# Patient Record
Sex: Male | Born: 1935
Health system: Southern US, Community
[De-identification: ages and names within clinical notes are randomized; demographics above are authoritative.]

## PROBLEM LIST (undated history)

## (undated) DIAGNOSIS — R7989 Other specified abnormal findings of blood chemistry: Secondary | ICD-10-CM

## (undated) DIAGNOSIS — K219 Gastro-esophageal reflux disease without esophagitis: Secondary | ICD-10-CM

## (undated) DIAGNOSIS — R42 Dizziness and giddiness: Secondary | ICD-10-CM

## (undated) DIAGNOSIS — T7840XA Allergy, unspecified, initial encounter: Secondary | ICD-10-CM

## (undated) DIAGNOSIS — D126 Benign neoplasm of colon, unspecified: Secondary | ICD-10-CM

## (undated) DIAGNOSIS — E785 Hyperlipidemia, unspecified: Secondary | ICD-10-CM

## (undated) DIAGNOSIS — H9190 Unspecified hearing loss, unspecified ear: Secondary | ICD-10-CM

## (undated) DIAGNOSIS — C801 Malignant (primary) neoplasm, unspecified: Secondary | ICD-10-CM

## (undated) HISTORY — DX: Dizziness and giddiness: R42

## (undated) HISTORY — DX: Unspecified hearing loss, unspecified ear: H91.90

## (undated) HISTORY — PX: EYE SURGERY: SHX253

## (undated) HISTORY — DX: Allergy, unspecified, initial encounter: T78.40XA

## (undated) HISTORY — DX: Malignant (primary) neoplasm, unspecified: C80.1

## (undated) HISTORY — PX: POLYPECTOMY: SHX149

## (undated) HISTORY — PX: APPENDECTOMY: SHX54

## (undated) HISTORY — DX: Other specified abnormal findings of blood chemistry: R79.89

## (undated) HISTORY — DX: Benign neoplasm of colon, unspecified: D12.6

## (undated) HISTORY — DX: Gastro-esophageal reflux disease without esophagitis: K21.9

## (undated) HISTORY — DX: Hyperlipidemia, unspecified: E78.5

## (undated) HISTORY — PX: PROSTATE SURGERY: SHX751

## (undated) HISTORY — PX: SPINE SURGERY: SHX786

## (undated) HISTORY — PX: COLONOSCOPY: SHX174

---

## 1998-04-06 ENCOUNTER — Ambulatory Visit (HOSPITAL_COMMUNITY): Admission: RE | Admit: 1998-04-06 | Discharge: 1998-04-06 | Payer: Self-pay | Admitting: *Deleted

## 1998-06-28 ENCOUNTER — Ambulatory Visit (HOSPITAL_COMMUNITY): Admission: RE | Admit: 1998-06-28 | Discharge: 1998-06-28 | Payer: Self-pay | Admitting: *Deleted

## 1998-07-12 ENCOUNTER — Ambulatory Visit (HOSPITAL_COMMUNITY): Admission: RE | Admit: 1998-07-12 | Discharge: 1998-07-12 | Payer: Self-pay | Admitting: *Deleted

## 1998-07-26 ENCOUNTER — Ambulatory Visit (HOSPITAL_COMMUNITY): Admission: RE | Admit: 1998-07-26 | Discharge: 1998-07-26 | Payer: Self-pay | Admitting: *Deleted

## 2001-03-31 DIAGNOSIS — D126 Benign neoplasm of colon, unspecified: Secondary | ICD-10-CM

## 2001-03-31 HISTORY — DX: Benign neoplasm of colon, unspecified: D12.6

## 2001-11-27 ENCOUNTER — Ambulatory Visit (HOSPITAL_COMMUNITY): Admission: RE | Admit: 2001-11-27 | Discharge: 2001-11-27 | Payer: Self-pay | Admitting: Internal Medicine

## 2001-11-28 ENCOUNTER — Inpatient Hospital Stay (HOSPITAL_COMMUNITY): Admission: RE | Admit: 2001-11-28 | Discharge: 2001-12-02 | Payer: Self-pay | Admitting: Internal Medicine

## 2001-11-28 ENCOUNTER — Encounter: Payer: Self-pay | Admitting: Internal Medicine

## 2001-11-28 ENCOUNTER — Encounter: Payer: Self-pay | Admitting: Orthopedic Surgery

## 2001-11-30 ENCOUNTER — Encounter (INDEPENDENT_AMBULATORY_CARE_PROVIDER_SITE_OTHER): Payer: Self-pay | Admitting: Cardiology

## 2003-09-15 ENCOUNTER — Encounter: Admission: RE | Admit: 2003-09-15 | Discharge: 2003-09-15 | Payer: Self-pay | Admitting: Family Medicine

## 2003-10-05 ENCOUNTER — Encounter: Admission: RE | Admit: 2003-10-05 | Discharge: 2003-10-05 | Payer: Self-pay | Admitting: Family Medicine

## 2004-04-16 ENCOUNTER — Ambulatory Visit: Admission: RE | Admit: 2004-04-16 | Discharge: 2004-07-11 | Payer: Self-pay | Admitting: Radiation Oncology

## 2004-04-22 ENCOUNTER — Ambulatory Visit: Payer: Self-pay | Admitting: Gastroenterology

## 2004-05-06 ENCOUNTER — Ambulatory Visit: Payer: Self-pay | Admitting: Gastroenterology

## 2004-05-13 ENCOUNTER — Encounter: Admission: RE | Admit: 2004-05-13 | Discharge: 2004-05-13 | Payer: Self-pay | Admitting: Urology

## 2004-06-05 ENCOUNTER — Ambulatory Visit (HOSPITAL_BASED_OUTPATIENT_CLINIC_OR_DEPARTMENT_OTHER): Admission: RE | Admit: 2004-06-05 | Discharge: 2004-06-05 | Payer: Self-pay | Admitting: Urology

## 2004-06-05 ENCOUNTER — Ambulatory Visit (HOSPITAL_COMMUNITY): Admission: RE | Admit: 2004-06-05 | Discharge: 2004-06-05 | Payer: Self-pay | Admitting: Urology

## 2004-08-08 ENCOUNTER — Ambulatory Visit: Admission: RE | Admit: 2004-08-08 | Discharge: 2004-08-08 | Payer: Self-pay | Admitting: Radiation Oncology

## 2005-08-26 ENCOUNTER — Encounter: Admission: RE | Admit: 2005-08-26 | Discharge: 2005-08-26 | Payer: Self-pay | Admitting: Family Medicine

## 2006-02-12 ENCOUNTER — Ambulatory Visit: Payer: Self-pay | Admitting: Family Medicine

## 2006-08-25 DIAGNOSIS — J310 Chronic rhinitis: Secondary | ICD-10-CM | POA: Insufficient documentation

## 2006-09-30 ENCOUNTER — Encounter (HOSPITAL_COMMUNITY): Admission: RE | Admit: 2006-09-30 | Discharge: 2006-10-05 | Payer: Self-pay | Admitting: Urology

## 2006-12-04 ENCOUNTER — Ambulatory Visit: Payer: Self-pay | Admitting: Cardiology

## 2006-12-08 ENCOUNTER — Inpatient Hospital Stay (HOSPITAL_BASED_OUTPATIENT_CLINIC_OR_DEPARTMENT_OTHER): Admission: RE | Admit: 2006-12-08 | Discharge: 2006-12-08 | Payer: Self-pay | Admitting: Cardiology

## 2006-12-08 ENCOUNTER — Ambulatory Visit: Payer: Self-pay | Admitting: Cardiology

## 2006-12-15 ENCOUNTER — Ambulatory Visit: Payer: Self-pay | Admitting: Cardiology

## 2007-01-07 ENCOUNTER — Telehealth (INDEPENDENT_AMBULATORY_CARE_PROVIDER_SITE_OTHER): Payer: Self-pay | Admitting: *Deleted

## 2007-01-07 ENCOUNTER — Ambulatory Visit: Payer: Self-pay | Admitting: Family Medicine

## 2007-01-07 DIAGNOSIS — J019 Acute sinusitis, unspecified: Secondary | ICD-10-CM | POA: Insufficient documentation

## 2007-04-26 ENCOUNTER — Ambulatory Visit: Payer: Self-pay | Admitting: Gastroenterology

## 2007-05-11 ENCOUNTER — Ambulatory Visit: Payer: Self-pay | Admitting: Gastroenterology

## 2007-05-11 ENCOUNTER — Encounter (INDEPENDENT_AMBULATORY_CARE_PROVIDER_SITE_OTHER): Payer: Self-pay | Admitting: Family Medicine

## 2007-05-11 ENCOUNTER — Encounter: Payer: Self-pay | Admitting: Gastroenterology

## 2010-08-13 NOTE — Assessment & Plan Note (Signed)
[facility]                            CARDIOLOGY OFFICE NOTE   OSEPH, IMBURGIA                       MRN:          [id number]  DATE:12/15/2006                            DOB:          [birth year]    Mr. Shouse returns today after having an outpatient cardiac  catheterization for exertional chest pain.   His cardiac cath only showed minor plaque.  He has normal left  ventricular function.   Dr. Perrin Maltese has checked his cholesterol and Mr. Hoffmeier says that he has  been told it is high.  I have suggested a treatment such a statin with  his known disease.  He should also continue on aspirin 81 mg a day.   He is still having intermittent, sharp pains.  I have reassured him this  is musculoskeletal or stress.   His blood pressure today is 118/61, his pulse 72 and regular, his weight  is 210.  HEENT:  Unchanged.  Carotid upstrokes were equal bilaterally without  bruits.  LUNGS:  Clear.  HEART:  Reveals a regular rate and rhythm without rub.  ABDOMINAL:  Soft.  GROIN:  Stable.  EXTREMITIES:  Reveal good pulses, there is no edema.  NEURO:  Exam is intact.   IMPRESSION:  Nonobstructive coronary artery disease.   RECOMMENDATIONS:  1. Statin per Dr. Perrin Maltese.  I have advised the patient to follow up with      Dr. Dyane Dustman concerning this.  This could make a huge difference in      future cardiovascular health.  2. Aspirin 81 mg a day.  3. See [place] back on a p.r.n. basis.     Thomas C. Daleen Squibb, MD, [facility]  Electronically Signed    TCW/MedQ  DD: 12/15/2006  DT: 12/15/2006  Job #: [id number]   cc:   Jonita Albee, M.D.

## 2010-08-13 NOTE — Consult Note (Signed)
NAME:  EARLY, STEEL NO.:  [id number]   MEDICAL RECORD NO.:  [id number]          PATIENT TYPE:  OIB   LOCATION:  1961                         FACILITY:  [facility]   PHYSICIAN:  Rollene Rotunda, MD, FACCDATE OF BIRTH:  [birth year]   DATE OF CONSULTATION:  12/08/2006  DATE OF DISCHARGE:                                 CONSULTATION   The primary is Dr. Robert Bellow, the cardiologist Dr. Daleen Squibb.   PROCEDURE:  Left heart catheterization/coronary arteriography.   INDICATIONS:  Evaluate patient with chest discomfort suggestive of  unstable angina.   PROCEDURE:  Left heart catheterization was performed via the right  femoral artery.  The artery was cannulated using anterior wall puncture.  A #4-French arterial sheath was inserted via the Seldinger technique.  Preformed Judkins and a pigtail catheter were utilized.  The patient  tolerated the procedure well and left the lab in stable condition.   RESULTS:  Hemodynamics:  LV 120/8, AO 126/91.   Coronaries:  Left main had luminal irregularities.  The LAD had diffuse  25% lesions.  This was a large vessel wrapping the apex.  The first and  second diagonal were small and normal.  The circumflex in the AV groove  had proximal 25% stenosis.  Ramus intermediate was large and normal.  There was long 25% stenosis.  Mid obtuse marginal was large with a long  25% stenosis.  The right coronary artery was a large dominant vessel.  It was normal throughout its course.  PDA was small to moderate size and  normal.  There were 2 small posterolaterals which were normal.  Left  ventriculogram was obtained in the RAO projection.  The EF was 65% with  normal wall motion.   CONCLUSION:  Mild coronary plaque.  Normal left ventricular function.   PLAN:  No further cardiac workup is suggested.  He will continue with  primary risk reduction.  He can continue with his primary care physician  to evaluate nonanginal causes of his chest  discomfort.      Rollene Rotunda, MD, [facility]  Electronically Signed     JH/MEDQ  D:  12/08/2006  T:  12/08/2006  Job:  [id number]   cc:   Jonita Albee, M.D.

## 2010-08-13 NOTE — Assessment & Plan Note (Signed)
[facility]                            CARDIOLOGY OFFICE NOTE   PHI, AVANS                       MRN:          [id number]  DATE:12/04/2006                            DOB:          1935-09-13    CHIEF COMPLAINT:  Tightness when I carry my trash up the hill.   HISTORY OF PRESENT ILLNESS:  Mr. Douglas [name] is a very pleasant 75-year-  old black male who comes in with the above complaint.  He saw Dr. Robert Bellow on December 02, 2006 and complained of exertional chest tightness.  This had been going on for about 7 months.  It is associated with  shortness of breath and getting sweaty.  It goes away with rest.  He has  not had any rest pain.   His cardiac risk factors include age, sex, unknown lipid status which  has just been drawn.  He does not smoke, never has.  Does not have  diabetes or hypertension.  There is no premature history of coronary  disease that he is aware of.   PAST MEDICAL HISTORY:  NO KNOWN ALLERGIES.  He is currently not allergic  to any medicines.   He is on:  1. Flomax 0.4 mg a day.  2. Multivitamin daily.  3. Aspirin 81 mg a day.   He does not smoke.  He does drink alcohol.   SURGICAL HISTORY:  1. Appendectomy.  2. Lower back surgery.  3. Prostate seed implants by Dr. Eudelia Bunch recently.   FAMILY HISTORY:  Unremarkable.   SOCIAL HISTORY:  He is retired from [employer].  He worked there  about 35 years.  He retired on May 30, 1998.   REVIEW OF SYSTEMS:  Other than the HPI is negative.  He does have some  occasional acid reflux and complains of some chronic arthritis.   EXAMINATION:  Blood pressure is 128/84, his pulse is 71 and regular.  EKG shows sinus rhythm with PACs, otherwise benign.  He is 5 foot 11,  weighs 204.  HEENT:  Normocephalic/atraumatic, PERRLA, extraocular movements intact,  sclerae are slightly muddy, facial symmetry is normal.  NECK:  Supple, carotid upstrokes were equal  bilaterally without bruits,  there is no JVD, thyroid is not enlarged, trachea is midline.  LUNGS:  Clear.  HEART:  Reveals a nondisplaced PMI, has normal S1 and S2 without gallop.  ABDOMINAL:  Soft, good bowel sounds, no midline bruit, no hepatomegaly.  EXTREMITIES:  Reveal no cyanosis, clubbing or edema.  Pulses are brisk.  NEURO:  Intact.  SKIN:  Unremarkable.   ASSESSMENT:  1. Exertional angina.  2. Minimal cardiac risk factors except for age and sex.  3. Unknown lipid status, pending.  4. History of prostate carcinoma.   PLAN:  Outpatient cardiac catheterization.  Indications, risks and  potential benefits were discussed with the patient, he agrees to  proceed.  We will schedule this for next Tuesday December 08, 2006.     Thomas C. Daleen Squibb, MD, [facility]  Electronically Signed    TCW/MedQ  DD: 12/04/2006  DT: 12/04/2006  Job #:  [id number]   cc:   Jonita Albee, M.D.

## 2010-08-16 NOTE — Op Note (Signed)
   NAME:  Douglas [name], Douglas [name]                          ACCOUNT NO.:  [id number]   MEDICAL RECORD NO.:  [id number]                   PATIENT TYPE:  INP   LOCATION:  0467                                 FACILITY:  [facility]   PHYSICIAN:  John L. Rendall III, M.D.           DATE OF BIRTH:  1936-01-19   DATE OF PROCEDURE:  11/28/2001  DATE OF DISCHARGE:                                 OPERATIVE REPORT   PREOPERATIVE DIAGNOSIS:  Abscess, right distal thigh laterally.   SURGICAL PROCEDURE:  I&D deep abscess, right leg above the knee -- 8 x 2 x 2  cm beneath the biceps femoris on top of the femur.   POSTOPERATIVE DIAGNOSIS:  Abscess, right distal thigh laterally.   SURGEON:  John L. Rendall, M.D.   ANESTHESIA:  General.   FINDINGS:  Thick, creamy milky white pus in a deep abscess, very deep in the  fat -- greater than one ounce was present.   PROCEDURE:  Under general anesthesia, the right leg is prepared with  Duraprep and draped as a sterile field, with a high thigh tourniquet.  The  leg was elevated and a tourniquet is used at 300 mmHg.  A 5 inch incision is  made, just on the medial side of the biceps femoris.  Dissection is carried  through skin and subcutaneous tissue.  The fascia of the biceps is entered.  Much edema is noted.  Blunt dissection around the biceps femoris is done,  and the muscle was retracted laterally.  Finger dissection is then done, and  edema fluid is encountered at first; as the blunt dissection is carried in  the intramuscular plane in the midline an abscess cavity is drained right on  top of the bone in the midline.  This is dissected upwards several inches,  and over one ounce of creamy white pus is drained.  It is sent for Gram-  stain CNS;  over 500 cc of warm saline irrigation used to wash this area  out.  A 0.5 inch Penrose drain is inserted.  The tourniquet is then let  down.  The Penrose drain is sutured to the skin.  The skin is left wide  open.  A Betadine  gauze packing is in place.  Skin stitches are placed, but  no pulled together.  I left to possibly be pulled together on the Ward, once  swelling goes down.  Sterile compression bandages are applied.  The patient  returns to recovery in good condition.                                               John L. Dorothyann Gibbs, M.D.    Renato Gails  D:  11/28/2001  T:  11/28/2001  Job:  [id number]

## 2010-08-16 NOTE — Op Note (Signed)
NAME:  Douglas [name], Douglas [name] NO.:  [id number]   MEDICAL RECORD NO.:  [id number]          PATIENT TYPE:  AMB   LOCATION:  [facility]                         FACILITY:  [facility]   PHYSICIAN:  Jamison Neighbor, M.D.  DATE OF BIRTH:  1936/03/29   DATE OF PROCEDURE:  06/05/2004  DATE OF DISCHARGE:                                 OPERATIVE REPORT   PREOPERATIVE DIAGNOSIS:  Carcinoma of the prostate.   POSTOPERATIVE DIAGNOSIS:  Carcinoma of the prostate.   PROCEDURE:  Radiation seed implantation plus cystoscopy.   SURGEON:  Jamison Neighbor, M.D.   ANESTHESIA:  General.   COMPLICATIONS:  None.   DRAINS:  Foley catheter.   BRIEF HISTORY:  A 75 year old male was found to have an elevated PSA and  underwent biopsy.  The patient was found to have carcinoma of the prostate.  He had Gleason score 6 involving 10% of the tissue on the left and high  grade PIN on the right.  The patient was given options for therapy including  watchful waiting, surgery, radiation therapy, radiation seed implantation,  cryosurgery, hormonal manipulation or chemotherapy.  Clearly it was felt  that given his young age and excellent performance status, that watchful  waiting is probably not the best approach but we did discuss the fact that  he had a low volume disease and there was a chance that he would be okay  with no therapy.  Because the patient does not have health problems, he did  want to do something definitive.  We spent most of our time discussing  radiation therapy and surgery.  The patient was seen and evaluated by Dr.  Chipper Herb and after careful discussion, the decision was made to proceed  with a seed implantation.  There was some thought that the patient might  require TURP or Alpha blockers prior to the procedure but the patient felt  like he was urinating reasonably well and decided to go ahead and  discontinue any medical therapy.  The patient is now to undergo seed  implantation.   He understands the risks and benefits of the procedure and  gave full informed consent.   DESCRIPTION OF PROCEDURE:  The patient was placed in the dorsal lithotomy  position in a somewhat exaggerated position.  The scrotum was pulled out of  the way.  A Foley catheter was inserted and that was all secured outside the  field.  He was then prepped and draped in the usual fashion.  The ultrasound  was used to define the prostate and make sure that the images obtained on  the ultrasound were identical from those to the preplanning session.  All  seeds appeared to be appropriately positioned on the template.  The  stabilizing needles were then inserted.  The patient underwent insertion of  a total of 16 needles with the total of 48 seeds.  These were utilizing  RAPID strand I-125 seeds.  The fluoroscopic image show that the seeds appear  to be very well positioned.  Cystoscopy was then performed.  The urethra was  visualized in its  entirety and found to be normal beyond the verumontanum.  There was prostatic enlargement.  The prostatic urethra was free of any  seeds and the bladder appeared to be normal as well.  There was minimal  trabeculation.  There was moderate prostatic enlargement with  some bladder outlet obstruction but no evidence of any seeds within the  bladder.  The patient had a Foley catheter reinserted. This will be placed  to straight drainage.  He was given a prescription for Lorcet Plus to take  as needed for pain, Septra DS to be taken twice daily and return to see me  in the office in follow-up.      RJE/MEDQ  D:  06/05/2004  T:  06/05/2004  Job:  [id number]   cc:   Maryln Gottron, M.D.  [address] [facility]  [place]  [place] [address]  Fax: [phone]   Mosetta Putt, M.D.  [address] [place]  [place] [address]  Fax: [phone]

## 2010-08-16 NOTE — H&P (Signed)
   NAME:  Douglas [name], Douglas [name]                          ACCOUNT NO.:  [id number]   MEDICAL RECORD NO.:  [id number]                   PATIENT TYPE:  INP   LOCATION:  0467                                 FACILITY:  [facility]   PHYSICIAN:  John L. Rendall III, M.D.           DATE OF BIRTH:  01/21/1936   DATE OF ADMISSION:  11/28/2001  DATE OF DISCHARGE:                                HISTORY & PHYSICAL   CHIEF COMPLAINT:  Abscess right leg.   HISTORY OF PRESENT ILLNESS:  The patient is a 75 year old black male who is  referred from [facility] with fever and right leg pain, much worse over  the last two days. He has been unable to bear weight on it for the last 24  hours and his white count yesterday was 16,000. Today, his leg is hurting  and he cannot extend the knee. MRI done as an outpatient shows an abscess  8x2x2 cm deep in the distal thigh, right on the femur, beneath the biceps  femoris.   MEDICATIONS:  Cardura, Tequin, and Rocephin.   ALLERGIES:  No known drug allergies.   PAST MEDICAL HISTORY:  Negative except for benign prostatic hypertrophy.   PAST SURGICAL HISTORY:  Negative except for back surgery times two.   REVIEW OF SYSTEMS:  Excellent health. No recent cold or fever but he has a  slight cough at this time.   PHYSICAL EXAMINATION:  GENERAL: Healthy muscular appearing 75 year old.  VITAL SIGNS: Pulse 84 and regular. Blood pressure 146/72.  HEENT: Negative.  CHEST: Clear.  HEART: No murmur, rub, or gallop.  ABDOMEN: Without mass or organomegaly.  GENITAL: Examination not done.  EXTREMITIES: Orthopedic examination reveals the patient lacks extension of  the right knee by 20 degrees. He has firmness and fullness in the  posterolateral distal right thigh. He has good pulses in the foot and good  sensation.   DIAGNOSTIC STUDIES:  MRI is reviewed with the radiologist. There is a 8x2x2  cm abscess running just deep to the biceps femoris on top of the femur. It  does not  enter the bone.   IMPRESSION:  Abscess right distal thigh.   DISPOSITION:  Proceed to surgical drainage tonight.                                               John L. Dorothyann Gibbs, M.D.    Renato Gails  D:  11/28/2001  T:  11/28/2001  Job:  [id number]

## 2010-08-16 NOTE — Discharge Summary (Signed)
NAME:  Douglas [name], Douglas [name]                          ACCOUNT NO.:  [id number]   MEDICAL RECORD NO.:  [id number]                   PATIENT TYPE:  INP   LOCATION:  0467                                 [facility]ACILITY[facility]  [facility]   PHYSICIAN:  John L. Rendall, M.D.               DATE OF BIRTH:  [birth year]   DATE OF ADMISSION:  11/28/2001  DATE OF DISCHARGE:  12/02/2001                                 DISCHARGE SUMMARY   ADMISSION DIAGNOSES:  1. An 8 x 2 x 2 cm abscess in the deep biceps femoris on the top of the     right femur.  2. Benign prostatic hypertrophy.   DISCHARGE DIAGNOSES:  1. Methicillin sensitive Staph aureus abscess right distal thigh.  2. Benign prostatic hypertrophy.   HISTORY OF PRESENT ILLNESS:  The patient is a 75 year old black male who was  referred from [facility] with a fever and right leg pain.  It has  significantly worsened over the last two days.  He was unable to bear weight  on the right leg over the last 24 hours.  His white count jumped to 16,000.  MRI of the right leg shows an 8 x 2 x 2 cm deep distal thigh abscess of the  biceps femoris.  The patient was admitted for surgical drainage.   ALLERGIES:  No known drug allergies.   CURRENT MEDICATIONS:  1. Cardura.  2. Tequin.  3. Rocephin.   SURGICAL PROCEDURE:  On November 28, 2001 patient was taken to the OR by Dr.  Jonny Ruiz L. Rendall.  Under general anesthesia the patient underwent an I&D of  the deep abscess of the right leg above the knee beneath the biceps femoris,  but on top of the femur.  Copious amounts of thick creamy milky white pus  was removed from the deep abscess.  The wound was left open.  Penrose drains  were left in place.  Betadine packing was also placed.  Stitches were  placed, but not pulled together.  The patient tolerated the procedure well.  Was transferred to recovery room, then to the orthopedic floor in good  condition.   HOSPITAL COURSE:  The patient was admitted to [facility] on November 28, 2001 under the care of Dr. Jonny Ruiz L. Rendall.  The patient was taken to  the OR for an I&D of his right thigh abscess.  The patient tolerated this  procedure well.  Was transferred to recovery room and then to the orthopedic  floor for routine postoperative care.  An infectious disease consult was  requested for evaluation of the patient's infection and antibiotic options.   The patient's wound improved in appearance on a daily basis.  He had no  worsening of infection such as purulence, erythema, or increased white  count.  His vital signs remained stable.  His white count did drop to 11.1  on his  date of discharge.  The patient remained very comfortable with  initial PCA, but then changing over to p.o. pain medications.  The patient's  culture showed the Staph aureus was sensitive to Ancef so he was  transitioned over to Ancef IV which he tolerated well and then over to  Keflex p.o. which he also tolerated well.  The patient also had an  echocardiogram performed for evaluation of the source of infection and the  impression was overall left ventricular systolic function was normal.  Ejection fraction was estimated 55-60%.  No diagnostic left ventricular wall  abnormality was identified and left ventricular wall thickness was mildly  increased.  Right ventricular size was at its upper limits.  No likely  source for audible heart murmur was apparent.  No vegetation was identified  and the sensitivity of the study for the latter would be fair at best per  Dr. Aggie Cosier.   On postoperative day number four the patient was very comfortable.  His  wound remained to be significantly improved and it was felt at that point  the patient was ready to be discharged to home with daily wet to dry  dressing changes and to follow-up on the previous week with Dr. Priscille Kluver.   LABORATORIES:  CBC on September 4:  WBC 11.1 down from a high of 14.6,  hemoglobin 11.8, hematocrit 34.9,  platelets 438,000.  Routine urinalysis on  admission found 15 ketones, 30 protein, 2.0 urobilinogen, few bacteria.  Blood cultures:  No growth after five days.  Wound culture of the right  thigh was Staph aureus sensitive to all antibiotics.   DISCHARGE MEDICATIONS:  1. Percocet one or two tablets q.4-6h. p.r.n.  2. Restoril 30 mg p.o. q.h.s. p.r.n.  3. Robaxin 500 mg p.o. q.6h. p.r.n.  4. Keflex 500 mg p.o. b.i.d.   DISCHARGE INSTRUCTIONS:  Medications:  Keflex 500 mg b.i.d. for 10 days.  Percocet 5 mg one or two tablets q.4-6h. for pain as needed.  Activity as  tolerated.  Wound care:  Keep dressing clean.  Home health R.N. for wet to  dry dressing changes on the following Saturday and Monday.   FOLLOW UP:  The patient should call [phone] for a follow-up appointment the  following Tuesday with Dr. Priscille Kluver.   CONDITION ON DISCHARGE:  Improved and good.     Jamelle Rushing, P.A.                      John L. Priscille Kluver, M.D.    RWK/MEDQ  D:  12/14/2001  T:  12/14/2001  Job:  [id number]

## 2011-03-30 ENCOUNTER — Ambulatory Visit (INDEPENDENT_AMBULATORY_CARE_PROVIDER_SITE_OTHER): Payer: Medicare Other

## 2011-03-30 DIAGNOSIS — R079 Chest pain, unspecified: Secondary | ICD-10-CM

## 2011-03-30 DIAGNOSIS — H6980 Other specified disorders of Eustachian tube, unspecified ear: Secondary | ICD-10-CM

## 2011-04-24 DIAGNOSIS — R079 Chest pain, unspecified: Secondary | ICD-10-CM | POA: Diagnosis not present

## 2011-04-24 DIAGNOSIS — N400 Enlarged prostate without lower urinary tract symptoms: Secondary | ICD-10-CM | POA: Diagnosis not present

## 2011-04-24 DIAGNOSIS — E782 Mixed hyperlipidemia: Secondary | ICD-10-CM | POA: Diagnosis not present

## 2011-05-16 DIAGNOSIS — I4949 Other premature depolarization: Secondary | ICD-10-CM | POA: Diagnosis not present

## 2011-05-16 DIAGNOSIS — I491 Atrial premature depolarization: Secondary | ICD-10-CM | POA: Diagnosis not present

## 2011-05-16 DIAGNOSIS — R079 Chest pain, unspecified: Secondary | ICD-10-CM | POA: Diagnosis not present

## 2011-05-30 ENCOUNTER — Ambulatory Visit (INDEPENDENT_AMBULATORY_CARE_PROVIDER_SITE_OTHER): Payer: Medicare Other | Admitting: Family Medicine

## 2011-05-30 DIAGNOSIS — E785 Hyperlipidemia, unspecified: Secondary | ICD-10-CM | POA: Diagnosis not present

## 2011-05-30 DIAGNOSIS — C61 Malignant neoplasm of prostate: Secondary | ICD-10-CM | POA: Insufficient documentation

## 2011-05-30 DIAGNOSIS — J309 Allergic rhinitis, unspecified: Secondary | ICD-10-CM | POA: Diagnosis not present

## 2011-05-30 DIAGNOSIS — E291 Testicular hypofunction: Secondary | ICD-10-CM

## 2011-05-30 DIAGNOSIS — J329 Chronic sinusitis, unspecified: Secondary | ICD-10-CM | POA: Diagnosis not present

## 2011-05-30 MED ORDER — FLUTICASONE PROPIONATE 50 MCG/ACT NA SUSP: 2.0000 | Freq: Every day | NASAL | Status: DC

## 2011-05-30 MED ORDER — AMOXICILLIN 875 MG PO TABS: 875.0000 mg | ORAL_TABLET | Freq: Two times a day (BID) | ORAL | Status: AC

## 2011-05-30 NOTE — Progress Notes (Signed)
  Subjective:    Patient ID: Douglas [name], male    DOB: 1936/03/29, 77 y.o.   MRN: [id number]  Sinusitis This is a new (symptoms started 10 days ago; has worsened over the last 3 days) problem. The current episode started 1 to 4 weeks ago. The problem has been gradually worsening since onset. There has been no fever. His pain is at a severity of 4/10. The pain is mild. Associated symptoms include chills, congestion, coughing (worse as night) and sinus pressure. Pertinent negatives include no sore throat. Past treatments include oral decongestants. The treatment provided no relief.      Review of Systems  Constitutional: Positive for chills.  [name]: Positive for congestion and sinus pressure. Negative for sore throat.   Respiratory: Positive for cough (worse as night).    PMH/ HTN    Objective:   Physical Exam  Constitutional: He appears well-developed and well-nourished.  [name]:  Right Ear: Tympanic membrane is retracted.  Left Ear: Tympanic membrane is retracted.  Nose: Mucosal edema and rhinorrhea present.  Mouth/Throat: Posterior oropharyngeal erythema: purulent pnd.  Neck: Neck supple.  Cardiovascular: Normal rate, regular rhythm and normal heart sounds.   Pulmonary/Chest: Effort normal and breath sounds normal.  Lymphadenopathy:    He has cervical adenopathy.  Neurological: He is alert.  Skin: Skin is warm.          Assessment & Plan:   1. Sinusitis  amoxicillin (AMOXIL) 875 MG tablet  2. Allergic rhinitis  fluticasone (FLONASE) 50 MCG/ACT nasal spray  3. Prostate cancer    4. Dyslipidemia    5. Hypogonadism male     Anticipatory guidance. Follow up if symptoms persist or worsen

## 2011-07-16 ENCOUNTER — Other Ambulatory Visit: Payer: Self-pay | Admitting: Physician Assistant

## 2011-08-05 ENCOUNTER — Ambulatory Visit: Payer: Medicare Other

## 2011-08-05 ENCOUNTER — Ambulatory Visit (INDEPENDENT_AMBULATORY_CARE_PROVIDER_SITE_OTHER): Payer: Medicare Other | Admitting: Internal Medicine

## 2011-08-05 VITALS — BP 133/67 | HR 76 | Temp 97.9°F | Resp 16 | Ht 71.0 in | Wt 195.0 lb

## 2011-08-05 DIAGNOSIS — M545 Low back pain: Secondary | ICD-10-CM

## 2011-08-05 LAB — POCT URINALYSIS DIPSTICK
Bilirubin, UA: NEGATIVE
Glucose, UA: NEGATIVE
Ketones, UA: NEGATIVE
Leukocytes, UA: NEGATIVE
Nitrite, UA: NEGATIVE

## 2011-08-05 LAB — POCT CBC
Granulocyte percent: 48.1 %G (ref 37–80)
Hemoglobin: 15 g/dL (ref 14.1–18.1)
Lymph, poc: 3.3 (ref 0.6–3.4)
MID (cbc): 0.6 (ref 0–0.9)
MPV: 10 fL (ref 0–99.8)
POC Granulocyte: 3.6 (ref 2–6.9)
POC MID %: 8 %M (ref 0–12)
RBC: 4.78 M/uL (ref 4.69–6.13)
WBC: 7.5 10*3/uL (ref 4.6–10.2)

## 2011-08-05 LAB — POCT UA - MICROSCOPIC ONLY
Casts, Ur, LPF, POC: NEGATIVE
Crystals, Ur, HPF, POC: NEGATIVE
Epithelial cells, urine per micros: NEGATIVE
WBC, Ur, HPF, POC: NEGATIVE

## 2011-08-05 MED ORDER — HYDROCODONE-ACETAMINOPHEN 5-500 MG PO TABS: 1.0000 | ORAL_TABLET | Freq: Three times a day (TID) | ORAL | Status: AC | PRN

## 2011-08-05 NOTE — Patient Instructions (Signed)

## 2011-08-05 NOTE — Progress Notes (Signed)
Subjective:    Patient ID: Douglas [name], male    DOB: June 13, 1935, 76 y.o.   MRN: [id number]  HPI Has 1 week of lumbar mechanical pain started after exercising and came on gradually. No radiation, incontinance, weakness or numbness. Has hx of prostate cancer txed with radiation, states all f/ups PSA is good Also has hx of 2 lumbar surgeries in past.   Review of Systems     Objective:   Physical Exam  Constitutional: He is oriented to person, place, and time. He appears well-developed and well-nourished. He appears distressed.  Eyes: EOM are normal.  Neck: Normal range of motion.  Pulmonary/Chest: Effort normal.  Musculoskeletal:       Lumbar back: He exhibits decreased range of motion, tenderness, bony tenderness, pain and spasm. He exhibits no swelling and no deformity.       Able to walk upright Painful to flex , extend, rotate Occ goes into spasm  Neurological: He is alert and oriented to person, place, and time. He displays abnormal reflex. He exhibits normal muscle tone. Coordination abnormal.  Skin: Skin is dry and intact. No rash noted.  Psychiatric: He has a normal mood and affect. His behavior is normal. Judgment normal.   [facility] reading (PRIMARY) by  Dr.Dewey Viens. No mets seen, DDD at L5-S1, mild DJD  Results for orders placed in visit on 08/05/11  POCT CBC      Component Value Range   WBC 7.5  4.6 - 10.2 (K/uL)   Lymph, poc 3.3  0.6 - 3.4    POC LYMPH PERCENT 43.9  10 - 50 (%L)   MID (cbc) 0.6  0 - 0.9    POC MID % 8.0  0 - 12 (%M)   POC Granulocyte 3.6  2 - 6.9    Granulocyte percent 48.1  37 - 80 (%G)   RBC 4.78  4.69 - 6.13 (M/uL)   Hemoglobin 15.0  14.1 - 18.1 (g/dL)   HCT, POC 82.9  56.2 - 53.7 (%)   MCV 95.9  80 - 97 (fL)   MCH, POC 31.4 (*) 27 - 31.2 (pg)   MCHC 32.8  31.8 - 35.4 (g/dL)   RDW, POC 13.0     Platelet Count, POC 178  142 - 424 (K/uL)   MPV 10.0  0 - 99.8 (fL)  POCT UA - MICROSCOPIC ONLY      Component Value Range   WBC, Ur, HPF, POC neg      RBC, urine, microscopic neg     Bacteria, U Microscopic neg     Mucus, UA neg     Epithelial cells, urine per micros neg     Crystals, Ur, HPF, POC neg     Casts, Ur, LPF, POC neg     Yeast, UA neg    POCT URINALYSIS DIPSTICK      Component Value Range   Color, UA yellow     Clarity, UA clear     Glucose, UA neg     Bilirubin, UA neg     Ketones, UA neg     Spec Grav, UA 1.020     Blood, UA neg     pH, UA 5.5     Protein, UA neg     Urobilinogen, UA 0.2     Nitrite, UA neg     Leukocytes, UA Negative            Assessment & Plan:  LBP, ? Strain Rest, back care Vicodin 5/500

## 2011-09-01 ENCOUNTER — Ambulatory Visit (INDEPENDENT_AMBULATORY_CARE_PROVIDER_SITE_OTHER): Payer: Medicare Other | Admitting: Internal Medicine

## 2011-09-01 ENCOUNTER — Encounter: Payer: Self-pay | Admitting: Internal Medicine

## 2011-09-01 VITALS — BP 118/59 | HR 64 | Temp 97.5°F | Resp 16 | Ht 71.0 in | Wt 199.2 lb

## 2011-09-01 DIAGNOSIS — Z0000 Encounter for general adult medical examination without abnormal findings: Secondary | ICD-10-CM

## 2011-09-01 DIAGNOSIS — M545 Low back pain: Secondary | ICD-10-CM

## 2011-09-01 DIAGNOSIS — E789 Disorder of lipoprotein metabolism, unspecified: Secondary | ICD-10-CM | POA: Diagnosis not present

## 2011-09-01 DIAGNOSIS — C61 Malignant neoplasm of prostate: Secondary | ICD-10-CM

## 2011-09-01 DIAGNOSIS — Z125 Encounter for screening for malignant neoplasm of prostate: Secondary | ICD-10-CM | POA: Diagnosis not present

## 2011-09-01 DIAGNOSIS — E785 Hyperlipidemia, unspecified: Secondary | ICD-10-CM | POA: Diagnosis not present

## 2011-09-01 DIAGNOSIS — Z79899 Other long term (current) drug therapy: Secondary | ICD-10-CM | POA: Diagnosis not present

## 2011-09-01 LAB — CBC WITH DIFFERENTIAL/PLATELET
Basophils Relative: 0 % (ref 0–1)
Eosinophils Absolute: 0.2 10*3/uL (ref 0.0–0.7)
HCT: 39.5 % (ref 39.0–52.0)
MCH: 30.7 pg (ref 26.0–34.0)
MCHC: 33.9 g/dL (ref 30.0–36.0)
MCV: 90.4 fL (ref 78.0–100.0)
Monocytes Relative: 6 % (ref 3–12)
Neutro Abs: 2.4 10*3/uL (ref 1.7–7.7)
Neutrophils Relative %: 42 % — ABNORMAL LOW (ref 43–77)
Platelets: 177 10*3/uL (ref 150–400)

## 2011-09-01 LAB — COMPREHENSIVE METABOLIC PANEL
AST: 16 U/L (ref 0–37)
Alkaline Phosphatase: 52 U/L (ref 39–117)
BUN: 13 mg/dL (ref 6–23)
CO2: 27 mEq/L (ref 19–32)
Calcium: 9.5 mg/dL (ref 8.4–10.5)
Chloride: 104 mEq/L (ref 96–112)
Creat: 1.13 mg/dL (ref 0.50–1.35)
Glucose, Bld: 112 mg/dL — ABNORMAL HIGH (ref 70–99)
Total Bilirubin: 0.5 mg/dL (ref 0.3–1.2)

## 2011-09-01 LAB — POCT GLYCOSYLATED HEMOGLOBIN (HGB A1C): Hemoglobin A1C: 6

## 2011-09-01 LAB — POCT URINALYSIS DIPSTICK
Glucose, UA: NEGATIVE
Leukocytes, UA: NEGATIVE
Spec Grav, UA: 1.02
pH, UA: 5.5

## 2011-09-01 LAB — GLUCOSE, POCT (MANUAL RESULT ENTRY): POC Glucose: 135 mg/dl — AB (ref 70–99)

## 2011-09-01 LAB — POCT UA - MICROSCOPIC ONLY: RBC, urine, microscopic: NEGATIVE

## 2011-09-01 LAB — LIPID PANEL
Cholesterol: 113 mg/dL (ref 0–200)
LDL Cholesterol: 59 mg/dL (ref 0–99)
Total CHOL/HDL Ratio: 2.8 Ratio
Triglycerides: 69 mg/dL (ref <150)
VLDL: 14 mg/dL (ref 0–40)

## 2011-09-01 LAB — TSH: TSH: 1.439 u[IU]/mL (ref 0.350–4.500)

## 2011-09-01 NOTE — Progress Notes (Signed)
  Subjective:    Patient ID: Douglas [name], male    DOB: Jul 16, 1935, 76 y.o.   MRN: [id number]  HPI LBP much improved, 80% well Lipids, low testosterone, and prostate cancer followed closely by urology. Not detectable. See scanned hx   Review of Systems See scanned ros    Objective:   Physical Exam  Constitutional: He is oriented to person, place, and time. He appears well-developed and well-nourished.  [name]:  Right Ear: External ear normal.  Left Ear: External ear normal.  Nose: Nose normal.  Mouth/Throat: Oropharynx is clear and moist.  Eyes: EOM are normal. Pupils are equal, round, and reactive to light.  Neck: Normal range of motion. Neck supple. No thyromegaly present.  Cardiovascular: Normal rate, regular rhythm and normal heart sounds.   Pulmonary/Chest: Effort normal and breath sounds normal.  Abdominal: Soft. Bowel sounds are normal.  Genitourinary: Penis normal.  Musculoskeletal: Normal range of motion.  Lymphadenopathy:    He has no cervical adenopathy.  Neurological: He is alert and oriented to person, place, and time. He has normal reflexes. He exhibits normal muscle tone. Coordination normal.  Skin: Skin is warm and dry.  Psychiatric: He has a normal mood and affect. His behavior is normal. Judgment and thought content normal.   Results for orders placed in visit on 09/01/11  GLUCOSE, POCT (MANUAL RESULT ENTRY)      Component Value Range   POC Glucose 135 (*) 70 - 99 (mg/dl)  POCT UA - MICROSCOPIC ONLY      Component Value Range   WBC, Ur, HPF, POC neg     RBC, urine, microscopic neg     Bacteria, U Microscopic neg     Mucus, UA trace     Epithelial cells, urine per micros 0-2     Crystals, Ur, HPF, POC neg     Casts, Ur, LPF, POC neg     Yeast, UA neg    POCT URINALYSIS DIPSTICK      Component Value Range   Color, UA yellow     Clarity, UA clear     Glucose, UA neg     Bilirubin, UA neg     Ketones, UA neg     Spec Grav, UA 1.020     Blood, UA neg      pH, UA 5.5     Protein, UA neg     Urobilinogen, UA 0.2     Nitrite, UA neg     Leukocytes, UA Negative    POCT GLYCOSYLATED HEMOGLOBIN (HGB A1C)      Component Value Range   Hemoglobin A1C 6.0            Assessment & Plan:  All issues stable and controlled Refill meds one year

## 2011-09-03 LAB — PSA, MEDICARE: PSA: 0.13 ng/mL (ref <=4.00)

## 2011-09-04 ENCOUNTER — Encounter: Payer: Self-pay | Admitting: Family Medicine

## 2011-09-11 ENCOUNTER — Ambulatory Visit (INDEPENDENT_AMBULATORY_CARE_PROVIDER_SITE_OTHER): Payer: Medicare Other | Admitting: Family Medicine

## 2011-09-11 VITALS — BP 123/62 | HR 87 | Temp 98.1°F | Resp 16 | Ht 71.0 in | Wt 199.0 lb

## 2011-09-11 DIAGNOSIS — J40 Bronchitis, not specified as acute or chronic: Secondary | ICD-10-CM | POA: Diagnosis not present

## 2011-09-11 DIAGNOSIS — J329 Chronic sinusitis, unspecified: Secondary | ICD-10-CM | POA: Diagnosis not present

## 2011-09-11 MED ORDER — BENZONATATE 100 MG PO CAPS: ORAL_CAPSULE | ORAL | Status: AC

## 2011-09-11 MED ORDER — HYDROCODONE-HOMATROPINE 5-1.5 MG/5ML PO SYRP: 5.0000 mL | ORAL_SOLUTION | Freq: Three times a day (TID) | ORAL | Status: DC | PRN

## 2011-09-11 MED ORDER — AMOXICILLIN 875 MG PO TABS: 875.0000 mg | ORAL_TABLET | Freq: Two times a day (BID) | ORAL | Status: AC

## 2011-09-11 NOTE — Patient Instructions (Addendum)
Drink plenty of fluids  Take the antibiotic  Continue Flonase  Try Claritin D one daily

## 2011-09-11 NOTE — Progress Notes (Signed)
76 year old gentleman who has a history of recurrent episodes of sinusitis about every 3 months he says. For the past week or 10 days he has had a lot of drainage and congestion and coughing. When he lays down he gets congested. He's not been running a fever. He does not smoke. He is retired. He denies wheezing.  Objective: TMs are normal. Throat clear. Nose mildly congested. Neck supple without significant nodes no he feels like a tender node under his right side of his neck. Chest is clear to auscultation. Heart regular without murmurs. Mildly tender over his sinuses.  Assessment: Sinusitis/bronchitis  Plan: Amoxicillin Continue the Flonase Take Claritin-D one daily Return if worse.

## 2011-09-25 ENCOUNTER — Ambulatory Visit (INDEPENDENT_AMBULATORY_CARE_PROVIDER_SITE_OTHER): Payer: Medicare Other | Admitting: Family Medicine

## 2011-09-25 ENCOUNTER — Ambulatory Visit: Payer: Medicare Other

## 2011-09-25 VITALS — BP 117/67 | HR 81 | Temp 98.1°F | Resp 16 | Ht 71.0 in | Wt 196.0 lb

## 2011-09-25 DIAGNOSIS — J3489 Other specified disorders of nose and nasal sinuses: Secondary | ICD-10-CM

## 2011-09-25 DIAGNOSIS — J40 Bronchitis, not specified as acute or chronic: Secondary | ICD-10-CM

## 2011-09-25 DIAGNOSIS — R05 Cough: Secondary | ICD-10-CM

## 2011-09-25 DIAGNOSIS — R21 Rash and other nonspecific skin eruption: Secondary | ICD-10-CM

## 2011-09-25 DIAGNOSIS — R059 Cough, unspecified: Secondary | ICD-10-CM

## 2011-09-25 LAB — POCT CBC
Hemoglobin: 14 g/dL — AB (ref 14.1–18.1)
MCV: 99.7 fL — AB (ref 80–97)
MID (cbc): 0.6 (ref 0–0.9)
MPV: 9.3 fL (ref 0–99.8)
POC Granulocyte: 2 (ref 2–6.9)
POC LYMPH PERCENT: 50 %L (ref 10–50)
POC MID %: 11.9 %M (ref 0–12)
RBC: 4.59 M/uL — AB (ref 4.69–6.13)
RDW, POC: 14.1 %
WBC: 5.2 10*3/uL (ref 4.6–10.2)

## 2011-09-25 MED ORDER — HYDROCODONE-HOMATROPINE 5-1.5 MG/5ML PO SYRP: 5.0000 mL | ORAL_SOLUTION | Freq: Three times a day (TID) | ORAL | Status: AC | PRN

## 2011-09-25 MED ORDER — LEVOFLOXACIN 500 MG PO TABS: 500.0000 mg | ORAL_TABLET | Freq: Every day | ORAL | Status: AC

## 2011-09-25 MED ORDER — BENZONATATE 100 MG PO CAPS: ORAL_CAPSULE | ORAL | Status: AC

## 2011-09-25 MED ORDER — PREDNISONE 20 MG PO TABS: ORAL_TABLET | ORAL | Status: AC

## 2011-09-25 NOTE — Progress Notes (Signed)
Subjective: Patient is here with cough continue on. He was here couple of weeks ago and I treated him with antibiotics and cough medicine. The cough is continue to persist unabated. He is having to sleep on the couch because he disturbs his wife. He is not coughing up anything. Says he lays down he feels like the post nasal drainage structure bad cough. However he coughs a lot when he is upright also. He's not been fevers. He is not wheezing.  Objective: 76 year old Afro-American male in no major distress. He was coughing a lot. Her room. His TMs are normal. Throat clear. Neck supple with possible left lateral supraclavicular nodes, may just be the cervical rib arm feeling. Chest is clear to auscultation. Heart regular without murmurs.  Assessment: Postnasal sinus drainage Persistent cough  Plan: CBC, sinus x-ray, chest x-ray  [facility] reading (PRIMARY) by  Dr. Alwyn Ren Chest x-ray normal Left maxillary sinuses a little bit hazy  Results for orders placed in visit on 09/25/11  POCT CBC      Component Value Range   WBC 5.2  4.6 - 10.2 K/uL   Lymph, poc 2.6  0.6 - 3.4   POC LYMPH PERCENT 50.0  10 - 50 %L   MID (cbc) 0.6  0 - 0.9   POC MID % 11.9  0 - 12 %M   POC Granulocyte 2.0  2 - 6.9   Granulocyte percent 38.1  37 - 80 %G   RBC 4.59 (*) 4.69 - 6.13 M/uL   Hemoglobin 14.0 (*) 14.1 - 18.1 g/dL   HCT, POC 16.1  09.6 - 53.7 %   MCV 99.7 (*) 80 - 97 fL   MCH, POC 30.5  27 - 31.2 pg   MCHC 30.6 (*) 31.8 - 35.4 g/dL   RDW, POC 04.5     Platelet Count, POC 215  142 - 424 K/uL   MPV 9.3  0 - 99.8 fL      Impression": Sinusitis Bronchitis    levaquin Tessalon Hycodan prednisone

## 2011-09-25 NOTE — Patient Instructions (Signed)
Take meds as ordered  Return if not much better in 1-2 weeks

## 2011-10-30 DIAGNOSIS — H43399 Other vitreous opacities, unspecified eye: Secondary | ICD-10-CM | POA: Diagnosis not present

## 2011-10-30 DIAGNOSIS — H04129 Dry eye syndrome of unspecified lacrimal gland: Secondary | ICD-10-CM | POA: Diagnosis not present

## 2011-10-30 DIAGNOSIS — H40019 Open angle with borderline findings, low risk, unspecified eye: Secondary | ICD-10-CM | POA: Diagnosis not present

## 2011-10-30 DIAGNOSIS — Z961 Presence of intraocular lens: Secondary | ICD-10-CM | POA: Diagnosis not present

## 2011-10-30 DIAGNOSIS — H1045 Other chronic allergic conjunctivitis: Secondary | ICD-10-CM | POA: Diagnosis not present

## 2011-11-24 ENCOUNTER — Telehealth: Payer: Self-pay

## 2011-11-24 NOTE — Telephone Encounter (Signed)
PT STATES WE NEED TO CALL [facility] FOR A PRIOR AUTHORIZATION FOR HIS SIMVASTATIN. PLEASE CALL [phone]    [facility]

## 2011-11-25 MED ORDER — SIMVASTATIN 40 MG PO TABS: 40.0000 mg | ORAL_TABLET | Freq: Every day | ORAL | Status: DC

## 2011-11-25 NOTE — Telephone Encounter (Signed)
Called pt and he is not sure whether he needs a PA on his simvastatin or just a RF. Called [facility] to see what is needed and was told that only a RF is needed. Sending in RFs per protocol.

## 2011-12-25 ENCOUNTER — Ambulatory Visit (INDEPENDENT_AMBULATORY_CARE_PROVIDER_SITE_OTHER): Payer: Medicare Other

## 2011-12-25 DIAGNOSIS — Z23 Encounter for immunization: Secondary | ICD-10-CM | POA: Diagnosis not present

## 2012-02-17 DIAGNOSIS — C61 Malignant neoplasm of prostate: Secondary | ICD-10-CM | POA: Diagnosis not present

## 2012-03-08 ENCOUNTER — Encounter: Payer: Self-pay | Admitting: Internal Medicine

## 2012-03-08 ENCOUNTER — Ambulatory Visit (INDEPENDENT_AMBULATORY_CARE_PROVIDER_SITE_OTHER): Payer: Medicare Other | Admitting: Internal Medicine

## 2012-03-08 VITALS — BP 130/64 | HR 71 | Temp 98.2°F | Resp 16 | Ht 71.0 in | Wt 200.2 lb

## 2012-03-08 DIAGNOSIS — R7989 Other specified abnormal findings of blood chemistry: Secondary | ICD-10-CM

## 2012-03-08 DIAGNOSIS — C61 Malignant neoplasm of prostate: Secondary | ICD-10-CM | POA: Diagnosis not present

## 2012-03-08 DIAGNOSIS — E291 Testicular hypofunction: Secondary | ICD-10-CM

## 2012-03-08 DIAGNOSIS — E789 Disorder of lipoprotein metabolism, unspecified: Secondary | ICD-10-CM | POA: Diagnosis not present

## 2012-03-08 DIAGNOSIS — Z79899 Other long term (current) drug therapy: Secondary | ICD-10-CM | POA: Diagnosis not present

## 2012-03-08 LAB — COMPREHENSIVE METABOLIC PANEL
ALT: 26 U/L (ref 0–53)
AST: 18 U/L (ref 0–37)
Alkaline Phosphatase: 50 U/L (ref 39–117)
Potassium: 4.3 mEq/L (ref 3.5–5.3)
Sodium: 142 mEq/L (ref 135–145)
Total Bilirubin: 0.6 mg/dL (ref 0.3–1.2)
Total Protein: 7.1 g/dL (ref 6.0–8.3)

## 2012-03-08 NOTE — Progress Notes (Signed)
  Subjective:    Patient ID: Douglas [name], male    DOB: [birth year], 76 y.o.   MRN: [id number]  HPI Feels better on the testosterone gel. No problems with any meds. Lipids are controlled Has chronic low grade left shoulder pain, no function loss.    Review of Systems exercising    Objective:   Physical Exam  Constitutional: He is oriented to person, place, and time. He appears well-nourished.  Cardiovascular: Normal rate, regular rhythm, normal heart sounds and intact distal pulses.   Pulmonary/Chest: Effort normal and breath sounds normal.  Neurological: He is alert and oriented to person, place, and time. He exhibits normal muscle tone. Coordination normal.  Psychiatric: He has a normal mood and affect.   Left shoulder foll rom and strength, nms intact  Labs done Results for orders placed in visit on 09/25/11  POCT CBC      Component Value Range   WBC 5.2  4.6 - 10.2 K/uL   Lymph, poc 2.6  0.6 - 3.4   POC LYMPH PERCENT 50.0  10 - 50 %L   MID (cbc) 0.6  0 - 0.9   POC MID % 11.9  0 - 12 %M   POC Granulocyte 2.0  2 - 6.9   Granulocyte percent 38.1  37 - 80 %G   RBC 4.59 (*) 4.69 - 6.13 M/uL   Hemoglobin 14.0 (*) 14.1 - 18.1 g/dL   HCT, POC 82.9  56.2 - 53.7 %   MCV 99.7 (*) 80 - 97 fL   MCH, POC 30.5  27 - 31.2 pg   MCHC 30.6 (*) 31.8 - 35.4 g/dL   RDW, POC 13.0     Platelet Count, POC 215  142 - 424 K/uL   MPV 9.3  0 - 99.8 fL       Assessment & Plan:  RF meds 1 yr Inject shoulder if no improvement Shoulder rehab taught

## 2012-03-08 NOTE — Patient Instructions (Signed)
testoster Testosterone skin gel What is this medicine? TESTOSTERONE (tes TOS ter one) is the main male hormone. It supports normal male traits such as muscle growth, facial hair, and deep voice. This gel is used in males to treat low testosterone levels. This medicine may be used for other purposes; ask your health care provider or pharmacist if you have questions. What should I tell my health care provider before I take this medicine? They need to know if you have any of these conditions: -breast cancer -diabetes -heart disease -if a male partner is pregnant or trying to get pregnant -kidney disease -liver disease -lung disease -prostate cancer, enlargement -an unusual or allergic reaction to testosterone, soy proteins, other medicines, foods, dyes, or preservatives -pregnant or trying to get pregnant -breast-feeding How should I use this medicine? This medicine is for external use only. This medicine is applied at the same time every day (preferably in the morning) to clean, dry, intact skin. If you take a bath or shower in the morning, apply the gel after the bath or shower. Follow the directions on the prescription label. Make sure that you are using your testosterone gel product correctly and applying it only to the appropriate skin area (see below). Allow the skin to dry a few minutes then cover with clothing to prevent others from coming in contact with the medicine on your skin. The gel is flammable. Avoid fire, flame, or smoking until the gel has dried. Wash your hands with soap and water after use. For AndroGel Packets: Open the packet(s) needed for your dose. You can put the entire dose into your palm all at once or just a little at a time to apply. If you prefer, you can instead squeeze the gel directly onto the area you are applying it to. Apply on the shoulders, upper arm, or abdomen as directed. Do not apply to the scrotum or genitals. Be sure you use the correct total dose. It  is best to wait 5 to 6 hours after application of the gel before showering or swimming. For AndroGel 1%: Pump the dose into the palm of your hand. You can put the entire dose into your palm all at once or just a little at a time to apply. If you prefer, you can instead pump the gel directly onto the area you are applying it to. Apply on the shoulders, upper arm, or abdomen as directed. Do not apply to the scrotum or genitals. Be sure you use the correct total dose. It is best to wait for 5 to 6 hours after application of the gel before showering or swimming. For AndroGel 1.62%: Pump the dose into the palm of your hand. Dispense one pump of gel at a time into the palm of your hand before applying it. If you prefer, you can instead pump the gel directly onto the area you are applying it to. Apply on the shoulders and upper arms as directed. Do not apply to other parts of the body including the abdomen or genitals. Be sure you use the correct total dose. It is best to wait 2 hours after application of the gel before washing, showering, or swimming. For Testim: Open the tube(s) needed for your dose. Squeeze the gel from the tube into the palm of your hand. Apply on the shoulders or upper arms as directed. Do not apply to the scrotum, genitals, or abdomen. Be sure you use the correct total dose. Do not shower or swim for at least  2 hours after application of the gel. For Fortesta: Use the multi-dose pump to pump the gel directly onto the area you are applying it to. Apply on the thighs as directed. Do not apply to the abdomen, penis, scrotum, shoulders or upper arms. Gently rub the gel onto the skin using your finger. Be sure you use the correct total dose. Do not shower or swim for at least 2 hours after application of the gel. A special MedGuide will be given to you by the pharmacist with each prescription and refill. Be sure to read this information carefully each time. Talk to your pediatrician regarding the  use of this medicine in children. Special care may be needed. Overdosage: If you think you have taken too much of this medicine contact a poison control center or emergency room at once. NOTE: This medicine is only for you. Do not share this medicine with others. What if I miss a dose? If you miss a dose, use it as soon as you can. If it is almost time for your next dose, use only that dose. Do not use double or extra doses. What may interact with this medicine? -medicines for diabetes -medicines that treat or prevent blood clots like warfarin -oxyphenbutazone -propranolol -steroid medicines like prednisone or cortisone This list may not describe all possible interactions. Give your health care provider a list of all the medicines, herbs, non-prescription drugs, or dietary supplements you use. Also tell them if you smoke, drink alcohol, or use illegal drugs. Some items may interact with your medicine. What should I watch for while using this medicine? Visit your doctor or health care professional for regular checks on your progress. They will need to check the level of testosterone in your blood. This medicine can transfer from your body to others. If a person or pet comes in contact with the area where this medicine was applied to your skin, they may have a serious risk of side effects. If you cannot avoid skin-to-skin contact with another person, make sure the site where this medicine was applied is covered with clothing. If accidental contact happens, the skin of the person or pet should be washed right away with soap and water. Also, a male partner who is pregnant or trying to get pregnant should avoid contact with the gel or treated skin. This medicine may affect blood sugar levels. If you have diabetes, check with your doctor or health care professional before you change your diet or the dose of your diabetic medicine. This drug is banned from use in athletes by most athletic  organizations. What side effects may I notice from receiving this medicine? Side effects that you should report to your doctor or health care professional as soon as possible: -allergic reactions like skin rash, itching or hives, swelling of the face, lips, or tongue -breast enlargement -breathing problems -changes in mood, especially anger, depression, or rage -dark urine -general ill feeling or flu-like symptoms -light-colored stools -loss of appetite, nausea -nausea, vomiting -right upper belly pain -stomach pain -swelling of ankles -too frequent or persistent erections -trouble passing urine or change in the amount of urine -unusually weak or tired -yellowing of the eyes or skin Side effects that usually do not require medical attention (report to your doctor or health care professional if they continue or are bothersome): -acne -change in sex drive or performance -hair loss -headache This list may not describe all possible side effects. Call your doctor for medical advice about side effects. You  may report side effects to FDA at 1-800-FDA-1088. Where should I keep my medicine? Keep out of the reach of children. This medicine can be abused. Keep your medicine in a safe place to protect it from theft. Do not share this medicine with anyone. Selling or giving away this medicine is dangerous and against the law. Store at room temperature between 15 to 30 degrees C (59 to 86 degrees F). Keep closed until use. Protect from heat and light. This medicine is flammable. Avoid exposure to heat, fire, flame, and smoking. Throw away any unused medicine after the expiration date. NOTE: This sheet is a summary. It may not cover all possible information. If you have questions about this medicine, talk to your doctor, pharmacist, or health care provider.  2012, Elsevier/Gold Standard. (07/30/2009 4:45:50 PM)

## 2012-03-09 ENCOUNTER — Encounter: Payer: Self-pay | Admitting: *Deleted

## 2012-04-16 ENCOUNTER — Encounter: Payer: Self-pay | Admitting: Gastroenterology

## 2012-04-23 ENCOUNTER — Encounter: Payer: Self-pay | Admitting: Gastroenterology

## 2012-05-03 ENCOUNTER — Ambulatory Visit (INDEPENDENT_AMBULATORY_CARE_PROVIDER_SITE_OTHER): Payer: Medicare Other | Admitting: Family Medicine

## 2012-05-03 VITALS — BP 142/71 | HR 90 | Temp 98.9°F | Resp 20 | Ht 72.0 in | Wt 206.0 lb

## 2012-05-03 DIAGNOSIS — R05 Cough: Secondary | ICD-10-CM | POA: Diagnosis not present

## 2012-05-03 DIAGNOSIS — J329 Chronic sinusitis, unspecified: Secondary | ICD-10-CM | POA: Diagnosis not present

## 2012-05-03 DIAGNOSIS — R059 Cough, unspecified: Secondary | ICD-10-CM

## 2012-05-03 MED ORDER — HYDROCODONE-HOMATROPINE 5-1.5 MG/5ML PO SYRP: 5.0000 mL | ORAL_SOLUTION | Freq: Three times a day (TID) | ORAL | Status: DC | PRN

## 2012-05-03 MED ORDER — BENZONATATE 100 MG PO CAPS: 100.0000 mg | ORAL_CAPSULE | Freq: Three times a day (TID) | ORAL | Status: DC | PRN

## 2012-05-03 MED ORDER — CEFDINIR 300 MG PO CAPS: 300.0000 mg | ORAL_CAPSULE | Freq: Two times a day (BID) | ORAL | Status: DC

## 2012-05-03 NOTE — Patient Instructions (Addendum)
Use the omnicef antibiotic for your sinus infection. The tessalon perles are for cough- they will not make you drowsy.  The cough syrup will make you drowsy, so use this only at night or when you do not need to drive.  If you are not better in the next 2 or 3 days please let me know- Sooner if worse.

## 2012-05-03 NOTE — Progress Notes (Signed)
[facility] [address] [place] [address] [phone]  Date:  05/03/2012   Name:  Douglas [name]   DOB:  Apr 16, 1935   MRN:  [id number]  PCP:  Tally Due, MD    Chief Complaint: Sinus Problem and Headache   History of Present Illness:  Douglas [name] is a 77 y.o. very pleasant male patient who presents with the following:  He is here today with illness.  He has noted sinus symptoms for about 1.5 weeks.  He has noted nasal congestion, discolored nasal discharge, sinus pressure and pain.  He is sneezing and coughing.  He did have a ST, but this has resolved.   He has used claritin and gargled with hot salt water The cough is non- productive.   He has not noted a fever, but he is using tylenol for his symptoms No GI symptoms.   The worst symptoms is the sinus congestion and pain  NKDA  Patient Active Problem List  Diagnosis  . SINUSITIS- ACUTE-NOS  . RHINITIS  . Prostate cancer  . Dyslipidemia  . Hypogonadism male    Past Medical History  Diagnosis Date  . Cancer   . Hyperlipidemia   . Cataract   . Low testosterone     Past Surgical History  Procedure Date  . Spine surgery   . Appendectomy   . Eye surgery   . Prostate surgery     History  Substance Use Topics  . Smoking status: Never Smoker   . Smokeless tobacco: Not on file  . Alcohol Use: No    Family History  Problem Relation Age of Onset  . Cancer Father 23    pancreatic    No Known Allergies  Medication list has been reviewed and updated.  Current Outpatient Prescriptions on File Prior to Visit  Medication Sig Dispense Refill  . aspirin 81 MG tablet Take 81 mg by mouth daily.      . Cholecalciferol (VITAMIN D) 2000 UNITS tablet Take 2,000 Units by mouth daily.      . simvastatin (ZOCOR) 40 MG tablet Take 1 tablet (40 mg total) by mouth at bedtime.  90 tablet  1  . Tamsulosin HCl (FLOMAX) 0.4 MG CAPS Take 0.4 mg by mouth daily after supper.      . fluticasone  (FLONASE) 50 MCG/ACT nasal spray Place 2 sprays into the nose daily.  1 g  6  . testosterone (ANDRODERM) 2.5 MG/24HR Place 1 patch onto the skin daily.      Marland Kitchen testosterone (ANDROGEL) 50 MG/5GM GEL Place 5 g onto the skin daily.        Review of Systems:  As per HPI- otherwise negative. No sick contacts that he is aware of   Physical Examination: Filed Vitals:   05/03/12 0808  BP: 142/71  Pulse: 116  Temp: 98.9 F (37.2 C)  Resp: 20   Filed Vitals:   05/03/12 0808  Height: 6' (1.829 m)  Weight: 206 lb (93.441 kg)   Body mass index is 27.94 kg/(m^2). Ideal Body Weight: Weight in (lb) to have BMI = 25: 183.9   GEN: WDWN, NAD, Non-toxic, A & O x 3, mild overweight, well appearing  HEENT: Atraumatic, Normocephalic. Neck supple. No masses, No LAD. Bilateral TM wnl, oropharynx normal.  PEERL,EOMI.  Nasal cavity congested, sinuses tender to percussion Ears and Nose: No external deformity. CV: RRR, No M/G/R. No JVD. No thrill. No extra heart sounds. PULM: CTA B,  no wheezes, crackles, rhonchi. No retractions. No resp. distress. No accessory muscle use. ABD: S, NT, ND EXTR: No c/c/e NEURO Normal gait.  PSYCH: Normally interactive. Conversant. Not depressed or anxious appearing.  Calm demeanor.    Assessment and Plan: 1. Sinusitis  cefdinir (OMNICEF) 300 MG capsule  2. Cough  benzonatate (TESSALON) 100 MG capsule, HYDROcodone-homatropine (HYCODAN) 5-1.5 MG/5ML syrup   Sinus infection- treat with omnicef and for cough with tessalon and hycodan.  Let me know if not better in the next few days, Sooner if worse.     Abbe Amsterdam, MD

## 2012-05-18 ENCOUNTER — Ambulatory Visit (AMBULATORY_SURGERY_CENTER): Payer: Self-pay

## 2012-05-18 VITALS — Ht 71.0 in | Wt 205.0 lb

## 2012-05-18 DIAGNOSIS — Z860100 Personal history of colon polyps, unspecified: Secondary | ICD-10-CM

## 2012-05-18 MED ORDER — MOVIPREP 100 G PO SOLR: 1.0000 | Freq: Once | ORAL | Status: DC

## 2012-05-25 ENCOUNTER — Other Ambulatory Visit: Payer: Self-pay | Admitting: Radiology

## 2012-05-25 DIAGNOSIS — J309 Allergic rhinitis, unspecified: Secondary | ICD-10-CM

## 2012-05-25 MED ORDER — FLUTICASONE PROPIONATE 50 MCG/ACT NA SUSP: 2.0000 | Freq: Every day | NASAL | Status: DC

## 2012-05-25 NOTE — Telephone Encounter (Signed)
Pharmacy called to ask about patients flonase, ? Okay to refill

## 2012-06-01 ENCOUNTER — Encounter: Payer: Self-pay | Admitting: Gastroenterology

## 2012-06-03 ENCOUNTER — Other Ambulatory Visit: Payer: Self-pay | Admitting: Gastroenterology

## 2012-06-03 ENCOUNTER — Ambulatory Visit (AMBULATORY_SURGERY_CENTER): Payer: Medicare Other | Admitting: Gastroenterology

## 2012-06-03 ENCOUNTER — Encounter: Payer: Self-pay | Admitting: Gastroenterology

## 2012-06-03 VITALS — BP 125/82 | HR 59 | Temp 97.0°F | Resp 13 | Ht 71.0 in | Wt 205.0 lb

## 2012-06-03 DIAGNOSIS — Z1211 Encounter for screening for malignant neoplasm of colon: Secondary | ICD-10-CM

## 2012-06-03 DIAGNOSIS — D126 Benign neoplasm of colon, unspecified: Secondary | ICD-10-CM

## 2012-06-03 DIAGNOSIS — Z860100 Personal history of colon polyps, unspecified: Secondary | ICD-10-CM

## 2012-06-03 DIAGNOSIS — Z8546 Personal history of malignant neoplasm of prostate: Secondary | ICD-10-CM | POA: Diagnosis not present

## 2012-06-03 MED ORDER — SODIUM CHLORIDE 0.9 % IV SOLN: 500.0000 mL | INTRAVENOUS | Status: DC

## 2012-06-03 NOTE — Op Note (Signed)
[facility] [address] [place], [address]   COLONOSCOPY PROCEDURE REPORT  PATIENT: Douglas [name], Douglas [name]  MR#: [id number] BIRTHDATE: 10/08/1935 , 77  yrs. old GENDER: Male ENDOSCOPIST: Meryl Dare, MD, [facility] PROCEDURE DATE:  06/03/2012 PROCEDURE:   Colonoscopy with biopsy and snare polypectomy ASA CLASS:   Class II INDICATIONS:Patient's personal history of adenomatous colon polyps.  MEDICATIONS: MAC sedation, administered by CRNA and propofol (Diprivan) 270mg  IV DESCRIPTION OF PROCEDURE:   After the risks benefits and alternatives of the procedure were thoroughly explained, informed consent was obtained.  A digital rectal exam revealed no abnormalities of the rectum.   The LB [id number]  endoscope was introduced through the anus and advanced to the cecum, which was identified by both the appendix and ileocecal valve. No adverse events experienced.   The quality of the prep was good, using MoviPrep  The instrument was then slowly withdrawn as the colon was fully examined.  COLON FINDINGS: A sessile polyp measuring 4 mm in size was found at the hepatic flexure.  A polypectomy was performed with cold forceps.  The resection was complete and the polyp tissue was completely retrieved.   Two sessile polyps measuring 5-7 mm in size were found in the transverse colon.  A polypectomy was performed with a cold snare.  The resection was complete and the polyp tissue was completely retrieved.   A pedunculated polyp measuring 1 cm in size was found in the transverse colon.  A polypectomy was performed using snare cautery.  The resection was complete and the polyp tissue was completely retrieved.   The colon was otherwise normal.  There was no diverticulosis, inflammation, polyps or cancers unless previously stated.  Retroflexed views revealed internal hemorrhoids. The time to cecum=1 minutes 55 seconds. Withdrawal time=13 minutes 40 seconds.  The scope was  withdrawn and the procedure completed. COMPLICATIONS: There were no complications.  ENDOSCOPIC IMPRESSION: 1.   Sessile polyp measuring 4 mm at the hepatic flexure; polypectomy performed with cold forceps 2.   Two sessile polyps measuring 5-7 mm in the transverse colon; polypectomy performed with a cold snare 3.   Pedunculated polyp measuring 1 cm in the transverse colon; polypectomy performed using snare cautery 4.   Small internal hemorrhoids  RECOMMENDATIONS: 1.  Await pathology results 2.  Repeat colonoscopy in 3 years  eSigned:  Meryl Dare, MD, [facility] 06/03/2012 9:04 AM

## 2012-06-03 NOTE — Patient Instructions (Addendum)
Impressions/recommendations:  Polyps (handout given) Hemorrhoids (handout given)  Await pathology results. Repeat colonoscopy in 3 years.  YOU HAD AN ENDOSCOPIC PROCEDURE TODAY AT THE [facility]: Refer to the procedure report that was given to you for any specific questions about what was found during the examination.  If the procedure report does not answer your questions, please call your gastroenterologist to clarify.  If you requested that your care partner not be given the details of your procedure findings, then the procedure report has been included in a sealed envelope for you to review at your convenience later.  YOU SHOULD EXPECT: Some feelings of bloating in the abdomen. Passage of more gas than usual.  Walking can help get rid of the air that was put into your GI tract during the procedure and reduce the bloating. If you had a lower endoscopy (such as a colonoscopy or flexible sigmoidoscopy) you may notice spotting of blood in your stool or on the toilet paper. If you underwent a bowel prep for your procedure, then you may not have a normal bowel movement for a few days.  DIET: Your first meal following the procedure should be a light meal and then it is ok to progress to your normal diet.  A half-sandwich or bowl of soup is an example of a good first meal.  Heavy or fried foods are harder to digest and may make you feel nauseous or bloated.  Likewise meals heavy in dairy and vegetables can cause extra gas to form and this can also increase the bloating.  Drink plenty of fluids but you should avoid alcoholic beverages for 24 hours.  ACTIVITY: Your care partner should take you home directly after the procedure.  You should plan to take it easy, moving slowly for the rest of the day.  You can resume normal activity the day after the procedure however you should NOT DRIVE or use heavy machinery for 24 hours (because of the sedation medicines used during the test).    SYMPTOMS TO  REPORT IMMEDIATELY: A gastroenterologist can be reached at any hour.  During normal business hours, 8:30 AM to 5:00 PM Monday through Friday, call [phone].  After hours and on weekends, please call the GI answering service at [phone] who will take a message and have the physician on call contact you.   Following lower endoscopy (colonoscopy or flexible sigmoidoscopy):  Excessive amounts of blood in the stool  Significant tenderness or worsening of abdominal pains  Swelling of the abdomen that is new, acute  Fever of 100F or higher   FOLLOW UP: If any biopsies were taken you will be contacted by phone or by letter within the next 1-3 weeks.  Call your gastroenterologist if you have not heard about the biopsies in 3 weeks.  Our staff will call the home number listed on your records the next business day following your procedure to check on you and address any questions or concerns that you may have at that time regarding the information given to you following your procedure. This is a courtesy call and so if there is no answer at the home number and we have not heard from you through the emergency physician on call, we will assume that you have returned to your regular daily activities without incident.  SIGNATURES/CONFIDENTIALITY: You and/or your care partner have signed paperwork which will be entered into your electronic medical record.  These signatures attest to the fact that that the information above  on your After Visit Summary has been reviewed and is understood.  Full responsibility of the confidentiality of this discharge information lies with you and/or your care-partner.

## 2012-06-03 NOTE — Progress Notes (Signed)
Called to room to assist during endoscopic procedure.  Patient ID and intended procedure confirmed with present staff. Received instructions for my participation in the procedure from the performing physician.  

## 2012-06-03 NOTE — Progress Notes (Signed)
Patient did not experience any of the following events: a burn prior to discharge; a fall within the facility; wrong site/side/patient/procedure/implant event; or a hospital transfer or hospital admission upon discharge from the facility. (G8907) Patient did not have preoperative order for IV antibiotic SSI prophylaxis. (G8918)  

## 2012-06-03 NOTE — Progress Notes (Signed)
Stable to RR 

## 2012-06-04 ENCOUNTER — Telehealth: Payer: Self-pay

## 2012-06-04 NOTE — Telephone Encounter (Signed)
No answer

## 2012-06-08 ENCOUNTER — Encounter: Payer: Self-pay | Admitting: Gastroenterology

## 2012-06-08 DIAGNOSIS — H11159 Pinguecula, unspecified eye: Secondary | ICD-10-CM | POA: Diagnosis not present

## 2012-06-08 DIAGNOSIS — H04129 Dry eye syndrome of unspecified lacrimal gland: Secondary | ICD-10-CM | POA: Diagnosis not present

## 2012-06-08 DIAGNOSIS — H40029 Open angle with borderline findings, high risk, unspecified eye: Secondary | ICD-10-CM | POA: Diagnosis not present

## 2012-06-27 ENCOUNTER — Other Ambulatory Visit: Payer: Self-pay | Admitting: Family Medicine

## 2012-07-06 DIAGNOSIS — H833X9 Noise effects on inner ear, unspecified ear: Secondary | ICD-10-CM | POA: Diagnosis not present

## 2012-07-06 DIAGNOSIS — H9319 Tinnitus, unspecified ear: Secondary | ICD-10-CM | POA: Diagnosis not present

## 2012-07-06 DIAGNOSIS — H9209 Otalgia, unspecified ear: Secondary | ICD-10-CM | POA: Diagnosis not present

## 2012-07-26 ENCOUNTER — Other Ambulatory Visit: Payer: Self-pay | Admitting: Physician Assistant

## 2012-09-06 ENCOUNTER — Encounter: Payer: Medicare Other | Admitting: Internal Medicine

## 2012-09-21 ENCOUNTER — Telehealth: Payer: Self-pay

## 2012-09-21 MED ORDER — SIMVASTATIN 40 MG PO TABS: ORAL_TABLET | ORAL | Status: DC

## 2012-09-21 NOTE — Telephone Encounter (Signed)
PT STATES HE IS IN NEED OF HIS SIMVASTAIN, WAS TOLD BY THE DR WHEN HE NEEDED A REFILL TO GIVE [place] A CALL PLEASE CALL [phone]   [facility]

## 2012-11-02 DIAGNOSIS — H01009 Unspecified blepharitis unspecified eye, unspecified eyelid: Secondary | ICD-10-CM | POA: Diagnosis not present

## 2012-11-02 DIAGNOSIS — H04129 Dry eye syndrome of unspecified lacrimal gland: Secondary | ICD-10-CM | POA: Diagnosis not present

## 2012-11-02 DIAGNOSIS — H43399 Other vitreous opacities, unspecified eye: Secondary | ICD-10-CM | POA: Diagnosis not present

## 2012-11-02 DIAGNOSIS — H40159 Residual stage of open-angle glaucoma, unspecified eye: Secondary | ICD-10-CM | POA: Diagnosis not present

## 2012-11-02 DIAGNOSIS — H35039 Hypertensive retinopathy, unspecified eye: Secondary | ICD-10-CM | POA: Diagnosis not present

## 2012-11-02 DIAGNOSIS — Z961 Presence of intraocular lens: Secondary | ICD-10-CM | POA: Diagnosis not present

## 2012-12-07 ENCOUNTER — Ambulatory Visit (INDEPENDENT_AMBULATORY_CARE_PROVIDER_SITE_OTHER): Payer: Medicare Other | Admitting: Family Medicine

## 2012-12-07 DIAGNOSIS — Z23 Encounter for immunization: Secondary | ICD-10-CM | POA: Diagnosis not present

## 2012-12-21 ENCOUNTER — Other Ambulatory Visit: Payer: Self-pay | Admitting: Physician Assistant

## 2013-01-03 ENCOUNTER — Ambulatory Visit (INDEPENDENT_AMBULATORY_CARE_PROVIDER_SITE_OTHER): Payer: Medicare Other | Admitting: Internal Medicine

## 2013-01-03 ENCOUNTER — Encounter: Payer: Self-pay | Admitting: Internal Medicine

## 2013-01-03 VITALS — BP 138/78 | HR 68 | Temp 98.2°F | Resp 16 | Ht 71.0 in | Wt 200.0 lb

## 2013-01-03 DIAGNOSIS — E785 Hyperlipidemia, unspecified: Secondary | ICD-10-CM

## 2013-01-03 DIAGNOSIS — Z125 Encounter for screening for malignant neoplasm of prostate: Secondary | ICD-10-CM | POA: Diagnosis not present

## 2013-01-03 DIAGNOSIS — C61 Malignant neoplasm of prostate: Secondary | ICD-10-CM | POA: Diagnosis not present

## 2013-01-03 DIAGNOSIS — Z79899 Other long term (current) drug therapy: Secondary | ICD-10-CM | POA: Diagnosis not present

## 2013-01-03 DIAGNOSIS — Z0000 Encounter for general adult medical examination without abnormal findings: Secondary | ICD-10-CM

## 2013-01-03 LAB — CBC
HCT: 40.5 % (ref 39.0–52.0)
MCH: 32 pg (ref 26.0–34.0)
MCHC: 34.3 g/dL (ref 30.0–36.0)
MCV: 93.3 fL (ref 78.0–100.0)
Platelets: 181 10*3/uL (ref 150–400)
RBC: 4.34 MIL/uL (ref 4.22–5.81)
RDW: 13.3 % (ref 11.5–15.5)
WBC: 5.8 10*3/uL (ref 4.0–10.5)

## 2013-01-03 LAB — POCT URINALYSIS DIPSTICK
Bilirubin, UA: NEGATIVE
Glucose, UA: NEGATIVE
Ketones, UA: NEGATIVE
Nitrite, UA: NEGATIVE
Protein, UA: NEGATIVE
Urobilinogen, UA: 0.2

## 2013-01-03 LAB — TSH: TSH: 1.07 u[IU]/mL (ref 0.350–4.500)

## 2013-01-03 LAB — POCT UA - MICROSCOPIC ONLY
Casts, Ur, LPF, POC: NEGATIVE
Epithelial cells, urine per micros: NEGATIVE
Mucus, UA: POSITIVE
Yeast, UA: NEGATIVE

## 2013-01-03 LAB — COMPREHENSIVE METABOLIC PANEL
AST: 16 U/L (ref 0–37)
Alkaline Phosphatase: 56 U/L (ref 39–117)
BUN: 12 mg/dL (ref 6–23)
Calcium: 9.9 mg/dL (ref 8.4–10.5)
Chloride: 104 mEq/L (ref 96–112)
Creat: 1.08 mg/dL (ref 0.50–1.35)

## 2013-01-03 LAB — LIPID PANEL
HDL: 41 mg/dL (ref >39)
LDL Cholesterol: 64 mg/dL (ref 0–99)
Triglycerides: 91 mg/dL (ref <150)
VLDL: 18 mg/dL (ref 0–40)

## 2013-01-03 NOTE — Patient Instructions (Addendum)
Immunization Schedule, Adult  Influenza vaccine.  Adults should be given 1 dose every year.  Tetanus, diphtheria, and pertussis (Td, Tdap) vaccine.  Adults who have not previously been given Tdap or who do not know their vaccine status should be given 1 dose of Tdap.  Adults should have a Td booster every 10 years.  Doses should be given if needed to catch up on missed doses in the past.  Pregnant women should be given 1 dose of Tdap vaccine during each pregnancy.  Varicella vaccine.  All adults without evidence of immunity to varicella should receive 2 doses or a second dose if they have received only 1 dose.  Pregnant women who do not have evidence of immunity should be given the first dose after their pregnancy.  Human papillomavirus (HPV) vaccine.  Women aged 13 through 26 years who have not been given the vaccine previously should be given the 3 dose series. The second dose should be given 1 to 2 months after the first dose. The third dose should be given at least 24 weeks after the first dose.  The vaccine is not recommended for use in pregnant women. However, pregnancy testing is not needed before being given a dose. If a woman is found to be pregnant after being given a dose, no treatment is needed. In that case, the remaining doses should be delayed until after the pregnancy.  Men aged 13 through 21 years who have not been given the vaccine previously should be given the 3 dose series. Men aged 22 through 26 years may be given the 3 dose series. The second dose should be given 1 to 2 months after the first dose. The third dose should be given at least 24 weeks after the first dose.  Zoster vaccine.  One dose is recommended for adults aged 60 years and older unless certain conditions are present.  Measles, mumps, and rubella (MMR) vaccine.  Adults born before 1957 generally are considered immune to measles and mumps. Healthcare workers born before 1957 who do not have  evidence of immunity should consider vaccination.  Adults born in 1957 or later should have 1 or more doses of MMR vaccine unless there is a contraindication for the vaccine or they have evidence of immunity to the diseases. A second dose should be given at least 28 days after the first dose. Adults receiving certain types of previous vaccines should consider or be given vaccine doses.  For women of childbearing age, rubella immunity should be determined. If there is no evidence of immunity, women who are not pregnant should be vaccinated. If there is no evidence of immunity, women who are pregnant should delay vaccination until after their pregnancy.  Pneumococcal polysaccharide (PPSV23) vaccine.  All adults aged 65 years and older should be given 1 dose.  Adults younger than age 65 years who have certain medical conditions, who smoke cigarettes, who reside in nursing homes or long-term care facilities, or who have an unknown vaccination history should usually be given 1 or 2 doses of the vaccine.  Pneumococcal 13-valent conjugate (PVC13) vaccine.  Adults aged 19 years or older with certain medical conditions and an unknown or incomplete pneumococcal vaccination history should usually be given 1 dose of the vaccine. This dose may be in addition to a PPSV23 vaccine dose.  Meningococcal vaccine.  First-year college students up to age 21 years who are living in residence halls should be given a dose if they did not receive a dose on   or after their 16th birthday.  A dose should be given to microbiologists working with certain meningitis bacteria, military recruits, and people who travel to or live in countries with a high rate of meningitis.  One or 2 doses should be given to adults who have certain high-risk conditions.  Hepatitis A vaccine.  Adults who wish to be protected from this disease, who have certain high-risk conditions, who work with hepatitis A-infected animals, who work in  hepatitis A research labs, or who travel to or work in countries with a high rate of hepatitis A should be given the 2 dose series of the vaccine.  Adults who were previously unvaccinated and who anticipate close contact with an international adoptee during the first 60 days after arrival in the [place] from a country with a high rate of hepatitis A should be given the vaccine. The first dose of the 2 dose series should be given 2 or more weeks before the arrival of the adoptee.  Hepatitis B vaccine.  Adults who wish to be protected from this disease, who have certain high-risk conditions, who may be exposed to blood or other infectious body fluids, who are household contacts or sex partners of hepatitis B positive people, who are clients or workers in certain care facilities, or who travel to or work in countries with a high rate of hepatitis B should be given the 3 dose series of the vaccine. If you travel outside the [place], additional vaccines may be needed. The Centers for Disease Control and Prevention (CDC) provides information about the vaccines, medicines, and other measures necessary to prevent illness and injury during international travel. Visit the CDC website at www.cdc.gov/travel or call (800) CDC-INFO [800-232-4636]. You may also consult a travel clinic or your caregiver. Document Released: 06/07/2003 Document Revised: 06/09/2011 Document Reviewed: 05/02/2011 [employer] Patient Information 2014 [employer].  

## 2013-01-03 NOTE — Progress Notes (Signed)
  Subjective:    Patient ID: Douglas [name], male    DOB: 22-Jul-1935, 77 y.o.   MRN: [id number]  HPI Doing very well. Prostate cancer controlled. UTD all immunizations, cholesterol controlled. Hypogonadism txed and feels good. No problems with any meds.   Review of Systems  Constitutional: Negative.   [name]: Positive for postnasal drip.   Eyes: Negative.   Respiratory: Negative.   Cardiovascular: Negative.   Gastrointestinal: Negative.   Endocrine: Negative.   Genitourinary: Negative.   Musculoskeletal: Positive for back pain and arthralgias.  Skin: Negative.   Allergic/Immunologic: Negative.   Neurological: Negative.   Hematological: Negative.   Psychiatric/Behavioral: Negative.        Objective:   Physical Exam  Vitals reviewed. Constitutional: He is oriented to person, place, and time. He appears well-developed and well-nourished. No distress.  [name]:  Right Ear: External ear normal.  Left Ear: External ear normal.  Nose: Nose normal.  Mouth/Throat: Oropharynx is clear and moist.  Eyes: Conjunctivae and EOM are normal. Pupils are equal, round, and reactive to light.  Neck: Normal range of motion. Neck supple. No tracheal deviation present. No thyromegaly present.  Cardiovascular: Normal rate, regular rhythm, normal heart sounds and intact distal pulses.   No murmur heard. Pulmonary/Chest: Effort normal.  Abdominal: Soft. Bowel sounds are normal. He exhibits no mass. There is no tenderness.  Genitourinary: Rectum normal, prostate normal and penis normal.  Musculoskeletal: Normal range of motion. He exhibits tenderness.  Lymphadenopathy:    He has no cervical adenopathy.  Neurological: He is alert and oriented to person, place, and time. He has normal reflexes. No cranial nerve deficit. He exhibits normal muscle tone. Coordination normal.  Skin: No rash noted.  Psychiatric: He has a normal mood and affect. His behavior is normal. Judgment and thought content normal.    Results for orders placed in visit on 01/03/13  POCT UA - MICROSCOPIC ONLY      Result Value Range   WBC, Ur, HPF, POC 1-2     RBC, urine, microscopic 0-1     Bacteria, U Microscopic trace     Mucus, UA pos     Epithelial cells, urine per micros neg     Crystals, Ur, HPF, POC neg     Casts, Ur, LPF, POC neg     Yeast, UA neg    POCT URINALYSIS DIPSTICK      Result Value Range   Color, UA yellow     Clarity, UA clear     Glucose, UA neg     Bilirubin, UA neg     Ketones, UA neg     Spec Grav, UA 1.015     Blood, UA neg     pH, UA 6.5     Protein, UA neg     Urobilinogen, UA 0.2     Nitrite, UA neg     Leukocytes, UA Negative            Assessment & Plan:  Normal CPE RF meds 1 yr

## 2013-01-04 ENCOUNTER — Encounter: Payer: Self-pay | Admitting: Internal Medicine

## 2013-01-07 ENCOUNTER — Encounter: Payer: Self-pay | Admitting: Family Medicine

## 2013-02-10 DIAGNOSIS — H43399 Other vitreous opacities, unspecified eye: Secondary | ICD-10-CM | POA: Diagnosis not present

## 2013-02-10 DIAGNOSIS — H35039 Hypertensive retinopathy, unspecified eye: Secondary | ICD-10-CM | POA: Diagnosis not present

## 2013-02-10 DIAGNOSIS — H26499 Other secondary cataract, unspecified eye: Secondary | ICD-10-CM | POA: Diagnosis not present

## 2013-02-10 DIAGNOSIS — H40029 Open angle with borderline findings, high risk, unspecified eye: Secondary | ICD-10-CM | POA: Diagnosis not present

## 2013-02-15 DIAGNOSIS — E559 Vitamin D deficiency, unspecified: Secondary | ICD-10-CM | POA: Diagnosis not present

## 2013-02-15 DIAGNOSIS — N320 Bladder-neck obstruction: Secondary | ICD-10-CM | POA: Diagnosis not present

## 2013-02-15 DIAGNOSIS — N529 Male erectile dysfunction, unspecified: Secondary | ICD-10-CM | POA: Diagnosis not present

## 2013-02-15 DIAGNOSIS — M810 Age-related osteoporosis without current pathological fracture: Secondary | ICD-10-CM | POA: Diagnosis not present

## 2013-02-15 DIAGNOSIS — E291 Testicular hypofunction: Secondary | ICD-10-CM | POA: Diagnosis not present

## 2013-02-15 DIAGNOSIS — C61 Malignant neoplasm of prostate: Secondary | ICD-10-CM | POA: Diagnosis not present

## 2013-02-23 DIAGNOSIS — H26499 Other secondary cataract, unspecified eye: Secondary | ICD-10-CM | POA: Diagnosis not present

## 2013-03-28 ENCOUNTER — Other Ambulatory Visit: Payer: Self-pay | Admitting: Physician Assistant

## 2013-03-29 ENCOUNTER — Telehealth: Payer: Self-pay

## 2013-04-11 DIAGNOSIS — E559 Vitamin D deficiency, unspecified: Secondary | ICD-10-CM | POA: Diagnosis not present

## 2013-04-11 DIAGNOSIS — M810 Age-related osteoporosis without current pathological fracture: Secondary | ICD-10-CM | POA: Diagnosis not present

## 2013-05-25 NOTE — Telephone Encounter (Signed)
error 

## 2013-06-03 ENCOUNTER — Ambulatory Visit (INDEPENDENT_AMBULATORY_CARE_PROVIDER_SITE_OTHER): Payer: Medicare Other | Admitting: Family Medicine

## 2013-06-03 ENCOUNTER — Encounter: Payer: Self-pay | Admitting: Family Medicine

## 2013-06-03 VITALS — BP 124/76 | HR 98 | Temp 98.8°F | Resp 17 | Ht 71.0 in | Wt 204.0 lb

## 2013-06-03 DIAGNOSIS — R7309 Other abnormal glucose: Secondary | ICD-10-CM

## 2013-06-03 DIAGNOSIS — R197 Diarrhea, unspecified: Secondary | ICD-10-CM

## 2013-06-03 DIAGNOSIS — R739 Hyperglycemia, unspecified: Secondary | ICD-10-CM

## 2013-06-03 LAB — POCT CBC
Granulocyte percent: 49.7 %G (ref 37–80)
HEMATOCRIT: 43.6 % (ref 43.5–53.7)
HEMOGLOBIN: 13.7 g/dL — AB (ref 14.1–18.1)
Lymph, poc: 2 (ref 0.6–3.4)
MCH: 31.1 pg (ref 27–31.2)
MCHC: 31.4 g/dL — AB (ref 31.8–35.4)
MCV: 99 fL — AB (ref 80–97)
MID (cbc): 0.5 (ref 0–0.9)
MPV: 9.7 fL (ref 0–99.8)
POC Granulocyte: 2.4 (ref 2–6.9)
POC LYMPH PERCENT: 40.1 %L (ref 10–50)
POC MID %: 10.2 % (ref 0–12)
Platelet Count, POC: 168 10*3/uL (ref 142–424)
RBC: 4.4 M/uL — AB (ref 4.69–6.13)
RDW, POC: 13.5 %
WBC: 4.9 10*3/uL (ref 4.6–10.2)

## 2013-06-03 LAB — COMPREHENSIVE METABOLIC PANEL
ALT: 48 U/L (ref 0–53)
AST: 31 U/L (ref 0–37)
Albumin: 4.3 g/dL (ref 3.5–5.2)
Alkaline Phosphatase: 61 U/L (ref 39–117)
BUN: 11 mg/dL (ref 6–23)
CALCIUM: 9.5 mg/dL (ref 8.4–10.5)
CHLORIDE: 103 meq/L (ref 96–112)
CO2: 27 meq/L (ref 19–32)
CREATININE: 1.09 mg/dL (ref 0.50–1.35)
Glucose, Bld: 126 mg/dL — ABNORMAL HIGH (ref 70–99)
POTASSIUM: 4.6 meq/L (ref 3.5–5.3)
Sodium: 138 mEq/L (ref 135–145)
Total Bilirubin: 0.3 mg/dL (ref 0.2–1.2)
Total Protein: 7.8 g/dL (ref 6.0–8.3)

## 2013-06-03 LAB — POCT GLYCOSYLATED HEMOGLOBIN (HGB A1C): HEMOGLOBIN A1C: 6.7

## 2013-06-03 LAB — GLUCOSE, POCT (MANUAL RESULT ENTRY): POC GLUCOSE: 143 mg/dL — AB (ref 70–99)

## 2013-06-03 MED ORDER — ONDANSETRON 4 MG PO TBDP: ORAL_TABLET | ORAL | Status: DC

## 2013-06-03 MED ORDER — ONDANSETRON 4 MG PO TBDP
4.0000 mg | ORAL_TABLET | Freq: Once | ORAL | Status: AC
  Administered 2013-06-03: 4 mg via ORAL

## 2013-06-03 NOTE — Addendum Note (Signed)
Addended by: Ivor Reining on: 06/03/2013 12:02 PM   Modules accepted: Orders

## 2013-06-03 NOTE — Progress Notes (Signed)
Subjective: 78 year old gentleman who is here with LC a lot and taste the food up into his mouth is not vomiting. He feels bloated. He has had about 3 bowel movements today already, twice since he was here. Yesterday he said he had about 9. He is a little tender in his belly but has not been having severe abdominal pain. He continues to urinate okay. He didn't need anything yesterday. This is unusual for him. He does not have often bouts of diarrhea.  Objective: Healthy-appearing man. His throat clear. Neck supple. Chest clear to auscultation. Heart regular without murmurs. Abdomen has very active bowel sounds, soft without masses but has some generalized and mostly epigastric tenderness. One scar in the right lower quadrant from an old appendectomy.  Assessment: Gastroenteritis with diarrhea and belching  Plan: Glucose CBC and comprehensive metabolic panel Zofran 4 mg orally   Results for orders placed in visit on 06/03/13  POCT CBC      Result Value Ref Range   WBC 4.9  4.6 - 10.2 K/uL   Lymph, poc 2.0  0.6 - 3.4   POC LYMPH PERCENT 40.1  10 - 50 %L   MID (cbc) 0.5  0 - 0.9   POC MID % 10.2  0 - 12 %M   POC Granulocyte 2.4  2 - 6.9   Granulocyte percent 49.7  37 - 80 %G   RBC 4.40 (*) 4.69 - 6.13 M/uL   Hemoglobin 13.7 (*) 14.1 - 18.1 g/dL   HCT, POC 43.6  43.5 - 53.7 %   MCV 99.0 (*) 80 - 97 fL   MCH, POC 31.1  27 - 31.2 pg   MCHC 31.4 (*) 31.8 - 35.4 g/dL   RDW, POC 13.5     Platelet Count, POC 168  142 - 424 K/uL   MPV 9.7  0 - 99.8 fL  GLUCOSE, POCT (MANUAL RESULT ENTRY)      Result Value Ref Range   POC Glucose 143 (*) 70 - 99 mg/dl

## 2013-06-03 NOTE — Addendum Note (Signed)
Addended by: Ivor Reining on: 06/03/2013 12:23 PM   Modules accepted: Level of Service

## 2013-06-03 NOTE — Patient Instructions (Addendum)
Take over-the-counter Imodium one every 6 hours if needed for loose stools  Take the prescription for Zofran one every 6 hours if needed for nausea or vomiting  Drink plenty of fluids.  Eat only very bland food, such as crackers or toast or rice or bread, and then gradually advance up but avoid fried and fatty foods for a few days.

## 2013-06-04 ENCOUNTER — Encounter: Payer: Self-pay | Admitting: *Deleted

## 2013-06-30 DIAGNOSIS — H01009 Unspecified blepharitis unspecified eye, unspecified eyelid: Secondary | ICD-10-CM | POA: Diagnosis not present

## 2013-06-30 DIAGNOSIS — H40029 Open angle with borderline findings, high risk, unspecified eye: Secondary | ICD-10-CM | POA: Diagnosis not present

## 2013-06-30 DIAGNOSIS — H04129 Dry eye syndrome of unspecified lacrimal gland: Secondary | ICD-10-CM | POA: Diagnosis not present

## 2013-08-03 ENCOUNTER — Other Ambulatory Visit: Payer: Self-pay | Admitting: Family Medicine

## 2013-08-04 DIAGNOSIS — M25569 Pain in unspecified knee: Secondary | ICD-10-CM | POA: Diagnosis not present

## 2013-08-29 ENCOUNTER — Ambulatory Visit: Payer: Medicare Other

## 2013-08-29 ENCOUNTER — Ambulatory Visit (INDEPENDENT_AMBULATORY_CARE_PROVIDER_SITE_OTHER): Payer: Medicare Other | Admitting: Family Medicine

## 2013-08-29 VITALS — BP 154/80 | HR 79 | Temp 98.0°F | Resp 16 | Ht 72.0 in | Wt 200.0 lb

## 2013-08-29 DIAGNOSIS — R0781 Pleurodynia: Secondary | ICD-10-CM

## 2013-08-29 DIAGNOSIS — M25519 Pain in unspecified shoulder: Secondary | ICD-10-CM

## 2013-08-29 DIAGNOSIS — M25512 Pain in left shoulder: Secondary | ICD-10-CM

## 2013-08-29 DIAGNOSIS — R079 Chest pain, unspecified: Secondary | ICD-10-CM

## 2013-08-29 MED ORDER — METHOCARBAMOL 500 MG PO TABS: ORAL_TABLET | ORAL | Status: DC

## 2013-08-29 MED ORDER — NAPROXEN 500 MG PO TABS: 500.0000 mg | ORAL_TABLET | Freq: Two times a day (BID) | ORAL | Status: DC

## 2013-08-29 NOTE — Patient Instructions (Signed)
Take Robaxin in the morning, one in the afternoon, and one or 2 at bedtime as needed for muscle relaxants  Take naproxen 500 mg one twice daily with food for pain and inflammation  Return if worse at anytime or if not improving over the next week  Apply ice to shoulder and chest wall
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
## 2013-09-05 DIAGNOSIS — H35039 Hypertensive retinopathy, unspecified eye: Secondary | ICD-10-CM | POA: Diagnosis not present

## 2013-09-05 DIAGNOSIS — H43819 Vitreous degeneration, unspecified eye: Secondary | ICD-10-CM | POA: Diagnosis not present

## 2013-09-05 DIAGNOSIS — H40029 Open angle with borderline findings, high risk, unspecified eye: Secondary | ICD-10-CM | POA: Diagnosis not present

## 2013-09-05 DIAGNOSIS — Z961 Presence of intraocular lens: Secondary | ICD-10-CM | POA: Diagnosis not present

## 2013-09-16 ENCOUNTER — Ambulatory Visit (INDEPENDENT_AMBULATORY_CARE_PROVIDER_SITE_OTHER): Payer: Medicare Other | Admitting: Family Medicine

## 2013-09-16 ENCOUNTER — Ambulatory Visit (INDEPENDENT_AMBULATORY_CARE_PROVIDER_SITE_OTHER): Payer: Medicare Other

## 2013-09-16 DIAGNOSIS — R7309 Other abnormal glucose: Secondary | ICD-10-CM | POA: Diagnosis not present

## 2013-09-16 DIAGNOSIS — M5090 Cervical disc disorder, unspecified, unspecified cervical region: Secondary | ICD-10-CM

## 2013-09-16 DIAGNOSIS — R739 Hyperglycemia, unspecified: Secondary | ICD-10-CM

## 2013-09-16 DIAGNOSIS — R202 Paresthesia of skin: Secondary | ICD-10-CM

## 2013-09-16 DIAGNOSIS — M25511 Pain in right shoulder: Secondary | ICD-10-CM

## 2013-09-16 DIAGNOSIS — M25519 Pain in unspecified shoulder: Secondary | ICD-10-CM

## 2013-09-16 DIAGNOSIS — R209 Unspecified disturbances of skin sensation: Secondary | ICD-10-CM

## 2013-09-16 LAB — GLUCOSE, POCT (MANUAL RESULT ENTRY): POC GLUCOSE: 102 mg/dL — AB (ref 70–99)

## 2013-09-16 LAB — POCT GLYCOSYLATED HEMOGLOBIN (HGB A1C): Hemoglobin A1C: 6.5

## 2013-09-16 MED ORDER — PREDNISONE 20 MG PO TABS: ORAL_TABLET | ORAL | Status: DC
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
## 2013-09-16 NOTE — Patient Instructions (Signed)
Take the prednisone 3 pills daily for 2 days, then 2 daily for 2 days, then one daily for 2 days. These are best taken in the morning because in the evening will sometimes last up your sleep.  Followup if not doing better after this  Return in for 4 months to recheck on your sugars if your other issues are doing well. Come in anytime sooner if further problems

## 2013-10-12 ENCOUNTER — Ambulatory Visit (INDEPENDENT_AMBULATORY_CARE_PROVIDER_SITE_OTHER): Payer: Medicare Other | Admitting: Family Medicine

## 2013-10-12 VITALS — BP 132/70 | HR 67 | Temp 97.6°F | Resp 16 | Ht 71.0 in | Wt 201.0 lb

## 2013-10-12 DIAGNOSIS — R209 Unspecified disturbances of skin sensation: Secondary | ICD-10-CM | POA: Diagnosis not present

## 2013-10-12 DIAGNOSIS — R202 Paresthesia of skin: Secondary | ICD-10-CM

## 2013-10-12 DIAGNOSIS — M5412 Radiculopathy, cervical region: Secondary | ICD-10-CM | POA: Diagnosis not present

## 2013-10-12 NOTE — Patient Instructions (Signed)
MRI of the neck will be scheduled for you. If you do not hear regarding an appointment for this by early next week please call back.  Continue taking Tylenol and giving this more time.  Depending on the results of the MRI I may need to refer you to a neurologist.
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
## 2013-10-21 ENCOUNTER — Ambulatory Visit
Admission: RE | Admit: 2013-10-21 | Discharge: 2013-10-21 | Disposition: A | Discharge status: Home / Self Care | Payer: Medicare Other | Source: Ambulatory Visit | Attending: Family Medicine | Admitting: Family Medicine

## 2013-10-21 DIAGNOSIS — M4802 Spinal stenosis, cervical region: Secondary | ICD-10-CM | POA: Diagnosis not present

## 2013-10-21 DIAGNOSIS — M5030 Other cervical disc degeneration, unspecified cervical region: Secondary | ICD-10-CM | POA: Diagnosis not present

## 2013-10-25 DIAGNOSIS — M25569 Pain in unspecified knee: Secondary | ICD-10-CM | POA: Diagnosis not present

## 2013-10-26 ENCOUNTER — Other Ambulatory Visit: Payer: Self-pay | Admitting: Family Medicine

## 2013-11-01 ENCOUNTER — Telehealth: Payer: Self-pay

## 2013-11-01 DIAGNOSIS — M5412 Radiculopathy, cervical region: Secondary | ICD-10-CM

## 2013-11-01 NOTE — Telephone Encounter (Signed)
Referral has been entered.

## 2013-11-01 NOTE — Telephone Encounter (Signed)
Patient called to inquire on neurosurgery referral that it has been two weeks since he has not heard from anyone. He does not have a referral order placed yet. In imaging results it appears that after mri was reviewed by Dr. Linna Darner he wanted a neurosurgery referral  for patient. Please let me know if he still needs one or if he needs to return to clinic thank you!  Best: [phone]

## 2013-11-09 DIAGNOSIS — M5412 Radiculopathy, cervical region: Secondary | ICD-10-CM | POA: Diagnosis not present

## 2013-11-09 DIAGNOSIS — R030 Elevated blood-pressure reading, without diagnosis of hypertension: Secondary | ICD-10-CM | POA: Diagnosis not present

## 2013-11-09 DIAGNOSIS — Z6828 Body mass index (BMI) 28.0-28.9, adult: Secondary | ICD-10-CM | POA: Diagnosis not present

## 2013-11-30 DIAGNOSIS — M5412 Radiculopathy, cervical region: Secondary | ICD-10-CM | POA: Diagnosis not present

## 2013-11-30 DIAGNOSIS — Z6828 Body mass index (BMI) 28.0-28.9, adult: Secondary | ICD-10-CM | POA: Diagnosis not present

## 2013-11-30 DIAGNOSIS — R030 Elevated blood-pressure reading, without diagnosis of hypertension: Secondary | ICD-10-CM | POA: Diagnosis not present

## 2013-12-13 ENCOUNTER — Ambulatory Visit (INDEPENDENT_AMBULATORY_CARE_PROVIDER_SITE_OTHER): Payer: Medicare Other | Admitting: *Deleted

## 2013-12-13 DIAGNOSIS — Z23 Encounter for immunization: Secondary | ICD-10-CM | POA: Diagnosis not present

## 2014-01-02 ENCOUNTER — Other Ambulatory Visit: Payer: Self-pay | Admitting: Internal Medicine

## 2014-01-11 DIAGNOSIS — M5412 Radiculopathy, cervical region: Secondary | ICD-10-CM | POA: Diagnosis not present

## 2014-01-11 DIAGNOSIS — R030 Elevated blood-pressure reading, without diagnosis of hypertension: Secondary | ICD-10-CM | POA: Diagnosis not present

## 2014-01-11 DIAGNOSIS — Z6828 Body mass index (BMI) 28.0-28.9, adult: Secondary | ICD-10-CM | POA: Diagnosis not present

## 2014-01-16 ENCOUNTER — Ambulatory Visit: Payer: Medicare Other | Admitting: Internal Medicine

## 2014-01-27 ENCOUNTER — Ambulatory Visit (INDEPENDENT_AMBULATORY_CARE_PROVIDER_SITE_OTHER): Payer: Medicare Other | Admitting: Family Medicine

## 2014-01-27 VITALS — BP 136/68 | HR 73 | Temp 98.4°F | Resp 16 | Ht 71.0 in | Wt 204.0 lb

## 2014-01-27 DIAGNOSIS — M25511 Pain in right shoulder: Secondary | ICD-10-CM | POA: Diagnosis not present

## 2014-01-27 MED ORDER — DICLOFENAC SODIUM 75 MG PO TBEC: 75.0000 mg | DELAYED_RELEASE_TABLET | Freq: Two times a day (BID) | ORAL | Status: DC

## 2014-01-27 NOTE — Progress Notes (Signed)
Subjective: Patient is to have a lot of pain in his right shoulder. He saw the specialist regarding his neck, who did not feel like the pain was primarily coming from his neck. He continues to hurt in his shoulder, especially if he lays on it. No injuries.  Objective: Satisfactory range of motion of the right shoulder. He does have a little crepitance in it. The x-ray was done as ago and was normal.  Assessment: Chronic pain right shoulder  Plan: Patient requested a steroid injection, and I believe that is appropriate.  Plan:  Injected with 1 cc depomedrol 80 and 1 mL of 2% lidocaine. Patient tolerated procedure well. Done with sterile technique and a posterior approach.  Instructed patient to use some ice on it tonight. If it is flaring up into a hot joint. He is to return ASAP.

## 2014-01-27 NOTE — Patient Instructions (Addendum)
Apply a baggy of ice to the shoulder a couple of times tonight and again tomorrow to calm down any inflammation  Continue to work the shoulder around some tonight and tomorrow  Take diclofenac one twice daily for pain and inflammation and shoulder. Take with food.  Return if not improving

## 2014-01-30 ENCOUNTER — Other Ambulatory Visit: Payer: Self-pay | Admitting: Neurosurgery

## 2014-01-30 DIAGNOSIS — M5412 Radiculopathy, cervical region: Secondary | ICD-10-CM

## 2014-01-31 ENCOUNTER — Telehealth: Payer: Self-pay | Admitting: Family Medicine

## 2014-01-31 NOTE — Telephone Encounter (Signed)
Billing statements needed for date of service 08/29/2013. Authorization on file.  Thank you!

## 2014-02-11 ENCOUNTER — Other Ambulatory Visit: Payer: Self-pay | Admitting: Internal Medicine

## 2014-02-11 NOTE — Telephone Encounter (Signed)
NEEDS OFFICE VISIT.

## 2014-02-15 DIAGNOSIS — E559 Vitamin D deficiency, unspecified: Secondary | ICD-10-CM | POA: Diagnosis not present

## 2014-02-15 DIAGNOSIS — C61 Malignant neoplasm of prostate: Secondary | ICD-10-CM | POA: Diagnosis not present

## 2014-02-15 DIAGNOSIS — E291 Testicular hypofunction: Secondary | ICD-10-CM | POA: Diagnosis not present

## 2014-02-22 DIAGNOSIS — N5201 Erectile dysfunction due to arterial insufficiency: Secondary | ICD-10-CM | POA: Diagnosis not present

## 2014-02-22 DIAGNOSIS — C61 Malignant neoplasm of prostate: Secondary | ICD-10-CM | POA: Diagnosis not present

## 2014-02-22 DIAGNOSIS — E291 Testicular hypofunction: Secondary | ICD-10-CM | POA: Diagnosis not present

## 2014-02-27 ENCOUNTER — Telehealth: Payer: Self-pay

## 2014-02-27 MED ORDER — SIMVASTATIN 40 MG PO TABS: ORAL_TABLET | ORAL | Status: DC

## 2014-02-27 NOTE — Telephone Encounter (Signed)
Pt states his [facility] have been trying to get in touch with the DR regarding his ZOCOR. Please call [phone]       [facility]

## 2014-02-27 NOTE — Telephone Encounter (Signed)
Sent in #30 with note for pt. Unable to lm on cell phone.

## 2014-03-08 ENCOUNTER — Ambulatory Visit (INDEPENDENT_AMBULATORY_CARE_PROVIDER_SITE_OTHER): Payer: Medicare Other | Admitting: Family Medicine

## 2014-03-08 VITALS — BP 126/80 | HR 99 | Temp 98.0°F | Resp 17 | Ht 71.0 in | Wt 198.0 lb

## 2014-03-08 DIAGNOSIS — R1084 Generalized abdominal pain: Secondary | ICD-10-CM

## 2014-03-08 DIAGNOSIS — E291 Testicular hypofunction: Secondary | ICD-10-CM | POA: Diagnosis not present

## 2014-03-08 DIAGNOSIS — E785 Hyperlipidemia, unspecified: Secondary | ICD-10-CM | POA: Diagnosis not present

## 2014-03-08 DIAGNOSIS — R42 Dizziness and giddiness: Secondary | ICD-10-CM | POA: Diagnosis not present

## 2014-03-08 DIAGNOSIS — Z1329 Encounter for screening for other suspected endocrine disorder: Secondary | ICD-10-CM

## 2014-03-08 DIAGNOSIS — R1013 Epigastric pain: Secondary | ICD-10-CM | POA: Diagnosis not present

## 2014-03-08 LAB — COMPLETE METABOLIC PANEL WITH GFR
ALBUMIN: 4.2 g/dL (ref 3.5–5.2)
ALK PHOS: 55 U/L (ref 39–117)
ALT: 35 U/L (ref 0–53)
AST: 23 U/L (ref 0–37)
BILIRUBIN TOTAL: 0.4 mg/dL (ref 0.2–1.2)
BUN: 11 mg/dL (ref 6–23)
CO2: 31 mEq/L (ref 19–32)
Calcium: 9.9 mg/dL (ref 8.4–10.5)
Chloride: 102 mEq/L (ref 96–112)
Creat: 1.08 mg/dL (ref 0.50–1.35)
GFR, Est African American: 77 mL/min
GFR, Est Non African American: 66 mL/min
GLUCOSE: 134 mg/dL — AB (ref 70–99)
POTASSIUM: 4.8 meq/L (ref 3.5–5.3)
SODIUM: 142 meq/L (ref 135–145)
Total Protein: 7.5 g/dL (ref 6.0–8.3)

## 2014-03-08 LAB — POCT CBC
Granulocyte percent: 62.5 %G (ref 37–80)
HEMATOCRIT: 42.8 % — AB (ref 43.5–53.7)
HEMOGLOBIN: 13.7 g/dL — AB (ref 14.1–18.1)
LYMPH, POC: 1.9 (ref 0.6–3.4)
MCH, POC: 31.5 pg — AB (ref 27–31.2)
MCHC: 32 g/dL (ref 31.8–35.4)
MCV: 98.5 fL — AB (ref 80–97)
MID (cbc): 0.2 (ref 0–0.9)
MPV: 8.1 fL (ref 0–99.8)
POC GRANULOCYTE: 3.4 (ref 2–6.9)
POC LYMPH PERCENT: 34.1 %L (ref 10–50)
POC MID %: 3.4 %M (ref 0–12)
Platelet Count, POC: 143 10*3/uL (ref 142–424)
RBC: 4.35 M/uL — AB (ref 4.69–6.13)
RDW, POC: 14.1 %
WBC: 5.5 10*3/uL (ref 4.6–10.2)

## 2014-03-08 LAB — LIPASE: Lipase: 55 U/L (ref 0–75)

## 2014-03-08 LAB — LIPID PANEL
CHOL/HDL RATIO: 2.6 ratio
Cholesterol: 138 mg/dL (ref 0–200)
HDL: 53 mg/dL (ref >39)
LDL Cholesterol: 65 mg/dL (ref 0–99)
Triglycerides: 99 mg/dL (ref <150)
VLDL: 20 mg/dL (ref 0–40)

## 2014-03-08 LAB — TSH: TSH: 1.359 u[IU]/mL (ref 0.350–4.500)

## 2014-03-08 LAB — GLUCOSE, POCT (MANUAL RESULT ENTRY): POC GLUCOSE: 152 mg/dL — AB (ref 70–99)

## 2014-03-08 MED ORDER — OMEPRAZOLE 20 MG PO CPDR: 20.0000 mg | DELAYED_RELEASE_CAPSULE | Freq: Every day | ORAL | Status: DC

## 2014-03-08 MED ORDER — SIMVASTATIN 40 MG PO TABS: 40.0000 mg | ORAL_TABLET | Freq: Every day | ORAL | Status: DC

## 2014-03-08 NOTE — Progress Notes (Addendum)
Subjective:    Patient ID: Douglas [name], male    DOB: 06-05-1935, 78 y.o.   MRN: [id number]  HPI Douglas [name] is a 78 y.o. male   Presents with c/o abdominal pain and dizziness.  Burning feeling in stomach, worsens with eating, more pain and burning, and seems to swell more. 1st noted few weeks ago. Worse yesterday. Pain all day.  Has not eaten today - still burning. No known hx of PUD. NO dark/tarry stools. Last BM last night - normal. No regular use of NSAIDS.   Also c/o dizziness since 4 days ago - woke up dizzy - trouble with balance with walking. No falls. No focal arm/leg weakness, no slurred speech.  Some sinus pressure, but no headache. No vomiting, has nausea with belching only.  No fever, no dyspnea, no chest pain, no recent air or mtn travel. Less dizzy today - fading, but still somewhat there.   Tx: tums   Patient Active Problem List   Diagnosis Date Noted  . Prostate cancer 05/30/2011  . Dyslipidemia 05/30/2011  . Hypogonadism male 05/30/2011  . SINUSITIS- ACUTE-NOS 01/07/2007  . RHINITIS 08/25/2006   Past Medical History  Diagnosis Date  . Cancer   . Hyperlipidemia   . Cataract   . Low testosterone    Past Surgical History  Procedure Laterality Date  . Spine surgery    . Appendectomy    . Eye surgery    . Prostate surgery     No Known Allergies Prior to Admission medications   Medication Sig Start Date End Date Taking? Authorizing Provider  aspirin 81 MG tablet Take 81 mg by mouth daily.   Yes Historical Provider, MD  Cholecalciferol (VITAMIN D) 2000 UNITS tablet Take 2,000 Units by mouth daily.   Yes Historical Provider, MD  fluticasone (FLONASE) 50 MCG/ACT nasal spray PLACE 2 SPRAYS INTO THE NOSE DAILY.   Yes Theda Sers, PA-C  ondansetron (ZOFRAN ODT) 4 MG disintegrating tablet Take one every 6 hours as needed for nausea or vomiting or belching 06/03/13  Yes Posey Boyer, MD  simvastatin (ZOCOR) 40 MG tablet TAKE 1 TABLET AT BEDTIME NEED OFFICE  VISIT FOR FURTHER REFILLS 02/27/14  Yes Chelle S Jeffery, PA-C  Tamsulosin HCl (FLOMAX) 0.4 MG CAPS Take 0.4 mg by mouth daily after supper.   Yes Historical Provider, MD  Testosterone 30 MG/ACT SOLN Place 30 g onto the skin.   Yes Historical Provider, MD  vitamin C (ASCORBIC ACID) 500 MG tablet Take 500 mg by mouth daily.   Yes Historical Provider, MD   History   Social History  . Marital Status: Married    Spouse Name: N/A    Number of Children: 3  . Years of Education: 2 years [employer]   Occupational History  . RETIRED    Social History Main Topics  . Smoking status: Never Smoker   . Smokeless tobacco: Not on file  . Alcohol Use: No     Comment: once a year  . Drug Use: No  . Sexual Activity: Yes   Other Topics Concern  . Not on file   Social History Narrative     Review of Systems  Constitutional: Negative for fever, chills and diaphoresis.  Respiratory: Negative for cough, chest tightness and shortness of breath.   Cardiovascular: Negative for chest pain and palpitations.  Gastrointestinal: Positive for nausea and abdominal pain. Negative for vomiting, diarrhea, constipation, blood in stool and anal bleeding.  Musculoskeletal: Negative for  joint swelling.  Skin: Negative for color change.  Neurological: Positive for dizziness. Negative for seizures, syncope, facial asymmetry, speech difficulty, numbness and headaches.       Objective:   Physical Exam  Constitutional: He is oriented to person, place, and time. He appears well-developed and well-nourished.  [name]:  Head: Normocephalic and atraumatic.  Right Ear: Tympanic membrane, external ear and ear canal normal.  Left Ear: Tympanic membrane, external ear and ear canal normal.  Nose: No rhinorrhea.  Mouth/Throat: Oropharynx is clear and moist and mucous membranes are normal. No oropharyngeal exudate or posterior oropharyngeal erythema.  Eyes: Conjunctivae and EOM are normal. Pupils are equal, round, and reactive to  light.  Neck: Neck supple. No JVD present. Carotid bruit is not present.  Cardiovascular: Normal rate, regular rhythm, normal heart sounds and intact distal pulses.   No murmur heard. Pulmonary/Chest: Effort normal and breath sounds normal. He has no wheezes. He has no rhonchi. He has no rales.  Abdominal: Soft. Bowel sounds are normal. He exhibits no distension. There is tenderness (diffuse, but most epigastric and periumbilical. ). There is no rebound and no guarding.  Musculoskeletal: He exhibits no edema.  Lymphadenopathy:    He has no cervical adenopathy.  Neurological: He is alert and oriented to person, place, and time. He has normal strength. No cranial nerve deficit or sensory deficit. He displays a negative Romberg sign. Gait normal. GCS eye subscore is 4. GCS verbal subscore is 5. GCS motor subscore is 6.  No pronator drift, some difficulty with heel to toe testing, but otherwise nonfocal.  Normal finger to nose.   Skin: Skin is warm and dry. No rash noted.  Psychiatric: He has a normal mood and affect. His behavior is normal.  Vitals reviewed.  Filed Vitals:   03/08/14 0808  BP: 126/80  Pulse: 99  Temp: 98 F (36.7 C)  TempSrc: Oral  Resp: 17  Height: 5\' 11"  (1.803 m)  Weight: 198 lb (89.812 kg)  SpO2: 98%   Results for orders placed or performed in visit on 03/08/14  POCT CBC  Result Value Ref Range   WBC 5.5 4.6 - 10.2 K/uL   Lymph, poc 1.9 0.6 - 3.4   POC LYMPH PERCENT 34.1 10 - 50 %L   MID (cbc) 0.2 0 - 0.9   POC MID % 3.4 0 - 12 %M   POC Granulocyte 3.4 2 - 6.9   Granulocyte percent 62.5 37 - 80 %G   RBC 4.35 (A) 4.69 - 6.13 M/uL   Hemoglobin 13.7 (A) 14.1 - 18.1 g/dL   HCT, POC 42.8 (A) 43.5 - 53.7 %   MCV 98.5 (A) 80 - 97 fL   MCH, POC 31.5 (A) 27 - 31.2 pg   MCHC 32.0 31.8 - 35.4 g/dL   RDW, POC 14.1 %   Platelet Count, POC 143 142 - 424 K/uL   MPV 8.1 0 - 99.8 fL  POCT glucose (manual entry)  Result Value Ref Range   POC Glucose 152 (A) 70 - 99  mg/dl   EKG: sinus bradycardia: rate 59, PR 192, no acute findings.      Assessment & Plan:   XXAVIER NOON is a 78 y.o. male Dizziness - Plan: POCT glucose (manual entry), TSH, EKG 12-Lead, IFOBT POC (occult bld, rslt in office), CANCELED: Basic metabolic panel  Abdominal pain, epigastric - Plan: POCT CBC, POCT glucose (manual entry), omeprazole (PRILOSEC) 20 MG capsule, Ambulatory referral to Gastroenterology, IFOBT POC (occult bld,  rslt in office)  Abdominal pain, generalized - Plan: POCT CBC, COMPLETE METABOLIC PANEL WITH GFR, Lipase, IFOBT POC (occult bld, rslt in office), CANCELED: Basic metabolic panel  Thyroid disorder screen - Plan: TSH  Possible gastritis vs PUD cause of abd pain. Less likely liver/GB source or pancreas, but will check CMP, lipase. Secondary dizziness past few days, but only borderline low hemoglobin.  Dizziness is also improving today and nonfocal neuro exam. Deferred neuroimaging at this time, but if persists, may need further workup. Has follow up scheduled for tomorrow.   -start omeprazole QD, avoid NSAIDS, spicy foods or alcohol.   -hemosure sent home to check for GI bleeding.   -check CMP, TSH, Lipase.   -follow up with Dr. Everlene Farrier tomorrow.  Can determine if abdominal imaging needed at that time.   -overnight ER/RTC precautions discussed.   Hyperglycemia - fasting today.  CAn be followed up with PCP at next routine visit.   Hyperlipidemia - at end of visit - requested refill of Zocor. Will add lipid panel to labs and refill zocor for now, but next time needed - follow up with PCP.   Lab Results  Component Value Date   CHOL 123 01/03/2013   HDL 41 01/03/2013   LDLCALC 64 01/03/2013   TRIG 91 01/03/2013   CHOLHDL 3.0 01/03/2013      Meds ordered this encounter  Medications  . omeprazole (PRILOSEC) 20 MG capsule    Sig: Take 1 capsule (20 mg total) by mouth daily.    Dispense:  30 capsule    Refill:  1   Patient Instructions  Start omeprazole  for possible gastritis or ulcer.  Avoid caffeine, alcohol, spicy food and NSAIDs like ibuprofen or naproxen. Bring stool cards back today or tomorrow to check for blood in stool. Follow up with Dr. Everlene Farrier any time after 8am tomorrow to [facility] your dizziness and abdominal pain. Will likely have you seen by your stomach specialist, but this can be discussed with Dr. Everlene Farrier tomorrow. Make sure you're drinking sufficient fluids. You should receive a call or letter about your lab results within the next week to 10 days - some may be ready tomorrow.   Return to the clinic or go to the nearest emergency room if any of your symptoms worsen or new symptoms occur.  Abdominal Pain Many things can cause abdominal pain. Usually, abdominal pain is not caused by a disease and will improve without treatment. It can often be observed and treated at home. Your health care provider will do a physical exam and possibly order blood tests and X-rays to help determine the seriousness of your pain. However, in many cases, more time must pass before a clear cause of the pain can be found. Before that point, your health care provider may not know if you need more testing or further treatment. HOME CARE INSTRUCTIONS  Monitor your abdominal pain for any changes. The following actions may help to alleviate any discomfort you are experiencing:  Only take over-the-counter or prescription medicines as directed by your health care provider.  Do not take laxatives unless directed to do so by your health care provider.  Try a clear liquid diet (broth, tea, or water) as directed by your health care provider. Slowly move to a bland diet as tolerated. SEEK MEDICAL CARE IF:  You have unexplained abdominal pain.  You have abdominal pain associated with nausea or diarrhea.  You have pain when you urinate or have a bowel movement.  You  experience abdominal pain that wakes you in the night.  You have abdominal pain that is worsened or  improved by eating food.  You have abdominal pain that is worsened with eating fatty foods.  You have a fever. SEEK IMMEDIATE MEDICAL CARE IF:   Your pain does not go away within 2 hours.  You keep throwing up (vomiting).  Your pain is felt only in portions of the abdomen, such as the right side or the left lower portion of the abdomen.  You pass bloody or black tarry stools. MAKE SURE YOU:  Understand these instructions.   Will watch your condition.   Will get help right away if you are not doing well or get worse.  Document Released: 12/25/2004 Document Revised: 03/22/2013 Document Reviewed: 11/24/2012 [facility] Patient Information 2015 [place], [place]. This information is not intended to replace advice given to you by your health care provider. Make sure you discuss any questions you have with your health care provider.   Dizziness Dizziness is a common problem. It is a feeling of unsteadiness or light-headedness. You may feel like you are about to faint. Dizziness can lead to injury if you stumble or fall. A person of any age group can suffer from dizziness, but dizziness is more common in older adults. CAUSES  Dizziness can be caused by many different things, including:  Middle ear problems.  Standing for too long.  Infections.  An allergic reaction.  Aging.  An emotional response to something, such as the sight of blood.  Side effects of medicines.  Tiredness.  Problems with circulation or blood pressure.  Excessive use of alcohol or medicines, or illegal drug use.  Breathing too fast (hyperventilation).  An irregular heart rhythm (arrhythmia).  A low red blood cell count (anemia).  Pregnancy.  Vomiting, diarrhea, fever, or other illnesses that cause body fluid loss (dehydration).  Diseases or conditions such as Parkinson's disease, high blood pressure (hypertension), diabetes, and thyroid problems.  Exposure to extreme heat. DIAGNOSIS  Your  health care provider will ask about your symptoms, perform a physical exam, and perform an electrocardiogram (ECG) to record the electrical activity of your heart. Your health care provider may also perform other heart or blood tests to determine the cause of your dizziness. These may include:  Transthoracic echocardiogram (TTE). During echocardiography, sound waves are used to evaluate how blood flows through your heart.  Transesophageal echocardiogram (TEE).  Cardiac monitoring. This allows your health care provider to monitor your heart rate and rhythm in real time.  Holter monitor. This is a portable device that records your heartbeat and can help diagnose heart arrhythmias. It allows your health care provider to track your heart activity for several days if needed.  Stress tests by exercise or by giving medicine that makes the heart beat faster. TREATMENT  Treatment of dizziness depends on the cause of your symptoms and can vary greatly. HOME CARE INSTRUCTIONS   Drink enough fluids to keep your urine clear or pale yellow. This is especially important in very hot weather. In older adults, it is also important in cold weather.  Take your medicine exactly as directed if your dizziness is caused by medicines. When taking blood pressure medicines, it is especially important to get up slowly.  Rise slowly from chairs and steady yourself until you feel okay.  In the morning, first sit up on the side of the bed. When you feel okay, stand slowly while holding onto something until you know your balance is  fine.  Move your legs often if you need to stand in one place for a long time. Tighten and relax your muscles in your legs while standing.  Have someone stay with you for 1-2 days if dizziness continues to be a problem. Do this until you feel you are well enough to stay alone. Have the person call your health care provider if he or she notices changes in you that are concerning.  Do not drive  or use heavy machinery if you feel dizzy.  Do not drink alcohol. SEEK IMMEDIATE MEDICAL CARE IF:   Your dizziness or light-headedness gets worse.  You feel nauseous or vomit.  You have problems talking, walking, or using your arms, hands, or legs.  You feel weak.  You are not thinking clearly or you have trouble forming sentences. It may take a friend or family member to notice this.  You have chest pain, abdominal pain, shortness of breath, or sweating.  Your vision changes.  You notice any bleeding.  You have side effects from medicine that seems to be getting worse rather than better. MAKE SURE YOU:   Understand these instructions.  Will watch your condition.  Will get help right away if you are not doing well or get worse. Document Released: 09/10/2000 Document Revised: 03/22/2013 Document Reviewed: 10/04/2010 [name] Patient Information 2015 [place], [place]. This information is not intended to replace advice given to you by your health care provider. Make sure you discuss any questions you have with your health care provider.

## 2014-03-08 NOTE — Patient Instructions (Addendum)
Start omeprazole for possible gastritis or ulcer.  Avoid caffeine, alcohol, spicy food and NSAIDs like ibuprofen or naproxen. Bring stool cards back today or tomorrow to check for blood in stool. Follow up with Dr. Everlene Farrier any time after 8am tomorrow to [facility] your dizziness and abdominal pain. Will likely have you seen by your stomach specialist, but this can be discussed with Dr. Everlene Farrier tomorrow. Make sure you're drinking sufficient fluids. You should receive a call or letter about your lab results within the next week to 10 days - some may be ready tomorrow.   Return to the clinic or go to the nearest emergency room if any of your symptoms worsen or new symptoms occur.  Abdominal Pain Many things can cause abdominal pain. Usually, abdominal pain is not caused by a disease and will improve without treatment. It can often be observed and treated at home. Your health care provider will do a physical exam and possibly order blood tests and X-rays to help determine the seriousness of your pain. However, in many cases, more time must pass before a clear cause of the pain can be found. Before that point, your health care provider may not know if you need more testing or further treatment. HOME CARE INSTRUCTIONS  Monitor your abdominal pain for any changes. The following actions may help to alleviate any discomfort you are experiencing:  Only take over-the-counter or prescription medicines as directed by your health care provider.  Do not take laxatives unless directed to do so by your health care provider.  Try a clear liquid diet (broth, tea, or water) as directed by your health care provider. Slowly move to a bland diet as tolerated. SEEK MEDICAL CARE IF:  You have unexplained abdominal pain.  You have abdominal pain associated with nausea or diarrhea.  You have pain when you urinate or have a bowel movement.  You experience abdominal pain that wakes you in the night.  You have abdominal pain that  is worsened or improved by eating food.  You have abdominal pain that is worsened with eating fatty foods.  You have a fever. SEEK IMMEDIATE MEDICAL CARE IF:   Your pain does not go away within 2 hours.  You keep throwing up (vomiting).  Your pain is felt only in portions of the abdomen, such as the right side or the left lower portion of the abdomen.  You pass bloody or black tarry stools. MAKE SURE YOU:  Understand these instructions.   Will watch your condition.   Will get help right away if you are not doing well or get worse.  Document Released: 12/25/2004 Document Revised: 03/22/2013 Document Reviewed: 11/24/2012 [facility] Patient Information 2015 [place], [place]. This information is not intended to replace advice given to you by your health care provider. Make sure you discuss any questions you have with your health care provider.   Dizziness Dizziness is a common problem. It is a feeling of unsteadiness or light-headedness. You may feel like you are about to faint. Dizziness can lead to injury if you stumble or fall. A person of any age group can suffer from dizziness, but dizziness is more common in older adults. CAUSES  Dizziness can be caused by many different things, including:  Middle ear problems.  Standing for too long.  Infections.  An allergic reaction.  Aging.  An emotional response to something, such as the sight of blood.  Side effects of medicines.  Tiredness.  Problems with circulation or blood pressure.  Excessive  use of alcohol or medicines, or illegal drug use.  Breathing too fast (hyperventilation).  An irregular heart rhythm (arrhythmia).  A low red blood cell count (anemia).  Pregnancy.  Vomiting, diarrhea, fever, or other illnesses that cause body fluid loss (dehydration).  Diseases or conditions such as Parkinson's disease, high blood pressure (hypertension), diabetes, and thyroid problems.  Exposure to extreme  heat. DIAGNOSIS  Your health care provider will ask about your symptoms, perform a physical exam, and perform an electrocardiogram (ECG) to record the electrical activity of your heart. Your health care provider may also perform other heart or blood tests to determine the cause of your dizziness. These may include:  Transthoracic echocardiogram (TTE). During echocardiography, sound waves are used to evaluate how blood flows through your heart.  Transesophageal echocardiogram (TEE).  Cardiac monitoring. This allows your health care provider to monitor your heart rate and rhythm in real time.  Holter monitor. This is a portable device that records your heartbeat and can help diagnose heart arrhythmias. It allows your health care provider to track your heart activity for several days if needed.  Stress tests by exercise or by giving medicine that makes the heart beat faster. TREATMENT  Treatment of dizziness depends on the cause of your symptoms and can vary greatly. HOME CARE INSTRUCTIONS   Drink enough fluids to keep your urine clear or pale yellow. This is especially important in very hot weather. In older adults, it is also important in cold weather.  Take your medicine exactly as directed if your dizziness is caused by medicines. When taking blood pressure medicines, it is especially important to get up slowly.  Rise slowly from chairs and steady yourself until you feel okay.  In the morning, first sit up on the side of the bed. When you feel okay, stand slowly while holding onto something until you know your balance is fine.  Move your legs often if you need to stand in one place for a long time. Tighten and relax your muscles in your legs while standing.  Have someone stay with you for 1-2 days if dizziness continues to be a problem. Do this until you feel you are well enough to stay alone. Have the person call your health care provider if he or she notices changes in you that are  concerning.  Do not drive or use heavy machinery if you feel dizzy.  Do not drink alcohol. SEEK IMMEDIATE MEDICAL CARE IF:   Your dizziness or light-headedness gets worse.  You feel nauseous or vomit.  You have problems talking, walking, or using your arms, hands, or legs.  You feel weak.  You are not thinking clearly or you have trouble forming sentences. It may take a friend or family member to notice this.  You have chest pain, abdominal pain, shortness of breath, or sweating.  Your vision changes.  You notice any bleeding.  You have side effects from medicine that seems to be getting worse rather than better. MAKE SURE YOU:   Understand these instructions.  Will watch your condition.  Will get help right away if you are not doing well or get worse. Document Released: 09/10/2000 Document Revised: 03/22/2013 Document Reviewed: 10/04/2010 [facility] Patient Information 2015 [place], [place]. This information is not intended to replace advice given to you by your health care provider. Make sure you discuss any questions you have with your health care provider.

## 2014-03-09 ENCOUNTER — Ambulatory Visit
Admission: RE | Admit: 2014-03-09 | Discharge: 2014-03-09 | Disposition: A | Discharge status: Home / Self Care | Payer: Medicare Other | Source: Ambulatory Visit | Attending: Emergency Medicine | Admitting: Emergency Medicine

## 2014-03-09 ENCOUNTER — Ambulatory Visit (INDEPENDENT_AMBULATORY_CARE_PROVIDER_SITE_OTHER): Payer: Medicare Other | Admitting: Emergency Medicine

## 2014-03-09 VITALS — BP 120/72 | HR 60 | Temp 98.2°F | Resp 17 | Ht 72.0 in | Wt 201.0 lb

## 2014-03-09 DIAGNOSIS — R42 Dizziness and giddiness: Secondary | ICD-10-CM

## 2014-03-09 DIAGNOSIS — R7309 Other abnormal glucose: Secondary | ICD-10-CM

## 2014-03-09 DIAGNOSIS — D6489 Other specified anemias: Secondary | ICD-10-CM

## 2014-03-09 DIAGNOSIS — Z8546 Personal history of malignant neoplasm of prostate: Secondary | ICD-10-CM | POA: Diagnosis not present

## 2014-03-09 LAB — POCT CBC
GRANULOCYTE PERCENT: 55.1 % (ref 37–80)
HCT, POC: 40.2 % — AB (ref 43.5–53.7)
Hemoglobin: 13.5 g/dL — AB (ref 14.1–18.1)
Lymph, poc: 2.3 (ref 0.6–3.4)
MCH, POC: 32.4 pg — AB (ref 27–31.2)
MCHC: 33.6 g/dL (ref 31.8–35.4)
MCV: 96.6 fL (ref 80–97)
MID (cbc): 0.2 (ref 0–0.9)
MPV: 7.6 fL (ref 0–99.8)
PLATELET COUNT, POC: 149 10*3/uL (ref 142–424)
POC Granulocyte: 3.1 (ref 2–6.9)
POC LYMPH PERCENT: 40.8 %L (ref 10–50)
POC MID %: 4.1 % (ref 0–12)
RBC: 4.17 M/uL — AB (ref 4.69–6.13)
RDW, POC: 12.9 %
WBC: 5.7 10*3/uL (ref 4.6–10.2)

## 2014-03-09 LAB — POCT GLYCOSYLATED HEMOGLOBIN (HGB A1C): HEMOGLOBIN A1C: 6.6

## 2014-03-09 LAB — IFOBT (OCCULT BLOOD): IFOBT: POSITIVE

## 2014-03-09 NOTE — Progress Notes (Signed)
Subjective:    Patient ID: Douglas [name], male    DOB: Oct 29, 1935, 78 y.o.   MRN: [id number] This chart was scribed by Zola Button, Medical Scribe, for Arlyss Queen, MD. Patient was seen in room 4 and patient care was started at 9:42 AM.  HPI Douglas [name] is a 78 y.o. male who presents to [facility] for a follow-up. Patient reports that he still has lightheadedness, a room spinning sensation. He states his lightheadedness has gradually been getting better. He states his lightheadedness is noticed mostly when looking up after looking down, and he feels woozy when looking around. Patient states he had some difficulty walking in a straight line yesterday, but he states it is a bit better today. He does use eyedrops. Patient has never had a scan of his head. He denies having ear problems and double vision.    Review of Systems  Eyes: Negative for discharge and visual disturbance.  Neurological: Positive for dizziness and light-headedness.       Objective:   Physical Exam  Constitutional: He is oriented to person, place, and time. He appears well-developed and well-nourished.  [name]:  Head: Normocephalic.  Eyes: Pupils are equal, round, and reactive to light.  Neck: Normal range of motion.  Cardiovascular: Normal rate and regular rhythm.   Pulmonary/Chest: Breath sounds normal.  Abdominal: Soft.  Neurological: He is alert and oriented to person, place, and time. He has normal reflexes. No cranial nerve deficit. Coordination normal.  Psychiatric: He has a normal mood and affect. His behavior is normal. Judgment and thought content normal.   Results for orders placed or performed in visit on 03/08/14  TSH  Result Value Ref Range   TSH 1.359 0.350 - 4.500 uIU/mL  COMPLETE METABOLIC PANEL WITH GFR  Result Value Ref Range   Sodium 142 135 - 145 mEq/L   Potassium 4.8 3.5 - 5.3 mEq/L   Chloride 102 96 - 112 mEq/L   CO2 31 19 - 32 mEq/L   Glucose, Bld 134 (H) 70 - 99  mg/dL   BUN 11 6 - 23 mg/dL   Creat 1.08 0.50 - 1.35 mg/dL   Total Bilirubin 0.4 0.2 - 1.2 mg/dL   Alkaline Phosphatase 55 39 - 117 U/L   AST 23 0 - 37 U/L   ALT 35 0 - 53 U/L   Total Protein 7.5 6.0 - 8.3 g/dL   Albumin 4.2 3.5 - 5.2 g/dL   Calcium 9.9 8.4 - 10.5 mg/dL   GFR, Est African American 77 mL/min   GFR, Est Non African American 66 mL/min  Lipase  Result Value Ref Range   Lipase 55 0 - 75 U/L  Lipid panel  Result Value Ref Range   Cholesterol 138 0 - 200 mg/dL   Triglycerides 99 <150 mg/dL   HDL 53 >39 mg/dL   Total CHOL/HDL Ratio 2.6 Ratio   VLDL 20 0 - 40 mg/dL   LDL Cholesterol 65 0 - 99 mg/dL  POCT CBC  Result Value Ref Range   WBC 5.5 4.6 - 10.2 K/uL   Lymph, poc 1.9 0.6 - 3.4   POC LYMPH PERCENT 34.1 10 - 50 %L   MID (cbc) 0.2 0 - 0.9   POC MID % 3.4 0 - 12 %M   POC Granulocyte 3.4 2 - 6.9   Granulocyte percent 62.5 37 - 80 %G   RBC 4.35 (A) 4.69 - 6.13 M/uL   Hemoglobin 13.7 (A) 14.1 -  18.1 g/dL   HCT, POC 42.8 (A) 43.5 - 53.7 %   MCV 98.5 (A) 80 - 97 fL   MCH, POC 31.5 (A) 27 - 31.2 pg   MCHC 32.0 31.8 - 35.4 g/dL   RDW, POC 14.1 %   Platelet Count, POC 143 142 - 424 K/uL   MPV 8.1 0 - 99.8 fL  POCT glucose (manual entry)  Result Value Ref Range   POC Glucose 152 (A) 70 - 99 mg/dl   Results for orders placed or performed in visit on 03/09/14  IFOBT POC (occult bld, rslt in office)  Result Value Ref Range   IFOBT Positive   POCT CBC  Result Value Ref Range   WBC 5.7 4.6 - 10.2 K/uL   Lymph, poc 2.3 0.6 - 3.4   POC LYMPH PERCENT 40.8 10 - 50 %L   MID (cbc) 0.2 0 - 0.9   POC MID % 4.1 0 - 12 %M   POC Granulocyte 3.1 2 - 6.9   Granulocyte percent 55.1 37 - 80 %G   RBC 4.17 (A) 4.69 - 6.13 M/uL   Hemoglobin 13.5 (A) 14.1 - 18.1 g/dL   HCT, POC 40.2 (A) 43.5 - 53.7 %   MCV 96.6 80 - 97 fL   MCH, POC 32.4 (A) 27 - 31.2 pg   MCHC 33.6 31.8 - 35.4 g/dL   RDW, POC 12.9 %   Platelet Count, POC 149 142 - 424 K/uL   MPV 7.6 0 - 99.8 fL  POCT  glycosylated hemoglobin (Hb A1C)  Result Value Ref Range   Hemoglobin A1C 6.6          Assessment & Plan:  Patient has a hemoglobin A1c of 6.6. It does appear that he is now diabetic. I reviewed this with him. He will be on diet and exercise at the present time with repeat blood work in 3 months. We'll go ahead and schedule a CT of the head because of his dizziness and lightheadedness..I personally performed the services described in this documentation, which was scribed in my presence. The recorded information has been reviewed and is accurate he is heme positive and referral has artery been placed with GI.

## 2014-03-09 NOTE — Patient Instructions (Signed)
Type 2 Diabetes Mellitus Type 2 diabetes mellitus, often simply referred to as type 2 diabetes, is a long-lasting (chronic) disease. In type 2 diabetes, the pancreas does not make enough insulin (a hormone), the cells are less responsive to the insulin that is made (insulin resistance), or both. Normally, insulin moves sugars from food into the tissue cells. The tissue cells use the sugars for energy. The lack of insulin or the lack of normal response to insulin causes excess sugars to build up in the blood instead of going into the tissue cells. As a result, high blood sugar (hyperglycemia) develops. The effect of high sugar (glucose) levels can cause many complications. Type 2 diabetes was also previously called adult-onset diabetes, but it can occur at any age.  RISK FACTORS  A person is predisposed to developing type 2 diabetes if someone in the family has the disease and also has one or more of the following primary risk factors:  Overweight.  An inactive lifestyle.  A history of consistently eating high-calorie foods. Maintaining a normal weight and regular physical activity can reduce the chance of developing type 2 diabetes. SYMPTOMS  A person with type 2 diabetes may not show symptoms initially. The symptoms of type 2 diabetes appear slowly. The symptoms include:  Increased thirst (polydipsia).  Increased urination (polyuria).  Increased urination during the night (nocturia).  Weight loss. This weight loss may be rapid.  Frequent, recurring infections.  Tiredness (fatigue).  Weakness.  Vision changes, such as blurred vision.  Fruity smell to your breath.  Abdominal pain.  Nausea or vomiting.  Cuts or bruises which are slow to heal.  Tingling or numbness in the hands or feet. DIAGNOSIS Type 2 diabetes is frequently not diagnosed until complications of diabetes are present. Type 2 diabetes is diagnosed when symptoms or complications are present and when blood  glucose levels are increased. Your blood glucose level may be checked by one or more of the following blood tests:  A fasting blood glucose test. You will not be allowed to eat for at least 8 hours before a blood sample is taken.  A random blood glucose test. Your blood glucose is checked at any time of the day regardless of when you ate.  A hemoglobin A1c blood glucose test. A hemoglobin A1c test provides information about blood glucose control over the previous 3 months.  An oral glucose tolerance test (OGTT). Your blood glucose is measured after you have not eaten (fasted) for 2 hours and then after you drink a glucose-containing beverage. TREATMENT   You may need to take insulin or diabetes medicine daily to keep blood glucose levels in the desired range.  If you use insulin, you may need to adjust the dosage depending on the carbohydrates that you eat with each meal or snack. The treatment goal is to maintain the before meal blood sugar (preprandial glucose) level at 70-130 mg/dL. HOME CARE INSTRUCTIONS   Have your hemoglobin A1c level checked twice a year.  Perform daily blood glucose monitoring as directed by your health care provider.  Monitor urine ketones when you are ill and as directed by your health care provider.  Take your diabetes medicine or insulin as directed by your health care provider to maintain your blood glucose levels in the desired range.  Never run out of diabetes medicine or insulin. It is needed every day.  If you are using insulin, you may need to adjust the amount of insulin given based on your intake of   carbohydrates. Carbohydrates can raise blood glucose levels but need to be included in your diet. Carbohydrates provide vitamins, minerals, and fiber which are an essential part of a healthy diet. Carbohydrates are found in fruits, vegetables, whole grains, dairy products, legumes, and foods containing added sugars.  Eat healthy foods. You should make an  appointment to see a registered dietitian to help you create an eating plan that is right for you.  Lose weight if you are overweight.  Carry a medical alert card or wear your medical alert jewelry.  Carry a 15-gram carbohydrate snack with you at all times to treat low blood glucose (hypoglycemia). Some examples of 15-gram carbohydrate snacks include:  Glucose tablets, 3 or 4.  Glucose gel, 15-gram tube.  Raisins, 2 tablespoons (24 grams).  Jelly beans, 6.  Animal crackers, 8.  Regular pop, 4 ounces (120 mL).  Gummy treats, 9.  Recognize hypoglycemia. Hypoglycemia occurs with blood glucose levels of 70 mg/dL and below. The risk for hypoglycemia increases when fasting or skipping meals, during or after intense exercise, and during sleep. Hypoglycemia symptoms can include:  Tremors or shakes.  Decreased ability to concentrate.  Sweating.  Increased heart rate.  Headache.  Dry mouth.  Hunger.  Irritability.  Anxiety.  Restless sleep.  Altered speech or coordination.  Confusion.  Treat hypoglycemia promptly. If you are alert and able to safely swallow, follow the 15:15 rule:  Take 15-20 grams of rapid-acting glucose or carbohydrate. Rapid-acting options include glucose gel, glucose tablets, or 4 ounces (120 mL) of fruit juice, regular soda, or low-fat milk.  Check your blood glucose level 15 minutes after taking the glucose.  Take 15-20 grams more of glucose if the repeat blood glucose level is still 70 mg/dL or below.  Eat a meal or snack within 1 hour once blood glucose levels return to normal.  Be alert to feeling very thirsty and urinating more frequently than usual, which are early signs of hyperglycemia. An early awareness of hyperglycemia allows for prompt treatment. Treat hyperglycemia as directed by your health care provider.  Engage in at least 150 minutes of moderate-intensity physical activity a week, spread over at least 3 days of the week or as  directed by your health care provider. In addition, you should engage in resistance exercise at least 2 times a week or as directed by your health care provider. Try to spend no more than 90 minutes at one time inactive.  Adjust your medicine and food intake as needed if you start a new exercise or sport.  Follow your sick-day plan anytime you are unable to eat or drink as usual.  Do not use any tobacco products including cigarettes, chewing tobacco, or electronic cigarettes. If you need help quitting, ask your health care provider.  Limit alcohol intake to no more than 1 drink per day for nonpregnant women and 2 drinks per day for men. You should drink alcohol only when you are also eating food. Talk with your health care provider whether alcohol is safe for you. Tell your health care provider if you drink alcohol several times a week.  Keep all follow-up visits as directed by your health care provider. This is important.  Schedule an eye exam soon after the diagnosis of type 2 diabetes and then annually.  Perform daily skin and foot care. Examine your skin and feet daily for cuts, bruises, redness, nail problems, bleeding, blisters, or sores. A foot exam by a health care provider should be done annually.    Brush your teeth and gums at least twice a day and floss at least once a day. Follow up with your dentist regularly.  Share your diabetes management plan with your workplace or school.  Stay up-to-date with immunizations. It is recommended that people with diabetes who are over 77 years old get the pneumonia vaccine. In some cases, two separate shots may be given. Ask your health care provider if your pneumonia vaccination is up-to-date.  Learn to manage stress.  Obtain ongoing diabetes education and support as needed.  Participate in or seek rehabilitation as needed to maintain or improve independence and quality of life. Request a physical or occupational therapy referral if you are  having foot or hand numbness, or difficulties with grooming, dressing, eating, or physical activity. SEEK MEDICAL CARE IF:   You are unable to eat food or drink fluids for more than 6 hours.  You have nausea and vomiting for more than 6 hours.  Your blood glucose level is over 240 mg/dL.  There is a change in mental status.  You develop an additional serious illness.  You have diarrhea for more than 6 hours.  You have been sick or have had a fever for a couple of days and are not getting better.  You have pain during any physical activity.  SEEK IMMEDIATE MEDICAL CARE IF:  You have difficulty breathing.  You have moderate to large ketone levels. MAKE SURE YOU:  Understand these instructions.  Will watch your condition.  Will get help right away if you are not doing well or get worse. Document Released: 03/17/2005 Document Revised: 08/01/2013 Document Reviewed: 10/14/2011 [facility] Patient Information 2015 [place], [place]. This information is not intended to replace advice given to you by your health care provider. Make sure you discuss any questions you have with your health care provider. Vertigo Vertigo means you feel like you or your surroundings are moving when they are not. Vertigo can be dangerous if it occurs when you are at work, driving, or performing difficult activities.  CAUSES  Vertigo occurs when there is a conflict of signals sent to your brain from the visual and sensory systems in your body. There are many different causes of vertigo, including:  Infections, especially in the inner ear.  A bad reaction to a drug or misuse of alcohol and medicines.  Withdrawal from drugs or alcohol.  Rapidly changing positions, such as lying down or rolling over in bed.  A migraine headache.  Decreased blood flow to the brain.  Increased pressure in the brain from a head injury, infection, tumor, or bleeding. SYMPTOMS  You may feel as though the world is spinning  around or you are falling to the ground. Because your balance is upset, vertigo can cause nausea and vomiting. You may have involuntary eye movements (nystagmus). DIAGNOSIS  Vertigo is usually diagnosed by physical exam. If the cause of your vertigo is unknown, your caregiver may perform imaging tests, such as an MRI scan (magnetic resonance imaging). TREATMENT  Most cases of vertigo resolve on their own, without treatment. Depending on the cause, your caregiver may prescribe certain medicines. If your vertigo is related to body position issues, your caregiver may recommend movements or procedures to correct the problem. In rare cases, if your vertigo is caused by certain inner ear problems, you may need surgery. HOME CARE INSTRUCTIONS   Follow your caregiver's instructions.  Avoid driving.  Avoid operating heavy machinery.  Avoid performing any tasks that would be dangerous to you  or others during a vertigo episode.  Tell your caregiver if you notice that certain medicines seem to be causing your vertigo. Some of the medicines used to treat vertigo episodes can actually make them worse in some people. SEEK IMMEDIATE MEDICAL CARE IF:   Your medicines do not relieve your vertigo or are making it worse.  You develop problems with talking, walking, weakness, or using your arms, hands, or legs.  You develop severe headaches.  Your nausea or vomiting continues or gets worse.  You develop visual changes.  A family member notices behavioral changes.  Your condition gets worse. MAKE SURE YOU:  Understand these instructions.  Will watch your condition.  Will get help right away if you are not doing well or get worse. Document Released: 12/25/2004 Document Revised: 06/09/2011 Document Reviewed: 10/03/2010 [facility] Patient Information 2015 [place], [place]. This information is not intended to replace advice given to you by your health care provider. Make sure you discuss any questions you  have with your health care provider.

## 2014-03-13 ENCOUNTER — Ambulatory Visit: Payer: Medicare Other | Admitting: Gastroenterology

## 2014-03-16 ENCOUNTER — Encounter: Payer: Self-pay | Admitting: Gastroenterology

## 2014-03-16 ENCOUNTER — Ambulatory Visit (INDEPENDENT_AMBULATORY_CARE_PROVIDER_SITE_OTHER): Payer: Medicare Other | Admitting: Gastroenterology

## 2014-03-16 VITALS — BP 132/68 | HR 70 | Ht 71.0 in | Wt 202.0 lb

## 2014-03-16 DIAGNOSIS — R1013 Epigastric pain: Secondary | ICD-10-CM | POA: Diagnosis not present

## 2014-03-16 DIAGNOSIS — R195 Other fecal abnormalities: Secondary | ICD-10-CM | POA: Insufficient documentation

## 2014-03-16 NOTE — Progress Notes (Signed)
Reviewed and agree with management plan.  Vermelle Cammarata T. Aydon Swamy, MD FACG 

## 2014-03-16 NOTE — Patient Instructions (Signed)
Please continue your Omeprazole 20 mg.  Please follow up as needed or sooner if you are not feeling better.

## 2014-03-16 NOTE — Progress Notes (Signed)
03/16/2014 Douglas [name] [id number] 03/08/1936   HISTORY OF PRESENT ILLNESS:  This is a 78 year old male who is known to Dr. Fuller Plan for colonoscopy in 05/2012.  At that time he was found to have some polyps that were removed and were tubular adenomas.  It was recommended that he have another procedure in 3 years from that time.  He presents to our office today for a couple of issues.  First, he was recently found to have a heme positive stool by his PCP.  He denies seeing blood in his stools or black stools.  He denies NSAID use (says that he did take some for about a month but has been off of them for at least the past month or so).  Hgb is 13.5 grams, which has been stable over the past 2 years.    He does complain of some epigastric abdominal pain and belching that has been present for the past 2 weeks or so.  Says that he's never really had issue with reflux in the past.  His PCP placed him on omeprazole 20 mg daily and the symptoms have greatly improved.  Still having some belching, but no pain.   Past Medical History  Diagnosis Date  . Cancer   . Hyperlipidemia   . Cataract   . Low testosterone    Past Surgical History  Procedure Laterality Date  . Spine surgery    . Appendectomy    . Eye surgery    . Prostate surgery      reports that he has never smoked. He does not have any smokeless tobacco history on file. He reports that he does not drink alcohol or use illicit drugs. family history includes Cancer (age of onset: 22) in his father. There is no history of Colon cancer. No Known Allergies    Outpatient Encounter Prescriptions as of 03/16/2014  Medication Sig  . aspirin 81 MG tablet Take 81 mg by mouth daily.  . Cholecalciferol (VITAMIN D) 2000 UNITS tablet Take 2,000 Units by mouth daily.  . fluticasone (FLONASE) 50 MCG/ACT nasal spray PLACE 2 SPRAYS INTO THE NOSE DAILY.  Marland Kitchen omeprazole (PRILOSEC) 20 MG capsule Take 1 capsule (20 mg total) by mouth daily.  .  simvastatin (ZOCOR) 40 MG tablet Take 1 tablet (40 mg total) by mouth daily at 6 PM.  . Tamsulosin HCl (FLOMAX) 0.4 MG CAPS Take 0.4 mg by mouth daily after supper.  . Testosterone 30 MG/ACT SOLN Place 30 g onto the skin.  Marland Kitchen vitamin C (ASCORBIC ACID) 500 MG tablet Take 500 mg by mouth daily.  . [DISCONTINUED] ondansetron (ZOFRAN ODT) 4 MG disintegrating tablet Take one every 6 hours as needed for nausea or vomiting or belching (Patient not taking: Reported on 03/09/2014)     REVIEW OF SYSTEMS  : All other systems reviewed and negative except where noted in the History of Present Illness.   PHYSICAL EXAM: BP 132/68 mmHg  Pulse 70  Ht 5\' 11"  (1.803 m)  Wt 202 lb (91.627 kg)  BMI 28.19 kg/m2 General: Well developed black male in no acute distress Head: Normocephalic and atraumatic Eyes:  Sclerae anicteric, conjunctiva pink. Ears: Normal auditory acuity Lungs: Clear throughout to auscultation Heart: Regular rate and rhythm Abdomen: Soft, non-distended.  Normal bowel sounds.  Non-tender. Musculoskeletal: Symmetrical with no gross deformities  Skin: No lesions on visible extremities Extremities: No edema  Neurological: Alert oriented x 4, grossly non-focal Psychological:  Alert and cooperative. Normal  mood and affect  ASSESSMENT AND PLAN: -Epigastric abdominal and belching:  Improved on omeprazole 20 mg daily.  ? Ulcer vs gastritis vs GERD, etc. -Heme positive stool:  One occurrence.  Patient denies seeing blood or black stools.  Hgb is 13.5 grams, stable from one year ago.  Colonoscopy 05/2012.  No need for repeat colonoscopy at this time.  We discussed possible EGD for evaluation especially in light of the other recent symptoms discussed above.  Patient declined EGD for now.  He will continue omeprazole 20 mg daily for 4-6 weeks and then can try to discontinue it to see how he does without it.  Will call back if symptoms return and he decides to proceed with EGD.

## 2014-04-24 ENCOUNTER — Ambulatory Visit (INDEPENDENT_AMBULATORY_CARE_PROVIDER_SITE_OTHER): Payer: Medicare Other | Admitting: Family Medicine

## 2014-04-24 ENCOUNTER — Encounter: Payer: Self-pay | Admitting: Family Medicine

## 2014-04-24 VITALS — BP 140/72 | HR 63 | Temp 97.9°F | Resp 16 | Ht 71.25 in | Wt 205.6 lb

## 2014-04-24 DIAGNOSIS — E785 Hyperlipidemia, unspecified: Secondary | ICD-10-CM | POA: Diagnosis not present

## 2014-04-24 DIAGNOSIS — Z0000 Encounter for general adult medical examination without abnormal findings: Secondary | ICD-10-CM | POA: Diagnosis not present

## 2014-04-24 DIAGNOSIS — M7542 Impingement syndrome of left shoulder: Secondary | ICD-10-CM

## 2014-04-24 DIAGNOSIS — Z23 Encounter for immunization: Secondary | ICD-10-CM

## 2014-04-24 DIAGNOSIS — E119 Type 2 diabetes mellitus without complications: Secondary | ICD-10-CM

## 2014-04-24 LAB — GLUCOSE, POCT (MANUAL RESULT ENTRY): POC GLUCOSE: 95 mg/dL (ref 70–99)

## 2014-04-24 NOTE — Progress Notes (Addendum)
Subjective:  This chart was scribed for Douglas Ray, MD by Dellis Filbert, ED Scribe at [facility].The patient was seen in exam room 22 and the patient's care was started at 2:45 PM.   Patient ID: Douglas [name], male    DOB: Apr 14, 1935, 79 y.o.   MRN: [id number] Chief Complaint  Patient presents with  . Annual Exam  . Medication Refill    Simvastatin 40 mg   HPI HPI Comments: Douglas [name] is a 79 y.o. male with a history of HLD, prostate cancer, low testosterone and allerigic rhinitis who presents to [facility] for an medicare wellness annual exam, last seen by me Dec 9th last year. Previous primary pt with Dr. Elder Cyphers and was transferred to me.   Cancer screening: colon cancer screening march 2015, tubular adenoma planned for repeat in 3 years.  Prostate screening: Diagnosed with prostate cancer 6-7 years ago, had seed implants. PSA 0.14 in Oct. 2014, followed by urologist Dr. [name] for prostate cancer and low testosterone, Last seen in Nov 2015 and PSA was stable. He sees his urologist every year, next scheduled appointment in Nov 2016.   Immunizations: Has not had Prevnar can give today, UTD on influenza vaccine Immunization History  Administered Date(s) Administered  . Influenza Split 12/30/2010, 12/25/2011  . Influenza,inj,Quad PF,36+ Mos 12/07/2012, 12/13/2013  . Pneumococcal Polysaccharide-23 05/31/2001, 06/08/2009  . Td 06/12/2008  . Zoster 03/31/2008   Fall and Depression Screening: Both negative per screening and [facility] tool.  Functional status screening: Normal per screening tool in [facility]L  Advanced Directives: Does have a living well in place. He is full code.  Hearing: He denies hearing difficulties.   Vision/Optho: Appointment in March  Visual Acuity Screening   Right eye Left eye Both eyes  Without correction:     With correction: 20/20 20/30 20/25    Dentist: Last appointment was 2 months ago  Exercise: See below in diabetes  section  Abdominal pain: suspected gastritis vs. PUD when seen at Dec 9th visit. Did have a positive hemoccult in December, was referred to gastroenterology. Seen by [facility] on Dec. 17th. Previous colonoscopy by Dr. Fuller Plan, positive for tubular adenoma. He was continued on omeprazole 20 mg qd for 4-6 week, then was told to discontinue. He declined EEG at that time. Planned for repeat colonoscopy in 2017. He is still taking omeprazole, but plans on discontinuing in a week or two. His reflux has improved  HLD: Stable with LDL of 65 in Dec. Will continue on his simvastatin medication. No new myalgias.   Diabetes: HbA1C ws 6.6 on Dec. 10th, plan on diet and exercise with repeat blood work in 3 months. Weight was 201 with a BMI of 28 in Dec. He is 205 today. Pt does exercise on the treadmill for an hour everyday and stretches for 15 min. Pt does not regulate his diet, he does want to meet with a nutritionist. No of FHx of diabetes.  Left Shoulder Pain: It is chronic ongoing for more than 5-6 years. He will get a steroid injection which improves his pain, last one was 2-3 years ago. The current pain has been ongoing for 3 weeks now. He has taken Advil or tylenol for no relief. . Last x-[name] was in June 2015 with no acute abnormalities an no evidence of arthropathy. He wants the steroid injection today. He denies trauma to his left shoulder and only activity is weight lifting. He is right hand dominant.  Patient Active  Problem List   Diagnosis Date Noted  . Heme positive stool 03/16/2014  . Abdominal pain, epigastric 03/16/2014  . Prostate cancer 05/30/2011  . Dyslipidemia 05/30/2011  . Hypogonadism male 05/30/2011  . SINUSITIS- ACUTE-NOS 01/07/2007  . RHINITIS 08/25/2006   Past Medical History  Diagnosis Date  . Cancer   . Hyperlipidemia   . Cataract   . Low testosterone    Past Surgical History  Procedure Laterality Date  . Spine surgery    . Appendectomy    . Eye surgery    . Prostate  surgery     No Known Allergies Prior to Admission medications   Medication Sig Start Date End Date Taking? Authorizing Provider  aspirin 81 MG tablet Take 81 mg by mouth daily.   Yes Historical Provider, MD  Cholecalciferol (VITAMIN D) 2000 UNITS tablet Take 2,000 Units by mouth daily.   Yes Historical Provider, MD  fluticasone (FLONASE) 50 MCG/ACT nasal spray PLACE 2 SPRAYS INTO THE NOSE DAILY.   Yes Theda Sers, PA-C  omeprazole (PRILOSEC) 20 MG capsule Take 1 capsule (20 mg total) by mouth daily. 03/08/14  Yes [name], MD  simvastatin (ZOCOR) 40 MG tablet Take 1 tablet (40 mg total) by mouth daily at 6 PM. 03/08/14  Yes [name], MD  Tamsulosin HCl (FLOMAX) 0.4 MG CAPS Take 0.4 mg by mouth daily after supper.   Yes Historical Provider, MD  Testosterone 30 MG/ACT SOLN Place 30 g onto the skin.   Yes Historical Provider, MD  vitamin C (ASCORBIC ACID) 500 MG tablet Take 500 mg by mouth daily.   Yes Historical Provider, MD   History   Social History  . Marital Status: Married    Spouse Name: N/A    Number of Children: 3  . Years of Education: 2 years [employer]   Occupational History  . RETIRED    Social History Main Topics  . Smoking status: Never Smoker   . Smokeless tobacco: Not on file  . Alcohol Use: No     Comment: once a year  . Drug Use: No  . Sexual Activity: Yes   Other Topics Concern  . Not on file   Social History Narrative    Review of Systems  Eyes: Positive for visual disturbance.  Musculoskeletal: Positive for arthralgias.   13 point ROS reviewed notable for visual problems, increased thirst and joint pain. Nursing note reviewed.    Objective:  BP 140/72 mmHg  Pulse 63  Temp(Src) 97.9 F (36.6 C) (Oral)  Resp 16  Ht 5' 11.25" (1.81 m)  Wt 205 lb 9.6 oz (93.26 kg)  BMI 28.47 kg/m2  SpO2 96%  Physical Exam  Constitutional: He is oriented to person, place, and time. He appears well-developed and well-nourished. No distress.  [name]:   Head: Normocephalic and atraumatic.  Right Ear: External ear normal.  Left Ear: External ear normal.  Mouth/Throat: Oropharynx is clear and moist.  Eyes: Conjunctivae and EOM are normal. Pupils are equal, round, and reactive to light.  Neck: Normal range of motion. Neck supple. No thyromegaly present.  Cardiovascular: Normal rate, regular rhythm, normal heart sounds and intact distal pulses.   Pulmonary/Chest: Effort normal and breath sounds normal. No respiratory distress. He has no wheezes.  Lungs are clear.  Abdominal: Soft. He exhibits no distension. There is no tenderness.  Musculoskeletal: Normal range of motion. He exhibits no edema or tenderness.  Left Shoulder: Full ROM, full rotator cuff strength Positive Neer's and positive  Luan Pulling'. Negative cross over.  Lymphadenopathy:    He has no cervical adenopathy.  Neurological: He is alert and oriented to person, place, and time. He has normal reflexes.  Skin: Skin is warm and dry.  Psychiatric: He has a normal mood and affect. His behavior is normal.  Nursing note and vitals reviewed.  Results for orders placed or performed in visit on 04/24/14  POCT glucose (manual entry)  Result Value Ref Range   POC Glucose 95 70 - 99 mg/dl   Risks (including but not limited to bleeding and infection), benefits, and alternatives discussed for L subacromial bursa injection, posterior approach .  Verbal consent obtained after any questions were answered. Landmarks noted, and marked as needed. Area cleansed with Betadine x3, ethyl chloride spray for topical anesthesia, followed by alcohol swab.  Injected with 1cc kenalog 40mg /ml and 3cc lidaocaine 1%plain.  No complications. Bandage applied.  RTC precautions discussed in regards to injection.      Assessment & Plan:  Douglas [name] is a 79 y.o. male Encounter for Medicare annual wellness exam  -anticipatory guidance as below in AVS, screening labs above. Health maintenance items as above in HPI  discussed/recommended as applicable. No concerning responses on fall, depression,  Or functional status screening. Up to date on colon cancer screening and followed by urology with hx of prostate cancer. Due for prevnar today.   Type 2 diabetes mellitus without complication - Plan: POCT glucose (manual entry), Amb ref to Medical Nutrition Therapy-MNT, Microalbumin, urine  - diet controlled, but will refer to nutritionist for further education. Check A1c at next ov - 2-3 months.   Need for prophylactic vaccination against Streptococcus pneumoniae (pneumococcus) - Plan: Pneumococcal conjugate vaccine 13-valent IM - Prevnar.   Impingement syndrome, shoulder, left. Recurrent by hx with underlying RTC tendinosis likely.  NKI recently, equal RTC strength, and XR last year appeared ok.  -Deferred repeat XR in office today.   -with abdominal pain, possible PUD/gastritis - decided against NSAID trial.  Injected as above after VCO. HEP by handout and RTC precautions.   Hyperlipidemia  - stable on recent labs. Refill zocor.   Abd pain, gastritis vs GERD. See GI notes. Plan on trial off PPI in next week or two and if sx's recur, may need EGD - to call GI for eval if this occurs.   No orders of the defined types were placed in this encounter.   Patient Instructions  I will refer you to nutritionist to discuss diet for diabetes. continue exercise.  Can stop omeprazole in next 2 weeks - if heartburn or abdominal symptoms return - you need to meet with gastroenterologist again.  You should receive a call or letter about your lab results within the next week to 10 days.   Decrease weightlifting for next 2-3 weeks as shoulder improves, then exercises below. Return to the clinic or go to the nearest emergency room if any of your symptoms worsen or new symptoms occur.  Keeping you healthy  Get these tests  Blood pressure- Have your blood pressure checked once a year by your healthcare provider.  Normal  blood pressure is 120/80  Weight- Have your body mass index (BMI) calculated to screen for obesity.  BMI is a measure of body fat based on height and weight. You can also calculate your own BMI at [employer].[email or web address].  Cholesterol- Have your cholesterol checked every year.  Diabetes- Have your blood sugar checked regularly if you have high blood pressure, high cholesterol, have  a family history of diabetes or if you are overweight.  Screening for Colon Cancer- Colonoscopy starting at age 81.  Screening may begin sooner depending on your family history and other health conditions. Follow up colonoscopy as directed by your Gastroenterologist.  Screening for Prostate Cancer- Both blood work (PSA) and a rectal exam help screen for Prostate Cancer.  Screening begins at age 53 with African-American men and at age 3 with Caucasian men.  Screening may begin sooner depending on your family history.  Take these medicines  Aspirin- One aspirin daily can help prevent Heart disease and Stroke.  Flu shot- Every fall.  Tetanus- Every 10 years.  Zostavax- Once after the age of 54 to prevent Shingles.  Pneumonia shot- Once after the age of 73; if you are younger than 9, ask your healthcare provider if you need a Pneumonia shot.  Take these steps  Don't smoke- If you do smoke, talk to your doctor about quitting.  For tips on how to quit, go to www.smokefree.gov or call 1-800-QUIT-NOW.  Be physically active- Exercise 5 days a week for at least 30 minutes.  If you are not already physically active start slow and gradually work up to 30 minutes of moderate physical activity.  Examples of moderate activity include walking briskly, mowing the yard, dancing, swimming, bicycling, etc.  Eat a healthy diet- Eat a variety of healthy food such as fruits, vegetables, low fat milk, low fat cheese, yogurt, lean meant, poultry, fish, beans, tofu, etc. For more information go to  www.thenutritionsource.org  Drink alcohol in moderation- Limit alcohol intake to less than two drinks a day. Never drink and drive.  Dentist- Brush and floss twice daily; visit your dentist twice a year.  Depression- Your emotional health is as important as your physical health. If you're feeling down, or losing interest in things you would normally enjoy please talk to your healthcare provider.  Eye exam- Visit your eye doctor every year.  Safe sex- If you may be exposed to a sexually transmitted infection, use a condom.  Seat belts- Seat belts can save your life; always wear one.  Smoke/Carbon Monoxide detectors- These detectors need to be installed on the appropriate level of your home.  Replace batteries at least once a year.  Skin cancer- When out in the sun, cover up and use sunscreen 15 SPF or higher.  Violence- If anyone is threatening you, please tell your healthcare provider.  Living Will/ Health care power of attorney- Speak with your healthcare provider and family.  Impingement Syndrome, Rotator Cuff, Bursitis with Rehab Impingement syndrome is a condition that involves inflammation of the tendons of the rotator cuff and the subacromial bursa, that causes pain in the shoulder. The rotator cuff consists of four tendons and muscles that control much of the shoulder and upper arm function. The subacromial bursa is a fluid filled sac that helps reduce friction between the rotator cuff and one of the bones of the shoulder (acromion). Impingement syndrome is usually an overuse injury that causes swelling of the bursa (bursitis), swelling of the tendon (tendonitis), and/or a tear of the tendon (strain). Strains are classified into three categories. Grade 1 strains cause pain, but the tendon is not lengthened. Grade 2 strains include a lengthened ligament, due to the ligament being stretched or partially ruptured. With grade 2 strains there is still function, although the function may be  decreased. Grade 3 strains include a complete tear of the tendon or muscle, and function is usually  impaired. SYMPTOMS  7. Pain around the shoulder, often at the outer portion of the upper arm. 8. Pain that gets worse with shoulder function, especially when reaching overhead or lifting. 9. Sometimes, aching when not using the arm. 10. Pain that wakes you up at night. 11. Sometimes, tenderness, swelling, warmth, or redness over the affected area. 12. Loss of strength. 13. Limited motion of the shoulder, especially reaching behind the back (to the back pocket or to unhook bra) or across your body. 14. Crackling sound (crepitation) when moving the arm. 15. Biceps tendon pain and inflammation (in the front of the shoulder). Worse when bending the elbow or lifting. CAUSES  Impingement syndrome is often an overuse injury, in which chronic (repetitive) motions cause the tendons or bursa to become inflamed. A strain occurs when a force is paced on the tendon or muscle that is greater than it can withstand. Common mechanisms of injury include: Stress from sudden increase in duration, frequency, or intensity of training. 6. Direct hit (trauma) to the shoulder. 7. Aging, erosion of the tendon with normal use. 8. Bony bump on shoulder (acromial spur). RISK INCREASES WITH: 14. Contact sports (football, wrestling, boxing). 15. Throwing sports (baseball, tennis, volleyball). 40. Weightlifting and bodybuilding. 17. Heavy labor. 18. Previous injury to the rotator cuff, including impingement. 19. Poor shoulder strength and flexibility. 20. Failure to warm up properly before activity. 21. Inadequate protective equipment. 47. Old age. 23. Bony bump on shoulder (acromial spur). PREVENTION   Warm up and stretch properly before activity.  Allow for adequate recovery between workouts.  Maintain physical fitness:  Strength, flexibility, and endurance.  Cardiovascular fitness.  Learn and use proper  exercise technique. PROGNOSIS  If treated properly, impingement syndrome usually goes away within 6 weeks. Sometimes surgery is required.  RELATED COMPLICATIONS   Longer healing time if not properly treated, or if not given enough time to heal.  Recurring symptoms, that result in a chronic condition.  Shoulder stiffness, frozen shoulder, or loss of motion.  Rotator cuff tendon tear.  Recurring symptoms, especially if activity is resumed too soon, with overuse, with a direct blow, or when using poor technique. TREATMENT  Treatment first involves the use of ice and medicine, to reduce pain and inflammation. The use of strengthening and stretching exercises may help reduce pain with activity. These exercises may be performed at home or with a therapist. If non-surgical treatment is unsuccessful after more than 6 months, surgery may be advised. After surgery and rehabilitation, activity is usually possible in 3 months.  MEDICATION  If pain medicine is needed, nonsteroidal anti-inflammatory medicines (aspirin and ibuprofen), or other minor pain relievers (acetaminophen), are often advised.  Do not take pain medicine for 7 days before surgery.  Prescription pain relievers may be given, if your caregiver thinks they are needed. Use only as directed and only as much as you need.  Corticosteroid injections may be given by your caregiver. These injections should be reserved for the most serious cases, because they may only be given a certain number of times. HEAT AND COLD  Cold treatment (icing) should be applied for 10 to 15 minutes every 2 to 3 hours for inflammation and pain, and immediately after activity that aggravates your symptoms. Use ice packs or an ice massage.  Heat treatment may be used before performing stretching and strengthening activities prescribed by your caregiver, physical therapist, or athletic trainer. Use a heat pack or a warm water soak. SEEK MEDICAL CARE IF:  Symptoms get worse or do not improve in 4 to 6 weeks, despite treatment.  New, unexplained symptoms develop. (Drugs used in treatment may produce side effects.) EXERCISES  RANGE OF MOTION (ROM) AND STRETCHING EXERCISES - Impingement Syndrome (Rotator Cuff  Tendinitis, Bursitis) These exercises may help you when beginning to rehabilitate your injury. Your symptoms may go away with or without further involvement from your physician, physical therapist or athletic trainer. While completing these exercises, remember:   Restoring tissue flexibility helps normal motion to return to the joints. This allows healthier, less painful movement and activity.  An effective stretch should be held for at least 30 seconds.  A stretch should never be painful. You should only feel a gentle lengthening or release in the stretched tissue. STRETCH - Flexion, Standing  Stand with good posture. With an underhand grip on your right / left hand, and an overhand grip on the opposite hand, grasp a broomstick or cane so that your hands are a little more than shoulder width apart.  Keeping your right / left elbow straight and shoulder muscles relaxed, push the stick with your opposite hand, to raise your right / left arm in front of your body and then overhead. Raise your arm until you feel a stretch in your right / left shoulder, but before you have increased shoulder pain.  Try to avoid shrugging your right / left shoulder as your arm rises, by keeping your shoulder blade tucked down and toward your mid-back spine. Hold for __________ seconds.  Slowly return to the starting position. Repeat __________ times. Complete this exercise __________ times per day. STRETCH - Abduction, Supine  Lie on your back. With an underhand grip on your right / left hand and an overhand grip on the opposite hand, grasp a broomstick or cane so that your hands are a little more than shoulder width apart.  Keeping your right / left  elbow straight and your shoulder muscles relaxed, push the stick with your opposite hand, to raise your right / left arm out to the side of your body and then overhead. Raise your arm until you feel a stretch in your right / left shoulder, but before you have increased shoulder pain.  Try to avoid shrugging your right / left shoulder as your arm rises, by keeping your shoulder blade tucked down and toward your mid-back spine. Hold for __________ seconds.  Slowly return to the starting position. Repeat __________ times. Complete this exercise __________ times per day. ROM - Flexion, Active-Assisted  Lie on your back. You may bend your knees for comfort.  Grasp a broomstick or cane so your hands are about shoulder width apart. Your right / left hand should grip the end of the stick, so that your hand is positioned "thumbs-up," as if you were about to shake hands.  Using your healthy arm to lead, raise your right / left arm overhead, until you feel a gentle stretch in your shoulder. Hold for __________ seconds.  Use the stick to assist in returning your right / left arm to its starting position. Repeat __________ times. Complete this exercise __________ times per day.  ROM - Internal Rotation, Supine   Lie on your back on a firm surface. Place your right / left elbow about 60 degrees away from your side. Elevate your elbow with a folded towel, so that the elbow and shoulder are the same height.  Using a broomstick or cane and your strong arm, pull your right / left hand  toward your body until you feel a gentle stretch, but no increase in your shoulder pain. Keep your shoulder and elbow in place throughout the exercise.  Hold for __________ seconds. Slowly return to the starting position. Repeat __________ times. Complete this exercise __________ times per day. STRETCH - Internal Rotation  Place your right / left hand behind your back, palm up.  Throw a towel or belt over your opposite  shoulder. Grasp the towel with your right / left hand.  While keeping an upright posture, gently pull up on the towel, until you feel a stretch in the front of your right / left shoulder.  Avoid shrugging your right / left shoulder as your arm rises, by keeping your shoulder blade tucked down and toward your mid-back spine.  Hold for __________ seconds. Release the stretch, by lowering your healthy hand. Repeat __________ times. Complete this exercise __________ times per day. ROM - Internal Rotation   Using an underhand grip, grasp a stick behind your back with both hands.  While standing upright with good posture, slide the stick up your back until you feel a mild stretch in the front of your shoulder.  Hold for __________ seconds. Slowly return to your starting position. Repeat __________ times. Complete this exercise __________ times per day.  STRETCH - Posterior Shoulder Capsule   Stand or sit with good posture. Grasp your right / left elbow and draw it across your chest, keeping it at the same height as your shoulder.  Pull your elbow, so your upper arm comes in closer to your chest. Pull until you feel a gentle stretch in the back of your shoulder.  Hold for __________ seconds. Repeat __________ times. Complete this exercise __________ times per day. STRENGTHENING EXERCISES - Impingement Syndrome (Rotator Cuff Tendinitis, Bursitis) These exercises may help you when beginning to rehabilitate your injury. They may resolve your symptoms with or without further involvement from your physician, physical therapist or athletic trainer. While completing these exercises, remember:  Muscles can gain both the endurance and the strength needed for everyday activities through controlled exercises.  Complete these exercises as instructed by your physician, physical therapist or athletic trainer. Increase the resistance and repetitions only as guided.  You may experience muscle soreness or  fatigue, but the pain or discomfort you are trying to eliminate should never worsen during these exercises. If this pain does get worse, stop and make sure you are following the directions exactly. If the pain is still present after adjustments, discontinue the exercise until you can discuss the trouble with your clinician.  During your recovery, avoid activity or exercises which involve actions that place your injured hand or elbow above your head or behind your back or head. These positions stress the tissues which you are trying to heal. STRENGTH - Scapular Depression and Adduction   With good posture, sit on a firm chair. Support your arms in front of you, with pillows, arm rests, or on a table top. Have your elbows in line with the sides of your body.  Gently draw your shoulder blades down and toward your mid-back spine. Gradually increase the tension, without tensing the muscles along the top of your shoulders and the back of your neck.  Hold for __________ seconds. Slowly release the tension and relax your muscles completely before starting the next repetition.  After you have practiced this exercise, remove the arm support and complete the exercise in standing as well as sitting position. Repeat __________ times. Complete this  exercise __________ times per day.  STRENGTH - Shoulder Abductors, Isometric  With good posture, stand or sit about 4-6 inches from a wall, with your right / left side facing the wall.  Bend your right / left elbow. Gently press your right / left elbow into the wall. Increase the pressure gradually, until you are pressing as hard as you can, without shrugging your shoulder or increasing any shoulder discomfort.  Hold for __________ seconds.  Release the tension slowly. Relax your shoulder muscles completely before you begin the next repetition. Repeat __________ times. Complete this exercise __________ times per day.  STRENGTH - External Rotators,  Isometric  Keep your right / left elbow at your side and bend it 90 degrees.  Step into a door frame so that the outside of your right / left wrist can press against the door frame without your upper arm leaving your side.  Gently press your right / left wrist into the door frame, as if you were trying to swing the back of your hand away from your stomach. Gradually increase the tension, until you are pressing as hard as you can, without shrugging your shoulder or increasing any shoulder discomfort.  Hold for __________ seconds.  Release the tension slowly. Relax your shoulder muscles completely before you begin the next repetition. Repeat __________ times. Complete this exercise __________ times per day.  STRENGTH - Supraspinatus   Stand or sit with good posture. Grasp a __________ weight, or an exercise band or tubing, so that your hand is "thumbs-up," like you are shaking hands.  Slowly lift your right / left arm in a "V" away from your thigh, diagonally into the space between your side and straight ahead. Lift your hand to shoulder height or as far as you can, without increasing any shoulder pain. At first, many people do not lift their hands above shoulder height.  Avoid shrugging your right / left shoulder as your arm rises, by keeping your shoulder blade tucked down and toward your mid-back spine.  Hold for __________ seconds. Control the descent of your hand, as you slowly return to your starting position. Repeat __________ times. Complete this exercise __________ times per day.  STRENGTH - External Rotators  Secure a rubber exercise band or tubing to a fixed object (table, pole) so that it is at the same height as your right / left elbow when you are standing or sitting on a firm surface.  Stand or sit so that the secured exercise band is at your uninjured side.  Bend your right / left elbow 90 degrees. Place a folded towel or small pillow under your right / left arm, so that  your elbow is a few inches away from your side.  Keeping the tension on the exercise band, pull it away from your body, as if pivoting on your elbow. Be sure to keep your body steady, so that the movement is coming only from your rotating shoulder.  Hold for __________ seconds. Release the tension in a controlled manner, as you return to the starting position. Repeat __________ times. Complete this exercise __________ times per day.  STRENGTH - Internal Rotators   Secure a rubber exercise band or tubing to a fixed object (table, pole) so that it is at the same height as your right / left elbow when you are standing or sitting on a firm surface.  Stand or sit so that the secured exercise band is at your right / left side.  Bend your elbow  90 degrees. Place a folded towel or small pillow under your right / left arm so that your elbow is a few inches away from your side.  Keeping the tension on the exercise band, pull it across your body, toward your stomach. Be sure to keep your body steady, so that the movement is coming only from your rotating shoulder.  Hold for __________ seconds. Release the tension in a controlled manner, as you return to the starting position. Repeat __________ times. Complete this exercise __________ times per day.  STRENGTH - Scapular Protractors, Standing   Stand arms length away from a wall. Place your hands on the wall, keeping your elbows straight.  Begin by dropping your shoulder blades down and toward your mid-back spine.  To strengthen your protractors, keep your shoulder blades down, but slide them forward on your rib cage. It will feel as if you are lifting the back of your rib cage away from the wall. This is a subtle motion and can be challenging to complete. Ask your caregiver for further instruction, if you are not sure you are doing the exercise correctly.  Hold for __________ seconds. Slowly return to the starting position, resting the muscles  completely before starting the next repetition. Repeat __________ times. Complete this exercise __________ times per day. STRENGTH - Scapular Protractors, Supine  Lie on your back on a firm surface. Extend your right / left arm straight into the air while holding a __________ weight in your hand.  Keeping your head and back in place, lift your shoulder off the floor.  Hold for __________ seconds. Slowly return to the starting position, and allow your muscles to relax completely before starting the next repetition. Repeat __________ times. Complete this exercise __________ times per day. STRENGTH - Scapular Protractors, Quadruped  Get onto your hands and knees, with your shoulders directly over your hands (or as close as you can be, comfortably).  Keeping your elbows locked, lift the back of your rib cage up into your shoulder blades, so your mid-back rounds out. Keep your neck muscles relaxed.  Hold this position for __________ seconds. Slowly return to the starting position and allow your muscles to relax completely before starting the next repetition. Repeat __________ times. Complete this exercise __________ times per day.  STRENGTH - Scapular Retractors  Secure a rubber exercise band or tubing to a fixed object (table, pole), so that it is at the height of your shoulders when you are either standing, or sitting on a firm armless chair.  With a palm down grip, grasp an end of the band in each hand. Straighten your elbows and lift your hands straight in front of you, at shoulder height. Step back, away from the secured end of the band, until it becomes tense.  Squeezing your shoulder blades together, draw your elbows back toward your sides, as you bend them. Keep your upper arms lifted away from your body throughout the exercise.  Hold for __________ seconds. Slowly ease the tension on the band, as you reverse the directions and return to the starting position. Repeat __________ times.  Complete this exercise __________ times per day. STRENGTH - Shoulder Extensors   Secure a rubber exercise band or tubing to a fixed object (table, pole) so that it is at the height of your shoulders when you are either standing, or sitting on a firm armless chair.  With a thumbs-up grip, grasp an end of the band in each hand. Straighten your elbows and lift your  hands straight in front of you, at shoulder height. Step back, away from the secured end of the band, until it becomes tense.  Squeezing your shoulder blades together, pull your hands down to the sides of your thighs. Do not allow your hands to go behind you.  Hold for __________ seconds. Slowly ease the tension on the band, as you reverse the directions and return to the starting position. Repeat __________ times. Complete this exercise __________ times per day.  STRENGTH - Scapular Retractors and External Rotators   Secure a rubber exercise band or tubing to a fixed object (table, pole) so that it is at the height as your shoulders, when you are either standing, or sitting on a firm armless chair.  With a palm down grip, grasp an end of the band in each hand. Bend your elbows 90 degrees and lift your elbows to shoulder height, at your sides. Step back, away from the secured end of the band, until it becomes tense.  Squeezing your shoulder blades together, rotate your shoulders so that your upper arms and elbows remain stationary, but your fists travel upward to head height.  Hold for __________ seconds. Slowly ease the tension on the band, as you reverse the directions and return to the starting position. Repeat __________ times. Complete this exercise __________ times per day.  STRENGTH - Scapular Retractors and External Rotators, Rowing   Secure a rubber exercise band or tubing to a fixed object (table, pole) so that it is at the height of your shoulders, when you are either standing, or sitting on a firm armless chair.  With a  palm down grip, grasp an end of the band in each hand. Straighten your elbows and lift your hands straight in front of you, at shoulder height. Step back, away from the secured end of the band, until it becomes tense.  Step 1: Squeeze your shoulder blades together. Bending your elbows, draw your hands to your chest, as if you are rowing a boat. At the end of this motion, your hands and elbow should be at shoulder height and your elbows should be out to your sides.  Step 2: Rotate your shoulders, to raise your hands above your head. Your forearms should be vertical and your upper arms should be horizontal.  Hold for __________ seconds. Slowly ease the tension on the band, as you reverse the directions and return to the starting position. Repeat __________ times. Complete this exercise __________ times per day.  STRENGTH - Scapular Depressors  Find a sturdy chair without wheels, such as a dining room chair.  Keeping your feet on the floor, and your hands on the chair arms, lift your bottom up from the seat, and lock your elbows.  Keeping your elbows straight, allow gravity to pull your body weight down. Your shoulders will rise toward your ears.  Raise your body against gravity by drawing your shoulder blades down your back, shortening the distance between your shoulders and ears. Although your feet should always maintain contact with the floor, your feet should progressively support less body weight, as you get stronger.  Hold for __________ seconds. In a controlled and slow manner, lower your body weight to begin the next repetition. Repeat __________ times. Complete this exercise __________ times per day.  Document Released: 03/17/2005 Document Revised: 06/09/2011 Document Reviewed: 06/29/2008 [facility] Patient Information 2015 [place], [place]. This information is not intended to replace advice given to you by your health care provider. Make sure you discuss any  questions you have with your  health care provider.          I personally performed the services described in this documentation, which was scribed in my presence. The recorded information has been reviewed and considered, and addended by me as needed.

## 2014-04-24 NOTE — Patient Instructions (Addendum)
I will refer you to nutritionist to discuss diet for diabetes. continue exercise.  Can stop omeprazole in next 2 weeks - if heartburn or abdominal symptoms return - you need to meet with gastroenterologist again.  You should receive a call or letter about your lab results within the next week to 10 days.   Decrease weightlifting for next 2-3 weeks as shoulder improves, then exercises below. Return to the clinic or go to the nearest emergency room if any of your symptoms worsen or new symptoms occur.  Keeping you healthy  Get these tests  Blood pressure- Have your blood pressure checked once a year by your healthcare provider.  Normal blood pressure is 120/80  Weight- Have your body mass index (BMI) calculated to screen for obesity.  BMI is a measure of body fat based on height and weight. You can also calculate your own BMI at [email or web address].  Cholesterol- Have your cholesterol checked every year.  Diabetes- Have your blood sugar checked regularly if you have high blood pressure, high cholesterol, have a family history of diabetes or if you are overweight.  Screening for Colon Cancer- Colonoscopy starting at age 42.  Screening may begin sooner depending on your family history and other health conditions. Follow up colonoscopy as directed by your Gastroenterologist.  Screening for Prostate Cancer- Both blood work (PSA) and a rectal exam help screen for Prostate Cancer.  Screening begins at age 33 with African-American men and at age 70 with Caucasian men.  Screening may begin sooner depending on your family history.  Take these medicines  Aspirin- One aspirin daily can help prevent Heart disease and Stroke.  Flu shot- Every fall.  Tetanus- Every 10 years.  Zostavax- Once after the age of 63 to prevent Shingles.  Pneumonia shot- Once after the age of 19; if you are younger than 42, ask your healthcare provider if you need a Pneumonia shot.  Take these steps  Don't smoke-  If you do smoke, talk to your doctor about quitting.  For tips on how to quit, go to www.smokefree.gov or call 1-800-QUIT-NOW.  Be physically active- Exercise 5 days a week for at least 30 minutes.  If you are not already physically active start slow and gradually work up to 30 minutes of moderate physical activity.  Examples of moderate activity include walking briskly, mowing the yard, dancing, swimming, bicycling, etc.  Eat a healthy diet- Eat a variety of healthy food such as fruits, vegetables, low fat milk, low fat cheese, yogurt, lean meant, poultry, fish, beans, tofu, etc. For more information go to www.thenutritionsource.org  Drink alcohol in moderation- Limit alcohol intake to less than two drinks a day. Never drink and drive.  Dentist- Brush and floss twice daily; visit your dentist twice a year.  Depression- Your emotional health is as important as your physical health. If you're feeling down, or losing interest in things you would normally enjoy please talk to your healthcare provider.  Eye exam- Visit your eye doctor every year.  Safe sex- If you may be exposed to a sexually transmitted infection, use a condom.  Seat belts- Seat belts can save your life; always wear one.  Smoke/Carbon Monoxide detectors- These detectors need to be installed on the appropriate level of your home.  Replace batteries at least once a year.  Skin cancer- When out in the sun, cover up and use sunscreen 15 SPF or higher.  Violence- If anyone is threatening you, please tell your healthcare provider.  Living Will/  Health care power of attorney- Speak with your healthcare provider and family.  Impingement Syndrome, Rotator Cuff, Bursitis with Rehab Impingement syndrome is a condition that involves inflammation of the tendons of the rotator cuff and the subacromial bursa, that causes pain in the shoulder. The rotator cuff consists of four tendons and muscles that control much of the shoulder and upper  arm function. The subacromial bursa is a fluid filled sac that helps reduce friction between the rotator cuff and one of the bones of the shoulder (acromion). Impingement syndrome is usually an overuse injury that causes swelling of the bursa (bursitis), swelling of the tendon (tendonitis), and/or a tear of the tendon (strain). Strains are classified into three categories. Grade 1 strains cause pain, but the tendon is not lengthened. Grade 2 strains include a lengthened ligament, due to the ligament being stretched or partially ruptured. With grade 2 strains there is still function, although the function may be decreased. Grade 3 strains include a complete tear of the tendon or muscle, and function is usually impaired. SYMPTOMS  7. Pain around the shoulder, often at the outer portion of the upper arm. 8. Pain that gets worse with shoulder function, especially when reaching overhead or lifting. 9. Sometimes, aching when not using the arm. 10. Pain that wakes you up at night. 11. Sometimes, tenderness, swelling, warmth, or redness over the affected area. 12. Loss of strength. 13. Limited motion of the shoulder, especially reaching behind the back (to the back pocket or to unhook bra) or across your body. 14. Crackling sound (crepitation) when moving the arm. 15. Biceps tendon pain and inflammation (in the front of the shoulder). Worse when bending the elbow or lifting. CAUSES  Impingement syndrome is often an overuse injury, in which chronic (repetitive) motions cause the tendons or bursa to become inflamed. A strain occurs when a force is paced on the tendon or muscle that is greater than it can withstand. Common mechanisms of injury include: Stress from sudden increase in duration, frequency, or intensity of training. 6. Direct hit (trauma) to the shoulder. 7. Aging, erosion of the tendon with normal use. 8. Bony bump on shoulder (acromial spur). RISK INCREASES WITH: 14. Contact sports (football,  wrestling, boxing). 15. Throwing sports (baseball, tennis, volleyball). 67. Weightlifting and bodybuilding. 17. Heavy labor. 18. Previous injury to the rotator cuff, including impingement. 19. Poor shoulder strength and flexibility. 20. Failure to warm up properly before activity. 21. Inadequate protective equipment. 90. Old age. 23. Bony bump on shoulder (acromial spur). PREVENTION   Warm up and stretch properly before activity.  Allow for adequate recovery between workouts.  Maintain physical fitness:  Strength, flexibility, and endurance.  Cardiovascular fitness.  Learn and use proper exercise technique. PROGNOSIS  If treated properly, impingement syndrome usually goes away within 6 weeks. Sometimes surgery is required.  RELATED COMPLICATIONS   Longer healing time if not properly treated, or if not given enough time to heal.  Recurring symptoms, that result in a chronic condition.  Shoulder stiffness, frozen shoulder, or loss of motion.  Rotator cuff tendon tear.  Recurring symptoms, especially if activity is resumed too soon, with overuse, with a direct blow, or when using poor technique. TREATMENT  Treatment first involves the use of ice and medicine, to reduce pain and inflammation. The use of strengthening and stretching exercises may help reduce pain with activity. These exercises may be performed at home or with a therapist. If non-surgical treatment is unsuccessful after more than 6 months, surgery  may be advised. After surgery and rehabilitation, activity is usually possible in 3 months.  MEDICATION  If pain medicine is needed, nonsteroidal anti-inflammatory medicines (aspirin and ibuprofen), or other minor pain relievers (acetaminophen), are often advised.  Do not take pain medicine for 7 days before surgery.  Prescription pain relievers may be given, if your caregiver thinks they are needed. Use only as directed and only as much as you need.  Corticosteroid  injections may be given by your caregiver. These injections should be reserved for the most serious cases, because they may only be given a certain number of times. HEAT AND COLD  Cold treatment (icing) should be applied for 10 to 15 minutes every 2 to 3 hours for inflammation and pain, and immediately after activity that aggravates your symptoms. Use ice packs or an ice massage.  Heat treatment may be used before performing stretching and strengthening activities prescribed by your caregiver, physical therapist, or athletic trainer. Use a heat pack or a warm water soak. SEEK MEDICAL CARE IF:   Symptoms get worse or do not improve in 4 to 6 weeks, despite treatment.  New, unexplained symptoms develop. (Drugs used in treatment may produce side effects.) EXERCISES  RANGE OF MOTION (ROM) AND STRETCHING EXERCISES - Impingement Syndrome (Rotator Cuff  Tendinitis, Bursitis) These exercises may help you when beginning to rehabilitate your injury. Your symptoms may go away with or without further involvement from your physician, physical therapist or athletic trainer. While completing these exercises, remember:   Restoring tissue flexibility helps normal motion to return to the joints. This allows healthier, less painful movement and activity.  An effective stretch should be held for at least 30 seconds.  A stretch should never be painful. You should only feel a gentle lengthening or release in the stretched tissue. STRETCH - Flexion, Standing  Stand with good posture. With an underhand grip on your right / left hand, and an overhand grip on the opposite hand, grasp a broomstick or cane so that your hands are a little more than shoulder width apart.  Keeping your right / left elbow straight and shoulder muscles relaxed, push the stick with your opposite hand, to raise your right / left arm in front of your body and then overhead. Raise your arm until you feel a stretch in your right / left shoulder,  but before you have increased shoulder pain.  Try to avoid shrugging your right / left shoulder as your arm rises, by keeping your shoulder blade tucked down and toward your mid-back spine. Hold for __________ seconds.  Slowly return to the starting position. Repeat __________ times. Complete this exercise __________ times per day. STRETCH - Abduction, Supine  Lie on your back. With an underhand grip on your right / left hand and an overhand grip on the opposite hand, grasp a broomstick or cane so that your hands are a little more than shoulder width apart.  Keeping your right / left elbow straight and your shoulder muscles relaxed, push the stick with your opposite hand, to raise your right / left arm out to the side of your body and then overhead. Raise your arm until you feel a stretch in your right / left shoulder, but before you have increased shoulder pain.  Try to avoid shrugging your right / left shoulder as your arm rises, by keeping your shoulder blade tucked down and toward your mid-back spine. Hold for __________ seconds.  Slowly return to the starting position. Repeat __________ times. Complete  this exercise __________ times per day. ROM - Flexion, Active-Assisted  Lie on your back. You may bend your knees for comfort.  Grasp a broomstick or cane so your hands are about shoulder width apart. Your right / left hand should grip the end of the stick, so that your hand is positioned "thumbs-up," as if you were about to shake hands.  Using your healthy arm to lead, raise your right / left arm overhead, until you feel a gentle stretch in your shoulder. Hold for __________ seconds.  Use the stick to assist in returning your right / left arm to its starting position. Repeat __________ times. Complete this exercise __________ times per day.  ROM - Internal Rotation, Supine   Lie on your back on a firm surface. Place your right / left elbow about 60 degrees away from your side. Elevate  your elbow with a folded towel, so that the elbow and shoulder are the same height.  Using a broomstick or cane and your strong arm, pull your right / left hand toward your body until you feel a gentle stretch, but no increase in your shoulder pain. Keep your shoulder and elbow in place throughout the exercise.  Hold for __________ seconds. Slowly return to the starting position. Repeat __________ times. Complete this exercise __________ times per day. STRETCH - Internal Rotation  Place your right / left hand behind your back, palm up.  Throw a towel or belt over your opposite shoulder. Grasp the towel with your right / left hand.  While keeping an upright posture, gently pull up on the towel, until you feel a stretch in the front of your right / left shoulder.  Avoid shrugging your right / left shoulder as your arm rises, by keeping your shoulder blade tucked down and toward your mid-back spine.  Hold for __________ seconds. Release the stretch, by lowering your healthy hand. Repeat __________ times. Complete this exercise __________ times per day. ROM - Internal Rotation   Using an underhand grip, grasp a stick behind your back with both hands.  While standing upright with good posture, slide the stick up your back until you feel a mild stretch in the front of your shoulder.  Hold for __________ seconds. Slowly return to your starting position. Repeat __________ times. Complete this exercise __________ times per day.  STRETCH - Posterior Shoulder Capsule   Stand or sit with good posture. Grasp your right / left elbow and draw it across your chest, keeping it at the same height as your shoulder.  Pull your elbow, so your upper arm comes in closer to your chest. Pull until you feel a gentle stretch in the back of your shoulder.  Hold for __________ seconds. Repeat __________ times. Complete this exercise __________ times per day. STRENGTHENING EXERCISES - Impingement Syndrome  (Rotator Cuff Tendinitis, Bursitis) These exercises may help you when beginning to rehabilitate your injury. They may resolve your symptoms with or without further involvement from your physician, physical therapist or athletic trainer. While completing these exercises, remember:  Muscles can gain both the endurance and the strength needed for everyday activities through controlled exercises.  Complete these exercises as instructed by your physician, physical therapist or athletic trainer. Increase the resistance and repetitions only as guided.  You may experience muscle soreness or fatigue, but the pain or discomfort you are trying to eliminate should never worsen during these exercises. If this pain does get worse, stop and make sure you are following the directions exactly.  If the pain is still present after adjustments, discontinue the exercise until you can discuss the trouble with your clinician.  During your recovery, avoid activity or exercises which involve actions that place your injured hand or elbow above your head or behind your back or head. These positions stress the tissues which you are trying to heal. STRENGTH - Scapular Depression and Adduction   With good posture, sit on a firm chair. Support your arms in front of you, with pillows, arm rests, or on a table top. Have your elbows in line with the sides of your body.  Gently draw your shoulder blades down and toward your mid-back spine. Gradually increase the tension, without tensing the muscles along the top of your shoulders and the back of your neck.  Hold for __________ seconds. Slowly release the tension and relax your muscles completely before starting the next repetition.  After you have practiced this exercise, remove the arm support and complete the exercise in standing as well as sitting position. Repeat __________ times. Complete this exercise __________ times per day.  STRENGTH - Shoulder Abductors, Isometric  With  good posture, stand or sit about 4-6 inches from a wall, with your right / left side facing the wall.  Bend your right / left elbow. Gently press your right / left elbow into the wall. Increase the pressure gradually, until you are pressing as hard as you can, without shrugging your shoulder or increasing any shoulder discomfort.  Hold for __________ seconds.  Release the tension slowly. Relax your shoulder muscles completely before you begin the next repetition. Repeat __________ times. Complete this exercise __________ times per day.  STRENGTH - External Rotators, Isometric  Keep your right / left elbow at your side and bend it 90 degrees.  Step into a door frame so that the outside of your right / left wrist can press against the door frame without your upper arm leaving your side.  Gently press your right / left wrist into the door frame, as if you were trying to swing the back of your hand away from your stomach. Gradually increase the tension, until you are pressing as hard as you can, without shrugging your shoulder or increasing any shoulder discomfort.  Hold for __________ seconds.  Release the tension slowly. Relax your shoulder muscles completely before you begin the next repetition. Repeat __________ times. Complete this exercise __________ times per day.  STRENGTH - Supraspinatus   Stand or sit with good posture. Grasp a __________ weight, or an exercise band or tubing, so that your hand is "thumbs-up," like you are shaking hands.  Slowly lift your right / left arm in a "V" away from your thigh, diagonally into the space between your side and straight ahead. Lift your hand to shoulder height or as far as you can, without increasing any shoulder pain. At first, many people do not lift their hands above shoulder height.  Avoid shrugging your right / left shoulder as your arm rises, by keeping your shoulder blade tucked down and toward your mid-back spine.  Hold for __________  seconds. Control the descent of your hand, as you slowly return to your starting position. Repeat __________ times. Complete this exercise __________ times per day.  STRENGTH - External Rotators  Secure a rubber exercise band or tubing to a fixed object (table, pole) so that it is at the same height as your right / left elbow when you are standing or sitting on a firm surface.  Stand  or sit so that the secured exercise band is at your uninjured side.  Bend your right / left elbow 90 degrees. Place a folded towel or small pillow under your right / left arm, so that your elbow is a few inches away from your side.  Keeping the tension on the exercise band, pull it away from your body, as if pivoting on your elbow. Be sure to keep your body steady, so that the movement is coming only from your rotating shoulder.  Hold for __________ seconds. Release the tension in a controlled manner, as you return to the starting position. Repeat __________ times. Complete this exercise __________ times per day.  STRENGTH - Internal Rotators   Secure a rubber exercise band or tubing to a fixed object (table, pole) so that it is at the same height as your right / left elbow when you are standing or sitting on a firm surface.  Stand or sit so that the secured exercise band is at your right / left side.  Bend your elbow 90 degrees. Place a folded towel or small pillow under your right / left arm so that your elbow is a few inches away from your side.  Keeping the tension on the exercise band, pull it across your body, toward your stomach. Be sure to keep your body steady, so that the movement is coming only from your rotating shoulder.  Hold for __________ seconds. Release the tension in a controlled manner, as you return to the starting position. Repeat __________ times. Complete this exercise __________ times per day.  STRENGTH - Scapular Protractors, Standing   Stand arms length away from a wall. Place your  hands on the wall, keeping your elbows straight.  Begin by dropping your shoulder blades down and toward your mid-back spine.  To strengthen your protractors, keep your shoulder blades down, but slide them forward on your rib cage. It will feel as if you are lifting the back of your rib cage away from the wall. This is a subtle motion and can be challenging to complete. Ask your caregiver for further instruction, if you are not sure you are doing the exercise correctly.  Hold for __________ seconds. Slowly return to the starting position, resting the muscles completely before starting the next repetition. Repeat __________ times. Complete this exercise __________ times per day. STRENGTH - Scapular Protractors, Supine  Lie on your back on a firm surface. Extend your right / left arm straight into the air while holding a __________ weight in your hand.  Keeping your head and back in place, lift your shoulder off the floor.  Hold for __________ seconds. Slowly return to the starting position, and allow your muscles to relax completely before starting the next repetition. Repeat __________ times. Complete this exercise __________ times per day. STRENGTH - Scapular Protractors, Quadruped  Get onto your hands and knees, with your shoulders directly over your hands (or as close as you can be, comfortably).  Keeping your elbows locked, lift the back of your rib cage up into your shoulder blades, so your mid-back rounds out. Keep your neck muscles relaxed.  Hold this position for __________ seconds. Slowly return to the starting position and allow your muscles to relax completely before starting the next repetition. Repeat __________ times. Complete this exercise __________ times per day.  STRENGTH - Scapular Retractors  Secure a rubber exercise band or tubing to a fixed object (table, pole), so that it is at the height of your shoulders when  you are either standing, or sitting on a firm armless  chair.  With a palm down grip, grasp an end of the band in each hand. Straighten your elbows and lift your hands straight in front of you, at shoulder height. Step back, away from the secured end of the band, until it becomes tense.  Squeezing your shoulder blades together, draw your elbows back toward your sides, as you bend them. Keep your upper arms lifted away from your body throughout the exercise.  Hold for __________ seconds. Slowly ease the tension on the band, as you reverse the directions and return to the starting position. Repeat __________ times. Complete this exercise __________ times per day. STRENGTH - Shoulder Extensors   Secure a rubber exercise band or tubing to a fixed object (table, pole) so that it is at the height of your shoulders when you are either standing, or sitting on a firm armless chair.  With a thumbs-up grip, grasp an end of the band in each hand. Straighten your elbows and lift your hands straight in front of you, at shoulder height. Step back, away from the secured end of the band, until it becomes tense.  Squeezing your shoulder blades together, pull your hands down to the sides of your thighs. Do not allow your hands to go behind you.  Hold for __________ seconds. Slowly ease the tension on the band, as you reverse the directions and return to the starting position. Repeat __________ times. Complete this exercise __________ times per day.  STRENGTH - Scapular Retractors and External Rotators   Secure a rubber exercise band or tubing to a fixed object (table, pole) so that it is at the height as your shoulders, when you are either standing, or sitting on a firm armless chair.  With a palm down grip, grasp an end of the band in each hand. Bend your elbows 90 degrees and lift your elbows to shoulder height, at your sides. Step back, away from the secured end of the band, until it becomes tense.  Squeezing your shoulder blades together, rotate your shoulders  so that your upper arms and elbows remain stationary, but your fists travel upward to head height.  Hold for __________ seconds. Slowly ease the tension on the band, as you reverse the directions and return to the starting position. Repeat __________ times. Complete this exercise __________ times per day.  STRENGTH - Scapular Retractors and External Rotators, Rowing   Secure a rubber exercise band or tubing to a fixed object (table, pole) so that it is at the height of your shoulders, when you are either standing, or sitting on a firm armless chair.  With a palm down grip, grasp an end of the band in each hand. Straighten your elbows and lift your hands straight in front of you, at shoulder height. Step back, away from the secured end of the band, until it becomes tense.  Step 1: Squeeze your shoulder blades together. Bending your elbows, draw your hands to your chest, as if you are rowing a boat. At the end of this motion, your hands and elbow should be at shoulder height and your elbows should be out to your sides.  Step 2: Rotate your shoulders, to raise your hands above your head. Your forearms should be vertical and your upper arms should be horizontal.  Hold for __________ seconds. Slowly ease the tension on the band, as you reverse the directions and return to the starting position. Repeat __________ times. Complete this  exercise __________ times per day.  STRENGTH - Scapular Depressors  Find a sturdy chair without wheels, such as a dining room chair.  Keeping your feet on the floor, and your hands on the chair arms, lift your bottom up from the seat, and lock your elbows.  Keeping your elbows straight, allow gravity to pull your body weight down. Your shoulders will rise toward your ears.  Raise your body against gravity by drawing your shoulder blades down your back, shortening the distance between your shoulders and ears. Although your feet should always maintain contact with the  floor, your feet should progressively support less body weight, as you get stronger.  Hold for __________ seconds. In a controlled and slow manner, lower your body weight to begin the next repetition. Repeat __________ times. Complete this exercise __________ times per day.  Document Released: 03/17/2005 Document Revised: 06/09/2011 Document Reviewed: 06/29/2008 [facility] Patient Information 2015 [place], [place]. This information is not intended to replace advice given to you by your health care provider. Make sure you discuss any questions you have with your health care provider.

## 2014-04-24 NOTE — Progress Notes (Signed)
   Subjective:    Patient ID: Douglas [name], male    DOB: 1936/01/23, 79 y.o.   MRN: [id number]  HPI    Review of Systems  Constitutional: Negative.   [name]: Negative.   Eyes: Positive for visual disturbance.  Respiratory: Negative.   Cardiovascular: Negative.   Gastrointestinal: Negative.   Endocrine: Positive for polydipsia.  Genitourinary: Negative.   Musculoskeletal: Positive for arthralgias.  Skin: Negative.   Allergic/Immunologic: Negative.   Neurological: Negative.   Hematological: Negative.   Psychiatric/Behavioral: Negative.        Objective:   Physical Exam        Assessment & Plan:

## 2014-04-25 LAB — MICROALBUMIN, URINE: Microalb, Ur: 0.4 mg/dL (ref <2.0)

## 2014-05-10 ENCOUNTER — Telehealth: Payer: Self-pay

## 2014-05-10 NOTE — Telephone Encounter (Signed)
Pt called to request that Dr. Carlota Raspberry send in the somastatin. Please advise. CB  # [phone]

## 2014-05-11 NOTE — Telephone Encounter (Signed)
Pharm should have Rx on file from 03/08/14 #90 w/1RF. Called pt and gave him this info and he agreed to contact pharmacy.

## 2014-05-13 ENCOUNTER — Encounter: Payer: Self-pay | Admitting: *Deleted

## 2014-05-16 ENCOUNTER — Other Ambulatory Visit: Payer: Self-pay | Admitting: Family Medicine

## 2014-05-17 ENCOUNTER — Telehealth: Payer: Self-pay | Admitting: *Deleted

## 2014-05-17 NOTE — Telephone Encounter (Signed)
Spoke with patient he is still taking prilosec called in one more month. Pt advised

## 2014-07-08 ENCOUNTER — Other Ambulatory Visit: Payer: Self-pay | Admitting: Physician Assistant

## 2014-07-26 DIAGNOSIS — H1032 Unspecified acute conjunctivitis, left eye: Secondary | ICD-10-CM | POA: Diagnosis not present

## 2014-07-26 DIAGNOSIS — H40021 Open angle with borderline findings, high risk, right eye: Secondary | ICD-10-CM | POA: Diagnosis not present

## 2014-08-07 ENCOUNTER — Other Ambulatory Visit: Payer: Self-pay | Admitting: Family Medicine

## 2014-08-16 ENCOUNTER — Ambulatory Visit (INDEPENDENT_AMBULATORY_CARE_PROVIDER_SITE_OTHER): Payer: Medicare Other | Admitting: Family Medicine

## 2014-08-16 VITALS — BP 128/70 | HR 91 | Temp 97.8°F | Resp 16 | Ht 72.0 in | Wt 205.6 lb

## 2014-08-16 DIAGNOSIS — K219 Gastro-esophageal reflux disease without esophagitis: Secondary | ICD-10-CM

## 2014-08-16 DIAGNOSIS — J329 Chronic sinusitis, unspecified: Secondary | ICD-10-CM | POA: Diagnosis not present

## 2014-08-16 MED ORDER — AMOXICILLIN 875 MG PO TABS: 875.0000 mg | ORAL_TABLET | Freq: Two times a day (BID) | ORAL | Status: DC

## 2014-08-16 MED ORDER — PREDNISONE 20 MG PO TABS: ORAL_TABLET | ORAL | Status: DC

## 2014-08-16 NOTE — Progress Notes (Signed)
Subjective: 79 year old man who is here for couple of things. When he had his physical last winter they discussed his reflux issues. He was continued on omeprazole. He says when he takes it regularly he gets a little bit of loosening of the stools. He wondered whether he would need to be on it for life. He has never had an H. pylori test done or had an upper endoscopy.  The main reason he came in acutely today however was because he has had a cough and sinus congestion for the last month. He does not smoke. He is generally healthy.  Objective: His TMs are normal. Throat clear. Neck supple without significant nodes. Chest is clear to auscultation. Heart regular without murmurs. He feels some pressure in his sinuses though is not frankly painful on percussion.  Assessment: Chronic sinusitis, probably maxillary and frontal GERD  Plan: Check H. pylori breath test If H. pylori is negative we'll need to make a referral to gastroenterologist If it is positive we will treat him Treat sinuses with a course of prednisone as well as antibiotics. He is to continue on antihistamines and fluticasone nose spray.  If he is not doing better may need to do sinus x-rays and labs

## 2014-08-16 NOTE — Patient Instructions (Signed)
I will let you know the results of your test for the stomach. If it is positive we will treat you with some medications. If it is negative we will need to refer you to a gastroenterologist to get further evaluated, probably with an upper endoscopy scoping procedure.  Take the antibiotic, amoxicillin, one twice daily  Take the prednisone 3 pills daily for 2 days, then 2 daily for 2 days, then 1 daily for 2 days to try and open up the sinuses.  Continue using your fluticasone nose spray  Take over-the-counter Zyrtec (cetirizine) one pill each night  If you are not improving with these treatments, then we would probably need to do additional studies regarding his sinuses

## 2014-08-17 LAB — H. PYLORI BREATH TEST: H. pylori Breath Test: NOT DETECTED

## 2014-08-23 DIAGNOSIS — M25512 Pain in left shoulder: Secondary | ICD-10-CM | POA: Diagnosis not present

## 2014-09-25 DIAGNOSIS — H1032 Unspecified acute conjunctivitis, left eye: Secondary | ICD-10-CM | POA: Diagnosis not present

## 2014-09-27 DIAGNOSIS — Z6828 Body mass index (BMI) 28.0-28.9, adult: Secondary | ICD-10-CM | POA: Diagnosis not present

## 2014-09-27 DIAGNOSIS — M5412 Radiculopathy, cervical region: Secondary | ICD-10-CM | POA: Diagnosis not present

## 2014-10-03 ENCOUNTER — Other Ambulatory Visit: Payer: Self-pay | Admitting: Neurosurgery

## 2014-10-03 DIAGNOSIS — M5412 Radiculopathy, cervical region: Secondary | ICD-10-CM

## 2014-10-09 DIAGNOSIS — Z961 Presence of intraocular lens: Secondary | ICD-10-CM | POA: Diagnosis not present

## 2014-10-09 DIAGNOSIS — H43813 Vitreous degeneration, bilateral: Secondary | ICD-10-CM | POA: Diagnosis not present

## 2014-10-09 DIAGNOSIS — H35033 Hypertensive retinopathy, bilateral: Secondary | ICD-10-CM | POA: Diagnosis not present

## 2014-10-09 DIAGNOSIS — H40021 Open angle with borderline findings, high risk, right eye: Secondary | ICD-10-CM | POA: Diagnosis not present

## 2014-10-11 ENCOUNTER — Ambulatory Visit
Admission: RE | Admit: 2014-10-11 | Discharge: 2014-10-11 | Disposition: A | Discharge status: Home / Self Care | Payer: Medicare Other | Source: Ambulatory Visit | Attending: Neurosurgery | Admitting: Neurosurgery

## 2014-10-11 DIAGNOSIS — M5412 Radiculopathy, cervical region: Secondary | ICD-10-CM

## 2014-10-11 DIAGNOSIS — M47813 Spondylosis without myelopathy or radiculopathy, cervicothoracic region: Secondary | ICD-10-CM | POA: Diagnosis not present

## 2014-10-11 DIAGNOSIS — M9971 Connective tissue and disc stenosis of intervertebral foramina of cervical region: Secondary | ICD-10-CM | POA: Diagnosis not present

## 2014-10-11 MED ORDER — IOHEXOL 300 MG/ML  SOLN
10.0000 mL | Freq: Once | INTRAMUSCULAR | Status: AC | PRN
  Administered 2014-10-11: 10 mL via INTRATHECAL

## 2014-10-11 MED ORDER — DIAZEPAM 5 MG PO TABS
5.0000 mg | ORAL_TABLET | Freq: Once | ORAL | Status: AC
  Administered 2014-10-11: 5 mg via ORAL

## 2014-10-11 NOTE — Discharge Instructions (Signed)

## 2014-10-27 DIAGNOSIS — M5412 Radiculopathy, cervical region: Secondary | ICD-10-CM | POA: Diagnosis not present

## 2014-11-07 ENCOUNTER — Telehealth: Payer: Self-pay

## 2014-11-07 MED ORDER — SIMVASTATIN 40 MG PO TABS: 40.0000 mg | ORAL_TABLET | Freq: Every day | ORAL | Status: DC

## 2014-11-07 NOTE — Telephone Encounter (Signed)
Pt states he would like to come by and pick up a prescription for his ZOCOR 40 MG. Please call [phone] when ready

## 2014-11-07 NOTE — Telephone Encounter (Signed)
Refill done. Recheck of his cholesterol in 03/2015.

## 2014-11-07 NOTE — Telephone Encounter (Signed)
Can someone print out and sign and leave in nurse's box.

## 2014-11-07 NOTE — Telephone Encounter (Signed)
Pt.notified

## 2014-11-09 ENCOUNTER — Ambulatory Visit (INDEPENDENT_AMBULATORY_CARE_PROVIDER_SITE_OTHER): Payer: Medicare Other | Admitting: Family Medicine

## 2014-11-09 VITALS — BP 130/60 | HR 68 | Temp 97.9°F | Resp 16 | Ht 72.0 in | Wt 203.0 lb

## 2014-11-09 DIAGNOSIS — E785 Hyperlipidemia, unspecified: Secondary | ICD-10-CM

## 2014-11-09 DIAGNOSIS — D6489 Other specified anemias: Secondary | ICD-10-CM | POA: Diagnosis not present

## 2014-11-09 DIAGNOSIS — Z86010 Personal history of colonic polyps: Secondary | ICD-10-CM

## 2014-11-09 DIAGNOSIS — E119 Type 2 diabetes mellitus without complications: Secondary | ICD-10-CM | POA: Diagnosis not present

## 2014-11-09 DIAGNOSIS — J0101 Acute recurrent maxillary sinusitis: Secondary | ICD-10-CM | POA: Diagnosis not present

## 2014-11-09 DIAGNOSIS — J329 Chronic sinusitis, unspecified: Secondary | ICD-10-CM

## 2014-11-09 LAB — LIPID PANEL
Cholesterol: 107 mg/dL — ABNORMAL LOW (ref 125–200)
HDL: 43 mg/dL (ref >=40)
LDL CALC: 45 mg/dL (ref <130)
TRIGLYCERIDES: 94 mg/dL (ref <150)
Total CHOL/HDL Ratio: 2.5 Ratio (ref <=5.0)
VLDL: 19 mg/dL (ref <30)

## 2014-11-09 LAB — BASIC METABOLIC PANEL
BUN: 9 mg/dL (ref 7–25)
CHLORIDE: 106 mmol/L (ref 98–110)
CO2: 29 mmol/L (ref 20–31)
Calcium: 9.4 mg/dL (ref 8.6–10.3)
Creat: 1.03 mg/dL (ref 0.70–1.18)
Glucose, Bld: 130 mg/dL — ABNORMAL HIGH (ref 65–99)
POTASSIUM: 4.7 mmol/L (ref 3.5–5.3)
Sodium: 143 mmol/L (ref 135–146)

## 2014-11-09 LAB — CBC
HCT: 38.8 % — ABNORMAL LOW (ref 39.0–52.0)
HEMOGLOBIN: 13 g/dL (ref 13.0–17.0)
MCH: 31.6 pg (ref 26.0–34.0)
MCHC: 33.5 g/dL (ref 30.0–36.0)
MCV: 94.2 fL (ref 78.0–100.0)
MPV: 11.2 fL (ref 8.6–12.4)
Platelets: 186 10*3/uL (ref 150–400)
RBC: 4.12 MIL/uL — AB (ref 4.22–5.81)
RDW: 13.2 % (ref 11.5–15.5)
WBC: 4.9 10*3/uL (ref 4.0–10.5)

## 2014-11-09 MED ORDER — AMOXICILLIN 875 MG PO TABS: 875.0000 mg | ORAL_TABLET | Freq: Two times a day (BID) | ORAL | Status: DC

## 2014-11-09 NOTE — Patient Instructions (Addendum)
Use afrin nasal spray twice a in each nostril for 3 days Continue flonase nasal spray, two sprays in each nostril daily Use saline nasal spray several times throughout the day Antibiotic has been sent to your pharmacy   Sinusitis Sinusitis is redness, soreness, and inflammation of the paranasal sinuses. Paranasal sinuses are air pockets within the bones of your face (beneath the eyes, the middle of the forehead, or above the eyes). In healthy paranasal sinuses, mucus is able to drain out, and air is able to circulate through them by way of your nose. However, when your paranasal sinuses are inflamed, mucus and air can become trapped. This can allow bacteria and other germs to grow and cause infection. Sinusitis can develop quickly and last only a short time (acute) or continue over a long period (chronic). Sinusitis that lasts for more than 12 weeks is considered chronic.  CAUSES  Causes of sinusitis include:  Allergies.  Structural abnormalities, such as displacement of the cartilage that separates your nostrils (deviated septum), which can decrease the air flow through your nose and sinuses and affect sinus drainage.  Functional abnormalities, such as when the small hairs (cilia) that line your sinuses and help remove mucus do not work properly or are not present. SIGNS AND SYMPTOMS  Symptoms of acute and chronic sinusitis are the same. The primary symptoms are pain and pressure around the affected sinuses. Other symptoms include:  Upper toothache.  Earache.  Headache.  Bad breath.  Decreased sense of smell and taste.  A cough, which worsens when you are lying flat.  Fatigue.  Fever.  Thick drainage from your nose, which often is green and may contain pus (purulent).  Swelling and warmth over the affected sinuses. DIAGNOSIS  Your health care provider will perform a physical exam. During the exam, your health care provider may:  Look in your nose for signs of abnormal  growths in your nostrils (nasal polyps).  Tap over the affected sinus to check for signs of infection.  View the inside of your sinuses (endoscopy) using an imaging device that has a light attached (endoscope). If your health care provider suspects that you have chronic sinusitis, one or more of the following tests may be recommended:  Allergy tests.  Nasal culture. A sample of mucus is taken from your nose, sent to a lab, and screened for bacteria.  Nasal cytology. A sample of mucus is taken from your nose and examined by your health care provider to determine if your sinusitis is related to an allergy. TREATMENT  Most cases of acute sinusitis are related to a viral infection and will resolve on their own within 10 days. Sometimes medicines are prescribed to help relieve symptoms (pain medicine, decongestants, nasal steroid sprays, or saline sprays).  However, for sinusitis related to a bacterial infection, your health care provider will prescribe antibiotic medicines. These are medicines that will help kill the bacteria causing the infection.  Rarely, sinusitis is caused by a fungal infection. In theses cases, your health care provider will prescribe antifungal medicine. For some cases of chronic sinusitis, surgery is needed. Generally, these are cases in which sinusitis recurs more than 3 times per year, despite other treatments. HOME CARE INSTRUCTIONS   Drink plenty of water. Water helps thin the mucus so your sinuses can drain more easily.  Use a humidifier.  Inhale steam 3 to 4 times a day (for example, sit in the bathroom with the shower running).  Apply a warm, moist washcloth to  your face 3 to 4 times a day, or as directed by your health care provider.  Use saline nasal sprays to help moisten and clean your sinuses.  Take medicines only as directed by your health care provider.  If you were prescribed either an antibiotic or antifungal medicine, finish it all even if you start  to feel better. SEEK IMMEDIATE MEDICAL CARE IF:  You have increasing pain or severe headaches.  You have nausea, vomiting, or drowsiness.  You have swelling around your face.  You have vision problems.  You have a stiff neck.  You have difficulty breathing. MAKE SURE YOU:   Understand these instructions.  Will watch your condition.  Will get help right away if you are not doing well or get worse. Document Released: 03/17/2005 Document Revised: 08/01/2013 Document Reviewed: 04/01/2011 [facility] Patient Information 2015 [place], [place]. This information is not intended to replace advice given to you by your health care provider. Make sure you discuss any questions you have with your health care provider.

## 2014-11-09 NOTE — Progress Notes (Signed)
Subjective:    Patient ID: Douglas [name], male    DOB: 1935-08-05, 79 y.o.   MRN: [id number]  HPI The patient presents today with 7 day history of nasal congestion, frontal headache, ear pressure L>R, no post nasal drainage, no sore throat, occasional cough (non productive). He is a nonsmoker. This is a recurrent problem for him and he insists that antibiotics are the only treatment that works. He is on flonase and states that he uses it daily. Uses saline occasionally as well.   Continues to have GERD symptoms and has noticed some occasional blood in stool and some dark stools. He has seen Dr. Fuller Plan in the past and would like a referral to go back. Last CBC 12/15 showed mild anemia. He had colonoscopy 2013 with several polyps removed.   Past Medical History  Diagnosis Date  . Cancer   . Hyperlipidemia   . Cataract   . Low testosterone    Past Surgical History  Procedure Laterality Date  . Spine surgery    . Appendectomy    . Eye surgery    . Prostate surgery     Family History  Problem Relation Age of Onset  . Cancer Father 52    pancreatic  . Colon cancer Neg Hx    Social History  Substance Use Topics  . Smoking status: Never Smoker   . Smokeless tobacco: None  . Alcohol Use: No     Comment: once a year   Review of Systems No fever or chills, no SOB. No edema. No chest pain.      Objective:   Physical Exam  Constitutional: He is oriented to person, place, and time. He appears well-developed and well-nourished.  Appears younger than stated age.  [name]:  Head: Normocephalic and atraumatic.  Right Ear: Tympanic membrane, external ear and ear canal normal.  Left Ear: Tympanic membrane, external ear and ear canal normal.  Nose: Mucosal edema present. No rhinorrhea. Right sinus exhibits maxillary sinus tenderness and frontal sinus tenderness. Left sinus exhibits maxillary sinus tenderness and frontal sinus tenderness.  Mouth/Throat: Oropharynx is clear and moist and  mucous membranes are normal.  Bifurcated uvula.  Cardiovascular: Normal rate, regular rhythm and normal heart sounds.   Pulmonary/Chest: Effort normal and breath sounds normal.  Musculoskeletal: Normal range of motion.  Neurological: He is alert and oriented to person, place, and time.  Skin: Skin is warm and dry.  Psychiatric: He has a normal mood and affect. His behavior is normal. Judgment and thought content normal.  Vitals reviewed.  BP 130/60 mmHg  Pulse 68  Temp(Src) 97.9 F (36.6 C) (Oral)  Resp 16  Ht 6' (1.829 m)  Wt 203 lb (92.08 kg)  BMI 27.53 kg/m2  SpO2 96%     Assessment & Plan:  1. Acute recurrent maxillary sinusitis - amoxicillin (AMOXIL) 875 MG tablet; Take 1 tablet (875 mg total) by mouth 2 (two) times daily.  Dispense: 14 tablet; Refill: 0 - Provided written and verbal information regarding diagnosis and treatment.- suggested continued flonase, saline nasal spray, afrin BID x 3 days - Ambulatory referral to ENT - RTC if no improvement in 5-7 days  2. Hyperlipidemia - Basic metabolic panel - Lipid panel - Hemoglobin A1c  3. Anemia due to other cause - CBC - Ambulatory referral to Gastroenterology  4. Type 2 diabetes mellitus without complication - Basic metabolic panel - Lipid panel - Hemoglobin A1c  5. Chronic sinusitis, unspecified location - amoxicillin (AMOXIL) 875 MG  tablet; Take 1 tablet (875 mg total) by mouth 2 (two) times daily.  Dispense: 14 tablet; Refill: 0 - Ambulatory referral to ENT  6. History of colonic polyps - Ambulatory referral to Gastroenterology  - follow up as scheduled for CPE with Dr. Carlota Raspberry 1/17  Clarene Reamer, FNP-BC  [facility]  11/09/2014 9:41 AM

## 2014-11-10 LAB — HEMOGLOBIN A1C
Hgb A1c MFr Bld: 7.1 % — ABNORMAL HIGH (ref <5.7)
Mean Plasma Glucose: 157 mg/dL — ABNORMAL HIGH (ref <117)

## 2014-11-14 DIAGNOSIS — M5020 Other cervical disc displacement, unspecified cervical region: Secondary | ICD-10-CM | POA: Diagnosis not present

## 2014-11-14 DIAGNOSIS — G5602 Carpal tunnel syndrome, left upper limb: Secondary | ICD-10-CM | POA: Diagnosis not present

## 2014-11-14 DIAGNOSIS — G5601 Carpal tunnel syndrome, right upper limb: Secondary | ICD-10-CM | POA: Diagnosis not present

## 2014-11-15 ENCOUNTER — Other Ambulatory Visit: Payer: Self-pay | Admitting: Physician Assistant

## 2014-11-15 DIAGNOSIS — M25512 Pain in left shoulder: Secondary | ICD-10-CM | POA: Diagnosis not present

## 2014-11-15 MED ORDER — FLUTICASONE PROPIONATE 50 MCG/ACT NA SUSP: NASAL | Status: DC

## 2014-11-19 ENCOUNTER — Encounter: Payer: Self-pay | Admitting: Family Medicine

## 2014-11-22 DIAGNOSIS — M25512 Pain in left shoulder: Secondary | ICD-10-CM | POA: Diagnosis not present

## 2014-11-30 DIAGNOSIS — M25512 Pain in left shoulder: Secondary | ICD-10-CM | POA: Diagnosis not present

## 2014-12-06 ENCOUNTER — Ambulatory Visit (INDEPENDENT_AMBULATORY_CARE_PROVIDER_SITE_OTHER): Payer: Medicare Other

## 2014-12-06 DIAGNOSIS — Z23 Encounter for immunization: Secondary | ICD-10-CM

## 2014-12-18 DIAGNOSIS — J324 Chronic pansinusitis: Secondary | ICD-10-CM | POA: Diagnosis not present

## 2014-12-19 ENCOUNTER — Encounter: Payer: Self-pay | Admitting: Gastroenterology

## 2014-12-19 ENCOUNTER — Ambulatory Visit (INDEPENDENT_AMBULATORY_CARE_PROVIDER_SITE_OTHER): Payer: Medicare Other | Admitting: Gastroenterology

## 2014-12-19 VITALS — BP 130/60 | HR 80 | Ht 71.0 in | Wt 198.0 lb

## 2014-12-19 DIAGNOSIS — D649 Anemia, unspecified: Secondary | ICD-10-CM

## 2014-12-19 DIAGNOSIS — R195 Other fecal abnormalities: Secondary | ICD-10-CM | POA: Diagnosis not present

## 2014-12-19 DIAGNOSIS — K219 Gastro-esophageal reflux disease without esophagitis: Secondary | ICD-10-CM | POA: Diagnosis not present

## 2014-12-19 DIAGNOSIS — Z86010 Personal history of colonic polyps: Secondary | ICD-10-CM | POA: Diagnosis not present

## 2014-12-19 MED ORDER — NA SULFATE-K SULFATE-MG SULF 17.5-3.13-1.6 GM/177ML PO SOLN: ORAL | Status: DC

## 2014-12-19 NOTE — Progress Notes (Signed)
    History of Present Illness: This is a 79 year old male with a mild anemia noted over the past year. Most recent hemoglobin was at low end of normal at 13 and hematocrit slightly below the normal range at 38.8. Stool testing for occult blood was positive several months ago. He has intermittent reflux symptoms about once every 3-4 weeks and he wakes up at night with burning and regurgitation. He takes Prilosec as needed. Prior history of adenomatous colon polyps on colonoscopy in March 2014. Denies weight loss, abdominal pain, constipation, diarrhea, change in stool caliber, melena, hematochezia, nausea, vomiting, dysphagia, chest pain.  Current Medications, Allergies, Past Medical History, Past Surgical History, Family History and Social History were reviewed in [employer] record.  Physical Exam: General: Well developed , well nourished, no acute distress Head: Normocephalic and atraumatic Eyes:  sclerae anicteric, EOMI Ears: Normal auditory acuity Mouth: No deformity or lesions Lungs: Clear throughout to auscultation Heart: Regular rate and rhythm; no murmurs, rubs or bruits Abdomen: Soft, non tender and non distended. No masses, hepatosplenomegaly or hernias noted. Normal Bowel sounds Musculoskeletal: Symmetrical with no gross deformities  Pulses:  Normal pulses noted Extremities: No clubbing, cyanosis, edema or deformities noted Neurological: Alert oriented x 4, grossly nonfocal Psychological:  Alert and cooperative. Normal mood and affect  Assessment and Recommendations:  1. GERD, mild normocytic anemia, heme + stool, personal history of adenomatous polyps. Follow all standard antireflux measures and take omeprazole daily instead of as needed. Schedule colonoscopy and EGD for further evaluation. The risks (including bleeding, perforation, infection, missed lesions, medication reactions and possible hospitalization or surgery if complications occur), benefits, and  alternatives to colonoscopy with possible biopsy and possible polypectomy were discussed with the patient and they consent to proceed. The risks (including bleeding, perforation, infection, missed lesions, medication reactions and possible hospitalization or surgery if complications occur), benefits, and alternatives to endoscopy with possible biopsy and possible dilation were discussed with the patient and they consent to proceed.

## 2014-12-19 NOTE — Patient Instructions (Signed)
You have been scheduled for an endoscopy and colonoscopy. Please follow the written instructions given to you at your visit today. Please pick up your prep supplies at the pharmacy within the next 1-3 days. If you use inhalers (even only as needed), please bring them with you on the day of your procedure.   Patient advised to avoid spicy, acidic, citrus, chocolate, mints, fruit and fruit juices.  Limit the intake of caffeine, alcohol and Soda.  Don't exercise too soon after eating.  Don't lie down within 3-4 hours of eating.  Elevate the head of your bed.  Thank you for choosing me and [facility].  Malcolm T. Stark, Jr., MD., FACG  

## 2014-12-22 DIAGNOSIS — G5601 Carpal tunnel syndrome, right upper limb: Secondary | ICD-10-CM | POA: Diagnosis not present

## 2014-12-22 DIAGNOSIS — Z6827 Body mass index (BMI) 27.0-27.9, adult: Secondary | ICD-10-CM | POA: Diagnosis not present

## 2014-12-22 DIAGNOSIS — M5412 Radiculopathy, cervical region: Secondary | ICD-10-CM | POA: Diagnosis not present

## 2015-01-12 ENCOUNTER — Ambulatory Visit (INDEPENDENT_AMBULATORY_CARE_PROVIDER_SITE_OTHER): Payer: Medicare Other

## 2015-01-12 ENCOUNTER — Ambulatory Visit (INDEPENDENT_AMBULATORY_CARE_PROVIDER_SITE_OTHER): Payer: Medicare Other | Admitting: Emergency Medicine

## 2015-01-12 DIAGNOSIS — J189 Pneumonia, unspecified organism: Secondary | ICD-10-CM | POA: Diagnosis not present

## 2015-01-12 DIAGNOSIS — R059 Cough, unspecified: Secondary | ICD-10-CM

## 2015-01-12 DIAGNOSIS — J029 Acute pharyngitis, unspecified: Secondary | ICD-10-CM | POA: Diagnosis not present

## 2015-01-12 DIAGNOSIS — R05 Cough: Secondary | ICD-10-CM | POA: Diagnosis not present

## 2015-01-12 LAB — POCT CBC
Granulocyte percent: 84.9 %G — AB (ref 37–80)
HCT, POC: 39.4 % — AB (ref 43.5–53.7)
HEMOGLOBIN: 13.6 g/dL — AB (ref 14.1–18.1)
Lymph, poc: 1.7 (ref 0.6–3.4)
MCH: 32.5 pg — AB (ref 27–31.2)
MCHC: 34.6 g/dL (ref 31.8–35.4)
MCV: 93.8 fL (ref 80–97)
MID (cbc): 0.7 (ref 0–0.9)
MPV: 7.7 fL (ref 0–99.8)
PLATELET COUNT, POC: 130 10*3/uL — AB (ref 142–424)
POC Granulocyte: 13.8 — AB (ref 2–6.9)
POC LYMPH PERCENT: 10.5 %L (ref 10–50)
POC MID %: 4.6 % (ref 0–12)
RBC: 4.2 M/uL — AB (ref 4.69–6.13)
RDW, POC: 12.9 %
WBC: 16.2 10*3/uL — AB (ref 4.6–10.2)

## 2015-01-12 MED ORDER — BENZONATATE 100 MG PO CAPS: 100.0000 mg | ORAL_CAPSULE | Freq: Three times a day (TID) | ORAL | Status: DC | PRN

## 2015-01-12 MED ORDER — CEFTRIAXONE SODIUM 1 G IJ SOLR
1.0000 g | Freq: Once | INTRAMUSCULAR | Status: AC
  Administered 2015-01-12: 1 g via INTRAMUSCULAR

## 2015-01-12 MED ORDER — AZITHROMYCIN 250 MG PO TABS: ORAL_TABLET | ORAL | Status: DC

## 2015-01-12 NOTE — Progress Notes (Addendum)
This chart was scribed for Douglas Queen, MD by Thea Alken, ED Scribe. This patient was seen in room 11 and the patient's care was started at 8:28 AM.  Chief Complaint:  Chief Complaint  Patient presents with  . Cough    x 5 days   . Sore Throat  . Nausea    HPI: Douglas [name] is a 79 y.o. male who reports to [facility] today complaining of a cough for 5 days. Pt reports he woke up 5 days ago with a scratchy throat that worsened with sore throat, coughing, chest congestion and chest tightness. States cough was initially productive but has become dry. He has been taking Claritin D with minimal relief. He has hx of allergies that worsen with seasonal changes. He denies fever, chills, wheezing and SOB.  Past Medical History  Diagnosis Date  . Cancer ([place])   . Hyperlipidemia   . Cataract   . Low testosterone   . Adenomatous colon polyp 03/2001   Past Surgical History  Procedure Laterality Date  . Spine surgery    . Appendectomy    . Eye surgery    . Prostate surgery     Social History   Social History  . Marital Status: Married    Spouse Name: N/A  . Number of Children: 3  . Years of Education: 2 years [employer]   Occupational History  . RETIRED    Social History Main Topics  . Smoking status: Never Smoker   . Smokeless tobacco: Never Used  . Alcohol Use: No     Comment: once a year  . Drug Use: No  . Sexual Activity: Yes   Other Topics Concern  . None   Social History Narrative   Married. Education: [employer]. Exercise: Yes.   Family History  Problem Relation Age of Onset  . Cancer Father 75    pancreatic  . Colon cancer Neg Hx    No Known Allergies Prior to Admission medications   Medication Sig Start Date End Date Taking? Authorizing Provider  alfuzosin (UROXATRAL) 10 MG 24 hr tablet  11/09/14  Yes Historical Provider, MD  aspirin 81 MG tablet Take 81 mg by mouth daily.   Yes Historical Provider, MD  Cholecalciferol (VITAMIN D) 2000 UNITS tablet Take 2,000 Units  by mouth daily.   Yes Historical Provider, MD  fluticasone (FLONASE) 50 MCG/ACT nasal spray PLACE 2 SPRAYS INTO THE NOSE DAILY. 11/15/14  Yes Elby Beck, FNP  omeprazole (PRILOSEC) 20 MG capsule TAKE ONE CAPSULE BY MOUTH EVERY DAY 07/10/14  Yes Chelle Jeffery, PA-C  simvastatin (ZOCOR) 40 MG tablet TAKE 1 TABLET BY MOUTH EVERY DAY AT 6 PM 11/15/14  Yes Elby Beck, FNP  Testosterone 30 MG/ACT SOLN Place 30 g onto the skin.   Yes Historical Provider, MD  vitamin C (ASCORBIC ACID) 500 MG tablet Take 500 mg by mouth daily.   Yes Historical Provider, MD  Na Sulfate-K Sulfate-Mg Sulf SOLN SUPREP use as directed Patient not taking: Reported on 01/12/2015 12/19/14   [name], MD  simvastatin (ZOCOR) 40 MG tablet Take 1 tablet (40 mg total) by mouth daily at 6 PM. Patient not taking: Reported on 01/12/2015 11/07/14   Jaynee Eagles, PA-C     ROS: The patient denies fevers, chills, night sweats, unintentional weight loss, chest pain, palpitations, wheezing, dyspnea on exertion, nausea, vomiting, abdominal pain, dysuria, hematuria, melena, numbness, weakness, or tingling.   All other systems have been reviewed and were  otherwise negative with the exception of those mentioned in the HPI and as above.    PHYSICAL EXAM: Filed Vitals:   01/12/15 0816  BP: 126/64  Pulse: 98  Temp: 98.5 F (36.9 C)  Resp: 20   Body mass index is 27.39 kg/(m^2).   General: Alert, no acute distress HEENT:  Normocephalic, atraumatic, oropharynx patent. Eye: [facility] Cardiovascular:  Regular rate and rhythm, no rubs murmurs or gallops.  No Carotid bruits, radial pulse intact. No pedal edema.  Respiratory: Clear to auscultation bilaterally. With deep inspiration episode of coughing No wheezes, rales, or rhonchi.  No cyanosis, no use of accessory musculature.  Abdominal: No organomegaly, abdomen is soft and non-tender, positive bowel sounds.  No masses. Musculoskeletal: Gait intact. No edema,  tenderness Skin: No rashes. Neurologic: Facial musculature symmetric. Psychiatric: Patient acts appropriately throughout our interaction. Lymphatic: No cervical or submandibular lymphadenopathy    LABS: Results for orders placed or performed in visit on 01/12/15  POCT CBC  Result Value Ref Range   WBC 16.2 (A) 4.6 - 10.2 K/uL   Lymph, poc 1.7 0.6 - 3.4   POC LYMPH PERCENT 10.5 10 - 50 %L   MID (cbc) 0.7 0 - 0.9   POC MID % 4.6 0 - 12 %M   POC Granulocyte 13.8 (A) 2 - 6.9   Granulocyte percent 84.9 (A) 37 - 80 %G   RBC 4.20 (A) 4.69 - 6.13 M/uL   Hemoglobin 13.6 (A) 14.1 - 18.1 g/dL   HCT, POC 39.4 (A) 43.5 - 53.7 %   MCV 93.8 80 - 97 fL   MCH, POC 32.5 (A) 27 - 31.2 pg   MCHC 34.6 31.8 - 35.4 g/dL   RDW, POC 12.9 %   Platelet Count, POC 130 (A) 142 - 424 K/uL   MPV 7.7 0 - 99.8 fL   Meds ordered this encounter  Medications  . benzonatate (TESSALON) 100 MG capsule    Sig: Take 1-2 capsules (100-200 mg total) by mouth 3 (three) times daily as needed for cough.    Dispense:  40 capsule    Refill:  0  . azithromycin (ZITHROMAX) 250 MG tablet    Sig: Take 2 tabs PO x 1 dose, then 1 tab PO QD x 4 days    Dispense:  6 tablet    Refill:  0  . cefTRIAXone (ROCEPHIN) injection 1 g    Sig:    EKG/XRAY:   Primary read interpreted by Dr. Everlene Farrier at [facility]. CXR-NAD   ASSESSMENT/PLAN: White count 16,000. Very suspicious for a pneumonia. I do not see an pneumonia on his chest x-ray. Will treat with Levaquin and Tessalon Perles recheck on Sunday unless he is feeling significantly better.I personally performed the services described in this documentation, which was scribed in my presence. The recorded information has been reviewed and is accurate   Gross sideeffects, risk and benefits, and alternatives of medications d/w patient. Patient is aware that all medications have potential sideeffects and we are unable to predict every sideeffect or drug-drug interaction that may occur.  By  signing my name below, I, Raven Small, attest that this documentation has been prepared under the direction and in the presence of Douglas Queen, MD.  Electronically Signed: Thea Alken, ED Scribe. 01/12/2015. 8:35 AM.  Douglas Queen MD 01/12/2015 8:23 AM   '

## 2015-01-12 NOTE — Patient Instructions (Addendum)
Please return to clinic Sunday for recheck unless you are feeling significantly better.       Community-Acquired Pneumonia, Adult Pneumonia is an infection of the lungs. There are different types of pneumonia. One type can develop while a person is in a hospital. A different type, called community-acquired pneumonia, develops in people who are not, or have not recently been, in the hospital or other health care facility.  CAUSES Pneumonia may be caused by bacteria, viruses, or funguses. Community-acquired pneumonia is often caused by Streptococcus pneumonia bacteria. These bacteria are often passed from one person to another by breathing in droplets from the cough or sneeze of an infected person. RISK FACTORS The condition is more likely to develop in:  People who havechronic diseases, such as chronic obstructive pulmonary disease (COPD), asthma, congestive heart failure, cystic fibrosis, diabetes, or kidney disease.  People who haveearly-stage or late-stage HIV.  People who havesickle cell disease.  People who havehad their spleen removed (splenectomy).  People who havepoor Human resources officer.  People who havemedical conditions that increase the risk of breathing in (aspirating) secretions their own mouth and nose.   People who havea weakened immune system (immunocompromised).  People who smoke.  People whotravel to areas where pneumonia-causing germs commonly exist.  People whoare around animal habitats or animals that have pneumonia-causing germs, including birds, bats, rabbits, cats, and farm animals. SYMPTOMS Symptoms of this condition include:  Adry cough.  A wet (productive) cough.  Fever.  Sweating.  Chest pain, especially when breathing deeply or coughing.  Rapid breathing or difficulty breathing.  Shortness of breath.  Shaking chills.  Fatigue.  Muscle aches. DIAGNOSIS Your health care provider will take a medical history and perform a physical  exam. You may also have other tests, including:  Imaging studies of your chest, including X-rays.  Tests to check your blood oxygen level and other blood gases.  Other tests on blood, mucus (sputum), fluid around your lungs (pleural fluid), and urine. If your pneumonia is severe, other tests may be done to identify the specific cause of your illness. TREATMENT The type of treatment that you receive depends on many factors, such as the cause of your pneumonia, the medicines you take, and other medical conditions that you have. For most adults, treatment and recovery from pneumonia may occur at home. In some cases, treatment must happen in a hospital. Treatment may include:  Antibiotic medicines, if the pneumonia was caused by bacteria.  Antiviral medicines, if the pneumonia was caused by a virus.  Medicines that are given by mouth or through an IV tube.  Oxygen.  Respiratory therapy. Although rare, treating severe pneumonia may include:  Mechanical ventilation. This is done if you are not breathing well on your own and you cannot maintain a safe blood oxygen level.  Thoracentesis. This procedureremoves fluid around one lung or both lungs to help you breathe better. HOME CARE INSTRUCTIONS  Take over-the-counter and prescription medicines only as told by your health care provider.  Only takecough medicine if you are losing sleep. Understand that cough medicine can prevent your body's natural ability to remove mucus from your lungs.  If you were prescribed an antibiotic medicine, take it as told by your health care provider. Do not stop taking the antibiotic even if you start to feel better.  Sleep in a semi-upright position at night. Try sleeping in a reclining chair, or place a few pillows under your head.  Do not use tobacco products, including cigarettes, chewing tobacco, and  e-cigarettes. If you need help quitting, ask your health care provider.  Drink enough water to keep  your urine clear or pale yellow. This will help to thin out mucus secretions in your lungs. PREVENTION There are ways that you can decrease your risk of developing community-acquired pneumonia. Consider getting a pneumococcal vaccine if:  You are older than 77 years of age.  You are older than 77 years of age and are undergoing cancer treatment, have chronic lung disease, or have other medical conditions that affect your immune system. Ask your health care provider if this applies to you. There are different types and schedules of pneumococcal vaccines. Ask your health care provider which vaccination option is best for you. You may also prevent community-acquired pneumonia if you take these actions:  Get an influenza vaccine every year. Ask your health care provider which type of influenza vaccine is best for you.  Go to the dentist on a regular basis.  Wash your hands often. Use hand sanitizer if soap and water are not available. SEEK MEDICAL CARE IF:  You have a fever.  You are losing sleep because you cannot control your cough with cough medicine. SEEK IMMEDIATE MEDICAL CARE IF:  You have worsening shortness of breath.  You have increased chest pain.  Your sickness becomes worse, especially if you are an older adult or have a weakened immune system.  You cough up blood.   This information is not intended to replace advice given to you by your health care provider. Make sure you discuss any questions you have with your health care provider.   Document Released: 03/17/2005 Document Revised: 12/06/2014 Document Reviewed: 07/12/2014 Elsevier Interactive Patient Education [employer].

## 2015-01-17 ENCOUNTER — Encounter: Payer: Self-pay | Admitting: Emergency Medicine

## 2015-01-17 ENCOUNTER — Ambulatory Visit (INDEPENDENT_AMBULATORY_CARE_PROVIDER_SITE_OTHER): Payer: Medicare Other

## 2015-01-17 ENCOUNTER — Ambulatory Visit (INDEPENDENT_AMBULATORY_CARE_PROVIDER_SITE_OTHER): Payer: Medicare Other | Admitting: Emergency Medicine

## 2015-01-17 VITALS — BP 124/80 | HR 71 | Temp 97.8°F | Resp 18 | Ht 72.0 in | Wt 201.0 lb

## 2015-01-17 DIAGNOSIS — D72829 Elevated white blood cell count, unspecified: Secondary | ICD-10-CM | POA: Diagnosis not present

## 2015-01-17 DIAGNOSIS — R059 Cough, unspecified: Secondary | ICD-10-CM

## 2015-01-17 DIAGNOSIS — R062 Wheezing: Secondary | ICD-10-CM

## 2015-01-17 DIAGNOSIS — R05 Cough: Secondary | ICD-10-CM

## 2015-01-17 DIAGNOSIS — J189 Pneumonia, unspecified organism: Secondary | ICD-10-CM | POA: Diagnosis not present

## 2015-01-17 LAB — POCT CBC
Granulocyte percent: 46 %G (ref 37–80)
HCT, POC: 41.5 % — AB (ref 43.5–53.7)
HEMOGLOBIN: 14 g/dL — AB (ref 14.1–18.1)
LYMPH, POC: 3.9 — AB (ref 0.6–3.4)
MCH: 31.7 pg — AB (ref 27–31.2)
MCHC: 33.8 g/dL (ref 31.8–35.4)
MCV: 93.7 fL (ref 80–97)
MID (cbc): 0.4 (ref 0–0.9)
MPV: 7.6 fL (ref 0–99.8)
PLATELET COUNT, POC: 143 10*3/uL (ref 142–424)
POC Granulocyte: 3.7 (ref 2–6.9)
POC LYMPH PERCENT: 48.9 %L (ref 10–50)
POC MID %: 5.1 % (ref 0–12)
RBC: 4.43 M/uL — AB (ref 4.69–6.13)
RDW, POC: 13.2 %
WBC: 8 10*3/uL (ref 4.6–10.2)

## 2015-01-17 MED ORDER — IPRATROPIUM BROMIDE 0.02 % IN SOLN
0.5000 mg | Freq: Once | RESPIRATORY_TRACT | Status: AC
  Administered 2015-01-17: 0.5 mg via RESPIRATORY_TRACT

## 2015-01-17 MED ORDER — ALBUTEROL SULFATE (2.5 MG/3ML) 0.083% IN NEBU
2.5000 mg | INHALATION_SOLUTION | Freq: Once | RESPIRATORY_TRACT | Status: AC
  Administered 2015-01-17: 2.5 mg via RESPIRATORY_TRACT

## 2015-01-17 MED ORDER — ALBUTEROL SULFATE HFA 108 (90 BASE) MCG/ACT IN AERS: 2.0000 | INHALATION_SPRAY | RESPIRATORY_TRACT | Status: DC | PRN

## 2015-01-17 MED ORDER — PREDNISONE 10 MG PO TABS: ORAL_TABLET | ORAL | Status: DC

## 2015-01-17 NOTE — Progress Notes (Signed)
This chart was scribed for Arlyss Queen, MD by [name], Medical Scribe. This patient was seen in Room 4 and the patient's care was started 10:28 AM.  Chief Complaint:  Chief Complaint  Patient presents with  . Follow-up    still has a deep cough, nasal congestion from Friday.     HPI: Douglas [name] is a 79 y.o. male who reports to [facility] today for follow up.  He still has a deep productive cough (yellow mucus) and nasal congestion from 5 days ago.  He has taken all of his antibiotics. He denies sick contact at home. He denies h/o asthma. He denies glaucoma.   On Oct 14, chest xray was nl  CBC, elevated white count   Past Medical History  Diagnosis Date  . Cancer ([place])   . Hyperlipidemia   . Cataract   . Low testosterone   . Adenomatous colon polyp 03/2001   Past Surgical History  Procedure Laterality Date  . Spine surgery    . Appendectomy    . Eye surgery    . Prostate surgery     Social History   Social History  . Marital Status: Married    Spouse Name: N/A  . Number of Children: 3  . Years of Education: 2 years [employer]   Occupational History  . RETIRED    Social History Main Topics  . Smoking status: Never Smoker   . Smokeless tobacco: Never Used  . Alcohol Use: No     Comment: once a year  . Drug Use: No  . Sexual Activity: Yes   Other Topics Concern  . None   Social History Narrative   Married. Education: [employer]. Exercise: Yes.   Family History  Problem Relation Age of Onset  . Cancer Father 63    pancreatic  . Colon cancer Neg Hx    No Known Allergies Prior to Admission medications   Medication Sig Start Date End Date Taking? Authorizing Provider  alfuzosin (UROXATRAL) 10 MG 24 hr tablet  11/09/14   Historical Provider, MD  aspirin 81 MG tablet Take 81 mg by mouth daily.    Historical Provider, MD  azithromycin (ZITHROMAX) 250 MG tablet Take 2 tabs PO x 1 dose, then 1 tab PO QD x 4 days 01/12/15   Darlyne Russian, MD  benzonatate  (TESSALON) 100 MG capsule Take 1-2 capsules (100-200 mg total) by mouth 3 (three) times daily as needed for cough. 01/12/15   Darlyne Russian, MD  Cholecalciferol (VITAMIN D) 2000 UNITS tablet Take 2,000 Units by mouth daily.    Historical Provider, MD  fluticasone (FLONASE) 50 MCG/ACT nasal spray PLACE 2 SPRAYS INTO THE NOSE DAILY. 11/15/14   Elby Beck, FNP  Na Sulfate-K Sulfate-Mg Sulf SOLN SUPREP use as directed Patient not taking: Reported on 01/12/2015 12/19/14   [name], MD  omeprazole (PRILOSEC) 20 MG capsule TAKE ONE CAPSULE BY MOUTH EVERY DAY 07/10/14   Chelle Jeffery, PA-C  simvastatin (ZOCOR) 40 MG tablet Take 1 tablet (40 mg total) by mouth daily at 6 PM. Patient not taking: Reported on 01/12/2015 11/07/14   Jaynee Eagles, PA-C  simvastatin (ZOCOR) 40 MG tablet TAKE 1 TABLET BY MOUTH EVERY DAY AT 6 PM 11/15/14   Elby Beck, FNP  Testosterone 30 MG/ACT SOLN Place 30 g onto the skin.    Historical Provider, MD  vitamin C (ASCORBIC ACID) 500 MG tablet Take 500 mg by mouth daily.  Historical Provider, MD     ROS:  Constitutional: negative for chills, fever, night sweats, weight changes, or fatigue  HEENT: negative for vision changes, hearing loss, rhinorrhea, ST, epistaxis, or sinus pressure; positive for congestion Cardiovascular: negative for chest pain or palpitations Respiratory: negative for hemoptysis, shortness of breath; positive for cough, wheezing Abdominal: negative for abdominal pain, nausea, vomiting, diarrhea, or constipation Dermatological: negative for rash Neurologic: negative for headache, dizziness, or syncope All other systems reviewed and are otherwise negative with the exception to those above and in the HPI.  PHYSICAL EXAM: Filed Vitals:   01/17/15 1007  BP: 124/80  Pulse: 71  Temp: 97.8 F (36.6 C)  Resp: 18   Body mass index is 27.25 kg/(m^2).   General: Alert, no acute distress HEENT:  Normocephalic, atraumatic, oropharynx  patent. Eye: [facility] M[facility] Cardiovascular:  Regular rate and rhythm, no rubs murmurs or gallops.  No Carotid bruits, radial pulse intact. No pedal edema.  Respiratory: No rales, or rhonchi.  No cyanosis, no use of accessory musculature; bilateral wheezes Abdominal: No organomegaly, abdomen is soft and non-tender, positive bowel sounds. No masses. Musculoskeletal: Gait intact. No edema, tenderness Skin: No rashes. Neurologic: Facial musculature symmetric. Psychiatric: Patient acts appropriately throughout our interaction.  Lymphatic: No cervical or submandibular lymphadenopathy Genitourinary/Anorectal: No acute findings   LABS: Results for orders placed or performed in visit on 01/17/15  POCT CBC  Result Value Ref Range   WBC 8.0 4.6 - 10.2 K/uL   Lymph, poc 3.9 (A) 0.6 - 3.4   POC LYMPH PERCENT 48.9 10 - 50 %L   MID (cbc) 0.4 0 - 0.9   POC MID % 5.1 0 - 12 %M   POC Granulocyte 3.7 2 - 6.9   Granulocyte percent 46.0 37 - 80 %G   RBC 4.43 (A) 4.69 - 6.13 M/uL   Hemoglobin 14.0 (A) 14.1 - 18.1 g/dL   HCT, POC 41.5 (A) 43.5 - 53.7 %   MCV 93.7 80 - 97 fL   MCH, POC 31.7 (A) 27 - 31.2 pg   MCHC 33.8 31.8 - 35.4 g/dL   RDW, POC 13.2 %   Platelet Count, POC 143 142 - 424 K/uL   MPV 7.6 0 - 99.8 fL     EKG/XRAY:   Primary read interpreted by Dr. Everlene Farrier at [facility]: chest xray: no definite, consolidated infiltrates, no change from last xray  ASSESSMENT/PLAN: We'll treat with a short taper dose of prednisone along with an albuterol inhaler. White count has returned him normal. No pneumonia seen on x-ray.I personally performed the services described in this documentation, which was scribed in my presence. The recorded information has been reviewed and is accurate.  By signing my name below, I, [name], attest that this documentation has been prepared under the direction and in the presence of Arlyss Queen, MD. Electronically Signed: [name], [place]. 01/17/2015 , 10:28 AM  .    Johney Maine sideeffects, risk and benefits, and alternatives of medications d/w patient. Patient is aware that all medications have potential sideeffects and we are unable to predict every sideeffect or drug-drug interaction that may occur.  Arlyss Queen MD 01/17/2015 10:28 AM

## 2015-01-17 NOTE — Patient Instructions (Signed)
Asthma, Adult Asthma is a recurring condition in which the airways tighten and narrow. Asthma can make it difficult to breathe. It can cause coughing, wheezing, and shortness of breath. Asthma episodes, also called asthma attacks, range from minor to life-threatening. Asthma cannot be cured, but medicines and lifestyle changes can help control it. CAUSES Asthma is believed to be caused by inherited (genetic) and environmental factors, but its exact cause is unknown. Asthma may be triggered by allergens, lung infections, or irritants in the air. Asthma triggers are different for each person. Common triggers include:   Animal dander.  Dust mites.  Cockroaches.  Pollen from trees or grass.  Mold.  Smoke.  Air pollutants such as dust, household cleaners, hair sprays, aerosol sprays, paint fumes, strong chemicals, or strong odors.  Cold air, weather changes, and winds (which increase molds and pollens in the air).  Strong emotional expressions such as crying or laughing hard.  Stress.  Certain medicines (such as aspirin) or types of drugs (such as beta-blockers).  Sulfites in foods and drinks. Foods and drinks that may contain sulfites include dried fruit, potato chips, and sparkling grape juice.  Infections or inflammatory conditions such as the flu, a cold, or an inflammation of the nasal membranes (rhinitis).  Gastroesophageal reflux disease (GERD).  Exercise or strenuous activity. SYMPTOMS Symptoms may occur immediately after asthma is triggered or many hours later. Symptoms include:  Wheezing.  Excessive nighttime or early morning coughing.  Frequent or severe coughing with a common cold.  Chest tightness.  Shortness of breath. DIAGNOSIS  The diagnosis of asthma is made by a review of your medical history and a physical exam. Tests may also be performed. These may include:  Lung function studies. These tests show how much air you breathe in and out.  Allergy  tests.  Imaging tests such as X-rays. TREATMENT  Asthma cannot be cured, but it can usually be controlled. Treatment involves identifying and avoiding your asthma triggers. It also involves medicines. There are 2 classes of medicine used for asthma treatment:   Controller medicines. These prevent asthma symptoms from occurring. They are usually taken every day.  Reliever or rescue medicines. These quickly relieve asthma symptoms. They are used as needed and provide short-term relief. Your health care provider will help you create an asthma action plan. An asthma action plan is a written plan for managing and treating your asthma attacks. It includes a list of your asthma triggers and how they may be avoided. It also includes information on when medicines should be taken and when their dosage should be changed. An action plan may also involve the use of a device called a peak flow meter. A peak flow meter measures how well the lungs are working. It helps you monitor your condition. HOME CARE INSTRUCTIONS   Take medicines only as directed by your health care provider. Speak with your health care provider if you have questions about how or when to take the medicines.  Use a peak flow meter as directed by your health care provider. Record and keep track of readings.  Understand and use the action plan to help minimize or stop an asthma attack without needing to seek medical care.  Control your home environment in the following ways to help prevent asthma attacks:  Do not smoke. Avoid being exposed to secondhand smoke.  Change your heating and air conditioning filter regularly.  Limit your use of fireplaces and wood stoves.  Get rid of pests (such as roaches   and mice) and their droppings.  Throw away plants if you see mold on them.  Clean your floors and dust regularly. Use unscented cleaning products.  Try to have someone else vacuum for you regularly. Stay out of rooms while they are  being vacuumed and for a short while afterward. If you vacuum, use a dust mask from a hardware store, a double-layered or microfilter vacuum cleaner bag, or a vacuum cleaner with a HEPA filter.  Replace carpet with wood, tile, or vinyl flooring. Carpet can trap dander and dust.  Use allergy-proof pillows, mattress covers, and box spring covers.  Wash bed sheets and blankets every week in hot water and dry them in a dryer.  Use blankets that are made of polyester or cotton.  Clean bathrooms and kitchens with bleach. If possible, have someone repaint the walls in these rooms with mold-resistant paint. Keep out of the rooms that are being cleaned and painted.  Wash hands frequently. SEEK MEDICAL CARE IF:   You have wheezing, shortness of breath, or a cough even if taking medicine to prevent attacks.  The colored mucus you cough up (sputum) is thicker than usual.  Your sputum changes from clear or white to yellow, green, gray, or bloody.  You have any problems that may be related to the medicines you are taking (such as a rash, itching, swelling, or trouble breathing).  You are using a reliever medicine more than 2-3 times per week.  Your peak flow is still at 50-79% of your personal best after following your action plan for 1 hour.  You have a fever. SEEK IMMEDIATE MEDICAL CARE IF:   You seem to be getting worse and are unresponsive to treatment during an asthma attack.  You are short of breath even at rest.  You get short of breath when doing very little physical activity.  You have difficulty eating, drinking, or talking due to asthma symptoms.  You develop chest pain.  You develop a fast heartbeat.  You have a bluish color to your lips or fingernails.  You are light-headed, dizzy, or faint.  Your peak flow is less than 50% of your personal best.   This information is not intended to replace advice given to you by your health care provider. Make sure you discuss any  questions you have with your health care provider.   Document Released: 03/17/2005 Document Revised: 12/06/2014 Document Reviewed: 10/14/2012 Elsevier Interactive Patient Education 2016 Elsevier Inc.  

## 2015-01-30 DIAGNOSIS — J310 Chronic rhinitis: Secondary | ICD-10-CM | POA: Diagnosis not present

## 2015-01-30 DIAGNOSIS — J342 Deviated nasal septum: Secondary | ICD-10-CM | POA: Diagnosis not present

## 2015-01-30 DIAGNOSIS — J324 Chronic pansinusitis: Secondary | ICD-10-CM | POA: Diagnosis not present

## 2015-02-03 ENCOUNTER — Other Ambulatory Visit: Payer: Self-pay | Admitting: Emergency Medicine

## 2015-02-06 NOTE — Telephone Encounter (Signed)
Dr. Everlene Farrier,  Douglas [name] is requesting more Tessalon, he states his cough during the day is better, but he is still coughing through the night.  Can we refill or RTC

## 2015-02-07 ENCOUNTER — Encounter: Payer: Medicare Other | Admitting: Gastroenterology

## 2015-02-07 DIAGNOSIS — H01003 Unspecified blepharitis right eye, unspecified eyelid: Secondary | ICD-10-CM | POA: Diagnosis not present

## 2015-02-07 DIAGNOSIS — H40021 Open angle with borderline findings, high risk, right eye: Secondary | ICD-10-CM | POA: Diagnosis not present

## 2015-02-07 DIAGNOSIS — H1013 Acute atopic conjunctivitis, bilateral: Secondary | ICD-10-CM | POA: Diagnosis not present

## 2015-02-07 DIAGNOSIS — H524 Presbyopia: Secondary | ICD-10-CM | POA: Diagnosis not present

## 2015-02-09 ENCOUNTER — Encounter: Payer: Self-pay | Admitting: Gastroenterology

## 2015-02-09 ENCOUNTER — Ambulatory Visit (AMBULATORY_SURGERY_CENTER): Payer: Medicare Other | Admitting: Gastroenterology

## 2015-02-09 VITALS — BP 143/84 | HR 63 | Temp 96.2°F | Resp 14 | Ht 71.0 in | Wt 198.0 lb

## 2015-02-09 DIAGNOSIS — K635 Polyp of colon: Secondary | ICD-10-CM | POA: Diagnosis not present

## 2015-02-09 DIAGNOSIS — D123 Benign neoplasm of transverse colon: Secondary | ICD-10-CM | POA: Diagnosis not present

## 2015-02-09 DIAGNOSIS — R195 Other fecal abnormalities: Secondary | ICD-10-CM | POA: Diagnosis not present

## 2015-02-09 DIAGNOSIS — Z86010 Personal history of colonic polyps: Secondary | ICD-10-CM

## 2015-02-09 DIAGNOSIS — D122 Benign neoplasm of ascending colon: Secondary | ICD-10-CM

## 2015-02-09 DIAGNOSIS — K219 Gastro-esophageal reflux disease without esophagitis: Secondary | ICD-10-CM

## 2015-02-09 DIAGNOSIS — D125 Benign neoplasm of sigmoid colon: Secondary | ICD-10-CM

## 2015-02-09 DIAGNOSIS — D120 Benign neoplasm of cecum: Secondary | ICD-10-CM | POA: Diagnosis not present

## 2015-02-09 MED ORDER — SODIUM CHLORIDE 0.9 % IV SOLN: 500.0000 mL | INTRAVENOUS | Status: DC

## 2015-02-09 MED ORDER — OMEPRAZOLE 20 MG PO CPDR: 20.0000 mg | DELAYED_RELEASE_CAPSULE | Freq: Every day | ORAL | Status: DC

## 2015-02-09 NOTE — Op Note (Signed)
[name] [place], [address]   COLONOSCOPY PROCEDURE REPORT  PATIENT: Douglas, [name]  MR#: [id number] BIRTHDATE: [birth year] , 79  yrs. old GENDER: male ENDOSCOPIST: [name], MD, [facility] REFERRED BY: Lou Miner, M.D. PROCEDURE DATE:  02/09/2015 PROCEDURE:   Colonoscopy, diagnostic, Colonoscopy with biopsy, and Colonoscopy with snare polypectomy First Screening Colonoscopy - Avg.  risk and is 50 yrs.  old or older - No.  Prior Negative Screening - Now for repeat screening. N/A  History of Adenoma - Now for follow-up colonoscopy & has been > or = to 3 yrs.  No.  It has been less than 3 yrs since last colonoscopy.  Other: See Comments  Polyps removed today? Yes ASA CLASS:   Class II INDICATIONS:Surveillance due to prior colonic neoplasia, PH Colon Adenoma, and heme-positive stool. MEDICATIONS: Monitored anesthesia care, Propofol 300 mg IV, and lidocaine 40 mg IV DESCRIPTION OF PROCEDURE:   After the risks benefits and alternatives of the procedure were thoroughly explained, informed consent was obtained.  The digital rectal exam revealed no abnormalities of the rectum.   The LB [id number]  endoscope was introduced through the anus and advanced to the cecum, which was identified by both the appendix and ileocecal valve. No adverse events experienced.   The quality of the prep was good.  (Suprep was used)  The instrument was then slowly withdrawn as the colon was fully examined. Estimated blood loss is zero unless otherwise noted in this procedure report.    COLON FINDINGS: Eight sessile polyps measuring 6-8 mm in size were found in the transverse colon (3), sigmoid colon (1), at the cecum (2), and in the ascending colon (2). Polypectomies were performed with a cold snare.  The resection was complete, the polyp tissue was completely retrieved and sent to histology. A sessile polyp measuring 4 mm in size was found at the ileocecal valve.   A polypectomy was performed with cold forceps. The resection was complete, the polyp tissue was completely retrieved and sent to histology. The examination was otherwise normal.  Retroflexed views revealed no abnormalities. The time to cecum = 2.5 Withdrawal time = 12.9   The scope was withdrawn and the procedure completed.  COMPLICATIONS: There were no immediate complications.    ENDOSCOPIC IMPRESSION: 1.   Eight sessile polyps in the transverse colon, sigmoid colon, at the cecum, and in the ascending colon; polypectomies performed with a cold snare 2.   Sessile polyp at the ileocecal valve; polypectomy performed with cold forceps 3.   The examination was otherwise normal  RECOMMENDATIONS: 1.  Await pathology results 2.  Repeat Colonoscopy in 2 years  eSigned:  [name], MD, [facility] 02/09/2015 3:34 PM     PATIENT NAME:  Douglas, [name] MR#: [id number]

## 2015-02-09 NOTE — Progress Notes (Signed)
Stable to RR 

## 2015-02-09 NOTE — Patient Instructions (Signed)
YOU HAD AN ENDOSCOPIC PROCEDURE TODAY AT THE [facility]:   Refer to the procedure report that was given to you for any specific questions about what was found during the examination.  If the procedure report does not answer your questions, please call your gastroenterologist to clarify.  If you requested that your care partner not be given the details of your procedure findings, then the procedure report has been included in a sealed envelope for you to review at your convenience later.  YOU SHOULD EXPECT: Some feelings of bloating in the abdomen. Passage of more gas than usual.  Walking can help get rid of the air that was put into your GI tract during the procedure and reduce the bloating. If you had a lower endoscopy (such as a colonoscopy or flexible sigmoidoscopy) you may notice spotting of blood in your stool or on the toilet paper. If you underwent a bowel prep for your procedure, you may not have a normal bowel movement for a few days.  Please Note:  You might notice some irritation and congestion in your nose or some drainage.  This is from the oxygen used during your procedure.  There is no need for concern and it should clear up in a day or so.  SYMPTOMS TO REPORT IMMEDIATELY:   Following lower endoscopy (colonoscopy or flexible sigmoidoscopy):  Excessive amounts of blood in the stool  Significant tenderness or worsening of abdominal pains  Swelling of the abdomen that is new, acute  Fever of 100F or higher   Following upper endoscopy (EGD)  Vomiting of blood or coffee ground material  New chest pain or pain under the shoulder blades  Painful or persistently difficult swallowing  New shortness of breath  Fever of 100F or higher  Black, tarry-looking stools  For urgent or emergent issues, a gastroenterologist can be reached at any hour by calling [phone].   DIET: Your first meal following the procedure should be a small meal and then it is ok to progress to  your normal diet. Heavy or fried foods are harder to digest and may make you feel nauseous or bloated.  Likewise, meals heavy in dairy and vegetables can increase bloating.  Drink plenty of fluids but you should avoid alcoholic beverages for 24 hours.  ACTIVITY:  You should plan to take it easy for the rest of today and you should NOT DRIVE or use heavy machinery until tomorrow (because of the sedation medicines used during the test).    FOLLOW UP: Our staff will call the number listed on your records the next business day following your procedure to check on you and address any questions or concerns that you may have regarding the information given to you following your procedure. If we do not reach you, we will leave a message.  However, if you are feeling well and you are not experiencing any problems, there is no need to return our call.  We will assume that you have returned to your regular daily activities without incident.  If any biopsies were taken you will be contacted by phone or by letter within the next 1-3 weeks.  Please call us at [phone] if you have not heard about the biopsies in 3 weeks.    SIGNATURES/CONFIDENTIALITY: You and/or your care partner have signed paperwork which will be entered into your electronic medical record.  These signatures attest to the fact that that the information above on your After Visit Summary has been reviewed   and is understood.  Full responsibility of the confidentiality of this discharge information lies with you and/or your care-partner. 

## 2015-02-09 NOTE — Progress Notes (Signed)
Called to room to assist during endoscopic procedure.  Patient ID and intended procedure confirmed with present staff. Received instructions for my participation in the procedure from the performing physician.  

## 2015-02-09 NOTE — Op Note (Addendum)
[facility]  [address] [place], [id number]   ENDOSCOPY PROCEDURE REPORT  PATIENT: Douglas, [name]  MR#: [id number] BIRTHDATE: [birth year] , 79  yrs. old GENDER: male ENDOSCOPIST: [name], MD, [facility] REFERRED BY:  Lou Miner, M.D. PROCEDURE DATE:  02/09/2015 PROCEDURE:  EGD, diagnostic ASA CLASS:     Class II INDICATIONS:  heme positive stool. GERD. MEDICATIONS: Monitored anesthesia care, Propofol 100 mg IV, and Residual sedation present TOPICAL ANESTHETIC: none DESCRIPTION OF PROCEDURE: After the risks benefits and alternatives of the procedure were thoroughly explained, informed consent was obtained.  The [id number] endoscope was introduced through the mouth and advanced to the second portion of the duodenum , Without limitations.  The instrument was slowly withdrawn as the mucosa was fully examined.    STOMACH: Mild erosive gastritis  was found in the gastric antrum. The stomach otherwise appeared normal. DUODENUM: Mild duodenal inflammation, mild and nonerosive, was found in the duodenal bulb.   The duodenal mucosa showed no abnormalities in the 2nd part of the duodenum. ESOPHAGUS: The mucosa of the esophagus appeared normal.  Retroflexed views revealed no abnormalities.     The scope was then withdrawn from the patient and the procedure completed.  COMPLICATIONS: There were no immediate complications.  ENDOSCOPIC IMPRESSION: 1.   Mild erosive gastritis in the gastric antrum 2.   Mild nonerosive duodenitis in the duodenal bulb 3.   The EGD otherwise appeared normal  RECOMMENDATIONS: 1. Continue PPI daily 2. ASA 81 mg daily is ok. Minimize additional NSAID useage  eSigned:  [name], MD, [facility] 02/09/2015 3:49 PM Revised: 02/09/2015 3:49 PM

## 2015-02-12 ENCOUNTER — Telehealth: Payer: Self-pay | Admitting: *Deleted

## 2015-02-12 NOTE — Telephone Encounter (Signed)
  Follow up Call-  Call back number 02/09/2015 06/03/2012  Post procedure Call Back phone  # [phone]  Permission to leave phone message Yes Yes     Patient questions:  Do you have a fever, pain , or abdominal swelling? No. Pain Score  0 *  Have you tolerated food without any problems? Yes.    Have you been able to return to your normal activities? Yes.    Do you have any questions about your discharge instructions: Diet   No. Medications  No. Follow up visit  No.  Do you have questions or concerns about your Care? No.  Actions: * If pain score is 4 or above: No action needed, pain <4.  Pt. Stated that he was sneezing and nose running,explained to pt. It was from oxygen he received and it will clear up,pt. Verbalize understanding.

## 2015-02-19 ENCOUNTER — Encounter: Payer: Self-pay | Admitting: Gastroenterology

## 2015-03-02 DIAGNOSIS — J0191 Acute recurrent sinusitis, unspecified: Secondary | ICD-10-CM

## 2015-03-14 DIAGNOSIS — M810 Age-related osteoporosis without current pathological fracture: Secondary | ICD-10-CM | POA: Diagnosis not present

## 2015-03-14 DIAGNOSIS — C61 Malignant neoplasm of prostate: Secondary | ICD-10-CM | POA: Diagnosis not present

## 2015-03-21 DIAGNOSIS — C61 Malignant neoplasm of prostate: Secondary | ICD-10-CM | POA: Diagnosis not present

## 2015-03-21 DIAGNOSIS — N320 Bladder-neck obstruction: Secondary | ICD-10-CM | POA: Diagnosis not present

## 2015-03-21 DIAGNOSIS — E291 Testicular hypofunction: Secondary | ICD-10-CM | POA: Diagnosis not present

## 2015-03-21 DIAGNOSIS — E559 Vitamin D deficiency, unspecified: Secondary | ICD-10-CM | POA: Diagnosis not present

## 2015-04-25 ENCOUNTER — Encounter: Payer: Self-pay | Admitting: Physician Assistant

## 2015-04-25 DIAGNOSIS — N5201 Erectile dysfunction due to arterial insufficiency: Secondary | ICD-10-CM | POA: Insufficient documentation

## 2015-04-25 DIAGNOSIS — E559 Vitamin D deficiency, unspecified: Secondary | ICD-10-CM | POA: Insufficient documentation

## 2015-04-25 DIAGNOSIS — N320 Bladder-neck obstruction: Secondary | ICD-10-CM | POA: Insufficient documentation

## 2015-04-25 DIAGNOSIS — M810 Age-related osteoporosis without current pathological fracture: Secondary | ICD-10-CM | POA: Insufficient documentation

## 2015-04-30 ENCOUNTER — Encounter: Payer: Self-pay | Admitting: Family Medicine

## 2015-04-30 ENCOUNTER — Ambulatory Visit (INDEPENDENT_AMBULATORY_CARE_PROVIDER_SITE_OTHER): Payer: Medicare Other | Admitting: Family Medicine

## 2015-04-30 VITALS — BP 126/66 | HR 85 | Temp 98.3°F | Resp 16 | Ht 71.25 in | Wt 209.2 lb

## 2015-04-30 DIAGNOSIS — R7989 Other specified abnormal findings of blood chemistry: Secondary | ICD-10-CM

## 2015-04-30 DIAGNOSIS — Z0000 Encounter for general adult medical examination without abnormal findings: Secondary | ICD-10-CM | POA: Diagnosis not present

## 2015-04-30 DIAGNOSIS — M67912 Unspecified disorder of synovium and tendon, left shoulder: Secondary | ICD-10-CM | POA: Diagnosis not present

## 2015-04-30 DIAGNOSIS — E119 Type 2 diabetes mellitus without complications: Secondary | ICD-10-CM

## 2015-04-30 DIAGNOSIS — E559 Vitamin D deficiency, unspecified: Secondary | ICD-10-CM | POA: Diagnosis not present

## 2015-04-30 DIAGNOSIS — E785 Hyperlipidemia, unspecified: Secondary | ICD-10-CM | POA: Diagnosis not present

## 2015-04-30 LAB — GLUCOSE, POCT (MANUAL RESULT ENTRY): POC GLUCOSE: 154 mg/dL — AB (ref 70–99)

## 2015-04-30 LAB — POCT GLYCOSYLATED HEMOGLOBIN (HGB A1C): Hemoglobin A1C: 7.7

## 2015-04-30 NOTE — Progress Notes (Signed)
   Subjective:    Patient ID: Douglas [name], male    DOB: 08-24-1935, 80 y.o.   MRN: [id number]  HPI    Review of Systems  Constitutional: Negative.   [name]: Negative.   Eyes: Negative.   Respiratory: Positive for wheezing.   Cardiovascular: Negative.   Gastrointestinal: Negative.   Endocrine: Negative.   Genitourinary: Negative.   Musculoskeletal: Negative.   Skin: Negative.   Allergic/Immunologic: Negative.   Neurological: Negative.   Hematological: Negative.   Psychiatric/Behavioral: Negative.        Objective:   Physical Exam        Assessment & Plan:

## 2015-04-30 NOTE — Progress Notes (Addendum)
Subjective:  By signing my name below, I, [name], attest that this documentation has been prepared under the direction and in the presence of [name], MD. Electronically Signed: [name], [place]. 04/30/2015 , 2:23 PM .  Patient was seen in Room 25 .   Patient ID: Douglas [name], male    DOB: 1935-05-16, 80 y.o.   MRN: [id number] Chief Complaint  Patient presents with  . Annual Exam   HPI Douglas [name] is a 80 y.o. male Pt is here for a physical. Last seen by me in Jan 2016 for wellness exam. He has a history of HLD, prostate cancer, GERD, vitamin D deficiency and osteoporosis, gastroenterologist is Dr. Fuller Plan.   Left Shoulder  He has some discomfort in his left shoulder. It bothers him the most when he sleeps on his left side. He's been taking some NSAIDs.   Cancer Screening Colonoscopy: done in Nov 2016 with 8 polyps, pathology was tubular adenoma; plan on repeat colonoscopy in 2 years. Also had endoscopy for GERD and gastritis. Advised daily PPI and aspirin 81mg  qd. No additional NSAIDs.   Prostate cancer: followed by urology (Dr. [name]), last seen in Dec 2016. PSA followed by urologist. Had seed implants approximately 7-8 years ago. He's taking uroxatral and testosterone.   Immunizations Immunization History  Administered Date(s) Administered  . Influenza Split 12/30/2010, 12/25/2011  . Influenza,inj,Quad PF,36+ Mos 12/07/2012, 12/13/2013, 12/06/2014  . Pneumococcal Conjugate-13 04/24/2014  . Pneumococcal Polysaccharide-23 05/31/2001, 06/08/2009  . Td 06/12/2008  . Zoster 03/31/2008   He's up to date.   Depression Depression screen [facility] 2/9 04/30/2015 01/17/2015 01/12/2015 08/16/2014 04/24/2014  Decreased Interest 0 0 0 0 0  Down, Depressed, Hopeless 0 0 0 0 0  PHQ - 2 Score 0 0 0 0 0   Fall Screening 0 falls in past year.   Functional status screening No positive screening per survey.   Vision  Visual Acuity Screening   Right eye Left eye  Both eyes  Without correction:     With correction: 20/30 20/20 20/25    He plans to see his ophthalmologist (Dr. [name]) in March. He last saw her in March 2016.   Dentist He last saw dentist on Apr 15, 2015.   Exercise He walks an hour each day on the treadmill.   Advanced directives He has a living will. He will send a copy later.   HLD Lab Results  Component Value Date   ALT 35 03/08/2014   AST 23 03/08/2014   ALKPHOS 55 03/08/2014   BILITOT 0.4 03/08/2014   Lab Results  Component Value Date   CHOL 107* 11/09/2014   HDL 43 11/09/2014   LDLCALC 45 11/09/2014   TRIG 94 11/09/2014   CHOLHDL 2.5 11/09/2014   Controlled last visit, recommended cut simvastatin half nightly.  He still takes 1 tablet qd. He denies myalgia.   DM Prior pre-DM, but a1c 7.1 in August. Advised to watch diet. Avoid sugar containing beverages.  Referred to nutritionist in Jan 2016 visit. He denies seeing nutritionist recently.   Wt Readings from Last 3 Encounters:  04/30/15 209 lb 3.2 oz (94.892 kg)  02/09/15 198 lb (89.812 kg)  01/17/15 201 lb (91.173 kg)   Lab Results  Component Value Date   MICROALBUR 0.4 04/24/2014   Low Vitamin D H/o low vitamin D, takes 2000 units qd. No recent data seen in [facility], but had level 33 on 03/14/15 at urology.   Breathing He denies recent shortness  of breath or wheezing. He denies asthma.   Patient Active Problem List   Diagnosis Date Noted  . Bladder outlet obstruction 04/25/2015  . Erectile dysfunction due to arterial insufficiency 04/25/2015  . Osteoporosis 04/25/2015  . Vitamin D deficiency 04/25/2015  . Heme positive stool 03/16/2014  . Abdominal pain, epigastric 03/16/2014  . Prostate cancer ([place]) 05/30/2011  . Dyslipidemia 05/30/2011  . Hypogonadism male 05/30/2011  . Acute sinusitis 01/07/2007  . RHINITIS 08/25/2006   Past Medical History  Diagnosis Date  . Cancer ([place])   . Hyperlipidemia   . Cataract   . Low testosterone   .  Adenomatous colon polyp 03/2001  . Allergy     SEASONAL  . GERD (gastroesophageal reflux disease)    Past Surgical History  Procedure Laterality Date  . Spine surgery    . Appendectomy    . Eye surgery    . Prostate surgery    . Colonoscopy    . Polypectomy     No Known Allergies Prior to Admission medications   Medication Sig Start Date End Date Taking? Authorizing Provider  albuterol (PROVENTIL HFA;VENTOLIN HFA) 108 (90 BASE) MCG/ACT inhaler Inhale 2 puffs into the lungs every 4 (four) hours as needed for wheezing or shortness of breath (cough, shortness of breath or wheezing.). 01/17/15   Darlyne Russian, MD  alfuzosin (UROXATRAL) 10 MG 24 hr tablet  11/09/14   Historical Provider, MD  aspirin 81 MG tablet Take 81 mg by mouth daily.    Historical Provider, MD  azithromycin (ZITHROMAX) 250 MG tablet Take 2 tabs PO x 1 dose, then 1 tab PO QD x 4 days Patient not taking: Reported on 02/09/2015 01/12/15   Darlyne Russian, MD  benzonatate (TESSALON) 100 MG capsule TAKE 1 TO 2 CAPSULES BY MOUTH 3 TIMES A DAY AS NEEDED FOR COUGH Patient not taking: Reported on 02/09/2015 02/07/15   Darlyne Russian, MD  Cholecalciferol (VITAMIN D) 2000 UNITS tablet Take 2,000 Units by mouth daily.    Historical Provider, MD  fluticasone (FLONASE) 50 MCG/ACT nasal spray PLACE 2 SPRAYS INTO THE NOSE DAILY. 11/15/14   Elby Beck, FNP  omeprazole (PRILOSEC) 20 MG capsule Take 1 capsule (20 mg total) by mouth daily. 02/09/15   [name], MD  simvastatin (ZOCOR) 40 MG tablet TAKE 1 TABLET BY MOUTH EVERY DAY AT 6 PM 11/15/14   Elby Beck, FNP  Testosterone 30 MG/ACT SOLN Place 30 g onto the skin.    Historical Provider, MD  vitamin C (ASCORBIC ACID) 500 MG tablet Take 500 mg by mouth daily.    Historical Provider, MD   Social History   Social History  . Marital Status: Married    Spouse Name: N/A  . Number of Children: 3  . Years of Education: 2 years [employer]   Occupational History  . RETIRED     Social History Main Topics  . Smoking status: Never Smoker   . Smokeless tobacco: Never Used  . Alcohol Use: No     Comment: once a year  . Drug Use: No  . Sexual Activity: Yes   Other Topics Concern  . Not on file   Social History Narrative   Married. Education: [employer]. Exercise: Yes.   Review of Systems 13 point ROS - negative, except for wheezing Treated wheezing by Dr. Everlene Farrier with albuterol inhaler and prednisone in October, no known history of asthma.     Objective:   Physical Exam  Constitutional: He  is oriented to person, place, and time. He appears well-developed and well-nourished.  [name]:  Head: Normocephalic and atraumatic.  Right Ear: External ear normal.  Left Ear: External ear normal.  Mouth/Throat: Oropharynx is clear and moist.  Eyes: Conjunctivae and EOM are normal. Pupils are equal, round, and reactive to light.  Neck: Normal range of motion. Neck supple. No thyromegaly present.  Cardiovascular: Normal rate, regular rhythm, normal heart sounds and intact distal pulses.   Pulmonary/Chest: Effort normal and breath sounds normal. No respiratory distress. He has no wheezes.  Abdominal: Soft. He exhibits no distension. There is no tenderness.  Musculoskeletal: Normal range of motion. He exhibits no edema or tenderness.  Left shoulder: full rom, discomfort in external rotation but full RTC strength, negative hawkin, negative neer   Lymphadenopathy:    He has no cervical adenopathy.  Neurological: He is alert and oriented to person, place, and time. He has normal reflexes.  Skin: Skin is warm and dry.  Psychiatric: He has a normal mood and affect. His behavior is normal.  Vitals reviewed.   Filed Vitals:   04/30/15 1342  BP: 126/66  Pulse: 85  Temp: 98.3 F (36.8 C)  TempSrc: Oral  Resp: 16  Height: 5' 11.25" (1.81 m)  Weight: 209 lb 3.2 oz (94.892 kg)  SpO2: 98%    Results for orders placed or performed in visit on 04/30/15  POCT glycosylated  hemoglobin (Hb A1C)  Result Value Ref Range   Hemoglobin A1C 7.7   POCT glucose (manual entry)  Result Value Ref Range   POC Glucose 154 (A) 70 - 99 mg/dl      Assessment & Plan:   JACOBSON MILAN is a 80 y.o. male Medicare annual wellness visit, subsequent  --anticipatory guidance as below in AVS, screening labs above. Health maintenance items as above in HPI discussed/recommended as applicable. Other screening including functional status as above without concerns.  Hyperlipidemia - Plan: COMPLETE METABOLIC PANEL WITH GFR, Lipid panel   Labs pending. Continue Zocor same dose for now.  Low serum vitamin D/Vitamin D deficiency  - On supplementation. Recent testing as above.  Diabetes mellitus type 2, diet-controlled ([place]) - Plan: Microalbumin, urine, POCT glycosylated hemoglobin (Hb A1C), POCT glucose (manual entry), Ambulatory referral to diabetic education  -Decreased control. Discussed diabetes diagnosis, as he appears to have been unaware of this. This had been discussed with him on previous visits. We'll refer to diabetic educator/classes. Diet and exercise stressed, then recheck A1c in 3 months. If still elevated A1c, plan on starting metformin at that time.  Tendinopathy of rotator cuff, left  -Recurrent, possible component of bursitis along with rotator cuff tendinosis. Home exercises discussed, handout given, follow-up if persistent or worsening symptoms to consider repeat injection or referral to orthopedist.  No orders of the defined types were placed in this encounter.   Patient Instructions  Your diabetes test is higher today.  We will refer you again to diabetic classes, but cut back on sugar containing beverages and sweets as much as possible for now. Repeat sugar test in 3 months, and if still elevated, may need to start medicine at that time. Let me know if you have questions in the meantime.  Follow up as planned with your eye care provider, but let them know you do have  diabetes.   You should receive a call or letter about your lab results within the next week to 10 days.   See information and exercises for your shoulder below,  and follow up if this is not improving.  Stop advil or alleve - only tylenol over the counter due to your stomach issues previously.   Return to the clinic or go to the nearest emergency room if any of your symptoms worsen or new symptoms occur.   Diabetes Mellitus and Food It is important for you to manage your blood sugar (glucose) level. Your blood glucose level can be greatly affected by what you eat. Eating healthier foods in the appropriate amounts throughout the day at about the same time each day will help you control your blood glucose level. It can also help slow or prevent worsening of your diabetes mellitus. Healthy eating may even help you improve the level of your blood pressure and reach or maintain a healthy weight.  General recommendations for healthful eating and cooking habits include:  Eating meals and snacks regularly. Avoid going long periods of time without eating to lose weight.  Eating a diet that consists mainly of plant-based foods, such as fruits, vegetables, nuts, legumes, and whole grains.  Using low-heat cooking methods, such as baking, instead of high-heat cooking methods, such as deep frying. Work with your dietitian to make sure you understand how to use the Nutrition Facts information on food labels. HOW CAN FOOD AFFECT ME? Carbohydrates Carbohydrates affect your blood glucose level more than any other type of food. Your dietitian will help you determine how many carbohydrates to eat at each meal and teach you how to count carbohydrates. Counting carbohydrates is important to keep your blood glucose at a healthy level, especially if you are using insulin or taking certain medicines for diabetes mellitus. Alcohol Alcohol can cause sudden decreases in blood glucose (hypoglycemia), especially if you use  insulin or take certain medicines for diabetes mellitus. Hypoglycemia can be a life-threatening condition. Symptoms of hypoglycemia (sleepiness, dizziness, and disorientation) are similar to symptoms of having too much alcohol.  If your health care provider has given you approval to drink alcohol, do so in moderation and use the following guidelines:  Women should not have more than one drink per day, and men should not have more than two drinks per day. One drink is equal to:  12 oz of beer.  5 oz of wine.  1 oz of hard liquor.  Do not drink on an empty stomach.  Keep yourself hydrated. Have water, diet soda, or unsweetened iced tea.  Regular soda, juice, and other mixers might contain a lot of carbohydrates and should be counted. WHAT FOODS ARE NOT RECOMMENDED? As you make food choices, it is important to remember that all foods are not the same. Some foods have fewer nutrients per serving than other foods, even though they might have the same number of calories or carbohydrates. It is difficult to get your body what it needs when you eat foods with fewer nutrients. Examples of foods that you should avoid that are high in calories and carbohydrates but low in nutrients include:  Trans fats (most processed foods list trans fats on the Nutrition Facts label).  Regular soda.  Juice.  Candy.  Sweets, such as cake, pie, doughnuts, and cookies.  Fried foods. WHAT FOODS CAN I EAT? Eat nutrient-rich foods, which will nourish your body and keep you healthy. The food you should eat also will depend on several factors, including:  The calories you need.  The medicines you take.  Your weight.  Your blood glucose level.  Your blood pressure level.  Your cholesterol level. You  should eat a variety of foods, including:  Protein.  Lean cuts of meat.  Proteins low in saturated fats, such as fish, egg whites, and beans. Avoid processed meats.  Fruits and vegetables.  Fruits and  vegetables that may help control blood glucose levels, such as apples, mangoes, and yams.  Dairy products.  Choose fat-free or low-fat dairy products, such as milk, yogurt, and cheese.  Grains, bread, pasta, and rice.  Choose whole grain products, such as multigrain bread, whole oats, and brown rice. These foods may help control blood pressure.  Fats.  Foods containing healthful fats, such as nuts, avocado, olive oil, canola oil, and fish. DOES EVERYONE WITH DIABETES MELLITUS HAVE THE SAME MEAL PLAN? Because every person with diabetes mellitus is different, there is not one meal plan that works for everyone. It is very important that you meet with a dietitian who will help you create a meal plan that is just right for you.   This information is not intended to replace advice given to you by your health care provider. Make sure you discuss any questions you have with your health care provider.   Document Released: 12/12/2004 Document Revised: 04/07/2014 Document Reviewed: 02/11/2013 Elsevier Interactive Patient Education 2016 Else[employer] Inc[employer]  Impingement Syndrome, Rotator Cuff, Bursitis With Rehab Impingement syndrome is a condition that involves inflammation of the tendons of the rotator cuff and the subacromial bursa, that causes pain in the shoulder. The rotator cuff consists of four tendons and muscles that control much of the shoulder and upper arm function. The subacromial bursa is a fluid filled sac that helps reduce friction between the rotator cuff and one of the bones of the shoulder (acromion). Impingement syndrome is usually an overuse injury that causes swelling of the bursa (bursitis), swelling of the tendon (tendonitis), and/or a tear of the tendon (strain). Strains are classified into three categories. Grade 1 strains cause pain, but the tendon is not lengthened. Grade 2 strains include a lengthened ligament, due to the ligament being stretched or partially ruptured. With grade 2  strains there is still function, although the function may be decreased. Grade 3 strains include a complete tear of the tendon or muscle, and function is usually impaired. SYMPTOMS   Pain around the shoulder, often at the outer portion of the upper arm.  Pain that gets worse with shoulder function, especially when reaching overhead or lifting.  Sometimes, aching when not using the arm.  Pain that wakes you up at night.  Sometimes, tenderness, swelling, warmth, or redness over the affected area.  Loss of strength.  Limited motion of the shoulder, especially reaching behind the back (to the back pocket or to unhook bra) or across your body.  Crackling sound (crepitation) when moving the arm.  Biceps tendon pain and inflammation (in the front of the shoulder). Worse when bending the elbow or lifting. CAUSES  Impingement syndrome is often an overuse injury, in which chronic (repetitive) motions cause the tendons or bursa to become inflamed. A strain occurs when a force is paced on the tendon or muscle that is greater than it can withstand. Common mechanisms of injury include: Stress from sudden increase in duration, frequency, or intensity of training.  Direct hit (trauma) to the shoulder.  Aging, erosion of the tendon with normal use.  Bony bump on shoulder (acromial spur). RISK INCREASES WITH:  Contact sports (football, wrestling, boxing).  Throwing sports (baseball, tennis, volleyball).  Weightlifting and bodybuilding.  Heavy labor.  Previous injury to the  rotator cuff, including impingement.  Poor shoulder strength and flexibility.  Failure to warm up properly before activity.  Inadequate protective equipment.  Old age.  Bony bump on shoulder (acromial spur). PREVENTION   Warm up and stretch properly before activity.  Allow for adequate recovery between workouts.  Maintain physical fitness:  Strength, flexibility, and endurance.  Cardiovascular  fitness.  Learn and use proper exercise technique. PROGNOSIS  If treated properly, impingement syndrome usually goes away within 6 weeks. Sometimes surgery is required.  RELATED COMPLICATIONS   Longer healing time if not properly treated, or if not given enough time to heal.  Recurring symptoms, that result in a chronic condition.  Shoulder stiffness, frozen shoulder, or loss of motion.  Rotator cuff tendon tear.  Recurring symptoms, especially if activity is resumed too soon, with overuse, with a direct blow, or when using poor technique. TREATMENT  Treatment first involves the use of ice and medicine, to reduce pain and inflammation. The use of strengthening and stretching exercises may help reduce pain with activity. These exercises may be performed at home or with a therapist. If non-surgical treatment is unsuccessful after more than 6 months, surgery may be advised. After surgery and rehabilitation, activity is usually possible in 3 months.  MEDICATION  If pain medicine is needed, nonsteroidal anti-inflammatory medicines (aspirin and ibuprofen), or other minor pain relievers (acetaminophen), are often advised.  Do not take pain medicine for 7 days before surgery.  Prescription pain relievers may be given, if your caregiver thinks they are needed. Use only as directed and only as much as you need.  Corticosteroid injections may be given by your caregiver. These injections should be reserved for the most serious cases, because they may only be given a certain number of times. HEAT AND COLD  Cold treatment (icing) should be applied for 10 to 15 minutes every 2 to 3 hours for inflammation and pain, and immediately after activity that aggravates your symptoms. Use ice packs or an ice massage.  Heat treatment may be used before performing stretching and strengthening activities prescribed by your caregiver, physical therapist, or athletic trainer. Use a heat pack or a warm water  soak. SEEK MEDICAL CARE IF:   Symptoms get worse or do not improve in 4 to 6 weeks, despite treatment.  New, unexplained symptoms develop. (Drugs used in treatment may produce side effects.) EXERCISES  RANGE OF MOTION (ROM) AND STRETCHING EXERCISES - Impingement Syndrome (Rotator Cuff  Tendinitis, Bursitis) These exercises may help you when beginning to rehabilitate your injury. Your symptoms may go away with or without further involvement from your physician, physical therapist or athletic trainer. While completing these exercises, remember:   Restoring tissue flexibility helps normal motion to return to the joints. This allows healthier, less painful movement and activity.  An effective stretch should be held for at least 30 seconds.  A stretch should never be painful. You should only feel a gentle lengthening or release in the stretched tissue. STRETCH - Flexion, Standing  Stand with good posture. With an underhand grip on your right / left hand, and an overhand grip on the opposite hand, grasp a broomstick or cane so that your hands are a little more than shoulder width apart.  Keeping your right / left elbow straight and shoulder muscles relaxed, push the stick with your opposite hand, to raise your right / left arm in front of your body and then overhead. Raise your arm until you feel a stretch in your right /  left shoulder, but before you have increased shoulder pain.  Try to avoid shrugging your right / left shoulder as your arm rises, by keeping your shoulder blade tucked down and toward your mid-back spine. Hold for __________ seconds.  Slowly return to the starting position. Repeat __________ times. Complete this exercise __________ times per day. STRETCH - Abduction, Supine  Lie on your back. With an underhand grip on your right / left hand and an overhand grip on the opposite hand, grasp a broomstick or cane so that your hands are a little more than shoulder width  apart.  Keeping your right / left elbow straight and your shoulder muscles relaxed, push the stick with your opposite hand, to raise your right / left arm out to the side of your body and then overhead. Raise your arm until you feel a stretch in your right / left shoulder, but before you have increased shoulder pain.  Try to avoid shrugging your right / left shoulder as your arm rises, by keeping your shoulder blade tucked down and toward your mid-back spine. Hold for __________ seconds.  Slowly return to the starting position. Repeat __________ times. Complete this exercise __________ times per day. ROM - Flexion, Active-Assisted  Lie on your back. You may bend your knees for comfort.  Grasp a broomstick or cane so your hands are about shoulder width apart. Your right / left hand should grip the end of the stick, so that your hand is positioned "thumbs-up," as if you were about to shake hands.  Using your healthy arm to lead, raise your right / left arm overhead, until you feel a gentle stretch in your shoulder. Hold for __________ seconds.  Use the stick to assist in returning your right / left arm to its starting position. Repeat __________ times. Complete this exercise __________ times per day.  ROM - Internal Rotation, Supine   Lie on your back on a firm surface. Place your right / left elbow about 60 degrees away from your side. Elevate your elbow with a folded towel, so that the elbow and shoulder are the same height.  Using a broomstick or cane and your strong arm, pull your right / left hand toward your body until you feel a gentle stretch, but no increase in your shoulder pain. Keep your shoulder and elbow in place throughout the exercise.  Hold for __________ seconds. Slowly return to the starting position. Repeat __________ times. Complete this exercise __________ times per day. STRETCH - Internal Rotation  Place your right / left hand behind your back, palm up.  Throw a  towel or belt over your opposite shoulder. Grasp the towel with your right / left hand.  While keeping an upright posture, gently pull up on the towel, until you feel a stretch in the front of your right / left shoulder.  Avoid shrugging your right / left shoulder as your arm rises, by keeping your shoulder blade tucked down and toward your mid-back spine.  Hold for __________ seconds. Release the stretch, by lowering your healthy hand. Repeat __________ times. Complete this exercise __________ times per day. ROM - Internal Rotation   Using an underhand grip, grasp a stick behind your back with both hands.  While standing upright with good posture, slide the stick up your back until you feel a mild stretch in the front of your shoulder.  Hold for __________ seconds. Slowly return to your starting position. Repeat __________ times. Complete this exercise __________ times per day.  STRETCH - Posterior Shoulder Capsule   Stand or sit with good posture. Grasp your right / left elbow and draw it across your chest, keeping it at the same height as your shoulder.  Pull your elbow, so your upper arm comes in closer to your chest. Pull until you feel a gentle stretch in the back of your shoulder.  Hold for __________ seconds. Repeat __________ times. Complete this exercise __________ times per day. STRENGTHENING EXERCISES - Impingement Syndrome (Rotator Cuff Tendinitis, Bursitis) These exercises may help you when beginning to rehabilitate your injury. They may resolve your symptoms with or without further involvement from your physician, physical therapist or athletic trainer. While completing these exercises, remember:  Muscles can gain both the endurance and the strength needed for everyday activities through controlled exercises.  Complete these exercises as instructed by your physician, physical therapist or athletic trainer. Increase the resistance and repetitions only as guided.  You may  experience muscle soreness or fatigue, but the pain or discomfort you are trying to eliminate should never worsen during these exercises. If this pain does get worse, stop and make sure you are following the directions exactly. If the pain is still present after adjustments, discontinue the exercise until you can discuss the trouble with your clinician.  During your recovery, avoid activity or exercises which involve actions that place your injured hand or elbow above your head or behind your back or head. These positions stress the tissues which you are trying to heal. STRENGTH - Scapular Depression and Adduction   With good posture, sit on a firm chair. Support your arms in front of you, with pillows, arm rests, or on a table top. Have your elbows in line with the sides of your body.  Gently draw your shoulder blades down and toward your mid-back spine. Gradually increase the tension, without tensing the muscles along the top of your shoulders and the back of your neck.  Hold for __________ seconds. Slowly release the tension and relax your muscles completely before starting the next repetition.  After you have practiced this exercise, remove the arm support and complete the exercise in standing as well as sitting position. Repeat __________ times. Complete this exercise __________ times per day.  STRENGTH - Shoulder Abductors, Isometric  With good posture, stand or sit about 4-6 inches from a wall, with your right / left side facing the wall.  Bend your right / left elbow. Gently press your right / left elbow into the wall. Increase the pressure gradually, until you are pressing as hard as you can, without shrugging your shoulder or increasing any shoulder discomfort.  Hold for __________ seconds.  Release the tension slowly. Relax your shoulder muscles completely before you begin the next repetition. Repeat __________ times. Complete this exercise __________ times per day.  STRENGTH -  External Rotators, Isometric  Keep your right / left elbow at your side and bend it 90 degrees.  Step into a door frame so that the outside of your right / left wrist can press against the door frame without your upper arm leaving your side.  Gently press your right / left wrist into the door frame, as if you were trying to swing the back of your hand away from your stomach. Gradually increase the tension, until you are pressing as hard as you can, without shrugging your shoulder or increasing any shoulder discomfort.  Hold for __________ seconds.  Release the tension slowly. Relax your shoulder muscles completely before you begin  the next repetition. Repeat __________ times. Complete this exercise __________ times per day.  STRENGTH - Supraspinatus   Stand or sit with good posture. Grasp a __________ weight, or an exercise band or tubing, so that your hand is "thumbs-up," like you are shaking hands.  Slowly lift your right / left arm in a "V" away from your thigh, diagonally into the space between your side and straight ahead. Lift your hand to shoulder height or as far as you can, without increasing any shoulder pain. At first, many people do not lift their hands above shoulder height.  Avoid shrugging your right / left shoulder as your arm rises, by keeping your shoulder blade tucked down and toward your mid-back spine.  Hold for __________ seconds. Control the descent of your hand, as you slowly return to your starting position. Repeat __________ times. Complete this exercise __________ times per day.  STRENGTH - External Rotators  Secure a rubber exercise band or tubing to a fixed object (table, pole) so that it is at the same height as your right / left elbow when you are standing or sitting on a firm surface.  Stand or sit so that the secured exercise band is at your uninjured side.  Bend your right / left elbow 90 degrees. Place a folded towel or small pillow under your right /  left arm, so that your elbow is a few inches away from your side.  Keeping the tension on the exercise band, pull it away from your body, as if pivoting on your elbow. Be sure to keep your body steady, so that the movement is coming only from your rotating shoulder.  Hold for __________ seconds. Release the tension in a controlled manner, as you return to the starting position. Repeat __________ times. Complete this exercise __________ times per day.  STRENGTH - Internal Rotators   Secure a rubber exercise band or tubing to a fixed object (table, pole) so that it is at the same height as your right / left elbow when you are standing or sitting on a firm surface.  Stand or sit so that the secured exercise band is at your right / left side.  Bend your elbow 90 degrees. Place a folded towel or small pillow under your right / left arm so that your elbow is a few inches away from your side.  Keeping the tension on the exercise band, pull it across your body, toward your stomach. Be sure to keep your body steady, so that the movement is coming only from your rotating shoulder.  Hold for __________ seconds. Release the tension in a controlled manner, as you return to the starting position. Repeat __________ times. Complete this exercise __________ times per day.  STRENGTH - Scapular Protractors, Standing   Stand arms length away from a wall. Place your hands on the wall, keeping your elbows straight.  Begin by dropping your shoulder blades down and toward your mid-back spine.  To strengthen your protractors, keep your shoulder blades down, but slide them forward on your rib cage. It will feel as if you are lifting the back of your rib cage away from the wall. This is a subtle motion and can be challenging to complete. Ask your caregiver for further instruction, if you are not sure you are doing the exercise correctly.  Hold for __________ seconds. Slowly return to the starting position, resting  the muscles completely before starting the next repetition. Repeat __________ times. Complete this exercise __________ times per  day. STRENGTH - Scapular Protractors, Supine  Lie on your back on a firm surface. Extend your right / left arm straight into the air while holding a __________ weight in your hand.  Keeping your head and back in place, lift your shoulder off the floor.  Hold for __________ seconds. Slowly return to the starting position, and allow your muscles to relax completely before starting the next repetition. Repeat __________ times. Complete this exercise __________ times per day. STRENGTH - Scapular Protractors, Quadruped  Get onto your hands and knees, with your shoulders directly over your hands (or as close as you can be, comfortably).  Keeping your elbows locked, lift the back of your rib cage up into your shoulder blades, so your mid-back rounds out. Keep your neck muscles relaxed.  Hold this position for __________ seconds. Slowly return to the starting position and allow your muscles to relax completely before starting the next repetition. Repeat __________ times. Complete this exercise __________ times per day.  STRENGTH - Scapular Retractors  Secure a rubber exercise band or tubing to a fixed object (table, pole), so that it is at the height of your shoulders when you are either standing, or sitting on a firm armless chair.  With a palm down grip, grasp an end of the band in each hand. Straighten your elbows and lift your hands straight in front of you, at shoulder height. Step back, away from the secured end of the band, until it becomes tense.  Squeezing your shoulder blades together, draw your elbows back toward your sides, as you bend them. Keep your upper arms lifted away from your body throughout the exercise.  Hold for __________ seconds. Slowly ease the tension on the band, as you reverse the directions and return to the starting position. Repeat  __________ times. Complete this exercise __________ times per day. STRENGTH - Shoulder Extensors   Secure a rubber exercise band or tubing to a fixed object (table, pole) so that it is at the height of your shoulders when you are either standing, or sitting on a firm armless chair.  With a thumbs-up grip, grasp an end of the band in each hand. Straighten your elbows and lift your hands straight in front of you, at shoulder height. Step back, away from the secured end of the band, until it becomes tense.  Squeezing your shoulder blades together, pull your hands down to the sides of your thighs. Do not allow your hands to go behind you.  Hold for __________ seconds. Slowly ease the tension on the band, as you reverse the directions and return to the starting position. Repeat __________ times. Complete this exercise __________ times per day.  STRENGTH - Scapular Retractors and External Rotators   Secure a rubber exercise band or tubing to a fixed object (table, pole) so that it is at the height as your shoulders, when you are either standing, or sitting on a firm armless chair.  With a palm down grip, grasp an end of the band in each hand. Bend your elbows 90 degrees and lift your elbows to shoulder height, at your sides. Step back, away from the secured end of the band, until it becomes tense.  Squeezing your shoulder blades together, rotate your shoulders so that your upper arms and elbows remain stationary, but your fists travel upward to head height.  Hold for __________ seconds. Slowly ease the tension on the band, as you reverse the directions and return to the starting position. Repeat __________ times.  Complete this exercise __________ times per day.  STRENGTH - Scapular Retractors and External Rotators, Rowing   Secure a rubber exercise band or tubing to a fixed object (table, pole) so that it is at the height of your shoulders, when you are either standing, or sitting on a firm armless  chair.  With a palm down grip, grasp an end of the band in each hand. Straighten your elbows and lift your hands straight in front of you, at shoulder height. Step back, away from the secured end of the band, until it becomes tense.  Step 1: Squeeze your shoulder blades together. Bending your elbows, draw your hands to your chest, as if you are rowing a boat. At the end of this motion, your hands and elbow should be at shoulder height and your elbows should be out to your sides.  Step 2: Rotate your shoulders, to raise your hands above your head. Your forearms should be vertical and your upper arms should be horizontal.  Hold for __________ seconds. Slowly ease the tension on the band, as you reverse the directions and return to the starting position. Repeat __________ times. Complete this exercise __________ times per day.  STRENGTH - Scapular Depressors  Find a sturdy chair without wheels, such as a dining room chair.  Keeping your feet on the floor, and your hands on the chair arms, lift your bottom up from the seat, and lock your elbows.  Keeping your elbows straight, allow gravity to pull your body weight down. Your shoulders will rise toward your ears.  Raise your body against gravity by drawing your shoulder blades down your back, shortening the distance between your shoulders and ears. Although your feet should always maintain contact with the floor, your feet should progressively support less body weight, as you get stronger.  Hold for __________ seconds. In a controlled and slow manner, lower your body weight to begin the next repetition. Repeat __________ times. Complete this exercise __________ times per day.    This information is not intended to replace advice given to you by your health care provider. Make sure you discuss any questions you have with your health care provider.   Document Released: 03/17/2005 Document Revised: 04/07/2014 Document Reviewed: 06/29/2008 [employer] Patient Education 2016 [place] you healthy  Get these tests  Blood pressure- Have your blood pressure checked once a year by your healthcare provider.  Normal blood pressure is 120/80  Weight- Have your body mass index (BMI) calculated to screen for obesity.  BMI is a measure of body fat based on height and weight. You can also calculate your own BMI at [email or web address].  Cholesterol- Have your cholesterol checked every year.  Diabetes- Have your blood sugar checked regularly if you have high blood pressure, high cholesterol, have a family history of diabetes or if you are overweight.  Screening for Colon Cancer- Colonoscopy starting at age 86.  Screening may begin sooner depending on your family history and other health conditions. Follow up colonoscopy as directed by your Gastroenterologist.  Screening for Prostate Cancer- Both blood work (PSA) and a rectal exam help screen for Prostate Cancer.  Screening begins at age 45 with African-American men and at age 26 with Caucasian men.  Screening may begin sooner depending on your family history.  Take these medicines  Aspirin- One aspirin daily can help prevent Heart disease and Stroke.  Flu shot- Every fall.  Tetanus- Every 10 years.  Zostavax- Once after the  age of 77 to prevent Shingles.  Pneumonia shot- Once after the age of 83; if you are younger than 71, ask your healthcare provider if you need a Pneumonia shot.  Take these steps  Don't smoke- If you do smoke, talk to your doctor about quitting.  For tips on how to quit, go to www.smokefree.gov or call 1-800-QUIT-NOW.  Be physically active- Exercise 5 days a week for at least 30 minutes.  If you are not already physically active start slow and gradually work up to 30 minutes of moderate physical activity.  Examples of moderate activity include walking briskly, mowing the yard, dancing, swimming, bicycling, etc.  Eat a healthy diet- Eat a  variety of healthy food such as fruits, vegetables, low fat milk, low fat cheese, yogurt, lean meant, poultry, fish, beans, tofu, etc. For more information go to www.thenutritionsource.org  Drink alcohol in moderation- Limit alcohol intake to less than two drinks a day. Never drink and drive.  Dentist- Brush and floss twice daily; visit your dentist twice a year.  Depression- Your emotional health is as important as your physical health. If you're feeling down, or losing interest in things you would normally enjoy please talk to your healthcare provider.  Eye exam- Visit your eye doctor every year.  Safe sex- If you may be exposed to a sexually transmitted infection, use a condom.  Seat belts- Seat belts can save your life; always wear one.  Smoke/Carbon Monoxide detectors- These detectors need to be installed on the appropriate level of your home.  Replace batteries at least once a year.  Skin cancer- When out in the sun, cover up and use sunscreen 15 SPF or higher.  Violence- If anyone is threatening you, please tell your healthcare provider.  Living Will/ Health care power of attorney- Speak with your healthcare provider and family.      I personally performed the services described in this documentation, which was scribed in my presence. The recorded information has been reviewed and considered, and addended by me as needed.

## 2015-04-30 NOTE — Patient Instructions (Addendum)
Your diabetes test is higher today.  We will refer you again to diabetic classes, but cut back on sugar containing beverages and sweets as much as possible for now. Repeat sugar test in 3 months, and if still elevated, may need to start medicine at that time. Let me know if you have questions in the meantime.  Follow up as planned with your eye care provider, but let them know you do have diabetes.   You should receive a call or letter about your lab results within the next week to 10 days.   See information and exercises for your shoulder below, and follow up if this is not improving.  Stop advil or alleve - only tylenol over the counter due to your stomach issues previously.   Return to the clinic or go to the nearest emergency room if any of your symptoms worsen or new symptoms occur.   Diabetes Mellitus and Food It is important for you to manage your blood sugar (glucose) level. Your blood glucose level can be greatly affected by what you eat. Eating healthier foods in the appropriate amounts throughout the day at about the same time each day will help you control your blood glucose level. It can also help slow or prevent worsening of your diabetes mellitus. Healthy eating may even help you improve the level of your blood pressure and reach or maintain a healthy weight.  General recommendations for healthful eating and cooking habits include:  Eating meals and snacks regularly. Avoid going long periods of time without eating to lose weight.  Eating a diet that consists mainly of plant-based foods, such as fruits, vegetables, nuts, legumes, and whole grains.  Using low-heat cooking methods, such as baking, instead of high-heat cooking methods, such as deep frying. Work with your dietitian to make sure you understand how to use the Nutrition Facts information on food labels. HOW CAN FOOD AFFECT ME? Carbohydrates Carbohydrates affect your blood glucose level more than any other type of food.  Your dietitian will help you determine how many carbohydrates to eat at each meal and teach you how to count carbohydrates. Counting carbohydrates is important to keep your blood glucose at a healthy level, especially if you are using insulin or taking certain medicines for diabetes mellitus. Alcohol Alcohol can cause sudden decreases in blood glucose (hypoglycemia), especially if you use insulin or take certain medicines for diabetes mellitus. Hypoglycemia can be a life-threatening condition. Symptoms of hypoglycemia (sleepiness, dizziness, and disorientation) are similar to symptoms of having too much alcohol.  If your health care provider has given you approval to drink alcohol, do so in moderation and use the following guidelines:  Women should not have more than one drink per day, and men should not have more than two drinks per day. One drink is equal to:  12 oz of beer.  5 oz of wine.  1 oz of hard liquor.  Do not drink on an empty stomach.  Keep yourself hydrated. Have water, diet soda, or unsweetened iced tea.  Regular soda, juice, and other mixers might contain a lot of carbohydrates and should be counted. WHAT FOODS ARE NOT RECOMMENDED? As you make food choices, it is important to remember that all foods are not the same. Some foods have fewer nutrients per serving than other foods, even though they might have the same number of calories or carbohydrates. It is difficult to get your body what it needs when you eat foods with fewer nutrients. Examples of foods  that you should avoid that are high in calories and carbohydrates but low in nutrients include:  Trans fats (most processed foods list trans fats on the Nutrition Facts label).  Regular soda.  Juice.  Candy.  Sweets, such as cake, pie, doughnuts, and cookies.  Fried foods. WHAT FOODS CAN I EAT? Eat nutrient-rich foods, which will nourish your body and keep you healthy. The food you should eat also will depend on  several factors, including:  The calories you need.  The medicines you take.  Your weight.  Your blood glucose level.  Your blood pressure level.  Your cholesterol level. You should eat a variety of foods, including:  Protein.  Lean cuts of meat.  Proteins low in saturated fats, such as fish, egg whites, and beans. Avoid processed meats.  Fruits and vegetables.  Fruits and vegetables that may help control blood glucose levels, such as apples, mangoes, and yams.  Dairy products.  Choose fat-free or low-fat dairy products, such as milk, yogurt, and cheese.  Grains, bread, pasta, and rice.  Choose whole grain products, such as multigrain bread, whole oats, and brown rice. These foods may help control blood pressure.  Fats.  Foods containing healthful fats, such as nuts, avocado, olive oil, canola oil, and fish. DOES EVERYONE WITH DIABETES MELLITUS HAVE THE SAME MEAL PLAN? Because every person with diabetes mellitus is different, there is not one meal plan that works for everyone. It is very important that you meet with a dietitian who will help you create a meal plan that is just right for you.   This information is not intended to replace advice given to you by your health care provider. Make sure you discuss any questions you have with your health care provider.   Document Released: 12/12/2004 Document Revised: 04/07/2014 Document Reviewed: 02/11/2013 [employer] Interactive Patient Education 2016 [employer]  Impingement Syndrome, Rotator Cuff, Bursitis With Rehab Impingement syndrome is a condition that involves inflammation of the tendons of the rotator cuff and the subacromial bursa, that causes pain in the shoulder. The rotator cuff consists of four tendons and muscles that control much of the shoulder and upper arm function. The subacromial bursa is a fluid filled sac that helps reduce friction between the rotator cuff and one of the bones of the shoulder (acromion).  Impingement syndrome is usually an overuse injury that causes swelling of the bursa (bursitis), swelling of the tendon (tendonitis), and/or a tear of the tendon (strain). Strains are classified into three categories. Grade 1 strains cause pain, but the tendon is not lengthened. Grade 2 strains include a lengthened ligament, due to the ligament being stretched or partially ruptured. With grade 2 strains there is still function, although the function may be decreased. Grade 3 strains include a complete tear of the tendon or muscle, and function is usually impaired. SYMPTOMS   Pain around the shoulder, often at the outer portion of the upper arm.  Pain that gets worse with shoulder function, especially when reaching overhead or lifting.  Sometimes, aching when not using the arm.  Pain that wakes you up at night.  Sometimes, tenderness, swelling, warmth, or redness over the affected area.  Loss of strength.  Limited motion of the shoulder, especially reaching behind the back (to the back pocket or to unhook bra) or across your body.  Crackling sound (crepitation) when moving the arm.  Biceps tendon pain and inflammation (in the front of the shoulder). Worse when bending the elbow or lifting. CAUSES  Impingement syndrome is often an overuse injury, in which chronic (repetitive) motions cause the tendons or bursa to become inflamed. A strain occurs when a force is paced on the tendon or muscle that is greater than it can withstand. Common mechanisms of injury include: Stress from sudden increase in duration, frequency, or intensity of training.  Direct hit (trauma) to the shoulder.  Aging, erosion of the tendon with normal use.  Bony bump on shoulder (acromial spur). RISK INCREASES WITH:  Contact sports (football, wrestling, boxing).  Throwing sports (baseball, tennis, volleyball).  Weightlifting and bodybuilding.  Heavy labor.  Previous injury to the rotator cuff, including  impingement.  Poor shoulder strength and flexibility.  Failure to warm up properly before activity.  Inadequate protective equipment.  Old age.  Bony bump on shoulder (acromial spur). PREVENTION   Warm up and stretch properly before activity.  Allow for adequate recovery between workouts.  Maintain physical fitness:  Strength, flexibility, and endurance.  Cardiovascular fitness.  Learn and use proper exercise technique. PROGNOSIS  If treated properly, impingement syndrome usually goes away within 6 weeks. Sometimes surgery is required.  RELATED COMPLICATIONS   Longer healing time if not properly treated, or if not given enough time to heal.  Recurring symptoms, that result in a chronic condition.  Shoulder stiffness, frozen shoulder, or loss of motion.  Rotator cuff tendon tear.  Recurring symptoms, especially if activity is resumed too soon, with overuse, with a direct blow, or when using poor technique. TREATMENT  Treatment first involves the use of ice and medicine, to reduce pain and inflammation. The use of strengthening and stretching exercises may help reduce pain with activity. These exercises may be performed at home or with a therapist. If non-surgical treatment is unsuccessful after more than 6 months, surgery may be advised. After surgery and rehabilitation, activity is usually possible in 3 months.  MEDICATION  If pain medicine is needed, nonsteroidal anti-inflammatory medicines (aspirin and ibuprofen), or other minor pain relievers (acetaminophen), are often advised.  Do not take pain medicine for 7 days before surgery.  Prescription pain relievers may be given, if your caregiver thinks they are needed. Use only as directed and only as much as you need.  Corticosteroid injections may be given by your caregiver. These injections should be reserved for the most serious cases, because they may only be given a certain number of times. HEAT AND COLD  Cold  treatment (icing) should be applied for 10 to 15 minutes every 2 to 3 hours for inflammation and pain, and immediately after activity that aggravates your symptoms. Use ice packs or an ice massage.  Heat treatment may be used before performing stretching and strengthening activities prescribed by your caregiver, physical therapist, or athletic trainer. Use a heat pack or a warm water soak. SEEK MEDICAL CARE IF:   Symptoms get worse or do not improve in 4 to 6 weeks, despite treatment.  New, unexplained symptoms develop. (Drugs used in treatment may produce side effects.) EXERCISES  RANGE OF MOTION (ROM) AND STRETCHING EXERCISES - Impingement Syndrome (Rotator Cuff  Tendinitis, Bursitis) These exercises may help you when beginning to rehabilitate your injury. Your symptoms may go away with or without further involvement from your physician, physical therapist or athletic trainer. While completing these exercises, remember:   Restoring tissue flexibility helps normal motion to return to the joints. This allows healthier, less painful movement and activity.  An effective stretch should be held for at least 30 seconds.  A stretch  should never be painful. You should only feel a gentle lengthening or release in the stretched tissue. STRETCH - Flexion, Standing  Stand with good posture. With an underhand grip on your right / left hand, and an overhand grip on the opposite hand, grasp a broomstick or cane so that your hands are a little more than shoulder width apart.  Keeping your right / left elbow straight and shoulder muscles relaxed, push the stick with your opposite hand, to raise your right / left arm in front of your body and then overhead. Raise your arm until you feel a stretch in your right / left shoulder, but before you have increased shoulder pain.  Try to avoid shrugging your right / left shoulder as your arm rises, by keeping your shoulder blade tucked down and toward your mid-back  spine. Hold for __________ seconds.  Slowly return to the starting position. Repeat __________ times. Complete this exercise __________ times per day. STRETCH - Abduction, Supine  Lie on your back. With an underhand grip on your right / left hand and an overhand grip on the opposite hand, grasp a broomstick or cane so that your hands are a little more than shoulder width apart.  Keeping your right / left elbow straight and your shoulder muscles relaxed, push the stick with your opposite hand, to raise your right / left arm out to the side of your body and then overhead. Raise your arm until you feel a stretch in your right / left shoulder, but before you have increased shoulder pain.  Try to avoid shrugging your right / left shoulder as your arm rises, by keeping your shoulder blade tucked down and toward your mid-back spine. Hold for __________ seconds.  Slowly return to the starting position. Repeat __________ times. Complete this exercise __________ times per day. ROM - Flexion, Active-Assisted  Lie on your back. You may bend your knees for comfort.  Grasp a broomstick or cane so your hands are about shoulder width apart. Your right / left hand should grip the end of the stick, so that your hand is positioned "thumbs-up," as if you were about to shake hands.  Using your healthy arm to lead, raise your right / left arm overhead, until you feel a gentle stretch in your shoulder. Hold for __________ seconds.  Use the stick to assist in returning your right / left arm to its starting position. Repeat __________ times. Complete this exercise __________ times per day.  ROM - Internal Rotation, Supine   Lie on your back on a firm surface. Place your right / left elbow about 60 degrees away from your side. Elevate your elbow with a folded towel, so that the elbow and shoulder are the same height.  Using a broomstick or cane and your strong arm, pull your right / left hand toward your body  until you feel a gentle stretch, but no increase in your shoulder pain. Keep your shoulder and elbow in place throughout the exercise.  Hold for __________ seconds. Slowly return to the starting position. Repeat __________ times. Complete this exercise __________ times per day. STRETCH - Internal Rotation  Place your right / left hand behind your back, palm up.  Throw a towel or belt over your opposite shoulder. Grasp the towel with your right / left hand.  While keeping an upright posture, gently pull up on the towel, until you feel a stretch in the front of your right / left shoulder.  Avoid shrugging your right /  left shoulder as your arm rises, by keeping your shoulder blade tucked down and toward your mid-back spine.  Hold for __________ seconds. Release the stretch, by lowering your healthy hand. Repeat __________ times. Complete this exercise __________ times per day. ROM - Internal Rotation   Using an underhand grip, grasp a stick behind your back with both hands.  While standing upright with good posture, slide the stick up your back until you feel a mild stretch in the front of your shoulder.  Hold for __________ seconds. Slowly return to your starting position. Repeat __________ times. Complete this exercise __________ times per day.  STRETCH - Posterior Shoulder Capsule   Stand or sit with good posture. Grasp your right / left elbow and draw it across your chest, keeping it at the same height as your shoulder.  Pull your elbow, so your upper arm comes in closer to your chest. Pull until you feel a gentle stretch in the back of your shoulder.  Hold for __________ seconds. Repeat __________ times. Complete this exercise __________ times per day. STRENGTHENING EXERCISES - Impingement Syndrome (Rotator Cuff Tendinitis, Bursitis) These exercises may help you when beginning to rehabilitate your injury. They may resolve your symptoms with or without further involvement from your  physician, physical therapist or athletic trainer. While completing these exercises, remember:  Muscles can gain both the endurance and the strength needed for everyday activities through controlled exercises.  Complete these exercises as instructed by your physician, physical therapist or athletic trainer. Increase the resistance and repetitions only as guided.  You may experience muscle soreness or fatigue, but the pain or discomfort you are trying to eliminate should never worsen during these exercises. If this pain does get worse, stop and make sure you are following the directions exactly. If the pain is still present after adjustments, discontinue the exercise until you can discuss the trouble with your clinician.  During your recovery, avoid activity or exercises which involve actions that place your injured hand or elbow above your head or behind your back or head. These positions stress the tissues which you are trying to heal. STRENGTH - Scapular Depression and Adduction   With good posture, sit on a firm chair. Support your arms in front of you, with pillows, arm rests, or on a table top. Have your elbows in line with the sides of your body.  Gently draw your shoulder blades down and toward your mid-back spine. Gradually increase the tension, without tensing the muscles along the top of your shoulders and the back of your neck.  Hold for __________ seconds. Slowly release the tension and relax your muscles completely before starting the next repetition.  After you have practiced this exercise, remove the arm support and complete the exercise in standing as well as sitting position. Repeat __________ times. Complete this exercise __________ times per day.  STRENGTH - Shoulder Abductors, Isometric  With good posture, stand or sit about 4-6 inches from a wall, with your right / left side facing the wall.  Bend your right / left elbow. Gently press your right / left elbow into the wall.  Increase the pressure gradually, until you are pressing as hard as you can, without shrugging your shoulder or increasing any shoulder discomfort.  Hold for __________ seconds.  Release the tension slowly. Relax your shoulder muscles completely before you begin the next repetition. Repeat __________ times. Complete this exercise __________ times per day.  STRENGTH - External Rotators, Isometric  Keep your right /  left elbow at your side and bend it 90 degrees.  Step into a door frame so that the outside of your right / left wrist can press against the door frame without your upper arm leaving your side.  Gently press your right / left wrist into the door frame, as if you were trying to swing the back of your hand away from your stomach. Gradually increase the tension, until you are pressing as hard as you can, without shrugging your shoulder or increasing any shoulder discomfort.  Hold for __________ seconds.  Release the tension slowly. Relax your shoulder muscles completely before you begin the next repetition. Repeat __________ times. Complete this exercise __________ times per day.  STRENGTH - Supraspinatus   Stand or sit with good posture. Grasp a __________ weight, or an exercise band or tubing, so that your hand is "thumbs-up," like you are shaking hands.  Slowly lift your right / left arm in a "V" away from your thigh, diagonally into the space between your side and straight ahead. Lift your hand to shoulder height or as far as you can, without increasing any shoulder pain. At first, many people do not lift their hands above shoulder height.  Avoid shrugging your right / left shoulder as your arm rises, by keeping your shoulder blade tucked down and toward your mid-back spine.  Hold for __________ seconds. Control the descent of your hand, as you slowly return to your starting position. Repeat __________ times. Complete this exercise __________ times per day.  STRENGTH - External  Rotators  Secure a rubber exercise band or tubing to a fixed object (table, pole) so that it is at the same height as your right / left elbow when you are standing or sitting on a firm surface.  Stand or sit so that the secured exercise band is at your uninjured side.  Bend your right / left elbow 90 degrees. Place a folded towel or small pillow under your right / left arm, so that your elbow is a few inches away from your side.  Keeping the tension on the exercise band, pull it away from your body, as if pivoting on your elbow. Be sure to keep your body steady, so that the movement is coming only from your rotating shoulder.  Hold for __________ seconds. Release the tension in a controlled manner, as you return to the starting position. Repeat __________ times. Complete this exercise __________ times per day.  STRENGTH - Internal Rotators   Secure a rubber exercise band or tubing to a fixed object (table, pole) so that it is at the same height as your right / left elbow when you are standing or sitting on a firm surface.  Stand or sit so that the secured exercise band is at your right / left side.  Bend your elbow 90 degrees. Place a folded towel or small pillow under your right / left arm so that your elbow is a few inches away from your side.  Keeping the tension on the exercise band, pull it across your body, toward your stomach. Be sure to keep your body steady, so that the movement is coming only from your rotating shoulder.  Hold for __________ seconds. Release the tension in a controlled manner, as you return to the starting position. Repeat __________ times. Complete this exercise __________ times per day.  STRENGTH - Scapular Protractors, Standing   Stand arms length away from a wall. Place your hands on the wall, keeping your elbows straight.  Begin by dropping your shoulder blades down and toward your mid-back spine.  To strengthen your protractors, keep your shoulder blades  down, but slide them forward on your rib cage. It will feel as if you are lifting the back of your rib cage away from the wall. This is a subtle motion and can be challenging to complete. Ask your caregiver for further instruction, if you are not sure you are doing the exercise correctly.  Hold for __________ seconds. Slowly return to the starting position, resting the muscles completely before starting the next repetition. Repeat __________ times. Complete this exercise __________ times per day. STRENGTH - Scapular Protractors, Supine  Lie on your back on a firm surface. Extend your right / left arm straight into the air while holding a __________ weight in your hand.  Keeping your head and back in place, lift your shoulder off the floor.  Hold for __________ seconds. Slowly return to the starting position, and allow your muscles to relax completely before starting the next repetition. Repeat __________ times. Complete this exercise __________ times per day. STRENGTH - Scapular Protractors, Quadruped  Get onto your hands and knees, with your shoulders directly over your hands (or as close as you can be, comfortably).  Keeping your elbows locked, lift the back of your rib cage up into your shoulder blades, so your mid-back rounds out. Keep your neck muscles relaxed.  Hold this position for __________ seconds. Slowly return to the starting position and allow your muscles to relax completely before starting the next repetition. Repeat __________ times. Complete this exercise __________ times per day.  STRENGTH - Scapular Retractors  Secure a rubber exercise band or tubing to a fixed object (table, pole), so that it is at the height of your shoulders when you are either standing, or sitting on a firm armless chair.  With a palm down grip, grasp an end of the band in each hand. Straighten your elbows and lift your hands straight in front of you, at shoulder height. Step back, away from the  secured end of the band, until it becomes tense.  Squeezing your shoulder blades together, draw your elbows back toward your sides, as you bend them. Keep your upper arms lifted away from your body throughout the exercise.  Hold for __________ seconds. Slowly ease the tension on the band, as you reverse the directions and return to the starting position. Repeat __________ times. Complete this exercise __________ times per day. STRENGTH - Shoulder Extensors   Secure a rubber exercise band or tubing to a fixed object (table, pole) so that it is at the height of your shoulders when you are either standing, or sitting on a firm armless chair.  With a thumbs-up grip, grasp an end of the band in each hand. Straighten your elbows and lift your hands straight in front of you, at shoulder height. Step back, away from the secured end of the band, until it becomes tense.  Squeezing your shoulder blades together, pull your hands down to the sides of your thighs. Do not allow your hands to go behind you.  Hold for __________ seconds. Slowly ease the tension on the band, as you reverse the directions and return to the starting position. Repeat __________ times. Complete this exercise __________ times per day.  STRENGTH - Scapular Retractors and External Rotators   Secure a rubber exercise band or tubing to a fixed object (table, pole) so that it is at the height as your shoulders, when you  are either standing, or sitting on a firm armless chair.  With a palm down grip, grasp an end of the band in each hand. Bend your elbows 90 degrees and lift your elbows to shoulder height, at your sides. Step back, away from the secured end of the band, until it becomes tense.  Squeezing your shoulder blades together, rotate your shoulders so that your upper arms and elbows remain stationary, but your fists travel upward to head height.  Hold for __________ seconds. Slowly ease the tension on the band, as you reverse the  directions and return to the starting position. Repeat __________ times. Complete this exercise __________ times per day.  STRENGTH - Scapular Retractors and External Rotators, Rowing   Secure a rubber exercise band or tubing to a fixed object (table, pole) so that it is at the height of your shoulders, when you are either standing, or sitting on a firm armless chair.  With a palm down grip, grasp an end of the band in each hand. Straighten your elbows and lift your hands straight in front of you, at shoulder height. Step back, away from the secured end of the band, until it becomes tense.  Step 1: Squeeze your shoulder blades together. Bending your elbows, draw your hands to your chest, as if you are rowing a boat. At the end of this motion, your hands and elbow should be at shoulder height and your elbows should be out to your sides.  Step 2: Rotate your shoulders, to raise your hands above your head. Your forearms should be vertical and your upper arms should be horizontal.  Hold for __________ seconds. Slowly ease the tension on the band, as you reverse the directions and return to the starting position. Repeat __________ times. Complete this exercise __________ times per day.  STRENGTH - Scapular Depressors  Find a sturdy chair without wheels, such as a dining room chair.  Keeping your feet on the floor, and your hands on the chair arms, lift your bottom up from the seat, and lock your elbows.  Keeping your elbows straight, allow gravity to pull your body weight down. Your shoulders will rise toward your ears.  Raise your body against gravity by drawing your shoulder blades down your back, shortening the distance between your shoulders and ears. Although your feet should always maintain contact with the floor, your feet should progressively support less body weight, as you get stronger.  Hold for __________ seconds. In a controlled and slow manner, lower your body weight to begin the  next repetition. Repeat __________ times. Complete this exercise __________ times per day.    This information is not intended to replace advice given to you by your health care provider. Make sure you discuss any questions you have with your health care provider.   Document Released: 03/17/2005 Document Revised: 04/07/2014 Document Reviewed: 06/29/2008 Elsevier Interactive Patient Education 2016 [place] you healthy  Get these tests  Blood pressure- Have your blood pressure checked once a year by your healthcare provider.  Normal blood pressure is 120/80  Weight- Have your body mass index (BMI) calculated to screen for obesity.  BMI is a measure of body fat based on height and weight. You can also calculate your own BMI at ViewBanking.si.  Cholesterol- Have your cholesterol checked every year.  Diabetes- Have your blood sugar checked regularly if you have high blood pressure, high cholesterol, have a family history of diabetes or if you are overweight.  Screening for Colon Cancer- Colonoscopy starting at age 2.  Screening may begin sooner depending on your family history and other health conditions. Follow up colonoscopy as directed by your Gastroenterologist.  Screening for Prostate Cancer- Both blood work (PSA) and a rectal exam help screen for Prostate Cancer.  Screening begins at age 52 with African-American men and at age 79 with Caucasian men.  Screening may begin sooner depending on your family history.  Take these medicines  Aspirin- One aspirin daily can help prevent Heart disease and Stroke.  Flu shot- Every fall.  Tetanus- Every 10 years.  Zostavax- Once after the age of 51 to prevent Shingles.  Pneumonia shot- Once after the age of 19; if you are younger than 7, ask your healthcare provider if you need a Pneumonia shot.  Take these steps  Don't smoke- If you do smoke, talk to your doctor about quitting.  For tips on how to quit, go to  www.smokefree.gov or call 1-800-QUIT-NOW.  Be physically active- Exercise 5 days a week for at least 30 minutes.  If you are not already physically active start slow and gradually work up to 30 minutes of moderate physical activity.  Examples of moderate activity include walking briskly, mowing the yard, dancing, swimming, bicycling, etc.  Eat a healthy diet- Eat a variety of healthy food such as fruits, vegetables, low fat milk, low fat cheese, yogurt, lean meant, poultry, fish, beans, tofu, etc. For more information go to www.thenutritionsource.org  Drink alcohol in moderation- Limit alcohol intake to less than two drinks a day. Never drink and drive.  Dentist- Brush and floss twice daily; visit your dentist twice a year.  Depression- Your emotional health is as important as your physical health. If you're feeling down, or losing interest in things you would normally enjoy please talk to your healthcare provider.  Eye exam- Visit your eye doctor every year.  Safe sex- If you may be exposed to a sexually transmitted infection, use a condom.  Seat belts- Seat belts can save your life; always wear one.  Smoke/Carbon Monoxide detectors- These detectors need to be installed on the appropriate level of your home.  Replace batteries at least once a year.  Skin cancer- When out in the sun, cover up and use sunscreen 15 SPF or higher.  Violence- If anyone is threatening you, please tell your healthcare provider.  Living Will/ Health care power of attorney- Speak with your healthcare provider and family.

## 2015-05-02 LAB — LIPID PANEL
Cholesterol: 121 mg/dL — ABNORMAL LOW (ref 125–200)
HDL: 39 mg/dL — ABNORMAL LOW (ref >=40)
LDL CALC: 55 mg/dL (ref <130)
Total CHOL/HDL Ratio: 3.1 Ratio (ref <=5.0)
Triglycerides: 133 mg/dL (ref <150)
VLDL: 27 mg/dL (ref <30)

## 2015-05-02 LAB — COMPLETE METABOLIC PANEL WITH GFR
ALT: 27 U/L (ref 9–46)
AST: 23 U/L (ref 10–35)
Albumin: 4 g/dL (ref 3.6–5.1)
Alkaline Phosphatase: 53 U/L (ref 40–115)
BUN: 9 mg/dL (ref 7–25)
CHLORIDE: 104 mmol/L (ref 98–110)
CO2: 31 mmol/L (ref 20–31)
CREATININE: 0.97 mg/dL (ref 0.70–1.18)
Calcium: 9.7 mg/dL (ref 8.6–10.3)
GFR, Est African American: 87 mL/min (ref >=60)
GFR, Est Non African American: 75 mL/min (ref >=60)
Glucose, Bld: 149 mg/dL — ABNORMAL HIGH (ref 65–99)
Potassium: 4.7 mmol/L (ref 3.5–5.3)
Sodium: 141 mmol/L (ref 135–146)
Total Bilirubin: 0.6 mg/dL (ref 0.2–1.2)
Total Protein: 6.8 g/dL (ref 6.1–8.1)

## 2015-05-02 LAB — MICROALBUMIN, URINE: MICROALB UR: 0.6 mg/dL

## 2015-05-03 ENCOUNTER — Encounter: Payer: Self-pay | Admitting: Family Medicine

## 2015-05-07 ENCOUNTER — Encounter: Payer: Self-pay | Admitting: Gastroenterology

## 2015-05-07 ENCOUNTER — Other Ambulatory Visit: Payer: Self-pay

## 2015-05-07 MED ORDER — OMEPRAZOLE 20 MG PO CPDR: 20.0000 mg | DELAYED_RELEASE_CAPSULE | Freq: Every day | ORAL | Status: DC

## 2015-05-08 ENCOUNTER — Other Ambulatory Visit: Payer: Self-pay

## 2015-05-08 MED ORDER — FLUTICASONE PROPIONATE 50 MCG/ACT NA SUSP: NASAL | Status: DC

## 2015-05-26 ENCOUNTER — Encounter: Payer: Self-pay | Admitting: *Deleted

## 2015-06-22 ENCOUNTER — Ambulatory Visit: Payer: Medicare Other | Admitting: Dietician

## 2015-07-05 DIAGNOSIS — H16223 Keratoconjunctivitis sicca, not specified as Sjogren's, bilateral: Secondary | ICD-10-CM | POA: Diagnosis not present

## 2015-07-05 DIAGNOSIS — H40021 Open angle with borderline findings, high risk, right eye: Secondary | ICD-10-CM | POA: Diagnosis not present

## 2015-07-05 DIAGNOSIS — H01009 Unspecified blepharitis unspecified eye, unspecified eyelid: Secondary | ICD-10-CM | POA: Diagnosis not present

## 2015-07-05 DIAGNOSIS — H01003 Unspecified blepharitis right eye, unspecified eyelid: Secondary | ICD-10-CM | POA: Diagnosis not present

## 2015-07-20 ENCOUNTER — Encounter: Payer: Medicare Other | Attending: Family Medicine | Admitting: Dietician

## 2015-07-20 ENCOUNTER — Encounter: Payer: Self-pay | Admitting: Dietician

## 2015-07-20 VITALS — Ht 72.0 in | Wt 205.0 lb

## 2015-07-20 DIAGNOSIS — E119 Type 2 diabetes mellitus without complications: Secondary | ICD-10-CM | POA: Insufficient documentation

## 2015-07-20 NOTE — Progress Notes (Signed)
Diabetes Self-Management Education  Visit Type: First/Initial  Appt. Start Time: 0925 Appt. End Time: 1030  07/20/2015  Mr. Douglas [name], identified by name and date of birth, is a 80 y.o. male with a diagnosis of Diabetes: Type 2.  Other hx includes GERD, hyperlipidemia, and low vitamin D.  Patient with newly diagnosed type 2 diabetes diagnosed in January with HgbA1C of 7.7%.  He has omitted soda since that time and added sugar.  His diet is overall healthy.  He has noted an increased sweet craving since omitting soda with prior intake of 36 ounces daily.  He is lactose intolerant and avoids most milk, cheese, ice cream.  Patient lives with wife.  She shops and cooks.  They rarely eat out.  He is retired.     ASSESSMENT  Height 6' (1.829 m), weight 205 lb (92.987 kg). Body mass index is 27.8 kg/(m^2).      Diabetes Self-Management Education - 07/20/15 0932    Visit Information   Visit Type First/Initial   Initial Visit   Diabetes Type Type 2   Are you currently following a meal plan? No   Are you taking your medications as prescribed? Not on Medications   Date Diagnosed 04/30/15   Health Coping   How would you rate your overall health? Excellent   Psychosocial Assessment   Patient Belief/Attitude about Diabetes Denial   Self-care barriers None   Self-management support Doctor's office;Family   Other persons present Patient;Spouse/SO   Patient Concerns Nutrition/Meal planning;Glycemic Control   Special Needs None   Preferred Learning Style No preference indicated   Learning Readiness Ready   How often do you need to have someone help you when you read instructions, pamphlets, or other written materials from your doctor or pharmacy? 1 - Never   What is the last grade level you completed in school? 2 years college   Complications   Last HgB A1C per patient/outside source 7.7 %  04/30/15   How often do you check your blood sugar? Not recommended by provider   Have you had a  dilated eye exam in the past 12 months? Yes   Have you had a dental exam in the past 12 months? Yes   Are you checking your feet? No   Dietary Intake   Breakfast regular oatmeal with walnuts, raisins, equal and coffee with equal   9:30   Snack (morning) none   Lunch [place] sandwich on [employer]   1:30-2   Snack (afternoon) almonds and >8 gingersnaps   Dinner Meat, starch, vegetables/salad (rare fried)  6   Snack (evening) cornflakes with lactaid milk   Beverage(s) water, coffee with equal, unsweetened tea with equal   Exercise   Exercise Type Light (walking / raking leaves)   How many days per week to you exercise? 7   How many minutes per day do you exercise? 60   Total minutes per week of exercise 420   Patient Education   Previous Diabetes Education No   Disease state  Definition of diabetes, type 1 and 2, and the diagnosis of diabetes;Explored patient's options for treatment of their diabetes;Factors that contribute to the development of diabetes   Nutrition management  Role of diet in the treatment of diabetes and the relationship between the three main macronutrients and blood glucose level;Food label reading, portion sizes and measuring food.;Meal options for control of blood glucose level and chronic complications.   Physical activity and exercise  Role of exercise on diabetes management,  blood pressure control and cardiac health.   Monitoring Identified appropriate SMBG and/or A1C goals.;Yearly dilated eye exam;Daily foot exams   Chronic complications Relationship between chronic complications and blood glucose control;Lipid levels, blood glucose control and heart disease;Dental care;Retinopathy and reason for yearly dilated eye exams   Psychosocial adjustment Role of stress on diabetes   Individualized Goals (developed by patient)   Physical Activity Exercise 5-7 days per week;60 minutes per day   Medications Not Applicable   Monitoring  Not Applicable   Outcomes   Expected  Outcomes Demonstrated interest in learning. Expect positive outcomes   Future DMSE PRN   Program Status Completed      Individualized Plan for Diabetes Self-Management Training:   Learning Objective:  Patient will have a greater understanding of diabetes self-management. Patient education plan is to attend individual and/or group sessions per assessed needs and concerns.   Plan:   Patient Instructions  Plan:  Aim for 3 Carb Choices per meal (45 grams) +/- 1 either way  Aim for 0-1 Carbs per snack if hungry  Include protein in moderation with your meals and snacks Consider reading food labels for Total Carbohydrate and Fat Grams of foods Consider  increasing your activity level by walking for 60 minutes daily as tolerated        Expected Outcomes:  Demonstrated interest in learning. Expect positive outcomes  Education material provided: Living Well with Diabetes, Food label handouts, A1C conversion sheet, Meal plan card, My Plate and Snack sheet  If problems or questions, patient to contact team via:  Phone and Email  Future DSME appointment: PRN

## 2015-07-20 NOTE — Patient Instructions (Signed)
Plan:  Aim for 3 Carb Choices per meal (45 grams) +/- 1 either way  Aim for 0-1 Carbs per snack if hungry  Include protein in moderation with your meals and snacks Consider reading food labels for Total Carbohydrate and Fat Grams of foods Consider  increasing your activity level by walking for 60 minutes daily as tolerated

## 2015-07-24 ENCOUNTER — Other Ambulatory Visit: Payer: Self-pay | Admitting: Urgent Care

## 2015-07-25 ENCOUNTER — Encounter: Payer: Self-pay | Admitting: Family Medicine

## 2015-07-25 ENCOUNTER — Ambulatory Visit (INDEPENDENT_AMBULATORY_CARE_PROVIDER_SITE_OTHER): Payer: Medicare Other | Admitting: Family Medicine

## 2015-07-25 VITALS — BP 128/60 | HR 86 | Temp 98.3°F | Resp 16 | Ht 71.25 in | Wt 202.2 lb

## 2015-07-25 DIAGNOSIS — E785 Hyperlipidemia, unspecified: Secondary | ICD-10-CM | POA: Diagnosis not present

## 2015-07-25 DIAGNOSIS — E119 Type 2 diabetes mellitus without complications: Secondary | ICD-10-CM

## 2015-07-25 LAB — GLUCOSE, POCT (MANUAL RESULT ENTRY): POC Glucose: 133 mg/dl — AB (ref 70–99)

## 2015-07-25 LAB — POCT GLYCOSYLATED HEMOGLOBIN (HGB A1C): Hemoglobin A1C: 6.8

## 2015-07-25 MED ORDER — SIMVASTATIN 20 MG PO TABS: 20.0000 mg | ORAL_TABLET | Freq: Every day | ORAL | Status: DC

## 2015-07-25 NOTE — Patient Instructions (Addendum)
     IF you received an x-ray today, you will receive an invoice from [facility]. Please contact [facility] at [phone] with questions or concerns regarding your invoice.   IF you received labwork today, you will receive an invoice from [employer]. Please contact [facility] at [phone] with questions or concerns regarding your invoice.   Our billing staff will not be able to assist you with questions regarding bills from these companies.  You will be contacted with the lab results as soon as they are available. The fastest way to get your results is to activate your My Chart account. Instructions are located on the last page of this paperwork. If you have not heard from [place] regarding the results in 2 weeks, please contact this office.     We can try lower dose of simvastatin for cholesterol as so well controlled recently. recheck cholesterol in next 3-6 months.   Continue to watch diet and avoid sugar containing beverages. Continue exercise most if not all days of the week.   Return in 3 months for repeat blood sugar check. No medications needed at this time.  If still controlled next visit - may be able to space out visit to 6 months.   Return to the clinic or go to the nearest emergency room if any of your symptoms worsen or new symptoms occur.

## 2015-07-25 NOTE — Progress Notes (Signed)
Subjective:  By signing my name below, I, [name], attest that this documentation has been prepared under the direction and in the presence of [name], MD. Electronically Signed: [name], Stanley. 07/25/2015 , 12:37 PM .  Patient was seen in Room 25 .   Patient ID: Douglas [name], male    DOB: [birth year], 80 y.o.   MRN: [id number] Chief Complaint  Patient presents with  . Follow-up    DIABETES  . MEDICATIN REVIEW    PER PATIENT HAS QUESTION ABOUT THE ZOCOR   HPI Douglas [name] is a 80 y.o. male Here for follow up of HLD and DM. Last visit was for medicare wellness 04/30/15.  He's generally feeling well.   Itchy Moles He also reports having itchy moles over his back that was noticed about 3 weeks ago.   DM Lab Results  Component Value Date   MICROALBUR 0.6 04/30/2015   His A1c was 7.7 in January 2017.  Referred to diabetic educator and classes. Diet and exercise stressed, no specific diabetic medications at this time.   He has seen the nutritionist and states he was already doing most of what the nutritionist was telling him. He's been cutting back on his sugar-containing beverages (soda and juice). He denies checking his blood sugar at home. He denies chest pain, dizziness, lightheadedness or blood in stool.   Exercise He's still exercising with walking on the treadmill 1 hour each day, for 6 days a week.   Vision He last saw eye doctor 3 weeks ago. He denies being informed having retinopathy.   Dentist He last saw dentist in January 2017.   HLD Lab Results  Component Value Date   CHOL 121* 04/30/2015   HDL 39* 04/30/2015   LDLCALC 55 04/30/2015   TRIG 133 04/30/2015   CHOLHDL 3.1 04/30/2015   Lab Results  Component Value Date   ALT 27 04/30/2015   AST 23 04/30/2015   ALKPHOS 53 04/30/2015   BILITOT 0.6 04/30/2015   He has been taking zocor 40mg  qhs.   Patient Active Problem List   Diagnosis Date Noted  . Bladder outlet obstruction  04/25/2015  . Erectile dysfunction due to arterial insufficiency 04/25/2015  . Osteoporosis 04/25/2015  . Vitamin D deficiency 04/25/2015  . Heme positive stool 03/16/2014  . Abdominal pain, epigastric 03/16/2014  . Prostate cancer ([place]) 05/30/2011  . Dyslipidemia 05/30/2011  . Hypogonadism male 05/30/2011  . Acute sinusitis 01/07/2007  . RHINITIS 08/25/2006   Past Medical History  Diagnosis Date  . Cancer ([place])   . Hyperlipidemia   . Cataract   . Low testosterone   . Adenomatous colon polyp 03/2001  . Allergy     SEASONAL  . GERD (gastroesophageal reflux disease)    Past Surgical History  Procedure Laterality Date  . Spine surgery    . Appendectomy    . Eye surgery    . Prostate surgery    . Colonoscopy    . Polypectomy     No Known Allergies Prior to Admission medications   Medication Sig Start Date End Date Taking? Authorizing Provider  alfuzosin (UROXATRAL) 10 MG 24 hr tablet  11/09/14   Historical Provider, MD  aspirin 81 MG tablet Take 81 mg by mouth daily.    Historical Provider, MD  Cholecalciferol (VITAMIN D) 2000 UNITS tablet Take 2,000 Units by mouth daily.    Historical Provider, MD  fluticasone (FLONASE) 50 MCG/ACT nasal spray PLACE 2 SPRAYS INTO THE NOSE DAILY.  05/08/15   [name], MD  omeprazole (PRILOSEC) 20 MG capsule Take 1 capsule (20 mg total) by mouth daily. 05/07/15   [name], MD  simvastatin (ZOCOR) 40 MG tablet TAKE 1 TABLET BY MOUTH EVERY DAY AT 6 PM 11/15/14   Elby Beck, FNP  Testosterone 30 MG/ACT SOLN Place 30 g onto the skin.    Historical Provider, MD  vitamin C (ASCORBIC ACID) 500 MG tablet Take 500 mg by mouth daily.    Historical Provider, MD   Social History   Social History  . Marital Status: Married    Spouse Name: N/A  . Number of Children: 3  . Years of Education: 2 years [employer]   Occupational History  . RETIRED    Social History Main Topics  . Smoking status: Never Smoker   . Smokeless tobacco: Never Used  .  Alcohol Use: No     Comment: once a year  . Drug Use: No  . Sexual Activity: Yes   Other Topics Concern  . Not on file   Social History Narrative   Married. Education: [employer]. Exercise: Yes.   Review of Systems  Constitutional: Negative for fatigue and unexpected weight change.  Eyes: Negative for visual disturbance.  Respiratory: Negative for cough, chest tightness and shortness of breath.   Cardiovascular: Negative for chest pain, palpitations and leg swelling.  Gastrointestinal: Negative for abdominal pain and blood in stool.  Neurological: Negative for dizziness, light-headedness and headaches.      Objective:   Physical Exam  Constitutional: He is oriented to person, place, and time. He appears well-developed and well-nourished.  [name]:  Head: Normocephalic and atraumatic.  Eyes: EOM are normal. Pupils are equal, round, and reactive to light.  Neck: No JVD present. Carotid bruit is not present.  Cardiovascular: Normal rate, regular rhythm and normal heart sounds.   No murmur heard. Pulmonary/Chest: Effort normal and breath sounds normal. He has no rales.  Musculoskeletal: He exhibits no edema.  Neurological: He is alert and oriented to person, place, and time.  Skin: Skin is warm and dry.  Multiple hyperpigmented areas with 'stuck on' appearance  No wounds, see diabetic foot exam  Psychiatric: He has a normal mood and affect.  Vitals reviewed.   Filed Vitals:   07/25/15 1137  BP: 128/60  Pulse: 86  Temp: 98.3 F (36.8 C)  TempSrc: Oral  Resp: 16  Height: 5' 11.25" (1.81 m)  Weight: 202 lb 3.2 oz (91.717 kg)  SpO2: 97%   Results for orders placed or performed in visit on 07/25/15  POCT glucose (manual entry)  Result Value Ref Range   POC Glucose 133 (A) 70 - 99 mg/dl  POCT glycosylated hemoglobin (Hb A1C)  Result Value Ref Range   Hemoglobin A1C 6.8       Assessment & Plan:    Douglas [name] is a 80 y.o. male Diabetes mellitus type 2,  diet-controlled ([place]) - Plan: HM Diabetes Foot Exam, POCT glucose (manual entry), POCT glycosylated hemoglobin (Hb A1C)  - Able. Continue diet changes and exercise. Recheck 3 months.  Hyperlipidemia - Plan: simvastatin (ZOCOR) 20 MG tablet  -Controlled, will try lower dose of simvastatin at 20 mg daily, with follow-up lipids testing at next visit.  Meds ordered this encounter  Medications  . simvastatin (ZOCOR) 20 MG tablet    Sig: Take 1 tablet (20 mg total) by mouth daily at 6 PM.    Dispense:  90 tablet  Refill:  1   Patient Instructions       IF you received an x-ray today, you will receive an invoice from [facility]. Please contact [facility] at [phone] with questions or concerns regarding your invoice.   IF you received labwork today, you will receive an invoice from [employer]. Please contact [facility] at [phone] with questions or concerns regarding your invoice.   Our billing staff will not be able to assist you with questions regarding bills from these companies.  You will be contacted with the lab results as soon as they are available. The fastest way to get your results is to activate your My Chart account. Instructions are located on the last page of this paperwork. If you have not heard from [place] regarding the results in 2 weeks, please contact this office.     We can try lower dose of simvastatin for cholesterol as so well controlled recently. recheck cholesterol in next 3-6 months.   Continue to watch diet and avoid sugar containing beverages. Continue exercise most if not all days of the week.   Return in 3 months for repeat blood sugar check. No medications needed at this time.  If still controlled next visit - may be able to space out visit to 6 months.   Return to the clinic or go to the nearest emergency room if any of your symptoms worsen or new symptoms occur.     I personally performed the services  described in this documentation, which was scribed in my presence. The recorded information has been reviewed and considered, and addended by me as needed.

## 2015-08-08 DIAGNOSIS — M75112 Incomplete rotator cuff tear or rupture of left shoulder, not specified as traumatic: Secondary | ICD-10-CM | POA: Diagnosis not present

## 2015-09-06 DIAGNOSIS — L820 Inflamed seborrheic keratosis: Secondary | ICD-10-CM | POA: Diagnosis not present

## 2015-10-14 ENCOUNTER — Other Ambulatory Visit: Payer: Self-pay | Admitting: Gastroenterology

## 2015-10-24 ENCOUNTER — Ambulatory Visit: Payer: Medicare Other | Admitting: Family Medicine

## 2015-10-25 ENCOUNTER — Encounter: Payer: Self-pay | Admitting: Family Medicine

## 2015-10-25 ENCOUNTER — Ambulatory Visit (INDEPENDENT_AMBULATORY_CARE_PROVIDER_SITE_OTHER): Payer: Medicare Other | Admitting: Family Medicine

## 2015-10-25 VITALS — BP 126/82 | HR 86 | Temp 98.9°F | Resp 18 | Ht 71.25 in | Wt 198.0 lb

## 2015-10-25 DIAGNOSIS — E119 Type 2 diabetes mellitus without complications: Secondary | ICD-10-CM

## 2015-10-25 DIAGNOSIS — J329 Chronic sinusitis, unspecified: Secondary | ICD-10-CM | POA: Diagnosis not present

## 2015-10-25 DIAGNOSIS — R42 Dizziness and giddiness: Secondary | ICD-10-CM

## 2015-10-25 DIAGNOSIS — E785 Hyperlipidemia, unspecified: Secondary | ICD-10-CM

## 2015-10-25 LAB — COMPLETE METABOLIC PANEL WITH GFR
ALT: 19 U/L (ref 9–46)
AST: 15 U/L (ref 10–35)
Albumin: 4.2 g/dL (ref 3.6–5.1)
Alkaline Phosphatase: 52 U/L (ref 40–115)
BUN: 10 mg/dL (ref 7–25)
CHLORIDE: 105 mmol/L (ref 98–110)
CO2: 28 mmol/L (ref 20–31)
Calcium: 9.5 mg/dL (ref 8.6–10.3)
Creat: 1.01 mg/dL (ref 0.70–1.18)
GFR, EST AFRICAN AMERICAN: 82 mL/min (ref >=60)
GFR, EST NON AFRICAN AMERICAN: 71 mL/min (ref >=60)
Glucose, Bld: 142 mg/dL — ABNORMAL HIGH (ref 65–99)
POTASSIUM: 4 mmol/L (ref 3.5–5.3)
Sodium: 140 mmol/L (ref 135–146)
Total Bilirubin: 0.6 mg/dL (ref 0.2–1.2)
Total Protein: 7.2 g/dL (ref 6.1–8.1)

## 2015-10-25 LAB — CBC
HCT: 38.3 % — ABNORMAL LOW (ref 38.5–50.0)
Hemoglobin: 12.9 g/dL — ABNORMAL LOW (ref 13.2–17.1)
MCH: 31.6 pg (ref 27.0–33.0)
MCHC: 33.7 g/dL (ref 32.0–36.0)
MCV: 93.9 fL (ref 80.0–100.0)
MPV: 11.1 fL (ref 7.5–12.5)
PLATELETS: 186 10*3/uL (ref 140–400)
RBC: 4.08 MIL/uL — ABNORMAL LOW (ref 4.20–5.80)
RDW: 13.4 % (ref 11.0–15.0)
WBC: 5 10*3/uL (ref 3.8–10.8)

## 2015-10-25 LAB — LIPID PANEL
CHOL/HDL RATIO: 2.7 ratio (ref <=5.0)
Cholesterol: 146 mg/dL (ref 125–200)
HDL: 55 mg/dL (ref >=40)
LDL CALC: 73 mg/dL (ref <130)
TRIGLYCERIDES: 92 mg/dL (ref <150)
VLDL: 18 mg/dL (ref <30)

## 2015-10-25 LAB — HEMOGLOBIN A1C
Hgb A1c MFr Bld: 7.3 % — ABNORMAL HIGH (ref <5.7)
Mean Plasma Glucose: 163 mg/dL

## 2015-10-25 MED ORDER — AMOXICILLIN-POT CLAVULANATE 875-125 MG PO TABS: 1.0000 | ORAL_TABLET | Freq: Two times a day (BID) | ORAL | 0 refills | Status: DC

## 2015-10-25 NOTE — Patient Instructions (Addendum)
I will check your diabetes tests, keep up the good work with diet and exercise.  Increase your water intake, goal of 64 ounces of water per day. If dizziness persists, or any worsening of that symptom, return to discuss this further.  For your sinuses, this appears to be due to chronic sinus issues or allergies. You can try Claritin over-the-counter once per day, continue Flonase nasal spray 2 sprays per nostril each day, and saline or salt water nasal spray. If that is not helping your symptoms within the next week, I did print out an antibiotic for you to start. If you still have symptoms after starting the antibiotic, would recommend you call your ear nose and throat specialist.  I will also check your cholesterol level on the lower dose of medicine from last time.   Follow-up with me in 6 months, sooner if any new or worsening symptoms.    IF you received an x-ray today, you will receive an invoice from [facility]. Please contact [facility] at [phone] with questions or concerns regarding your invoice.   IF you received labwork today, you will receive an invoice from [employer]. Please contact [facility] at [phone] with questions or concerns regarding your invoice.   Our billing staff will not be able to assist you with questions regarding bills from these companies.  You will be contacted with the lab results as soon as they are available. The fastest way to get your results is to activate your My Chart account. Instructions are located on the last page of this paperwork. If you have not heard from [place] regarding the results in 2 weeks, please contact this office.     Dizziness Dizziness is a common problem. It is a feeling of unsteadiness or light-headedness. You may feel like you are about to faint. Dizziness can lead to injury if you stumble or fall. Anyone can become dizzy, but dizziness is more common in older adults. This condition  can be caused by a number of things, including medicines, dehydration, or illness. HOME CARE INSTRUCTIONS Taking these steps may help with your condition: Eating and Drinking  Drink enough fluid to keep your urine clear or pale yellow. This helps to keep you from becoming dehydrated. Try to drink more clear fluids, such as water.  Do not drink alcohol.  Limit your caffeine intake if directed by your health care provider.  Limit your salt intake if directed by your health care provider. Activity  Avoid making quick movements.  Rise slowly from chairs and steady yourself until you feel okay.  In the morning, first sit up on the side of the bed. When you feel okay, stand slowly while you hold onto something until you know that your balance is fine.  Move your legs often if you need to stand in one place for a long time. Tighten and relax your muscles in your legs while you are standing.  Do not drive or operate heavy machinery if you feel dizzy.  Avoid bending down if you feel dizzy. Place items in your home so that they are easy for you to reach without leaning over. Lifestyle  Do not use any tobacco products, including cigarettes, chewing tobacco, or electronic cigarettes. If you need help quitting, ask your health care provider.  Try to reduce your stress level, such as with yoga or meditation. Talk with your health care provider if you need help. General Instructions  Watch your dizziness for any changes.  Take  medicines only as directed by your health care provider. Talk with your health care provider if you think that your dizziness is caused by a medicine that you are taking.  Tell a friend or a family member that you are feeling dizzy. If he or she notices any changes in your behavior, have this person call your health care provider.  Keep all follow-up visits as directed by your health care provider. This is important. SEEK MEDICAL CARE IF:  Your dizziness does not go  away.  Your dizziness or light-headedness gets worse.  You feel nauseous.  You have reduced hearing.  You have new symptoms.  You are unsteady on your feet or you feel like the room is spinning. SEEK IMMEDIATE MEDICAL CARE IF:  You vomit or have diarrhea and are unable to eat or drink anything.  You have problems talking, walking, swallowing, or using your arms, hands, or legs.  You feel generally weak.  You are not thinking clearly or you have trouble forming sentences. It may take a friend or family member to notice this.  You have chest pain, abdominal pain, shortness of breath, or sweating.  Your vision changes.  You notice any bleeding.  You have a headache.  You have neck pain or a stiff neck.  You have a fever.   This information is not intended to replace advice given to you by your health care provider. Make sure you discuss any questions you have with your health care provider.   Document Released: 09/10/2000 Document Revised: 08/01/2014 Document Reviewed: 03/13/2014 Elsevier Interactive Patient Education 2016 [employer]  Sinusitis, Adult Sinusitis is redness, soreness, and inflammation of the paranasal sinuses. Paranasal sinuses are air pockets within the bones of your face. They are located beneath your eyes, in the middle of your forehead, and above your eyes. In healthy paranasal sinuses, mucus is able to drain out, and air is able to circulate through them by way of your nose. However, when your paranasal sinuses are inflamed, mucus and air can become trapped. This can allow bacteria and other germs to grow and cause infection. Sinusitis can develop quickly and last only a short time (acute) or continue over a long period (chronic). Sinusitis that lasts for more than 12 weeks is considered chronic. CAUSES Causes of sinusitis include:  Allergies.  Structural abnormalities, such as displacement of the cartilage that separates your nostrils (deviated  septum), which can decrease the air flow through your nose and sinuses and affect sinus drainage.  Functional abnormalities, such as when the small hairs (cilia) that line your sinuses and help remove mucus do not work properly or are not present. SIGNS AND SYMPTOMS Symptoms of acute and chronic sinusitis are the same. The primary symptoms are pain and pressure around the affected sinuses. Other symptoms include:  Upper toothache.  Earache.  Headache.  Bad breath.  Decreased sense of smell and taste.  A cough, which worsens when you are lying flat.  Fatigue.  Fever.  Thick drainage from your nose, which often is green and may contain pus (purulent).  Swelling and warmth over the affected sinuses. DIAGNOSIS Your health care provider will perform a physical exam. During your exam, your health care provider may perform any of the following to help determine if you have acute sinusitis or chronic sinusitis:  Look in your nose for signs of abnormal growths in your nostrils (nasal polyps).  Tap over the affected sinus to check for signs of infection.  View the inside  of your sinuses using an imaging device that has a light attached (endoscope). If your health care provider suspects that you have chronic sinusitis, one or more of the following tests may be recommended:  Allergy tests.  Nasal culture. A sample of mucus is taken from your nose, sent to a lab, and screened for bacteria.  Nasal cytology. A sample of mucus is taken from your nose and examined by your health care provider to determine if your sinusitis is related to an allergy. TREATMENT Most cases of acute sinusitis are related to a viral infection and will resolve on their own within 10 days. Sometimes, medicines are prescribed to help relieve symptoms of both acute and chronic sinusitis. These may include pain medicines, decongestants, nasal steroid sprays, or saline sprays. However, for sinusitis related to a  bacterial infection, your health care provider will prescribe antibiotic medicines. These are medicines that will help kill the bacteria causing the infection. Rarely, sinusitis is caused by a fungal infection. In these cases, your health care provider will prescribe antifungal medicine. For some cases of chronic sinusitis, surgery is needed. Generally, these are cases in which sinusitis recurs more than 3 times per year, despite other treatments. HOME CARE INSTRUCTIONS  Drink plenty of water. Water helps thin the mucus so your sinuses can drain more easily.  Use a humidifier.  Inhale steam 3-4 times a day (for example, sit in the bathroom with the shower running).  Apply a warm, moist washcloth to your face 3-4 times a day, or as directed by your health care provider.  Use saline nasal sprays to help moisten and clean your sinuses.  Take medicines only as directed by your health care provider.  If you were prescribed either an antibiotic or antifungal medicine, finish it all even if you start to feel better. SEEK IMMEDIATE MEDICAL CARE IF:  You have increasing pain or severe headaches.  You have nausea, vomiting, or drowsiness.  You have swelling around your face.  You have vision problems.  You have a stiff neck.  You have difficulty breathing.   This information is not intended to replace advice given to you by your health care provider. Make sure you discuss any questions you have with your health care provider.   Document Released: 03/17/2005 Document Revised: 04/07/2014 Document Reviewed: 04/01/2011 Elsevier Interactive Patient Education [employer].

## 2015-10-25 NOTE — Progress Notes (Signed)
By signing my name below, I, [name], attest that this documentation has been prepared under the direction and in the presence of [name], MD.  Electronically Signed: [name], Medical Scribe. 10/25/15. 9:41 AM.  Subjective:    Patient ID: Douglas [name], male    DOB: 03-Mar-1936, 80 y.o.   MRN: [id number]  HPI Chief Complaint  Patient presents with   Follow-up    DIABETES/SINUS ISSUES    HPI Comments: Douglas [name] is a 80 y.o. male with a PMHx of T2DM who presents to the [facility] for follow-up. Pt is fasting this morning.  T2DM: Last office visit with me was 4/26. He has recieved diabetic education classes. Exercising 6 days a week at last offiice visit. Ophtho earlier this year, no apparent retinopathy. Dentist seen in Jan. T2DM was controlled at that last office visit on diet only. Pt is still controlling his DM by diet only. Pt exercises 4 times a week. Pt can feel sensation on the top of his foot. Lab Results  Component Value Date   HGBA1C 6.8 07/25/2015   Wt Readings from Last 3 Encounters:  10/25/15 198 lb (89.8 kg)  07/25/15 202 lb 3.2 oz (91.7 kg)  07/20/15 205 lb (93 kg)   Lab Results  Component Value Date   MICROALBUR 0.6 04/30/2015   HLD: Decreased Simvastatin to 20 mg QD. Pt denies chest pain. Lab Results  Component Value Date   CHOL 121 (L) 04/30/2015   HDL 39 (L) 04/30/2015   LDLCALC 55 04/30/2015   TRIG 133 04/30/2015   CHOLHDL 3.1 04/30/2015   Lab Results  Component Value Date   ALT 27 04/30/2015   AST 23 04/30/2015   ALKPHOS 53 04/30/2015   BILITOT 0.6 04/30/2015   Sinus Issues: Most recently treated for maxillary sinus infection last August. Was on Flonase at that time but recurrent sinusitis Hx, was refferred to ENT and treated with Amoxicillin at that time. Appointment last Sept with Dr. Jinny Sanders.  He was noted to have a left septal deviation on CT scan at ENT Jan 30 2015 office visit, recommended to  continue Flonase, Afrin nasal spray up to 2 days PRN, and abx if needed for flares of sinus infection.   Pt reports head congestion that started 3 weeks ago, and vertigo that started a month ago. Pt could be sitting down and he would feel dizzy. Pt states his vertigo has been improving since onset. Pt has been getting dizzy when he sit up, and would experience light-headedness when he goes up the steps. Pt reports some cough with his allergies. Pt is outside with a lot of leaves around and wind blowing that cause flares in his allergies. Pt has been using a salt water nasal spray, and Flonase QD for relief to his symptoms. Pt states it's been months since he's used abx for his allergies. Pt denies rhinorrhea, chest tightness, and blood in stool.  Patient Active Problem List   Diagnosis Date Noted   Bladder outlet obstruction 04/25/2015   Erectile dysfunction due to arterial insufficiency 04/25/2015   Osteoporosis 04/25/2015   Vitamin D deficiency 04/25/2015   Heme positive stool 03/16/2014   Abdominal pain, epigastric 03/16/2014   Prostate cancer ([place]) 05/30/2011   Dyslipidemia 05/30/2011   Hypogonadism male 05/30/2011   Acute sinusitis 01/07/2007   RHINITIS 08/25/2006   Past Medical History:  Diagnosis Date   Adenomatous colon polyp 03/2001   Allergy    SEASONAL  Cancer ([place])    Cataract    GERD (gastroesophageal reflux disease)    Hyperlipidemia    Low testosterone    Past Surgical History:  Procedure Laterality Date   APPENDECTOMY     COLONOSCOPY     EYE SURGERY     POLYPECTOMY     PROSTATE SURGERY     SPINE SURGERY     No Known Allergies Prior to Admission medications   Medication Sig Start Date End Date Taking? Authorizing Provider  alfuzosin (UROXATRAL) 10 MG 24 hr tablet  11/09/14  Yes Historical Provider, MD  aspirin 81 MG tablet Take 81 mg by mouth daily.   Yes Historical Provider, MD  Cholecalciferol (VITAMIN D) 2000 UNITS tablet Take 2,000  Units by mouth daily.   Yes Historical Provider, MD  fluticasone (FLONASE) 50 MCG/ACT nasal spray PLACE 2 SPRAYS INTO THE NOSE DAILY. 05/08/15  Yes [name], MD  omeprazole (PRILOSEC) 20 MG capsule TAKE 1 CAPSULE DAILY 10/15/15  Yes [name], MD  simvastatin (ZOCOR) 20 MG tablet Take 1 tablet (20 mg total) by mouth daily at 6 PM. 07/25/15  Yes [name], MD  Testosterone 30 MG/ACT SOLN Place 30 g onto the skin.   Yes Historical Provider, MD  vitamin C (ASCORBIC ACID) 500 MG tablet Take 500 mg by mouth daily.   Yes Historical Provider, MD   Social History   Social History   Marital status: Married    Spouse name: N/A   Number of children: 3   Years of education: 2 years [employer]   Occupational History   RETIRED    Social History Main Topics   Smoking status: Never Smoker   Smokeless tobacco: Never Used   Alcohol use No     Comment: once a year   Drug use: No   Sexual activity: Yes   Other Topics Concern   Not on file   Social History Narrative   Married. Education: [employer]. Exercise: Yes.   Review of Systems  [name]: Positive for congestion. Negative for rhinorrhea.   Respiratory: Positive for cough. Negative for chest tightness.   Cardiovascular: Negative for chest pain.  Gastrointestinal: Negative for blood in stool.  Allergic/Immunologic: Positive for environmental allergies.  Neurological: Positive for dizziness and light-headedness. Negative for numbness.   Objective:  Physical Exam  Constitutional: He is oriented to person, place, and time. He appears well-developed and well-nourished.  [name]:  Head: Normocephalic and atraumatic.  Right Ear: Tympanic membrane, external ear and ear canal normal.  Left Ear: Tympanic membrane, external ear and ear canal normal.  Nose: No rhinorrhea.  Mouth/Throat: Oropharynx is clear and moist and mucous membranes are normal. No oropharyngeal exudate or posterior oropharyngeal erythema.  Sinuses non tender  Eyes:  Conjunctivae and EOM are normal. Pupils are equal, round, and reactive to light. Right eye exhibits no nystagmus. Left eye exhibits no nystagmus.  Neck: Neck supple.  Cardiovascular: Normal rate, regular rhythm, normal heart sounds and intact distal pulses.   No murmur heard. Pulmonary/Chest: Effort normal and breath sounds normal. He has no wheezes. He has no rhonchi. He has no rales.  Abdominal: Soft. There is no tenderness.  Lymphadenopathy:    He has no cervical adenopathy.  Neurological: He is alert and oriented to person, place, and time. No sensory deficit. He displays a negative Romberg sign.  No pronator drift Nl heel to toe  Skin: Skin is warm and dry. No rash noted.  Psychiatric: He has a normal  mood and affect. His behavior is normal.  Vitals reviewed.  BP 126/82    Pulse 86    Temp 98.9 F (37.2 C) (Oral)    Resp 18    Ht 5' 11.25" (1.81 m)    Wt 198 lb (89.8 kg)    SpO2 97%    BMI 27.42 kg/m  Assessment & Plan:    Douglas [name] is a 80 y.o. male Diabetes mellitus type 2, diet-controlled ([place]) - Plan: Hemoglobin A1C  -Has been controlled. Continue to work on diet and exercise, A1c pending.  Hyperlipidemia - Plan: COMPLETE METABOLIC PANEL WITH GFR, Lipid panel  -Its pending on lower dose of statin.  Chronic sinusitis, unspecified location - Plan: amoxicillin-clavulanate (AUGMENTIN) 875-125 MG tablet  - Small acute on chronic sinusitis. Symptomatic care discussed as in after visit summary, and if not improving into next week, can start Augmentin for possible secondary acute sinusitis. If still persistent symptoms, follow-up with ENT. RTC precautions.  Dizziness - Plan: CBC, COMPLETE METABOLIC PANEL WITH GFR  -Likely middle ear or due to sinus symptoms.  Treatment as above, ER/RTC precautions if worsening or new associated symptoms.  Meds ordered this encounter  Medications   amoxicillin-clavulanate (AUGMENTIN) 875-125 MG tablet    Sig: Take 1 tablet by mouth 2 (two)  times daily.    Dispense:  20 tablet    Refill:  0   Patient Instructions   I will check your diabetes tests, keep up the good work with diet and exercise.  Increase your water intake, goal of 64 ounces of water per day. If dizziness persists, or any worsening of that symptom, return to discuss this further.  For your sinuses, this appears to be due to chronic sinus issues or allergies. You can try Claritin over-the-counter once per day, continue Flonase nasal spray 2 sprays per nostril each day, and saline or salt water nasal spray. If that is not helping your symptoms within the next week, I did print out an antibiotic for you to start. If you still have symptoms after starting the antibiotic, would recommend you call your ear nose and throat specialist.  I will also check your cholesterol level on the lower dose of medicine from last time.   Follow-up with me in 6 months, sooner if any new or worsening symptoms.    IF you received an x-ray today, you will receive an invoice from [facility]. Please contact [facility] at [phone] with questions or concerns regarding your invoice.   IF you received labwork today, you will receive an invoice from [employer]. Please contact [facility] at [phone] with questions or concerns regarding your invoice.   Our billing staff will not be able to assist you with questions regarding bills from these companies.  You will be contacted with the lab results as soon as they are available. The fastest way to get your results is to activate your My Chart account. Instructions are located on the last page of this paperwork. If you have not heard from [place] regarding the results in 2 weeks, please contact this office.     Dizziness Dizziness is a common problem. It is a feeling of unsteadiness or light-headedness. You may feel like you are about to faint. Dizziness can lead to injury if you stumble or fall.  Anyone can become dizzy, but dizziness is more common in older adults. This condition can be caused by a number of things, including medicines, dehydration, or illness. HOME CARE  INSTRUCTIONS Taking these steps may help with your condition: Eating and Drinking  Drink enough fluid to keep your urine clear or pale yellow. This helps to keep you from becoming dehydrated. Try to drink more clear fluids, such as water.  Do not drink alcohol.  Limit your caffeine intake if directed by your health care provider.  Limit your salt intake if directed by your health care provider. Activity  Avoid making quick movements.  Rise slowly from chairs and steady yourself until you feel okay.  In the morning, first sit up on the side of the bed. When you feel okay, stand slowly while you hold onto something until you know that your balance is fine.  Move your legs often if you need to stand in one place for a long time. Tighten and relax your muscles in your legs while you are standing.  Do not drive or operate heavy machinery if you feel dizzy.  Avoid bending down if you feel dizzy. Place items in your home so that they are easy for you to reach without leaning over. Lifestyle  Do not use any tobacco products, including cigarettes, chewing tobacco, or electronic cigarettes. If you need help quitting, ask your health care provider.  Try to reduce your stress level, such as with yoga or meditation. Talk with your health care provider if you need help. General Instructions  Watch your dizziness for any changes.  Take medicines only as directed by your health care provider. Talk with your health care provider if you think that your dizziness is caused by a medicine that you are taking.  Tell a friend or a family member that you are feeling dizzy. If he or she notices any changes in your behavior, have this person call your health care provider.  Keep all follow-up visits as directed by your health  care provider. This is important. SEEK MEDICAL CARE IF:  Your dizziness does not go away.  Your dizziness or light-headedness gets worse.  You feel nauseous.  You have reduced hearing.  You have new symptoms.  You are unsteady on your feet or you feel like the room is spinning. SEEK IMMEDIATE MEDICAL CARE IF:  You vomit or have diarrhea and are unable to eat or drink anything.  You have problems talking, walking, swallowing, or using your arms, hands, or legs.  You feel generally weak.  You are not thinking clearly or you have trouble forming sentences. It may take a friend or family member to notice this.  You have chest pain, abdominal pain, shortness of breath, or sweating.  Your vision changes.  You notice any bleeding.  You have a headache.  You have neck pain or a stiff neck.  You have a fever.   This information is not intended to replace advice given to you by your health care provider. Make sure you discuss any questions you have with your health care provider.   Document Released: 09/10/2000 Document Revised: 08/01/2014 Document Reviewed: 03/13/2014 Elsevier Interactive Patient Education 2016 Elsevier Inc.  Sinusitis, Adult Sinusitis is redness, soreness, and inflammation of the paranasal sinuses. Paranasal sinuses are air pockets within the bones of your face. They are located beneath your eyes, in the middle of your forehead, and above your eyes. In healthy paranasal sinuses, mucus is able to drain out, and air is able to circulate through them by way of your nose. However, when your paranasal sinuses are inflamed, mucus and air can become trapped. This can allow bacteria and  other germs to grow and cause infection. Sinusitis can develop quickly and last only a short time (acute) or continue over a long period (chronic). Sinusitis that lasts for more than 12 weeks is considered chronic. CAUSES Causes of sinusitis include:  Allergies.  Structural  abnormalities, such as displacement of the cartilage that separates your nostrils (deviated septum), which can decrease the air flow through your nose and sinuses and affect sinus drainage.  Functional abnormalities, such as when the small hairs (cilia) that line your sinuses and help remove mucus do not work properly or are not present. SIGNS AND SYMPTOMS Symptoms of acute and chronic sinusitis are the same. The primary symptoms are pain and pressure around the affected sinuses. Other symptoms include:  Upper toothache.  Earache.  Headache.  Bad breath.  Decreased sense of smell and taste.  A cough, which worsens when you are lying flat.  Fatigue.  Fever.  Thick drainage from your nose, which often is green and may contain pus (purulent).  Swelling and warmth over the affected sinuses. DIAGNOSIS Your health care provider will perform a physical exam. During your exam, your health care provider may perform any of the following to help determine if you have acute sinusitis or chronic sinusitis:  Look in your nose for signs of abnormal growths in your nostrils (nasal polyps).  Tap over the affected sinus to check for signs of infection.  View the inside of your sinuses using an imaging device that has a light attached (endoscope). If your health care provider suspects that you have chronic sinusitis, one or more of the following tests may be recommended:  Allergy tests.  Nasal culture. A sample of mucus is taken from your nose, sent to a lab, and screened for bacteria.  Nasal cytology. A sample of mucus is taken from your nose and examined by your health care provider to determine if your sinusitis is related to an allergy. TREATMENT Most cases of acute sinusitis are related to a viral infection and will resolve on their own within 10 days. Sometimes, medicines are prescribed to help relieve symptoms of both acute and chronic sinusitis. These may include pain medicines,  decongestants, nasal steroid sprays, or saline sprays. However, for sinusitis related to a bacterial infection, your health care provider will prescribe antibiotic medicines. These are medicines that will help kill the bacteria causing the infection. Rarely, sinusitis is caused by a fungal infection. In these cases, your health care provider will prescribe antifungal medicine. For some cases of chronic sinusitis, surgery is needed. Generally, these are cases in which sinusitis recurs more than 3 times per year, despite other treatments. HOME CARE INSTRUCTIONS  Drink plenty of water. Water helps thin the mucus so your sinuses can drain more easily.  Use a humidifier.  Inhale steam 3-4 times a day (for example, sit in the bathroom with the shower running).  Apply a warm, moist washcloth to your face 3-4 times a day, or as directed by your health care provider.  Use saline nasal sprays to help moisten and clean your sinuses.  Take medicines only as directed by your health care provider.  If you were prescribed either an antibiotic or antifungal medicine, finish it all even if you start to feel better. SEEK IMMEDIATE MEDICAL CARE IF:  You have increasing pain or severe headaches.  You have nausea, vomiting, or drowsiness.  You have swelling around your face.  You have vision problems.  You have a stiff neck.  You have difficulty  breathing.   This information is not intended to replace advice given to you by your health care provider. Make sure you discuss any questions you have with your health care provider.   Document Released: 03/17/2005 Document Revised: 04/07/2014 Document Reviewed: 04/01/2011 Elsevier Interactive Patient Education [employer].    I personally performed the services described in this documentation, which was scribed in my presence. The recorded information has been reviewed and considered, and addended by me as needed.   Signed,   [name],  MD [facility].  10/26/15 12:17 PM

## 2015-11-14 DIAGNOSIS — H43813 Vitreous degeneration, bilateral: Secondary | ICD-10-CM | POA: Diagnosis not present

## 2015-11-14 DIAGNOSIS — Z961 Presence of intraocular lens: Secondary | ICD-10-CM | POA: Diagnosis not present

## 2015-11-14 DIAGNOSIS — H40021 Open angle with borderline findings, high risk, right eye: Secondary | ICD-10-CM | POA: Diagnosis not present

## 2015-11-14 DIAGNOSIS — H35033 Hypertensive retinopathy, bilateral: Secondary | ICD-10-CM | POA: Diagnosis not present

## 2015-12-07 ENCOUNTER — Ambulatory Visit (INDEPENDENT_AMBULATORY_CARE_PROVIDER_SITE_OTHER): Payer: Medicare Other | Admitting: Urgent Care

## 2015-12-07 DIAGNOSIS — Z23 Encounter for immunization: Secondary | ICD-10-CM | POA: Diagnosis not present

## 2015-12-07 NOTE — Progress Notes (Signed)
Pt received influenza vaccine. Tolerated well.

## 2015-12-13 IMAGING — CT CT HEAD W/O CM
2 series · 16 of 30 positions shown, 20 images · non-contrast
Comparison: None

CLINICAL DATA: Episode of dizziness occurring 1 day last week.
Initial encounter. History of prostate cancer.

EXAM:
CT HEAD WITHOUT CONTRAST
TECHNIQUE: Contiguous axial images were obtained from the base of the skull
through the vertex without contrast.

[Series 2: head w/o · axial · non-contrast · 0.45mm/px · z∈[+23,+145]mm · 13 of 28 slices shown, 17 images]
[im 2/28  brain]
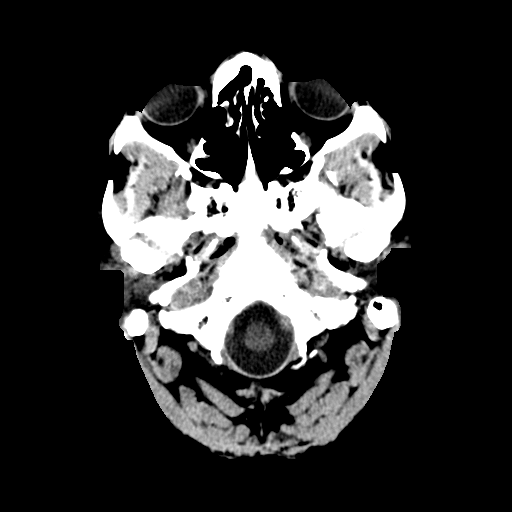
[im 2/28  bone]
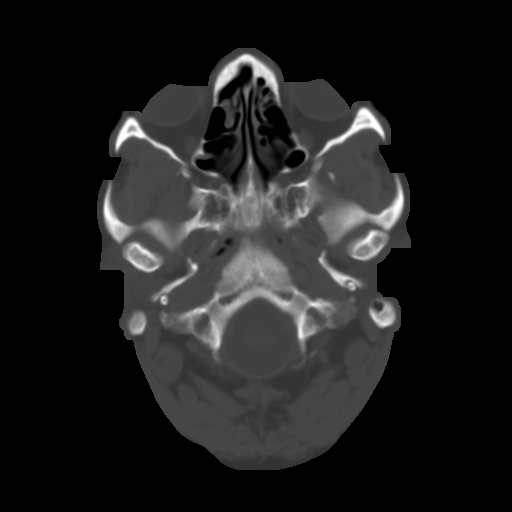
[im 4/28  brain]
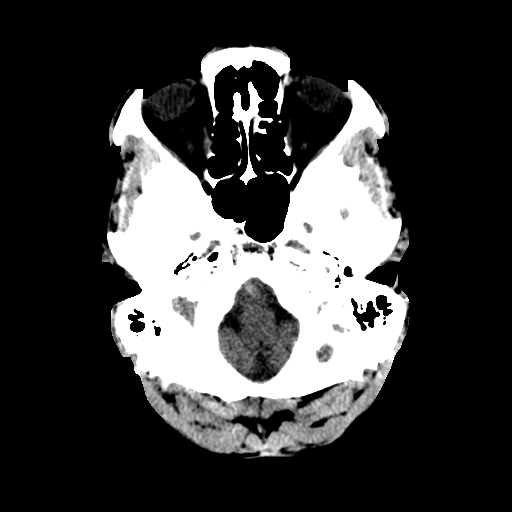
[im 6/28  brain]
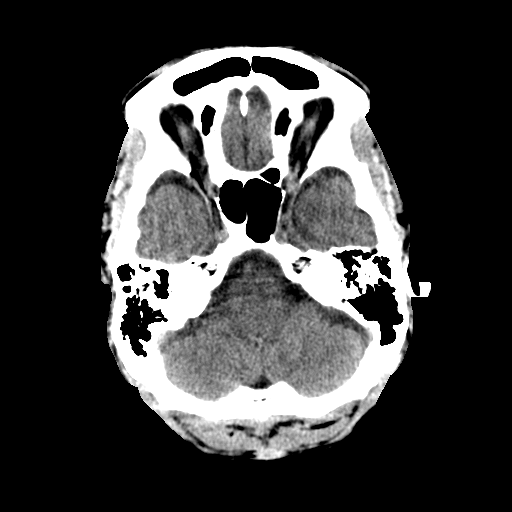
[im 8/28  brain]
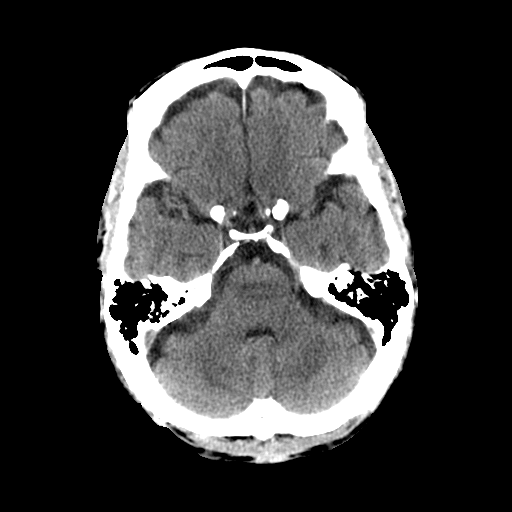
[im 10/28  brain]
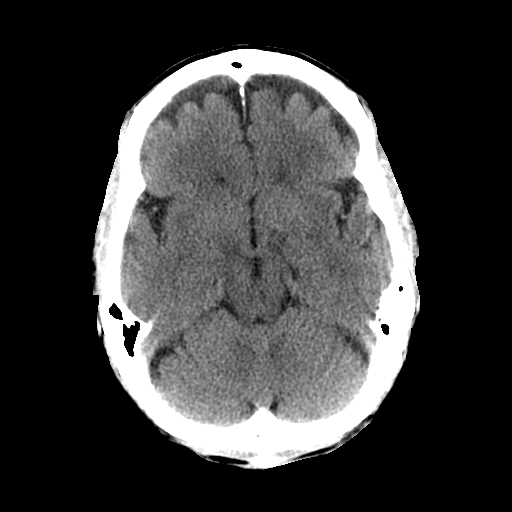
[im 10/28  bone]
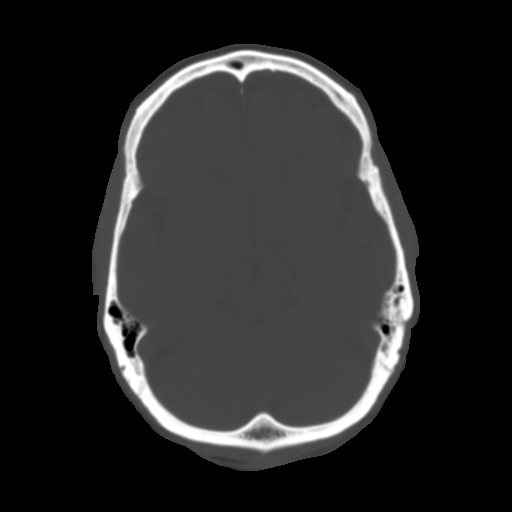
[im 12/28  brain]
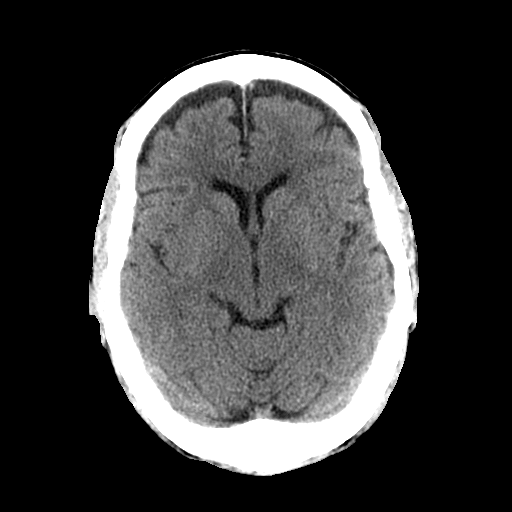
[im 14/28  brain]
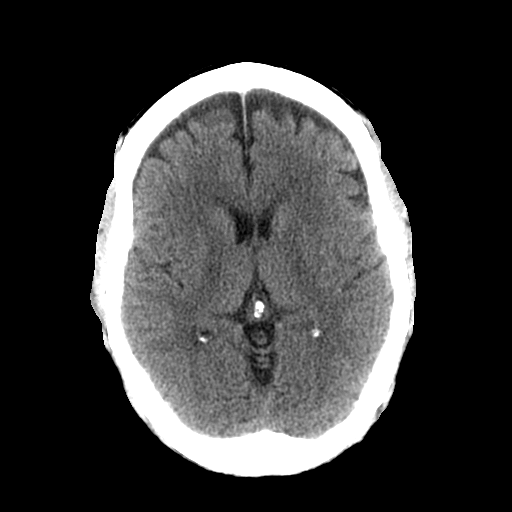
[im 16/28  brain]
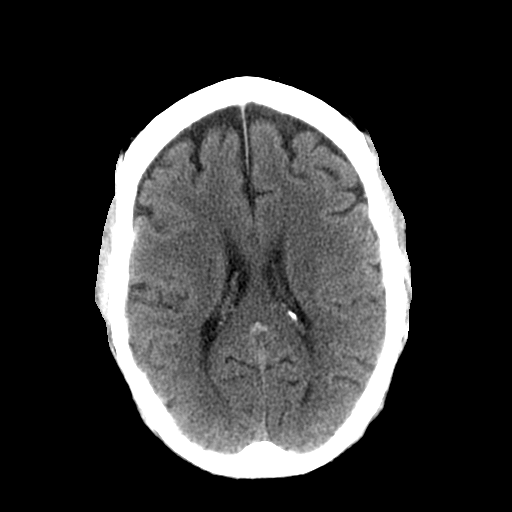
[im 18/28  brain]
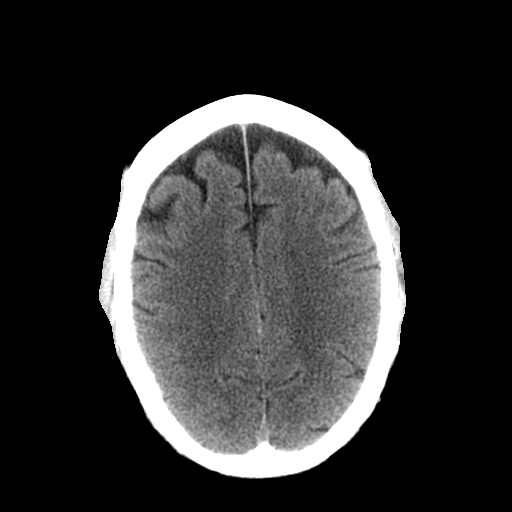
[im 18/28  bone]
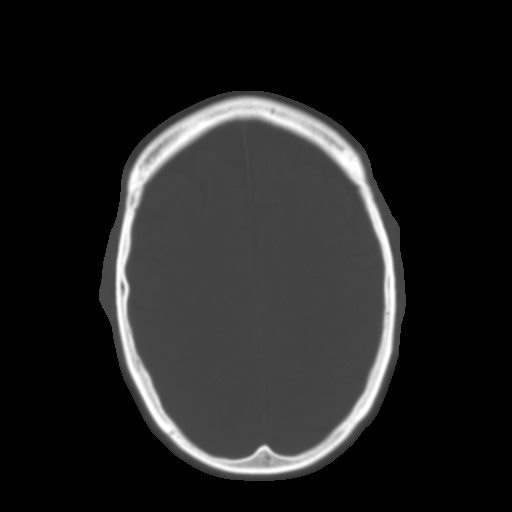
[im 20/28  brain]
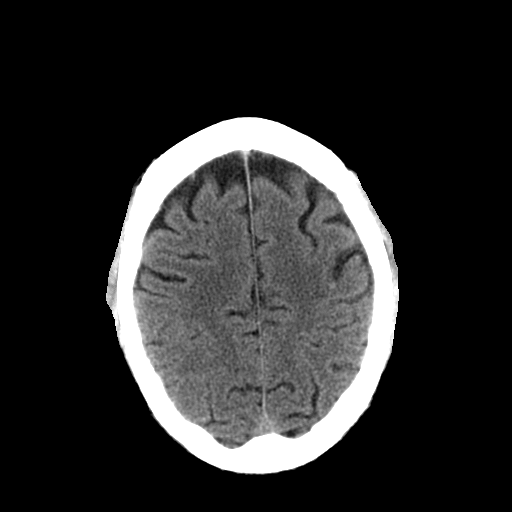
[im 22/28  brain]
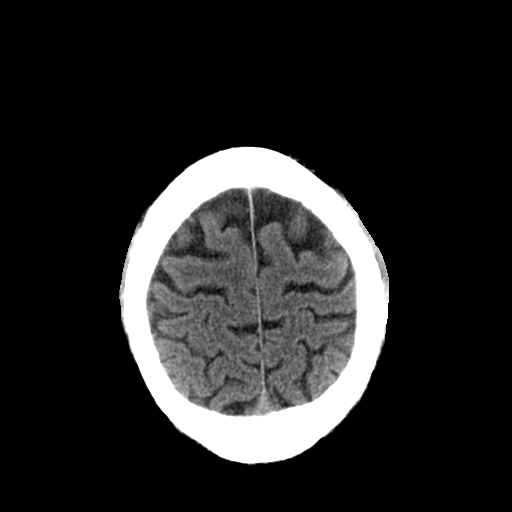
[im 24/28  brain]
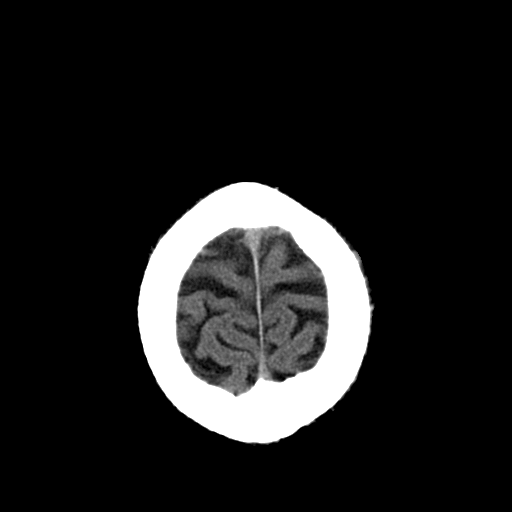
[im 26/28  brain]
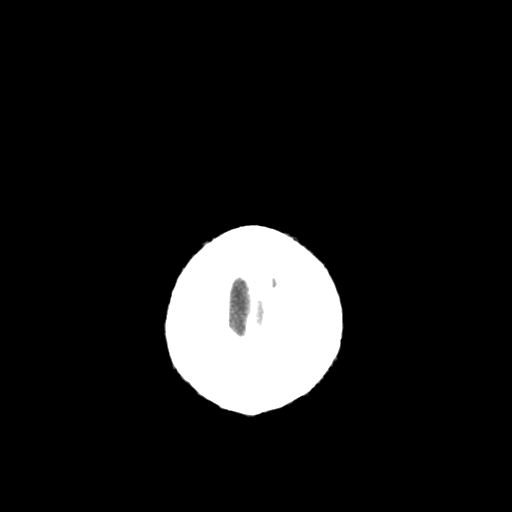
[im 26/28  bone]
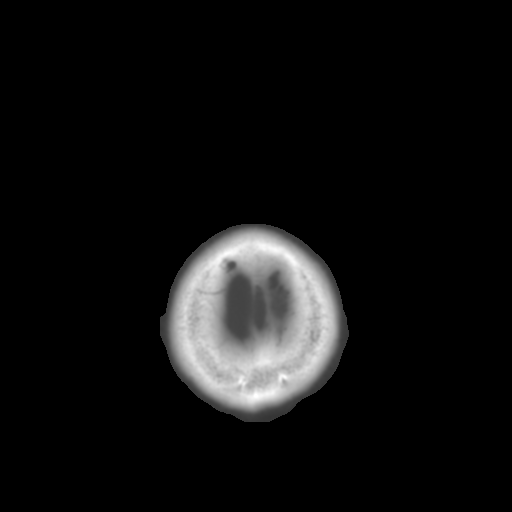

[Series 3: head bone · axial · 0.45mm/px · z∈[+23,+64]mm · 3 of 28 slices shown]
[im 2/28  bone]
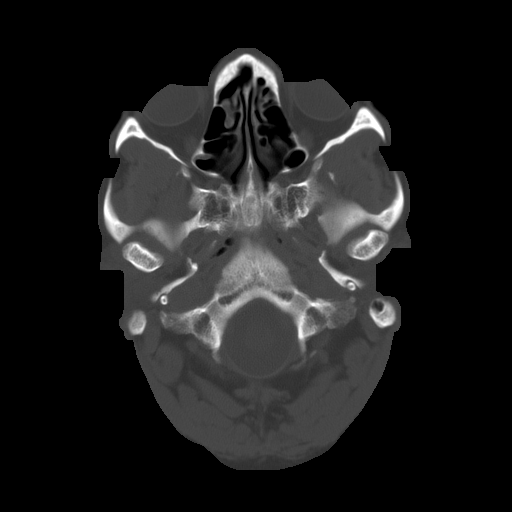
[im 6/28  bone]
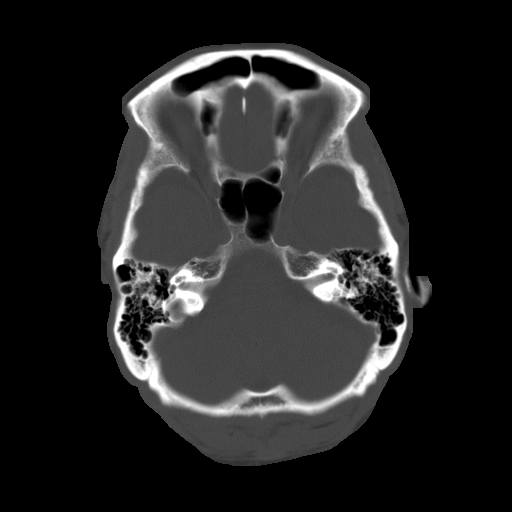
[im 10/28  bone]
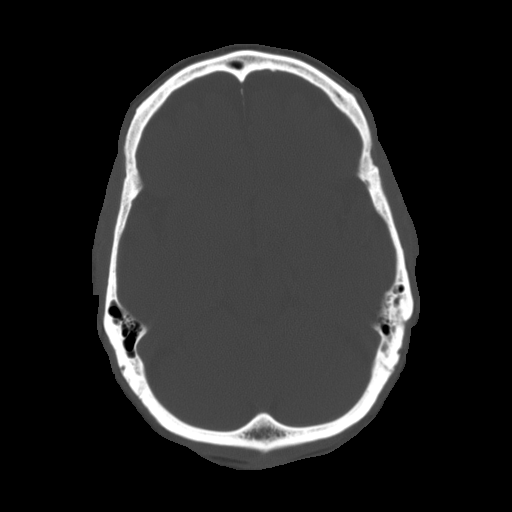

[16 of 30 positions shown; findings below may reference images not displayed]

FINDINGS: No evidence for acute infarction, hemorrhage, mass lesion,
hydrocephalus, or extra-axial fluid. Mild bifrontal atrophy, with
slight prominence of the extracerebral CSF collections, non
worrisome. Mild vascular calcification. Calvarium intact. No osseous
sclerotic or lytic lesions.
IMPRESSION: No acute intracranial abnormality.

## 2016-01-19 ENCOUNTER — Other Ambulatory Visit: Payer: Self-pay | Admitting: Family Medicine

## 2016-01-19 DIAGNOSIS — E785 Hyperlipidemia, unspecified: Secondary | ICD-10-CM

## 2016-02-18 ENCOUNTER — Telehealth: Payer: Self-pay | Admitting: Gastroenterology

## 2016-02-18 MED ORDER — OMEPRAZOLE 20 MG PO CPDR: 20.0000 mg | DELAYED_RELEASE_CAPSULE | Freq: Every day | ORAL | 0 refills | Status: DC

## 2016-02-18 NOTE — Telephone Encounter (Signed)
Prescription sent to patient's mail order pharmacy and patient notified to keep appt for any further refills. Patient verbalized understanding.

## 2016-03-18 DIAGNOSIS — R972 Elevated prostate specific antigen [PSA]: Secondary | ICD-10-CM | POA: Diagnosis not present

## 2016-03-18 DIAGNOSIS — E291 Testicular hypofunction: Secondary | ICD-10-CM | POA: Diagnosis not present

## 2016-03-25 DIAGNOSIS — E291 Testicular hypofunction: Secondary | ICD-10-CM | POA: Diagnosis not present

## 2016-03-25 DIAGNOSIS — N401 Enlarged prostate with lower urinary tract symptoms: Secondary | ICD-10-CM | POA: Diagnosis not present

## 2016-03-25 DIAGNOSIS — C61 Malignant neoplasm of prostate: Secondary | ICD-10-CM | POA: Diagnosis not present

## 2016-03-25 DIAGNOSIS — R3915 Urgency of urination: Secondary | ICD-10-CM | POA: Diagnosis not present

## 2016-03-25 DIAGNOSIS — N5201 Erectile dysfunction due to arterial insufficiency: Secondary | ICD-10-CM | POA: Diagnosis not present

## 2016-03-27 ENCOUNTER — Ambulatory Visit: Payer: Medicare Other | Admitting: Gastroenterology

## 2016-04-01 ENCOUNTER — Other Ambulatory Visit: Payer: Self-pay | Admitting: Family Medicine

## 2016-04-01 DIAGNOSIS — E785 Hyperlipidemia, unspecified: Secondary | ICD-10-CM

## 2016-04-29 ENCOUNTER — Other Ambulatory Visit: Payer: Self-pay | Admitting: Emergency Medicine

## 2016-04-29 NOTE — Telephone Encounter (Signed)
I provided a refill of 1 inhaler. I do not see an indication for this in the patient's chart. I recommended he come back in for an evaluation and additional refills. I will forward this to Dr. Carlota Raspberry, as he has seen the patient for his last 2 office visits with our clinic.

## 2016-05-01 ENCOUNTER — Ambulatory Visit (INDEPENDENT_AMBULATORY_CARE_PROVIDER_SITE_OTHER): Payer: Medicare Other | Admitting: Family Medicine

## 2016-05-01 ENCOUNTER — Encounter: Payer: Self-pay | Admitting: Family Medicine

## 2016-05-01 ENCOUNTER — Other Ambulatory Visit: Payer: Self-pay | Admitting: Gastroenterology

## 2016-05-01 VITALS — BP 139/62 | HR 77 | Temp 98.1°F | Resp 16 | Ht 71.5 in | Wt 205.4 lb

## 2016-05-01 DIAGNOSIS — J309 Allergic rhinitis, unspecified: Secondary | ICD-10-CM | POA: Diagnosis not present

## 2016-05-01 DIAGNOSIS — E785 Hyperlipidemia, unspecified: Secondary | ICD-10-CM

## 2016-05-01 DIAGNOSIS — R42 Dizziness and giddiness: Secondary | ICD-10-CM

## 2016-05-01 DIAGNOSIS — E119 Type 2 diabetes mellitus without complications: Secondary | ICD-10-CM

## 2016-05-01 DIAGNOSIS — E870 Hyperosmolality and hypernatremia: Secondary | ICD-10-CM

## 2016-05-01 MED ORDER — FLUTICASONE PROPIONATE 50 MCG/ACT NA SUSP: NASAL | 1 refills | Status: DC

## 2016-05-01 MED ORDER — MECLIZINE HCL 25 MG PO TABS: 25.0000 mg | ORAL_TABLET | Freq: Three times a day (TID) | ORAL | 0 refills | Status: DC | PRN

## 2016-05-01 MED ORDER — SIMVASTATIN 20 MG PO TABS: ORAL_TABLET | ORAL | 1 refills | Status: DC

## 2016-05-01 NOTE — Progress Notes (Signed)
By signing my name below, I, Douglas [name], attest that this documentation has been prepared under the direction and in the presence of Douglas Ray, MD.  Electronically Signed: [name], Medical Scribe. 05/01/16. 10:15 AM.  Subjective:    Patient ID: Douglas [name], male    DOB: Nov 12, 1935, 81 y.o.   MRN: [id number]  HPI Chief Complaint  Patient presents with  . Follow-up    6 MONTHS for DIABETES  . Medication Refill    ZOCOR    HPI Comments: Douglas [name] is a 81 y.o. male who presents to the [facility] for DM follow-up. Last visit for DM was July 2017. Ophthomologist earlier last year, no retinopathy. Dentist Jan 2017. He was exercising 4x a week. DM diet controlled -  Borderline elevated A1c, recommended diet and exercise.  DM: He is on ASA 81 mg QD and continues to be diet controlled for his DM. Pt is compliant with ASA. Pt states the portion control has messed up his diet and he continues to eat sweets. Pt drinks sweet tea and he rarely drinks soda - he doesn't drink sugar free soda. Pt exercises 5-6x a week. Pt has an ophthalmologist visit in March and his last dentist appt was "a couple of days ago". Pt denies any new changes in his health. Lab Results  Component Value Date   HGBA1C 7.3 (H) 10/25/2015   Lab Results  Component Value Date   MICROALBUR 0.6 04/30/2015   Wt Readings from Last 3 Encounters:  05/01/16 205 lb 6.4 oz (93.2 kg)  10/25/15 198 lb (89.8 kg)  07/25/15 202 lb 3.2 oz (91.7 kg)   HLD: He is on zocor 20 mg QD. Pt is compliant with his medication and he just ran out yesterday. Denies melena, abdominal pain, diarrhea, chest pain, light-headedness, dizziness or other negative side effects from his medication. Lab Results  Component Value Date   CHOL 146 10/25/2015   HDL 55 10/25/2015   LDLCALC 73 10/25/2015   TRIG 92 10/25/2015   CHOLHDL 2.7 10/25/2015   Lab Results  Component Value Date   ALT 19 10/25/2015   AST 15  10/25/2015   ALKPHOS 52 10/25/2015   BILITOT 0.6 10/25/2015   Allergic Rhinitis: Flonase nasal spray rx last year. Pt uses flonase daily (2 sprays in each nostril) and occasionally salt spray. Pt reports vertigo with his last episode last month. Mentions it usually occurs when his sinuses are occluded or when he has a sinus infection. He has used flonase without relief of his sxs. Denies chest pain, chest tightness, slurred speech with those sx's.   Immunizations: Immunization History  Administered Date(s) Administered  . Influenza Split 12/30/2010, 12/25/2011  . Influenza,inj,Quad PF,36+ Mos 12/07/2012, 12/13/2013, 12/06/2014, 12/07/2015  . Pneumococcal Conjugate-13 04/24/2014  . Pneumococcal Polysaccharide-23 05/31/2001, 06/08/2009  . Td 06/12/2008  . Zoster 03/31/2008    Patient Active Problem List   Diagnosis Date Noted  . Bladder outlet obstruction 04/25/2015  . Erectile dysfunction due to arterial insufficiency 04/25/2015  . Osteoporosis 04/25/2015  . Vitamin D deficiency 04/25/2015  . Heme positive stool 03/16/2014  . Abdominal pain, epigastric 03/16/2014  . Prostate cancer ([place]) 05/30/2011  . Dyslipidemia 05/30/2011  . Hypogonadism male 05/30/2011  . Acute sinusitis 01/07/2007  . RHINITIS 08/25/2006   Past Medical History:  Diagnosis Date  . Adenomatous colon polyp 03/2001  . Allergy    SEASONAL  . Cancer ([place])   . Cataract   . GERD (  gastroesophageal reflux disease)   . Hyperlipidemia   . Low testosterone    Past Surgical History:  Procedure Laterality Date  . APPENDECTOMY    . COLONOSCOPY    . EYE SURGERY    . POLYPECTOMY    . PROSTATE SURGERY    . SPINE SURGERY     No Known Allergies Prior to Admission medications   Medication Sig Start Date End Date Taking? Authorizing Provider  alfuzosin (UROXATRAL) 10 MG 24 hr tablet  11/09/14  Yes Historical Provider, MD  aspirin 81 MG tablet Take 81 mg by mouth daily.   Yes Historical Provider, MD  Cholecalciferol  (VITAMIN D) 2000 UNITS tablet Take 2,000 Units by mouth daily.   Yes Historical Provider, MD  fluticasone (FLONASE) 50 MCG/ACT nasal spray PLACE 2 SPRAYS INTO THE NOSE DAILY. 05/08/15  Yes [name], MD  omeprazole (PRILOSEC) 20 MG capsule Take 1 capsule (20 mg total) by mouth daily. 02/18/16  Yes [name], MD  simvastatin (ZOCOR) 20 MG tablet TAKE 1 TABLET DAILY AT 6PM 01/20/16  Yes [name], MD  vitamin C (ASCORBIC ACID) 500 MG tablet Take 500 mg by mouth daily.   Yes Historical Provider, MD  PROAIR HFA 108 [phone] Base) MCG/ACT inhaler INHALE 2 PUFFS INTO THE LUNGS EVERY 4 HOURS AS NEEDED Patient not taking: Reported on 05/01/2016 04/29/16   Jaynee Eagles, PA-C  Testosterone 30 MG/ACT SOLN Place 30 g onto the skin.    Historical Provider, MD   Social History   Social History  . Marital status: Married    Spouse name: N/A  . Number of children: 3  . Years of education: 2 years [employer]   Occupational History  . RETIRED    Social History Main Topics  . Smoking status: Never Smoker  . Smokeless tobacco: Never Used  . Alcohol use No     Comment: once a year  . Drug use: No  . Sexual activity: Yes   Other Topics Concern  . Not on file   Social History Narrative   Married. Education: [employer]. Exercise: Yes.   Review of Systems  Constitutional: Negative for fatigue and unexpected weight change.  Eyes: Negative for visual disturbance.  Respiratory: Negative for cough, chest tightness and shortness of breath.   Cardiovascular: Negative for chest pain, palpitations and leg swelling.  Gastrointestinal: Negative for abdominal pain and blood in stool.  Neurological: Positive for dizziness (vertigo). Negative for speech difficulty, light-headedness and headaches.    Objective:  Physical Exam  Constitutional: He is oriented to person, place, and time. He appears well-developed and well-nourished.  [name]:  Head: Normocephalic and atraumatic.  Mouth/Throat: Oropharynx is clear and  moist.  No sinus pain Edema of turbinates bilaterally  Eyes: EOM are normal. Pupils are equal, round, and reactive to light. Right eye exhibits no nystagmus. Left eye exhibits no nystagmus.  Neck: No JVD present. Carotid bruit is not present.  Cardiovascular: Normal rate, regular rhythm and normal heart sounds.   No murmur heard. Pulmonary/Chest: Effort normal and breath sounds normal. He has no rales.  Musculoskeletal: He exhibits no edema.  Neurological: He is alert and oriented to person, place, and time. He displays a negative Romberg sign.  No pronator drift Nl heel to toe Non focal exam  Skin: Skin is warm and dry.  Psychiatric: He has a normal mood and affect.  Vitals reviewed.  There were no vitals taken for this visit. Assessment & Plan:   Eri Schu  Erlich is a 81 y.o. male Type 2 diabetes mellitus without complication, without long-term current use of insulin ([place]) - Plan: Hemoglobin A1C, Microalbumin, urine, Care order/instruction:  - Previously diet controlled. A1c slightly elevated last visit, still appears to have some difficulty with Sweet tea and sugar/portion control. Tips were given regarding portion control, avoidance or mixing half and half of Sweet tea and unsweetened. Continue exercise/activity, and A1c pending. Consider low-dose metformin if still elevated.  Allergic rhinitis, unspecified chronicity, unspecified seasonality, unspecified trigger - Plan: fluticasone (FLONASE) 50 MCG/ACT nasal spray  - Avoidance measures, continue Flonase nasal spray, saline nasal spray.  Vertigo - Plan: meclizine (ANTIVERT) 25 MG tablet  - During times of sinus congestion only. Nonfocal exam, no red flags on history or exam. Prescription for meclizine given if symptomatic, RTC precautions if new symptoms during vertigo, or recurrent symptoms.  Hyperlipidemia, unspecified hyperlipidemia type - Plan: Comprehensive metabolic panel, Lipid panel, simvastatin (ZOCOR) 20 MG  tablet  -Tolerating Zocor at current dose. Labs pending, follow-up in 3 months.  Meds ordered this encounter  Medications  . meclizine (ANTIVERT) 25 MG tablet    Sig: Take 1 tablet (25 mg total) by mouth 3 (three) times daily as needed for dizziness.    Dispense:  30 tablet    Refill:  0  . simvastatin (ZOCOR) 20 MG tablet    Sig: TAKE 1 TABLET DAILY AT 6PM    Dispense:  90 tablet    Refill:  1  . fluticasone (FLONASE) 50 MCG/ACT nasal spray    Sig: PLACE 2 SPRAYS INTO THE NOSE DAILY.    Dispense:  48 g    Refill:  1   Patient Instructions   I will check your hemoglobin A1c and test for sugar in the urine today. If the A1c is still elevated, we may need to start a low-dose medication for diabetes. Work on portion control, avoidance of sugar containing beverages including Sweet tea and continue the good work with exercise. Follow-up with me in 3 months for reassessment of your blood sugar.  Continue Flonase and saline nasal spray for sinus congestion. If you do have another bout of vertigo, I did prescribe meclizine to be taken up to 3 times per day. If those symptoms persist more than a day or 2, or any new symptoms with the vertigo, return here or the emergency room if needed. Please let me know if you have any questions.   Vertigo Vertigo is the feeling that you or your surroundings are moving when they are not. Vertigo can be dangerous if it occurs while you are doing something that could endanger you or others, such as driving. What are the causes? This condition is caused by a disturbance in the signals that are sent by your body's sensory systems to your brain. Different causes of a disturbance can lead to vertigo, including:  Infections, especially in the inner ear.  A bad reaction to a drug, or misuse of alcohol and medicines.  Withdrawal from drugs or alcohol.  Quickly changing positions, as when lying down or rolling over in bed.  Migraine headaches.  Decreased blood  flow to the brain.  Decreased blood pressure.  Increased pressure in the brain from a head or neck injury, stroke, infection, tumor, or bleeding.  Central nervous system disorders. What are the signs or symptoms? Symptoms of this condition usually occur when you move your head or your eyes in different directions. Symptoms may start suddenly, and they usually last for  less than a minute. Symptoms may include:  Loss of balance and falling.  Feeling like you are spinning or moving.  Feeling like your surroundings are spinning or moving.  Nausea and vomiting.  Blurred vision or double vision.  Difficulty hearing.  Slurred speech.  Dizziness.  Involuntary eye movement (nystagmus). Symptoms can be mild and cause only slight annoyance, or they can be severe and interfere with daily life. Episodes of vertigo may return (recur) over time, and they are often triggered by certain movements. Symptoms may improve over time. How is this diagnosed? This condition may be diagnosed based on medical history and the quality of your nystagmus. Your health care provider may test your eye movements by asking you to quickly change positions to trigger the nystagmus. This may be called the Dix-Hallpike test, head thrust test, or roll test. You may be referred to a health care provider who specializes in ear, nose, and throat (ENT) problems (otolaryngologist) or a provider who specializes in disorders of the central nervous system (neurologist). You may have additional testing, including:  A physical exam.  Blood tests.  MRI.  A CT scan.  An electrocardiogram (ECG). This records electrical activity in your heart.  An electroencephalogram (EEG). This records electrical activity in your brain.  Hearing tests. How is this treated? Treatment for this condition depends on the cause and the severity of the symptoms. Treatment options include:  Medicines to treat nausea or vertigo. These are usually  used for severe cases. Some medicines that are used to treat other conditions may also reduce or eliminate vertigo symptoms. These include:  Medicines that control allergies (antihistamines).  Medicines that control seizures (anticonvulsants).  Medicines that relieve depression (antidepressants).  Medicines that relieve anxiety (sedatives).  Head movements to adjust your inner ear back to normal. If your vertigo is caused by an ear problem, your health care provider may recommend certain movements to correct the problem.  Surgery. This is rare. Follow these instructions at home: Safety  Move slowly.Avoid sudden body or head movements.  Avoid driving.  Avoid operating heavy machinery.  Avoid doing any tasks that would cause danger to you or others if you would have a vertigo episode during the task.  If you have trouble walking or keeping your balance, try using a cane for stability. If you feel dizzy or unstable, sit down right away.  Return to your normal activities as told by your health care provider. Ask your health care provider what activities are safe for you. General instructions  Take over-the-counter and prescription medicines only as told by your health care provider.  Avoid certain positions or movements as told by your health care provider.  Drink enough fluid to keep your urine clear or pale yellow.  Keep all follow-up visits as told by your health care provider. This is important. Contact a health care provider if:  Your medicines do not relieve your vertigo or they make it worse.  You have a fever.  Your condition gets worse or you develop new symptoms.  Your family or friends notice any behavioral changes.  Your nausea or vomiting gets worse.  You have numbness or a "pins and needles" sensation in part of your body. Get help right away if:  You have difficulty moving or speaking.  You are always dizzy.  You faint.  You develop severe  headaches.  You have weakness in your hands, arms, or legs.  You have changes in your hearing or vision.  You develop  a stiff neck.  You develop sensitivity to light. This information is not intended to replace advice given to you by your health care provider. Make sure you discuss any questions you have with your health care provider. Document Released: 12/25/2004 Document Revised: 08/29/2015 Document Reviewed: 07/10/2014 Elsevier Interactive Patient Education  2017 [employer].   IF you received an x-[name] today, you will receive an invoice from [facility]. Please contact [facility] at [phone] with questions or concerns regarding your invoice.   IF you received labwork today, you will receive an invoice from [place]. Please contact [facility] at [phone] with questions or concerns regarding your invoice.   Our billing staff will not be able to assist you with questions regarding bills from these companies.  You will be contacted with the lab results as soon as they are available. The fastest way to get your results is to activate your My Chart account. Instructions are located on the last page of this paperwork. If you have not heard from [place] regarding the results in 2 weeks, please contact this office.     I personally performed the services described in this documentation, which was scribed in my presence. The recorded information has been reviewed and considered for accuracy and completeness, addended by me as needed, and agree with information above.  Signed,   Douglas Ray, MD [facility] [place].  05/01/16 11:50 AM

## 2016-05-01 NOTE — Patient Instructions (Addendum)
I will check your hemoglobin A1c and test for sugar in the urine today. If the A1c is still elevated, we may need to start a low-dose medication for diabetes. Work on portion control, avoidance of sugar containing beverages including Sweet tea and continue the good work with exercise. Follow-up with me in 3 months for reassessment of your blood sugar.  Continue Flonase and saline nasal spray for sinus congestion. If you do have another bout of vertigo, I did prescribe meclizine to be taken up to 3 times per day. If those symptoms persist more than a day or 2, or any new symptoms with the vertigo, return here or the emergency room if needed. Please let me know if you have any questions.   Vertigo Vertigo is the feeling that you or your surroundings are moving when they are not. Vertigo can be dangerous if it occurs while you are doing something that could endanger you or others, such as driving. What are the causes? This condition is caused by a disturbance in the signals that are sent by your body's sensory systems to your brain. Different causes of a disturbance can lead to vertigo, including:  Infections, especially in the inner ear.  A bad reaction to a drug, or misuse of alcohol and medicines.  Withdrawal from drugs or alcohol.  Quickly changing positions, as when lying down or rolling over in bed.  Migraine headaches.  Decreased blood flow to the brain.  Decreased blood pressure.  Increased pressure in the brain from a head or neck injury, stroke, infection, tumor, or bleeding.  Central nervous system disorders. What are the signs or symptoms? Symptoms of this condition usually occur when you move your head or your eyes in different directions. Symptoms may start suddenly, and they usually last for less than a minute. Symptoms may include:  Loss of balance and falling.  Feeling like you are spinning or moving.  Feeling like your surroundings are spinning or moving.  Nausea and  vomiting.  Blurred vision or double vision.  Difficulty hearing.  Slurred speech.  Dizziness.  Involuntary eye movement (nystagmus). Symptoms can be mild and cause only slight annoyance, or they can be severe and interfere with daily life. Episodes of vertigo may return (recur) over time, and they are often triggered by certain movements. Symptoms may improve over time. How is this diagnosed? This condition may be diagnosed based on medical history and the quality of your nystagmus. Your health care provider may test your eye movements by asking you to quickly change positions to trigger the nystagmus. This may be called the Dix-Hallpike test, head thrust test, or roll test. You may be referred to a health care provider who specializes in ear, nose, and throat (ENT) problems (otolaryngologist) or a provider who specializes in disorders of the central nervous system (neurologist). You may have additional testing, including:  A physical exam.  Blood tests.  MRI.  A CT scan.  An electrocardiogram (ECG). This records electrical activity in your heart.  An electroencephalogram (EEG). This records electrical activity in your brain.  Hearing tests. How is this treated? Treatment for this condition depends on the cause and the severity of the symptoms. Treatment options include:  Medicines to treat nausea or vertigo. These are usually used for severe cases. Some medicines that are used to treat other conditions may also reduce or eliminate vertigo symptoms. These include:  Medicines that control allergies (antihistamines).  Medicines that control seizures (anticonvulsants).  Medicines that relieve depression (antidepressants).  Medicines that relieve anxiety (sedatives).  Head movements to adjust your inner ear back to normal. If your vertigo is caused by an ear problem, your health care provider may recommend certain movements to correct the problem.  Surgery. This is  rare. Follow these instructions at home: Safety  Move slowly.Avoid sudden body or head movements.  Avoid driving.  Avoid operating heavy machinery.  Avoid doing any tasks that would cause danger to you or others if you would have a vertigo episode during the task.  If you have trouble walking or keeping your balance, try using a cane for stability. If you feel dizzy or unstable, sit down right away.  Return to your normal activities as told by your health care provider. Ask your health care provider what activities are safe for you. General instructions  Take over-the-counter and prescription medicines only as told by your health care provider.  Avoid certain positions or movements as told by your health care provider.  Drink enough fluid to keep your urine clear or pale yellow.  Keep all follow-up visits as told by your health care provider. This is important. Contact a health care provider if:  Your medicines do not relieve your vertigo or they make it worse.  You have a fever.  Your condition gets worse or you develop new symptoms.  Your family or friends notice any behavioral changes.  Your nausea or vomiting gets worse.  You have numbness or a "pins and needles" sensation in part of your body. Get help right away if:  You have difficulty moving or speaking.  You are always dizzy.  You faint.  You develop severe headaches.  You have weakness in your hands, arms, or legs.  You have changes in your hearing or vision.  You develop a stiff neck.  You develop sensitivity to light. This information is not intended to replace advice given to you by your health care provider. Make sure you discuss any questions you have with your health care provider. Document Released: 12/25/2004 Document Revised: 08/29/2015 Document Reviewed: 07/10/2014 Elsevier Interactive Patient Education  2017 [employer].   IF you received an x-ray today, you will receive an invoice  from [facility]. Please contact [facility] at [phone] with questions or concerns regarding your invoice.   IF you received labwork today, you will receive an invoice from [place]. Please contact [facility] at [phone] with questions or concerns regarding your invoice.   Our billing staff will not be able to assist you with questions regarding bills from these companies.  You will be contacted with the lab results as soon as they are available. The fastest way to get your results is to activate your My Chart account. Instructions are located on the last page of this paperwork. If you have not heard from [place] regarding the results in 2 weeks, please contact this office.

## 2016-05-02 LAB — COMPREHENSIVE METABOLIC PANEL
ALK PHOS: 60 IU/L (ref 39–117)
ALT: 25 IU/L (ref 0–44)
AST: 20 IU/L (ref 0–40)
Albumin/Globulin Ratio: 1.2 (ref 1.2–2.2)
Albumin: 3.9 g/dL (ref 3.5–4.8)
BUN/Creatinine Ratio: 9 — ABNORMAL LOW (ref 10–24)
BUN: 9 mg/dL (ref 8–27)
Bilirubin Total: 0.5 mg/dL (ref 0.0–1.2)
CO2: 27 mmol/L (ref 18–29)
CREATININE: 1.02 mg/dL (ref 0.76–1.27)
Calcium: 9.7 mg/dL (ref 8.6–10.2)
Chloride: 104 mmol/L (ref 96–106)
GFR calc Af Amer: 81 mL/min/{1.73_m2} (ref >59)
GFR calc non Af Amer: 70 mL/min/{1.73_m2} (ref >59)
GLUCOSE: 131 mg/dL — AB (ref 65–99)
Globulin, Total: 3.3 g/dL (ref 1.5–4.5)
Potassium: 5.3 mmol/L — ABNORMAL HIGH (ref 3.5–5.2)
SODIUM: 145 mmol/L — AB (ref 134–144)
Total Protein: 7.2 g/dL (ref 6.0–8.5)

## 2016-05-02 LAB — HEMOGLOBIN A1C
Est. average glucose Bld gHb Est-mCnc: 143 mg/dL
HEMOGLOBIN A1C: 6.6 % — AB (ref 4.8–5.6)

## 2016-05-02 LAB — LIPID PANEL
Chol/HDL Ratio: 3.1 ratio units (ref 0.0–5.0)
Cholesterol, Total: 119 mg/dL (ref 100–199)
HDL: 38 mg/dL — ABNORMAL LOW (ref >39)
LDL CALC: 62 mg/dL (ref 0–99)
TRIGLYCERIDES: 93 mg/dL (ref 0–149)
VLDL CHOLESTEROL CAL: 19 mg/dL (ref 5–40)

## 2016-05-02 LAB — MICROALBUMIN, URINE: Microalbumin, Urine: <3 ug/mL

## 2016-05-05 ENCOUNTER — Encounter: Payer: Self-pay | Admitting: Gastroenterology

## 2016-05-05 ENCOUNTER — Ambulatory Visit (INDEPENDENT_AMBULATORY_CARE_PROVIDER_SITE_OTHER): Payer: Medicare Other | Admitting: Gastroenterology

## 2016-05-05 VITALS — BP 140/78 | HR 76 | Ht 71.0 in | Wt 213.2 lb

## 2016-05-05 DIAGNOSIS — R194 Change in bowel habit: Secondary | ICD-10-CM | POA: Diagnosis not present

## 2016-05-05 DIAGNOSIS — K219 Gastro-esophageal reflux disease without esophagitis: Secondary | ICD-10-CM

## 2016-05-05 DIAGNOSIS — Z86010 Personal history of colonic polyps: Secondary | ICD-10-CM | POA: Diagnosis not present

## 2016-05-05 MED ORDER — PANTOPRAZOLE SODIUM 40 MG PO TBEC: 40.0000 mg | DELAYED_RELEASE_TABLET | Freq: Every day | ORAL | 11 refills | Status: DC

## 2016-05-05 MED ORDER — PANTOPRAZOLE SODIUM 40 MG PO TBEC: 40.0000 mg | DELAYED_RELEASE_TABLET | Freq: Every day | ORAL | 3 refills | Status: DC

## 2016-05-05 NOTE — Progress Notes (Signed)
    History of Present Illness: This is a 81 year old male with GERD, erosive gastritis and duodenitis returning for follow-up. He states he has noted loose stools with the use of Prilosec. When he temporarily discontinues Prilosec his epigastric pain and reflux symptoms return however his loose stools stop. He also notes an occasional gurgling or bubbling sensation. He is unsure if this is related to Prilosec.  EGD 01/2015 Mild erosive gastritis in the gastric antrum Mild nonerosive duodenitis in the duodenal bulb The EGD otherwise appeared normal  Colon 01/2015 Eight sessile polyps in the transverse colon, sigmoid colon, at the cecum, and in the ascending colon; polypectomies performed with a cold snare Sessile polyp at the ileocecal valve; polypectomy performed with cold forceps All adenomatous polyps  Current Medications, Allergies, Past Medical History, Past Surgical History, Family History and Social History were reviewed in [employer] record.  Physical Exam: General: Well developed, well nourished, no acute distress Head: Normocephalic and atraumatic Eyes:  sclerae anicteric, EOMI Ears: Normal auditory acuity Mouth: No deformity or lesions Lungs: Clear throughout to auscultation Heart: Regular rate and rhythm; no murmurs, rubs or bruits Abdomen: Soft, non tender and non distended. No masses, hepatosplenomegaly or hernias noted. Normal Bowel sounds Musculoskeletal: Symmetrical with no gross deformities  Pulses:  Normal pulses noted Extremities: No clubbing, cyanosis, edema or deformities noted Neurological: Alert oriented x 4, grossly nonfocal Psychological:  Alert and cooperative. Normal mood and affect  Assessment and Recommendations:  1. Change in bowel habits with loose stools and intermittent dyspepsia. GI side effects from Prilosec. Discontinue Prilosec.  2. GERD, erosive gastritis and duodenitis.  begin pantoprazole 40 mg daily. If any of his  symptoms persist he is to call back otherwise remain on pantoprazole daily. REV in 1 year.   3.  Personal history of 9 adenomatous colon polyps on 01/2015 colonoscopy. He is due for a 2 year interval surveillance colonoscopy in 02/17/2017.

## 2016-05-05 NOTE — Patient Instructions (Signed)
Discontinue your Prilosec.   We have sent the following medications to your pharmacy for you to pick up at your convenience: Protonix.   You will be due for a recall colonoscopy in 01/2017. We will send you a reminder in the mail when it gets closer to that time.  Thank you for choosing me and [facility].  Pricilla Riffle. Dagoberto Ligas., MD., Marval Regal

## 2016-05-09 ENCOUNTER — Other Ambulatory Visit: Payer: Self-pay | Admitting: Family Medicine

## 2016-05-09 DIAGNOSIS — E785 Hyperlipidemia, unspecified: Secondary | ICD-10-CM

## 2016-05-11 NOTE — Addendum Note (Signed)
Addended by: Carlota Raspberry, Freddrick Gladson R on: 05/11/2016 11:01 AM   Modules accepted: Orders

## 2016-07-08 ENCOUNTER — Ambulatory Visit (INDEPENDENT_AMBULATORY_CARE_PROVIDER_SITE_OTHER): Payer: Medicare Other | Admitting: Family Medicine

## 2016-07-08 VITALS — BP 146/67 | HR 73 | Temp 97.8°F | Resp 17 | Ht 72.0 in | Wt 209.0 lb

## 2016-07-08 DIAGNOSIS — E119 Type 2 diabetes mellitus without complications: Secondary | ICD-10-CM

## 2016-07-08 DIAGNOSIS — J019 Acute sinusitis, unspecified: Secondary | ICD-10-CM | POA: Diagnosis not present

## 2016-07-08 DIAGNOSIS — R519 Headache, unspecified: Secondary | ICD-10-CM

## 2016-07-08 DIAGNOSIS — R51 Headache: Secondary | ICD-10-CM | POA: Diagnosis not present

## 2016-07-08 LAB — POCT SEDIMENTATION RATE: POCT SED RATE: 25 mm/h — AB (ref 0–22)

## 2016-07-08 MED ORDER — AMOXICILLIN-POT CLAVULANATE 875-125 MG PO TABS: 1.0000 | ORAL_TABLET | Freq: Two times a day (BID) | ORAL | 0 refills | Status: DC

## 2016-07-08 NOTE — Patient Instructions (Addendum)
We can try Augmentin for possible sinus infection, but if that has not helped your symptoms in the next 2 weeks, would recommend either CT scanning or evaluation with ear nose and throat specialist.  I will check an inflammation test, and if that is significantly elevated, may need to look at other causes of your headache.  Last A1c was overall stable. No change in medications for diabetes for now, recheck A1c in the next 3-4 months as previous level was controlled enough to allow six-month interval. If you have questions in the meantime, let me know.   Return to the clinic or go to the nearest emergency room if any of your symptoms worsen or new symptoms occur.   Sinusitis, Adult Sinusitis is soreness and inflammation of your sinuses. Sinuses are hollow spaces in the bones around your face. Your sinuses are located:  Around your eyes.  In the middle of your forehead.  Behind your nose.  In your cheekbones. Your sinuses and nasal passages are lined with a stringy fluid (mucus). Mucus normally drains out of your sinuses. When your nasal tissues become inflamed or swollen, the mucus can become trapped or blocked so air cannot flow through your sinuses. This allows bacteria, viruses, and funguses to grow, which leads to infection. Sinusitis can develop quickly and last for 7?10 days (acute) or for more than 12 weeks (chronic). Sinusitis often develops after a cold. What are the causes? This condition is caused by anything that creates swelling in the sinuses or stops mucus from draining, including:  Allergies.  Asthma.  Bacterial or viral infection.  Abnormally shaped bones between the nasal passages.  Nasal growths that contain mucus (nasal polyps).  Narrow sinus openings.  Pollutants, such as chemicals or irritants in the air.  A foreign object stuck in the nose.  A fungal infection. This is rare. What increases the risk? The following factors may make you more likely to  develop this condition:  Having allergies or asthma.  Having had a recent cold or respiratory tract infection.  Having structural deformities or blockages in your nose or sinuses.  Having a weak immune system.  Doing a lot of swimming or diving.  Overusing nasal sprays.  Smoking. What are the signs or symptoms? The main symptoms of this condition are pain and a feeling of pressure around the affected sinuses. Other symptoms include:  Upper toothache.  Earache.  Headache.  Bad breath.  Decreased sense of smell and taste.  A cough that may get worse at night.  Fatigue.  Fever.  Thick drainage from your nose. The drainage is often green and it may contain pus (purulent).  Stuffy nose or congestion.  Postnasal drip. This is when extra mucus collects in the throat or back of the nose.  Swelling and warmth over the affected sinuses.  Sore throat.  Sensitivity to light. How is this diagnosed? This condition is diagnosed based on symptoms, a medical history, and a physical exam. To find out if your condition is acute or chronic, your health care provider may:  Look in your nose for signs of nasal polyps.  Tap over the affected sinus to check for signs of infection.  View the inside of your sinuses using an imaging device that has a light attached (endoscope). If your health care provider suspects that you have chronic sinusitis, you may also:  Be tested for allergies.  Have a sample of mucus taken from your nose (nasal culture) and checked for bacteria.  Have  a mucus sample examined to see if your sinusitis is related to an allergy. If your sinusitis does not respond to treatment and it lasts longer than 8 weeks, you may have an MRI or CT scan to check your sinuses. These scans also help to determine how severe your infection is. In rare cases, a bone biopsy may be done to rule out more serious types of fungal sinus disease. How is this treated? Treatment for  sinusitis depends on the cause and whether your condition is chronic or acute. If a virus is causing your sinusitis, your symptoms will go away on their own within 10 days. You may be given medicines to relieve your symptoms, including:  Topical nasal decongestants. They shrink swollen nasal passages and let mucus drain from your sinuses.  Antihistamines. These drugs block inflammation that is triggered by allergies. This can help to ease swelling in your nose and sinuses.  Topical nasal corticosteroids. These are nasal sprays that ease inflammation and swelling in your nose and sinuses.  Nasal saline washes. These rinses can help to get rid of thick mucus in your nose. If your condition is caused by bacteria, you will be given an antibiotic medicine. If your condition is caused by a fungus, you will be given an antifungal medicine. Surgery may be needed to correct underlying conditions, such as narrow nasal passages. Surgery may also be needed to remove polyps. Follow these instructions at home: Medicines   Take, use, or apply over-the-counter and prescription medicines only as told by your health care provider. These may include nasal sprays.  If you were prescribed an antibiotic medicine, take it as told by your health care provider. Do not stop taking the antibiotic even if you start to feel better. Hydrate and Humidify   Drink enough water to keep your urine clear or pale yellow. Staying hydrated will help to thin your mucus.  Use a cool mist humidifier to keep the humidity level in your home above 50%.  Inhale steam for 10-15 minutes, 3-4 times a day or as told by your health care provider. You can do this in the bathroom while a hot shower is running.  Limit your exposure to cool or dry air. Rest   Rest as much as possible.  Sleep with your head raised (elevated).  Make sure to get enough sleep each night. General instructions   Apply a warm, moist washcloth to your face 3-4  times a day or as told by your health care provider. This will help with discomfort.  Wash your hands often with soap and water to reduce your exposure to viruses and other germs. If soap and water are not available, use hand sanitizer.  Do not smoke. Avoid being around people who are smoking (secondhand smoke).  Keep all follow-up visits as told by your health care provider. This is important. Contact a health care provider if:  You have a fever.  Your symptoms get worse.  Your symptoms do not improve within 10 days. Get help right away if:  You have a severe headache.  You have persistent vomiting.  You have pain or swelling around your face or eyes.  You have vision problems.  You develop confusion.  Your neck is stiff.  You have trouble breathing. This information is not intended to replace advice given to you by your health care provider. Make sure you discuss any questions you have with your health care provider. Document Released: 03/17/2005 Document Revised: 11/11/2015 Document Reviewed: 01/10/2015  Elsevier Interactive Patient Education  [employer].      IF you received an x-ray today, you will receive an invoice from [facility]. Please contact [facility] at [phone] with questions or concerns regarding your invoice.   IF you received labwork today, you will receive an invoice from [facility]. Please contact [facility] at [phone] with questions or concerns regarding your invoice.   Our billing staff will not be able to assist you with questions regarding bills from these companies.  You will be contacted with the lab results as soon as they are available. The fastest way to get your results is to activate your My Chart account. Instructions are located on the last page of this paperwork. If you have not heard from [place] regarding the results in 2 weeks, please contact this office.

## 2016-07-08 NOTE — Progress Notes (Addendum)
Subjective:  By signing my name below, I, Essence Howell, attest that this documentation has been prepared under the direction and in the presence of [name], MD Electronically Signed: [name], ED Scribe 07/08/2016 at 8:49 AM.   Patient ID: Douglas [name], male    DOB: 02/04/1936, 81 y.o.   MRN: [id number]  Chief Complaint  Patient presents with  . Diabetes  . Sinusitis   HPI Douglas [name] is a 81 y.o. male who presents to Primary Care at [facility] for sinus issues and DM. Last seen 05/01/16. Diet controlled as of last visit. A1C was 6.6. 2 months ago.   Sinusitis  Pt reports intermittent sinus pressure/pain that has gradually worsened over the past few months. Pt reports associated symptoms of chills, frontal and temporal HAs, nasal congestion and dizziness with bending. He has tried [facility] and Claritin daily without relief. He denies fever, rhinorrhea, postnasal drip, visual disturbances.   Patient Active Problem List   Diagnosis Date Noted  . Bladder outlet obstruction 04/25/2015  . Erectile dysfunction due to arterial insufficiency 04/25/2015  . Osteoporosis 04/25/2015  . Vitamin D deficiency 04/25/2015  . Heme positive stool 03/16/2014  . Abdominal pain, epigastric 03/16/2014  . Prostate cancer ([place]) 05/30/2011  . Dyslipidemia 05/30/2011  . Hypogonadism male 05/30/2011  . Acute sinusitis 01/07/2007  . RHINITIS 08/25/2006   Past Medical History:  Diagnosis Date  . Adenomatous colon polyp 03/2001  . Allergy    SEASONAL  . Cancer ([place])   . Cataract   . GERD (gastroesophageal reflux disease)   . Hyperlipidemia   . Low testosterone    Past Surgical History:  Procedure Laterality Date  . APPENDECTOMY    . COLONOSCOPY    . EYE SURGERY    . POLYPECTOMY    . PROSTATE SURGERY    . SPINE SURGERY     No Known Allergies Prior to Admission medications   Medication Sig Start Date End Date Taking? Authorizing Provider  alfuzosin (UROXATRAL) 10 MG 24 hr tablet   11/09/14   Historical Provider, MD  aspirin 81 MG tablet Take 81 mg by mouth daily.    Historical Provider, MD  Cholecalciferol (VITAMIN D) 2000 UNITS tablet Take 2,000 Units by mouth daily.    Historical Provider, MD  fluticasone (FLONASE) 50 MCG/ACT nasal spray PLACE 2 SPRAYS INTO THE NOSE DAILY. 05/01/16   [name], MD  pantoprazole (PROTONIX) 40 MG tablet Take 1 tablet (40 mg total) by mouth daily. 05/05/16   [name], MD  simvastatin (ZOCOR) 20 MG tablet TAKE 1 TABLET DAILY AT 6PM 05/01/16   [name], MD  simvastatin (ZOCOR) 20 MG tablet 1 tab daily at 6pm 05/10/16   [name], MD  Testosterone 30 MG/ACT SOLN Place 30 g onto the skin.    Historical Provider, MD  vitamin C (ASCORBIC ACID) 500 MG tablet Take 500 mg by mouth daily.    Historical Provider, MD   Social History   Social History  . Marital status: Married    Spouse name: N/A  . Number of children: 3  . Years of education: 2 years [employer]   Occupational History  . RETIRED    Social History Main Topics  . Smoking status: Never Smoker  . Smokeless tobacco: Never Used  . Alcohol use No     Comment: once a year  . Drug use: No  . Sexual activity: Yes   Other Topics Concern  . Not  on file   Social History Narrative   Married. Education: [employer]. Exercise: Yes.   Review of Systems  Constitutional: Positive for chills. Negative for fever.  [name]: Positive for congestion, sinus pain and sinus pressure. Negative for postnasal drip and rhinorrhea.   Eyes: Negative for visual disturbance.  Neurological: Positive for dizziness (with bending) and headaches.      Objective:   Physical Exam  Constitutional: He is oriented to person, place, and time. He appears well-developed and well-nourished.  [name]:  Head: Normocephalic and atraumatic.  Right Ear: Tympanic membrane, external ear and ear canal normal.  Left Ear: Tympanic membrane, external ear and ear canal normal.  Nose: Mucosal edema (of the  turbinates) present. No rhinorrhea. Right sinus exhibits maxillary sinus tenderness and frontal sinus tenderness. Left sinus exhibits maxillary sinus tenderness and frontal sinus tenderness.  Mouth/Throat: Oropharynx is clear and moist and mucous membranes are normal. No oropharyngeal exudate or posterior oropharyngeal erythema.  No septal irritation or bleeding. Slight temporal HA and sinus HA.  Eyes: Conjunctivae are normal. Pupils are equal, round, and reactive to light.  Neck: Neck supple.  Cardiovascular: Normal rate, regular rhythm, normal heart sounds and intact distal pulses.   No murmur heard. Pulmonary/Chest: Effort normal and breath sounds normal. He has no wheezes. He has no rhonchi. He has no rales.  Abdominal: Soft. There is no tenderness.  Lymphadenopathy:    He has no cervical adenopathy.  Neurological: He is alert and oriented to person, place, and time.  Skin: Skin is warm and dry. No rash noted.  Psychiatric: He has a normal mood and affect. His behavior is normal.  Vitals reviewed.   Vitals:   07/08/16 0833  BP: (!) 146/67  Pulse: 73  Resp: 17  Temp: 97.8 F (36.6 C)  TempSrc: Oral  SpO2: 97%  Weight: 209 lb (94.8 kg)  Height: 6' (1.829 m)   Results for orders placed or performed in visit on 07/08/16  POCT SEDIMENTATION RATE  Result Value Ref Range   POCT SED RATE 25 (A) 0 - 22 mm/hr       Assessment & Plan:   Douglas [name] is a 81 y.o. male Subacute sinusitis, unspecified location - Plan: POCT SEDIMENTATION RATE, amoxicillin-clavulanate (AUGMENTIN) 875-125 MG tablet Temporal headache - Plan: POCT SEDIMENTATION RATE  - Potentially chronic sinusitis versus acute on chronic sinusitis, underlying allergic rhinitis, but has been compliant with Flonase and Claritin.  -Start Augmentin, potential side effects discussed. If not improving within 2 weeks, may need CT scanning versus ENT eval.  -Minimal temporal headache symptoms, primarily sinus headache. Will  check sedimentation rate but less likely temporal arteritis. RTC precautions given. Min elevation in ESR, not at range for temporal arteritis - plan as above.   Diabetes mellitus type 2, diet-controlled (HCC)  - Last A1c overall controlled. Continue diet control, follow-up the next 3-4 months for repeat A1c.    Meds ordered this encounter  Medications  . amoxicillin-clavulanate (AUGMENTIN) 875-125 MG tablet    Sig: Take 1 tablet by mouth 2 (two) times daily.    Dispense:  20 tablet    Refill:  0   Patient Instructions    We can try Augmentin for possible sinus infection, but if that has not helped your symptoms in the next 2 weeks, would recommend either CT scanning or evaluation with ear nose and throat specialist.  I will check an inflammation test, and if that is significantly elevated, may need to look at other causes  of your headache.  Last A1c was overall stable. No change in medications for diabetes for now, recheck A1c in the next 3-4 months as previous level was controlled enough to allow six-month interval. If you have questions in the meantime, let me know.   Return to the clinic or go to the nearest emergency room if any of your symptoms worsen or new symptoms occur.   Sinusitis, Adult Sinusitis is soreness and inflammation of your sinuses. Sinuses are hollow spaces in the bones around your face. Your sinuses are located:  Around your eyes.  In the middle of your forehead.  Behind your nose.  In your cheekbones. Your sinuses and nasal passages are lined with a stringy fluid (mucus). Mucus normally drains out of your sinuses. When your nasal tissues become inflamed or swollen, the mucus can become trapped or blocked so air cannot flow through your sinuses. This allows bacteria, viruses, and funguses to grow, which leads to infection. Sinusitis can develop quickly and last for 7?10 days (acute) or for more than 12 weeks (chronic). Sinusitis often develops after a  cold. What are the causes? This condition is caused by anything that creates swelling in the sinuses or stops mucus from draining, including:  Allergies.  Asthma.  Bacterial or viral infection.  Abnormally shaped bones between the nasal passages.  Nasal growths that contain mucus (nasal polyps).  Narrow sinus openings.  Pollutants, such as chemicals or irritants in the air.  A foreign object stuck in the nose.  A fungal infection. This is rare. What increases the risk? The following factors may make you more likely to develop this condition:  Having allergies or asthma.  Having had a recent cold or respiratory tract infection.  Having structural deformities or blockages in your nose or sinuses.  Having a weak immune system.  Doing a lot of swimming or diving.  Overusing nasal sprays.  Smoking. What are the signs or symptoms? The main symptoms of this condition are pain and a feeling of pressure around the affected sinuses. Other symptoms include:  Upper toothache.  Earache.  Headache.  Bad breath.  Decreased sense of smell and taste.  A cough that may get worse at night.  Fatigue.  Fever.  Thick drainage from your nose. The drainage is often green and it may contain pus (purulent).  Stuffy nose or congestion.  Postnasal drip. This is when extra mucus collects in the throat or back of the nose.  Swelling and warmth over the affected sinuses.  Sore throat.  Sensitivity to light. How is this diagnosed? This condition is diagnosed based on symptoms, a medical history, and a physical exam. To find out if your condition is acute or chronic, your health care provider may:  Look in your nose for signs of nasal polyps.  Tap over the affected sinus to check for signs of infection.  View the inside of your sinuses using an imaging device that has a light attached (endoscope). If your health care provider suspects that you have chronic sinusitis, you may  also:  Be tested for allergies.  Have a sample of mucus taken from your nose (nasal culture) and checked for bacteria.  Have a mucus sample examined to see if your sinusitis is related to an allergy. If your sinusitis does not respond to treatment and it lasts longer than 8 weeks, you may have an MRI or CT scan to check your sinuses. These scans also help to determine how severe your infection is. In  rare cases, a bone biopsy may be done to rule out more serious types of fungal sinus disease. How is this treated? Treatment for sinusitis depends on the cause and whether your condition is chronic or acute. If a virus is causing your sinusitis, your symptoms will go away on their own within 10 days. You may be given medicines to relieve your symptoms, including:  Topical nasal decongestants. They shrink swollen nasal passages and let mucus drain from your sinuses.  Antihistamines. These drugs block inflammation that is triggered by allergies. This can help to ease swelling in your nose and sinuses.  Topical nasal corticosteroids. These are nasal sprays that ease inflammation and swelling in your nose and sinuses.  Nasal saline washes. These rinses can help to get rid of thick mucus in your nose. If your condition is caused by bacteria, you will be given an antibiotic medicine. If your condition is caused by a fungus, you will be given an antifungal medicine. Surgery may be needed to correct underlying conditions, such as narrow nasal passages. Surgery may also be needed to remove polyps. Follow these instructions at home: Medicines   Take, use, or apply over-the-counter and prescription medicines only as told by your health care provider. These may include nasal sprays.  If you were prescribed an antibiotic medicine, take it as told by your health care provider. Do not stop taking the antibiotic even if you start to feel better. Hydrate and Humidify   Drink enough water to keep your urine  clear or pale yellow. Staying hydrated will help to thin your mucus.  Use a cool mist humidifier to keep the humidity level in your home above 50%.  Inhale steam for 10-15 minutes, 3-4 times a day or as told by your health care provider. You can do this in the bathroom while a hot shower is running.  Limit your exposure to cool or dry air. Rest   Rest as much as possible.  Sleep with your head raised (elevated).  Make sure to get enough sleep each night. General instructions   Apply a warm, moist washcloth to your face 3-4 times a day or as told by your health care provider. This will help with discomfort.  Wash your hands often with soap and water to reduce your exposure to viruses and other germs. If soap and water are not available, use hand sanitizer.  Do not smoke. Avoid being around people who are smoking (secondhand smoke).  Keep all follow-up visits as told by your health care provider. This is important. Contact a health care provider if:  You have a fever.  Your symptoms get worse.  Your symptoms do not improve within 10 days. Get help right away if:  You have a severe headache.  You have persistent vomiting.  You have pain or swelling around your face or eyes.  You have vision problems.  You develop confusion.  Your neck is stiff.  You have trouble breathing. This information is not intended to replace advice given to you by your health care provider. Make sure you discuss any questions you have with your health care provider. Document Released: 03/17/2005 Document Revised: 11/11/2015 Document Reviewed: 01/10/2015 Elsevier Interactive Patient Education  2017 [employer].      IF you received an x-ray today, you will receive an invoice from [facility]. Please contact [facility] at [phone] with questions or concerns regarding your invoice.   IF you received labwork today, you will receive an invoice from [employer].  Please  contact [facility] at [phone] with questions or concerns regarding your invoice.   Our billing staff will not be able to assist you with questions regarding bills from these companies.  You will be contacted with the lab results as soon as they are available. The fastest way to get your results is to activate your My Chart account. Instructions are located on the last page of this paperwork. If you have not heard from [place] regarding the results in 2 weeks, please contact this office.       I personally performed the services described in this documentation, which was scribed in my presence. The recorded information has been reviewed and considered for accuracy and completeness, addended by me as needed, and agree with information above.  Signed,   [name], MD [facility] at [place].  07/08/16 9:05 AM

## 2016-07-09 DIAGNOSIS — H40021 Open angle with borderline findings, high risk, right eye: Secondary | ICD-10-CM | POA: Diagnosis not present

## 2016-07-09 DIAGNOSIS — H01003 Unspecified blepharitis right eye, unspecified eyelid: Secondary | ICD-10-CM | POA: Diagnosis not present

## 2016-07-09 DIAGNOSIS — H04123 Dry eye syndrome of bilateral lacrimal glands: Secondary | ICD-10-CM | POA: Diagnosis not present

## 2016-07-09 DIAGNOSIS — H1013 Acute atopic conjunctivitis, bilateral: Secondary | ICD-10-CM | POA: Diagnosis not present

## 2016-07-31 ENCOUNTER — Encounter: Payer: Self-pay | Admitting: Family Medicine

## 2016-07-31 ENCOUNTER — Ambulatory Visit (INDEPENDENT_AMBULATORY_CARE_PROVIDER_SITE_OTHER): Payer: Medicare Other | Admitting: Family Medicine

## 2016-07-31 VITALS — BP 168/64 | HR 73 | Temp 97.9°F | Resp 18 | Ht 71.34 in | Wt 206.0 lb

## 2016-07-31 DIAGNOSIS — E875 Hyperkalemia: Secondary | ICD-10-CM

## 2016-07-31 DIAGNOSIS — R42 Dizziness and giddiness: Secondary | ICD-10-CM

## 2016-07-31 DIAGNOSIS — J321 Chronic frontal sinusitis: Secondary | ICD-10-CM | POA: Diagnosis not present

## 2016-07-31 DIAGNOSIS — H9313 Tinnitus, bilateral: Secondary | ICD-10-CM

## 2016-07-31 DIAGNOSIS — E119 Type 2 diabetes mellitus without complications: Secondary | ICD-10-CM | POA: Diagnosis not present

## 2016-07-31 LAB — BASIC METABOLIC PANEL
BUN/Creatinine Ratio: 9 — ABNORMAL LOW (ref 10–24)
BUN: 11 mg/dL (ref 8–27)
CALCIUM: 9.9 mg/dL (ref 8.6–10.2)
CO2: 28 mmol/L (ref 18–29)
CREATININE: 1.16 mg/dL (ref 0.76–1.27)
Chloride: 101 mmol/L (ref 96–106)
GFR calc Af Amer: 69 mL/min/{1.73_m2} (ref >59)
GFR calc non Af Amer: 60 mL/min/{1.73_m2} (ref >59)
Glucose: 117 mg/dL — ABNORMAL HIGH (ref 65–99)
POTASSIUM: 5 mmol/L (ref 3.5–5.2)
Sodium: 140 mmol/L (ref 134–144)

## 2016-07-31 LAB — HEMOGLOBIN A1C
Est. average glucose Bld gHb Est-mCnc: 146 mg/dL
Hgb A1c MFr Bld: 6.7 % — ABNORMAL HIGH (ref 4.8–5.6)

## 2016-07-31 NOTE — Progress Notes (Signed)
By signing my name below, I, Douglas [name], attest that this documentation has been prepared under the direction and in the presence of Douglas Ray, MD.  Electronically Signed: Verlee [name], Medical Scribe. 07/31/16. 10:40 AM.  Subjective:    Patient ID: Douglas [name], male    DOB: Dec 11, 1935, 81 y.o.   MRN: [id number]  HPI Chief Complaint  Patient presents with  . Follow-up    3 month f/u    HPI Comments: Douglas [name] is a 81 y.o. male who presents to Primary Care at [facility] for follow-up.  DM: He is diet controlled. Pt walks daily for an hour. Lab Results  Component Value Date   HGBA1C 6.6 (H) 05/01/2016   Lab Results  Component Value Date   MICROALBUR 0.6 04/30/2015   Wt Readings from Last 3 Encounters:  07/31/16 206 lb (93.4 kg)  07/08/16 209 lb (94.8 kg)  05/05/16 213 lb 3.2 oz (96.7 kg)   HLD: He takes zocor 20 mg QD. Pt is complaint with his medication. Reports non painful ankle edema. Denies experiencing myalgias, or other acute side effects. Denies chest pain or other acute side effects. Lab Results  Component Value Date   CHOL 119 05/01/2016   HDL 38 (L) 05/01/2016   LDLCALC 62 05/01/2016   TRIG 93 05/01/2016   CHOLHDL 3.1 05/01/2016   Lab Results  Component Value Date   ALT 25 05/01/2016   AST 20 05/01/2016   ALKPHOS 60 05/01/2016   BILITOT 0.5 05/01/2016   Hyperkalemia: Borderline, along with borderline sodium on blood work in 05/2016. Planned for lab only visit but has not had this performed.  Prostate CA: He is followed by Dr. [name]. He had surgery in 2006 and radiation therapy. Low testosterone, treated with uroxatral in the past. Stable PSA, planned for 1 year follow-up Dec 2018. Pt is complaint with uroxatral. Lab Results  Component Value Date   PSA 0.14 01/03/2013   PSA 0.13 09/01/2011   Dizziness: Intermittent (1x weekly or days inbetween) and states it feels like the room is spinning onset for a long time. Reports associated  sxs of intermittent light-headedness, tinnitus (onset for a long time), and pressure HA. Occurs when he wakes up and last for a day. He was treated with Augmentin for sinusitis on 07/08/16. Pt has an appt with [facility] next week for his tinnitus. Denies hearing loss.  Patient Active Problem List   Diagnosis Date Noted  . Bladder outlet obstruction 04/25/2015  . Erectile dysfunction due to arterial insufficiency 04/25/2015  . Osteoporosis 04/25/2015  . Vitamin D deficiency 04/25/2015  . Heme positive stool 03/16/2014  . Abdominal pain, epigastric 03/16/2014  . Prostate cancer ([place]) 05/30/2011  . Dyslipidemia 05/30/2011  . Hypogonadism male 05/30/2011  . Acute sinusitis 01/07/2007  . RHINITIS 08/25/2006   Past Medical History:  Diagnosis Date  . Adenomatous colon polyp 03/2001  . Allergy    SEASONAL  . Cancer ([place])   . Cataract   . GERD (gastroesophageal reflux disease)   . Hyperlipidemia   . Low testosterone    Past Surgical History:  Procedure Laterality Date  . APPENDECTOMY    . COLONOSCOPY    . EYE SURGERY    . POLYPECTOMY    . PROSTATE SURGERY    . SPINE SURGERY     No Known Allergies Prior to Admission medications   Medication Sig Start Date End Date Taking? Authorizing Provider  alfuzosin (UROXATRAL) 10 MG 24 hr tablet  11/09/14  Historical Provider, MD  amoxicillin-clavulanate (AUGMENTIN) 875-125 MG tablet Take 1 tablet by mouth 2 (two) times daily. 07/08/16   [name], MD  aspirin 81 MG tablet Take 81 mg by mouth daily.    Historical Provider, MD  Cholecalciferol (VITAMIN D) 2000 UNITS tablet Take 2,000 Units by mouth daily.    Historical Provider, MD  fluticasone (FLONASE) 50 MCG/ACT nasal spray PLACE 2 SPRAYS INTO THE NOSE DAILY. 05/01/16   [name], MD  pantoprazole (PROTONIX) 40 MG tablet Take 1 tablet (40 mg total) by mouth daily. 05/05/16   [name], MD  simvastatin (ZOCOR) 20 MG tablet TAKE 1 TABLET DAILY AT 6PM 05/01/16   [name], MD  simvastatin (ZOCOR) 20 MG tablet 1 tab daily at 6pm 05/10/16   [name], MD  Testosterone 30 MG/ACT SOLN Place 30 g onto the skin.    Historical Provider, MD  vitamin C (ASCORBIC ACID) 500 MG tablet Take 500 mg by mouth daily.    Historical Provider, MD   Social History   Social History  . Marital status: Married    Spouse name: N/A  . Number of children: 3  . Years of education: 2 years [employer]   Occupational History  . RETIRED    Social History Main Topics  . Smoking status: Never Smoker  . Smokeless tobacco: Never Used  . Alcohol use No     Comment: once a year  . Drug use: No  . Sexual activity: Yes   Other Topics Concern  . Not on file   Social History Narrative   Married. Education: [employer]. Exercise: Yes.   Review of Systems  [name]: Positive for tinnitus. Negative for hearing loss.   Neurological: Positive for dizziness, light-headedness and headaches.   Objective:  Physical Exam  Constitutional: He appears well-developed and well-nourished. No distress.  [name]:  Head: Normocephalic and atraumatic.  TM pearly grey Canal clear Slight frontal pressure, no pain  Eyes: Conjunctivae and EOM are normal. Pupils are equal, round, and reactive to light. Right eye exhibits no nystagmus. Left eye exhibits no nystagmus.  Arcus senilis bilaterally  Neck: Neck supple.  Cardiovascular: Normal rate, regular rhythm and normal heart sounds.  Exam reveals no gallop and no friction rub.   No murmur heard. Pulmonary/Chest: Effort normal and breath sounds normal. No respiratory distress. He has no wheezes. He has no rales.  Neurological: He is alert.  Skin: Skin is warm and dry.  Psychiatric: He has a normal mood and affect. His behavior is normal.  Nursing note and vitals reviewed.   Vitals:   07/31/16 1017  BP: (!) 168/64  Pulse: 73  Resp: 18  Temp: 97.9 F (36.6 C)  TempSrc: Oral  SpO2: 96%  Weight: 206 lb (93.4 kg)  Height: 5' 11.34" (1.812 m)   Body  mass index is 28.46 kg/m. Assessment & Plan:  Douglas [name] is a 81 y.o. male Type 2 diabetes mellitus without complication, without long-term current use of insulin ([place]) - Plan: Hemoglobin A1c, Care order/instruction:  - check A1c, commended on efforts with diet and weight loss  Vertigo Tinnitus of both ears Chronic frontal sinusitis  - vertigo vs sinus cause vs menieres.  Keep follow up with ENT  Hyperkalemia - Plan: Basic metabolic panel  - repeat BMP. Mild elevation in BP - monitor out of office.   Continue same statin dose.  Recheck in 6 months.   No orders of the defined types  were placed in this encounter.  Patient Instructions    Keep follow-up with ear nose and throat specialist next week to discuss your congestion in sinuses and frontal headache along with dizzy spells and ringing in the ears as those may be related.  I will recheck your diabetes test, but great work on the diet and exercise as you have had some weight loss. I suspect the numbers will be even better with your efforts.   No change in cholesterol medication for now. I will also check your electrolytes as those were slightly off last visit. Recheck with me in 6 months.   IF you received an x-[name] today, you will receive an invoice from [facility]. Please contact [facility] at [phone] with questions or concerns regarding your invoice.   IF you received labwork today, you will receive an invoice from [place]. Please contact [facility] at [phone] with questions or concerns regarding your invoice.   Our billing staff will not be able to assist you with questions regarding bills from these companies.  You will be contacted with the lab results as soon as they are available. The fastest way to get your results is to activate your My Chart account. Instructions are located on the last page of this paperwork. If you have not heard from [place] regarding the results in 2 weeks, please contact  this office.       I personally performed the services described in this documentation, which was scribed in my presence. The recorded information has been reviewed and considered for accuracy and completeness, addended by me as needed, and agree with information above.  Signed,   Douglas Ray, MD Primary Care at [place].  08/02/16 11:36 PM

## 2016-07-31 NOTE — Patient Instructions (Addendum)
  Keep follow-up with ear nose and throat specialist next week to discuss your congestion in sinuses and frontal headache along with dizzy spells and ringing in the ears as those may be related.  I will recheck your diabetes test, but great work on the diet and exercise as you have had some weight loss. I suspect the numbers will be even better with your efforts.   No change in cholesterol medication for now. I will also check your electrolytes as those were slightly off last visit. Recheck with me in 6 months.   IF you received an x-ray today, you will receive an invoice from [facility]. Please contact [facility] at [phone] with questions or concerns regarding your invoice.   IF you received labwork today, you will receive an invoice from [place]. Please contact [facility] at [phone] with questions or concerns regarding your invoice.   Our billing staff will not be able to assist you with questions regarding bills from these companies.  You will be contacted with the lab results as soon as they are available. The fastest way to get your results is to activate your My Chart account. Instructions are located on the last page of this paperwork. If you have not heard from [place] regarding the results in 2 weeks, please contact this office.

## 2016-08-07 DIAGNOSIS — H903 Sensorineural hearing loss, bilateral: Secondary | ICD-10-CM | POA: Diagnosis not present

## 2016-08-07 DIAGNOSIS — J310 Chronic rhinitis: Secondary | ICD-10-CM | POA: Diagnosis not present

## 2016-08-07 DIAGNOSIS — H8112 Benign paroxysmal vertigo, left ear: Secondary | ICD-10-CM | POA: Diagnosis not present

## 2016-08-07 DIAGNOSIS — H9313 Tinnitus, bilateral: Secondary | ICD-10-CM | POA: Diagnosis not present

## 2016-08-07 DIAGNOSIS — H9113 Presbycusis, bilateral: Secondary | ICD-10-CM | POA: Diagnosis not present

## 2016-08-07 DIAGNOSIS — H833X3 Noise effects on inner ear, bilateral: Secondary | ICD-10-CM | POA: Diagnosis not present

## 2016-09-02 ENCOUNTER — Ambulatory Visit (INDEPENDENT_AMBULATORY_CARE_PROVIDER_SITE_OTHER): Payer: Medicare Other | Admitting: Family Medicine

## 2016-09-02 ENCOUNTER — Encounter: Payer: Self-pay | Admitting: Family Medicine

## 2016-09-02 ENCOUNTER — Ambulatory Visit (INDEPENDENT_AMBULATORY_CARE_PROVIDER_SITE_OTHER): Payer: Medicare Other

## 2016-09-02 VITALS — BP 112/60 | HR 73 | Temp 98.6°F | Resp 16 | Ht 71.34 in | Wt 210.4 lb

## 2016-09-02 DIAGNOSIS — M25471 Effusion, right ankle: Secondary | ICD-10-CM | POA: Diagnosis not present

## 2016-09-02 DIAGNOSIS — M19071 Primary osteoarthritis, right ankle and foot: Secondary | ICD-10-CM

## 2016-09-02 DIAGNOSIS — M79671 Pain in right foot: Secondary | ICD-10-CM | POA: Diagnosis not present

## 2016-09-02 MED ORDER — MELOXICAM 7.5 MG PO TABS: 7.5000 mg | ORAL_TABLET | Freq: Every day | ORAL | 0 refills | Status: AC

## 2016-09-02 NOTE — Progress Notes (Signed)
Douglas [name] is a 81 y.o. male who presents to Primary Care at [facility] today for  Chief Complaint  Patient presents with  . Ankle Pain    right ankle swelling x 3 months, soaking it doesn't help, tingling/numbness on bottom of foot     1. Right Ankle swelling:  Patient notes that for the last couple months he began to have right ankle swelling and then developed some pain about 1 month ago. Patient was sitting on the cough and his wife pointed it out. He sometimes feels some pressure under the ball of his foot, sometimes around the ankle too. Sometimes he gets burning/tingling on the top of his right foot. Denies any history of trauma. He has never had swelling in the area before. No changes in medications. It sometimes hurts to walk. Sometimes he will feel some pain with walking up steps. The burning/tingling feeling is worse when laying down. Symptoms have no worsened in intensity. Has been trying epsom salt and warm water soaks without relief.   ROS as above.  Pertinently, no chest pain, palpitations, SOB, Fever, Chills, Abd pain, N/V/D.   PMH reviewed. Patient is a nonsmoker.   Past Medical History:  Diagnosis Date  . Adenomatous colon polyp 03/2001  . Allergy    SEASONAL  . Cancer ([place])   . Cataract   . GERD (gastroesophageal reflux disease)   . Hyperlipidemia   . Low testosterone    Past Surgical History:  Procedure Laterality Date  . APPENDECTOMY    . COLONOSCOPY    . EYE SURGERY    . POLYPECTOMY    . PROSTATE SURGERY    . SPINE SURGERY      Medications reviewed. Current Outpatient Prescriptions  Medication Sig Dispense Refill  . alfuzosin (UROXATRAL) 10 MG 24 hr tablet     . aspirin 81 MG tablet Take 81 mg by mouth daily.    . Cholecalciferol (VITAMIN D) 2000 UNITS tablet Take 2,000 Units by mouth daily.    . fluticasone (FLONASE) 50 MCG/ACT nasal spray PLACE 2 SPRAYS INTO THE NOSE DAILY. 48 g 1  . pantoprazole (PROTONIX) 40 MG tablet Take 1 tablet (40 mg total) by  mouth daily. 90 tablet 3  . simvastatin (ZOCOR) 20 MG tablet TAKE 1 TABLET DAILY AT 6PM 90 tablet 1  . Testosterone 30 MG/ACT SOLN Place 30 g onto the skin.    Marland Kitchen vitamin C (ASCORBIC ACID) 500 MG tablet Take 500 mg by mouth daily.     No current facility-administered medications for this visit.      Physical Exam:  BP 112/60 (BP Location: Right Arm, Patient Position: Sitting, Cuff Size: Large)   Pulse 73   Temp 98.6 F (37 C) (Oral)   Resp 16   Ht 5' 11.34" (1.812 m)   Wt 210 lb 6.4 oz (95.4 kg)   SpO2 98%   BMI 29.07 kg/m  Gen:  Alert, cooperative patient who appears stated age in no acute distress.  Vital signs reviewed. Pulm: Normal work of breathing  MSK:  Left Ankle & Foot: No visible swelling, ecchymosis, erythema, ulcers, calluses, blister Arch: Pes planus  Toes: no claw or hammer deformity  Full range of motion in plantarflexion, dorsiflexion, inversion, and eversion of the foot; flexion and extension of the toes Strength: 5/5 in all directions. Sensation: intact Vascular: intact w/ dorsalis pedis & posterior tibialis pulses 2+ Stable lateral and medial ligaments; Negative Anterior drawer test Negative tarsal tunnel tinel's  Right Ankle & Foot: No ecchymosis, erythema, ulcers, calluses, blister. 1+ pitting edema on lateral and medial ankle.  Arch: Pes planus  Toes: no claw or hammer deformity  Tenderness to palpation on posterior aspect of medial malleolus   Full range of motion in plantarflexion, dorsiflexion, inversion, and eversion of the foot; flexion and extension of the toes Strength: 5/5 in all directions. Sensation: intact Vascular: intact w/ dorsalis pedis & posterior tibialis pulses 2+ Stable lateral and medial ligaments; Negative Anterior drawer test Negative tarsal tunnel tinel's     Diabetic Foot Exam - Simple   Simple Foot Form Diabetic Foot exam was performed with the following findings:  Yes 09/02/2016  3:32 PM  Visual Inspection No  deformities, no ulcerations, no other skin breakdown bilaterally:  Yes Sensation Testing Intact to touch and monofilament testing bilaterally:  Yes Pulse Check Posterior Tibialis and Dorsalis pulse intact bilaterally:  Yes Comments     Assessment and Plan:  1.  Right Ankle Swelling and pain: Has been occurring for the last couple months. No trauma. Associated with tingling on the dorsal aspect of the foot which I suspect is secondary to the ankle swelling. Physical exam showing swelling on medial and lateral aspect of ankle associated with some tenderness to palpation. XRAYs showing not acute abnormalities but just some signs of osteoarthritis. No infectious signs. Discussed testing for gout but patient not agreeable. The plan is as follows: - Will try 7.5 mg Mobic daily x 2 weeks - Continue to wear supportive shoes with some cushion  - Will return to clinic if symptoms persist or fail to improve as anticipated - Consider referral to orthopedic specialist in the future   Smitty Cords, MD [place], PGY-2

## 2016-09-02 NOTE — Patient Instructions (Addendum)
Thank you for coming in today, it was so nice to see you! Today we talked about:    Right ankle swelling and pain: Your XRAY showed no signs of fracture or bone problems. It did show some arthritis. We recommend taking Mobic daily for the next couple weeks which is an anti inflammatory medication. Continue wearing supportive comfortable shoes.   Please follow up in 2 weeks if your swelling and pain doesn't improve.   Sincerely,  Smitty Cords, MD    IF you received an x-ray today, you will receive an invoice from [facility]. Please contact [facility] at [phone] with questions or concerns regarding your invoice.   IF you received labwork today, you will receive an invoice from [place]. Please contact [facility] at [phone] with questions or concerns regarding your invoice.   Our billing staff will not be able to assist you with questions regarding bills from these companies.  You will be contacted with the lab results as soon as they are available. The fastest way to get your results is to activate your My Chart account. Instructions are located on the last page of this paperwork. If you have not heard from [place] regarding the results in 2 weeks, please contact this office.

## 2016-09-03 NOTE — Progress Notes (Signed)
Patient discussed with Dr. Juanito Doom. Agree with findings, assessment and plan of care per her note. Although he does have symptoms of pes planus, unlikely primary cause of pain and swelling in his age. Also unlikely gout at his age, but could consider uric acid testing if persistent symptoms. Based on x-ray results and ankle swelling, degenerative changes possible with secondary dysesthesias from swelling. Agree with trial of meloxicam temporarily, would avoid long-term use with history of diabetes and potential cardiac risks. Recheck in few weeks, consider orthopedic evaluation or advanced imaging if not improving.

## 2016-10-17 IMAGING — CR DG CHEST 2V
2 series · 2 of 2 positions shown · non-contrast
Comparison: 09/25/2011

CLINICAL DATA: Cough and chest congestion for 5 days.

EXAM:
CHEST  2 VIEW

[PA]
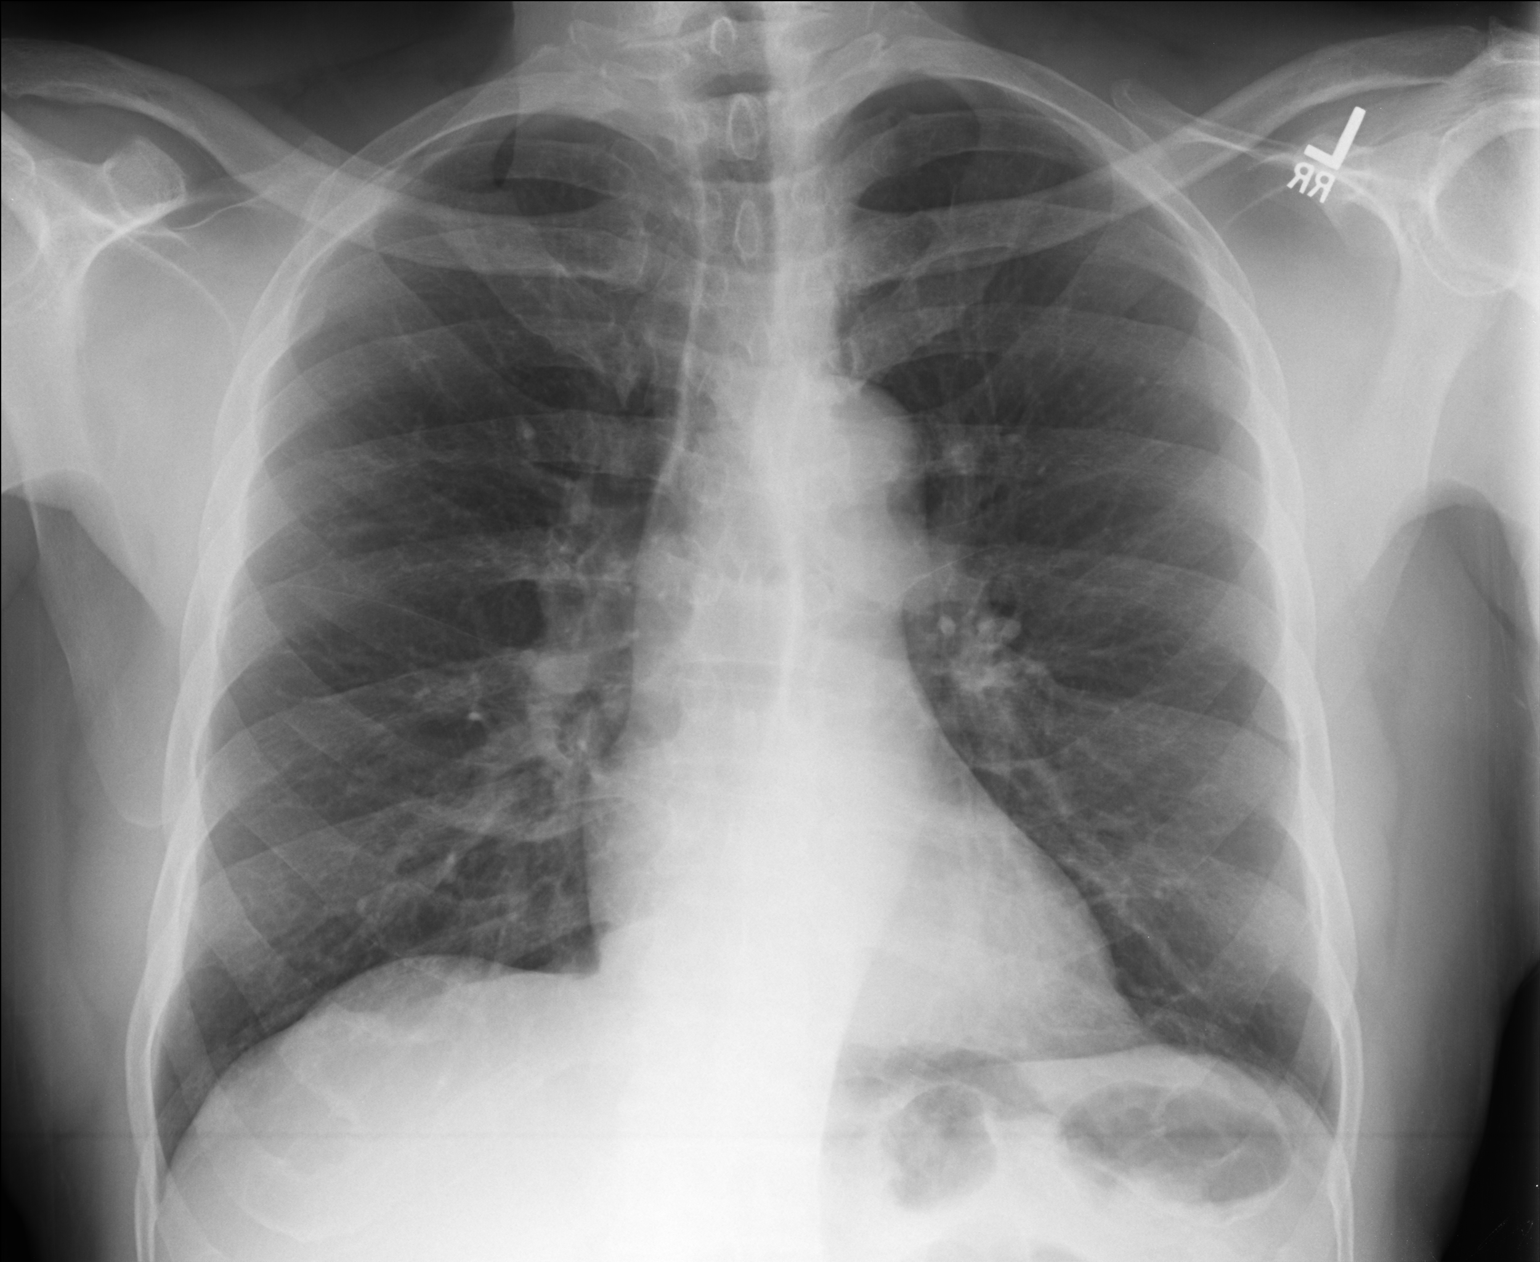

[lateral]
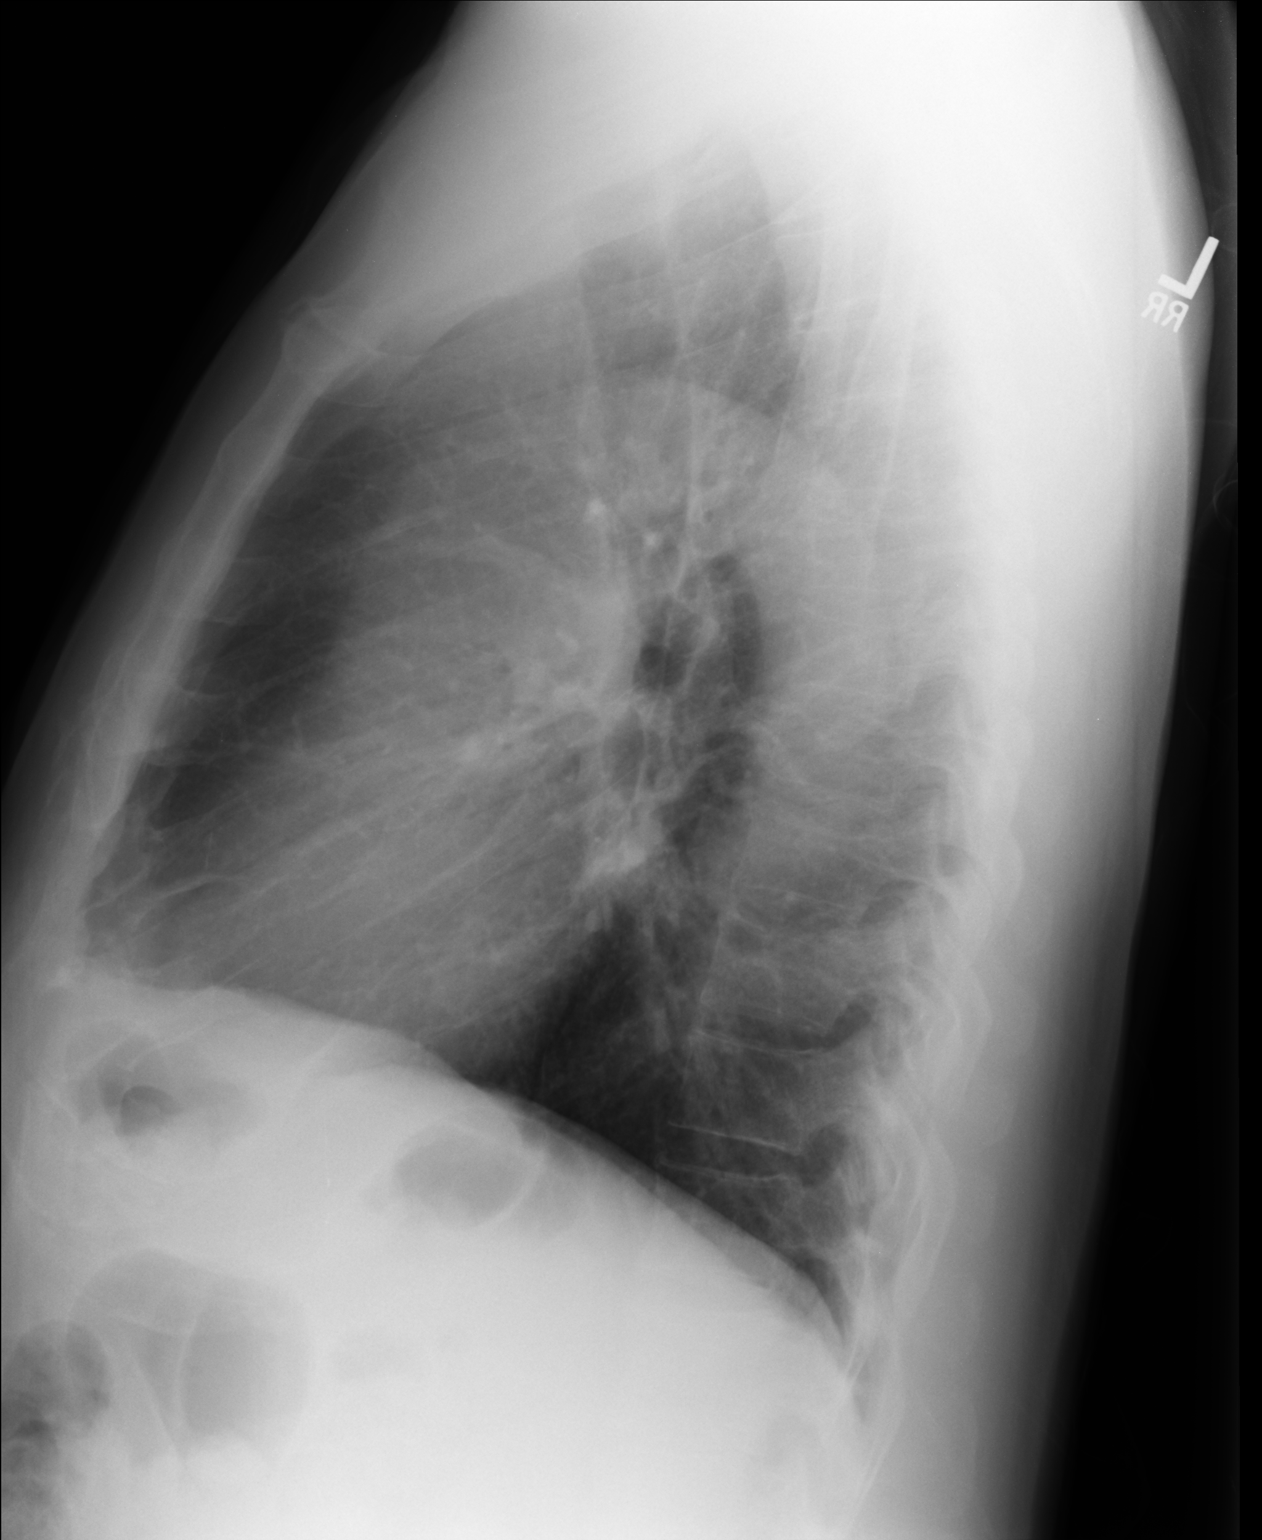

[2 of 2 positions shown; findings below may reference images not displayed]

FINDINGS: The heart size and mediastinal contours are within normal limits.
Both lungs are clear. The visualized skeletal structures are
unremarkable.
IMPRESSION: No active cardiopulmonary disease.

## 2016-10-27 DIAGNOSIS — E291 Testicular hypofunction: Secondary | ICD-10-CM | POA: Diagnosis not present

## 2016-10-27 DIAGNOSIS — C61 Malignant neoplasm of prostate: Secondary | ICD-10-CM | POA: Diagnosis not present

## 2016-10-27 DIAGNOSIS — R972 Elevated prostate specific antigen [PSA]: Secondary | ICD-10-CM | POA: Diagnosis not present

## 2016-11-12 ENCOUNTER — Ambulatory Visit: Payer: Medicare Other

## 2016-11-14 ENCOUNTER — Ambulatory Visit (INDEPENDENT_AMBULATORY_CARE_PROVIDER_SITE_OTHER): Payer: Medicare Other

## 2016-11-14 VITALS — BP 154/65 | HR 77 | Temp 98.0°F | Ht 72.0 in | Wt 203.0 lb

## 2016-11-14 DIAGNOSIS — Z0000 Encounter for general adult medical examination without abnormal findings: Secondary | ICD-10-CM | POA: Diagnosis not present

## 2016-11-14 NOTE — Progress Notes (Signed)
Subjective:   Douglas [name] is a 81 y.o. male who presents for Medicare Annual/Subsequent preventive examination.  Review of Systems:  N/A Cardiac Risk Factors include: advanced age (>31men, >73 women);dyslipidemia;male gender     Objective:    Vitals: BP (!) 154/65   Pulse 77   Temp 98 F (36.7 C) (Oral)   Ht 6' (1.829 m)   Wt 203 lb (92.1 kg)   BMI 27.53 kg/m   Body mass index is 27.53 kg/m.  Tobacco History  Smoking Status  . Never Smoker  Smokeless Tobacco  . Never Used     Counseling given: Not Answered   Past Medical History:  Diagnosis Date  . Adenomatous colon polyp 03/2001  . Allergy    SEASONAL  . Cancer ([place])   . Cataract   . GERD (gastroesophageal reflux disease)   . Hyperlipidemia   . Low testosterone    Past Surgical History:  Procedure Laterality Date  . APPENDECTOMY    . COLONOSCOPY    . EYE SURGERY    . POLYPECTOMY    . PROSTATE SURGERY    . SPINE SURGERY     Family History  Problem Relation Age of Onset  . Cancer Father 24       pancreatic  . Prostate cancer Brother   . Colon cancer Neg Hx    History  Sexual Activity  . Sexual activity: Yes    Outpatient Encounter Prescriptions as of 11/14/2016  Medication Sig  . alfuzosin (UROXATRAL) 10 MG 24 hr tablet   . aspirin 81 MG tablet Take 81 mg by mouth daily.  . Cholecalciferol (VITAMIN D) 2000 UNITS tablet Take 2,000 Units by mouth daily.  . fluticasone (FLONASE) 50 MCG/ACT nasal spray PLACE 2 SPRAYS INTO THE NOSE DAILY.  . pantoprazole (PROTONIX) 40 MG tablet Take 1 tablet (40 mg total) by mouth daily.  . simvastatin (ZOCOR) 20 MG tablet TAKE 1 TABLET DAILY AT 6PM  . Testosterone 30 MG/ACT SOLN Place 30 g onto the skin.  Marland Kitchen vitamin C (ASCORBIC ACID) 500 MG tablet Take 500 mg by mouth daily.   No facility-administered encounter medications on file as of 11/14/2016.     Activities of Daily Living In your present state of health, do you have any difficulty performing the  following activities: 11/14/2016  Hearing? N  Vision? Y  Comment Only at night time while driving, he has limited driving at night due to this   Difficulty concentrating or making decisions? N  Walking or climbing stairs? N  Dressing or bathing? N  Doing errands, shopping? N  Preparing Food and eating ? N  Using the Toilet? N  In the past six months, have you accidently leaked urine? N  Do you have problems with loss of bowel control? N  Managing your Medications? N  Managing your Finances? N  Housekeeping or managing your Housekeeping? N  Some recent data might be hidden    Patient Care Team: [name], MD as PCP - General (Family Medicine) Monna Fam, MD as Consulting Physician (Ophthalmology) Carolan Clines, MD as Consulting Physician (Urology)   Assessment:     Exercise Activities and Dietary recommendations Current Exercise Habits: Structured exercise class, Type of exercise: treadmill;Other - see comments (exercise bike ), Time (Minutes): 60, Frequency (Times/Week): 7, Weekly Exercise (Minutes/Week): 420, Intensity: Moderate  Goals    . Eat more fruits and vegetables          Patient eats plenty of vegetables  but will try to eat more fruit daily       Fall Risk Fall Risk  11/14/2016 09/02/2016 07/31/2016 05/01/2016 10/25/2015  Falls in the past year? No No No No No   Depression Screen PHQ 2/9 Scores 11/14/2016 09/02/2016 07/31/2016 05/01/2016  PHQ - 2 Score 0 0 0 0    Cognitive Function     6CIT Screen 11/14/2016  What Year? 0 points  What month? 0 points  What time? 0 points  Count back from 20 0 points  Months in reverse 2 points  Repeat phrase 0 points  Total Score 2    Immunization History  Administered Date(s) Administered  . Influenza Split 12/30/2010, 12/25/2011  . Influenza,inj,Quad PF,36+ Mos 12/07/2012, 12/13/2013, 12/06/2014, 12/07/2015  . Pneumococcal Conjugate-13 04/24/2014  . Pneumococcal Polysaccharide-23 05/31/2001, 06/08/2009  .  Td 06/12/2008  . Zoster 03/31/2008   Screening Tests Health Maintenance  Topic Date Due  . INFLUENZA VACCINE  10/29/2016  . OPHTHALMOLOGY EXAM  11/14/2017 (Originally 07/03/2016)  . HEMOGLOBIN A1C  01/31/2017  . COLONOSCOPY  02/08/2017  . URINE MICROALBUMIN  05/01/2017  . FOOT EXAM  09/02/2017  . TETANUS/TDAP  06/13/2018  . PNA vac Low Risk Adult  Completed      Plan:  I have personally reviewed and addressed the Medicare Annual Wellness questionnaire and have noted the following in the patient's chart:  A. Medical and social history B. Use of alcohol, tobacco or illicit drugs  C. Current medications and supplements D. Functional ability and status E.  Nutritional status F.  Physical activity G. Advance directives H. List of other physicians I.  Hospitalizations, surgeries, and ER visits in previous 12 months J.  [place] such as hearing and vision if needed, cognitive and depression L. Referrals and appointments - none  In addition, I have reviewed and discussed with patient certain preventive protocols, quality metrics, and best practice recommendations. A written personalized care plan for preventive services as well as general preventive health recommendations were provided to patient.    Signed,  Andrez Grime, LPN Nurse Health Advisor   MD Recommendations: none

## 2016-11-14 NOTE — Patient Instructions (Addendum)
Douglas [name] , Thank you for taking time to come for your Medicare Wellness Visit. I appreciate your ongoing commitment to your health goals. Please review the following plan we discussed and let me know if I can assist you in the future.   Screening recommendations/referrals: Colonoscopy: completed 02/09/2015 Recommended yearly ophthalmology/optometry visit for glaucoma screening and checkup Recommended yearly dental visit for hygiene and checkup  Vaccinations: Influenza vaccine: due 11/2016 Pneumococcal vaccine: completed series Tdap vaccine: completed 06/12/2008 Shingles vaccine: completed 03/31/2008  Advanced directives: Advance directive discussed with you today. Even though you declined this today please call our office should you change your mind and we can give you the proper paperwork for you to fill out.  Conditions/risks identified: Patient eats plenty of vegetables but will try to eat more fruit daily  Next appointment: 02/05/2017 @ 10 am  Preventive Care 65 Years and Older, Male Preventive care refers to lifestyle choices and visits with your health care provider that can promote health and wellness. What does preventive care include?  A yearly physical exam. This is also called an annual well check.  Dental exams once or twice a year.  Routine eye exams. Ask your health care provider how often you should have your eyes checked.  Personal lifestyle choices, including:  Daily care of your teeth and gums.  Regular physical activity.  Eating a healthy diet.  Avoiding tobacco and drug use.  Limiting alcohol use.  Practicing safe sex.  Taking low doses of aspirin every day.  Taking vitamin and mineral supplements as recommended by your health care provider. What happens during an annual well check? The services and screenings done by your health care provider during your annual well check will depend on your age, overall health, lifestyle risk factors, and family  history of disease. Counseling  Your health care provider may ask you questions about your:  Alcohol use.  Tobacco use.  Drug use.  Emotional well-being.  Home and relationship well-being.  Sexual activity.  Eating habits.  History of falls.  Memory and ability to understand (cognition).  Work and work Statistician. Screening  You may have the following tests or measurements:  Height, weight, and BMI.  Blood pressure.  Lipid and cholesterol levels. These may be checked every 5 years, or more frequently if you are over 73 years old.  Skin check.  Lung cancer screening. You may have this screening every year starting at age 53 if you have a 30-pack-year history of smoking and currently smoke or have quit within the past 15 years.  Fecal occult blood test (FOBT) of the stool. You may have this test every year starting at age 96.  Flexible sigmoidoscopy or colonoscopy. You may have a sigmoidoscopy every 5 years or a colonoscopy every 10 years starting at age 57.  Prostate cancer screening. Recommendations will vary depending on your family history and other risks.  Hepatitis C blood test.  Hepatitis B blood test.  Sexually transmitted disease (STD) testing.  Diabetes screening. This is done by checking your blood sugar (glucose) after you have not eaten for a while (fasting). You may have this done every 1-3 years.  Abdominal aortic aneurysm (AAA) screening. You may need this if you are a current or former smoker.  Osteoporosis. You may be screened starting at age 26 if you are at high risk. Talk with your health care provider about your test results, treatment options, and if necessary, the need for more tests. Vaccines  Your health care  provider may recommend certain vaccines, such as:  Influenza vaccine. This is recommended every year.  Tetanus, diphtheria, and acellular pertussis (Tdap, Td) vaccine. You may need a Td booster every 10 years.  Zoster vaccine.  You may need this after age 77.  Pneumococcal 13-valent conjugate (PCV13) vaccine. One dose is recommended after age 46.  Pneumococcal polysaccharide (PPSV23) vaccine. One dose is recommended after age 21. Talk to your health care provider about which screenings and vaccines you need and how often you need them. This information is not intended to replace advice given to you by your health care provider. Make sure you discuss any questions you have with your health care provider. Document Released: 04/13/2015 Document Revised: 12/05/2015 Document Reviewed: 01/16/2015 Elsevier Interactive Patient Education  2017 [place] Prevention in the Home Falls can cause injuries. They can happen to people of all ages. There are many things you can do to make your home safe and to help prevent falls. What can I do on the outside of my home?  Regularly fix the edges of walkways and driveways and fix any cracks.  Remove anything that might make you trip as you walk through a door, such as a raised step or threshold.  Trim any bushes or trees on the path to your home.  Use bright outdoor lighting.  Clear any walking paths of anything that might make someone trip, such as rocks or tools.  Regularly check to see if handrails are loose or broken. Make sure that both sides of any steps have handrails.  Any raised decks and porches should have guardrails on the edges.  Have any leaves, snow, or ice cleared regularly.  Use sand or salt on walking paths during winter.  Clean up any spills in your garage right away. This includes oil or grease spills. What can I do in the bathroom?  Use night lights.  Install grab bars by the toilet and in the tub and shower. Do not use towel bars as grab bars.  Use non-skid mats or decals in the tub or shower.  If you need to sit down in the shower, use a plastic, non-slip stool.  Keep the floor dry. Clean up any water that spills on the floor as soon  as it happens.  Remove soap buildup in the tub or shower regularly.  Attach bath mats securely with double-sided non-slip rug tape.  Do not have throw rugs and other things on the floor that can make you trip. What can I do in the bedroom?  Use night lights.  Make sure that you have a light by your bed that is easy to reach.  Do not use any sheets or blankets that are too big for your bed. They should not hang down onto the floor.  Have a firm chair that has side arms. You can use this for support while you get dressed.  Do not have throw rugs and other things on the floor that can make you trip. What can I do in the kitchen?  Clean up any spills right away.  Avoid walking on wet floors.  Keep items that you use a lot in easy-to-reach places.  If you need to reach something above you, use a strong step stool that has a grab bar.  Keep electrical cords out of the way.  Do not use floor polish or wax that makes floors slippery. If you must use wax, use non-skid floor wax.  Do not have throw  rugs and other things on the floor that can make you trip. What can I do with my stairs?  Do not leave any items on the stairs.  Make sure that there are handrails on both sides of the stairs and use them. Fix handrails that are broken or loose. Make sure that handrails are as long as the stairways.  Check any carpeting to make sure that it is firmly attached to the stairs. Fix any carpet that is loose or worn.  Avoid having throw rugs at the top or bottom of the stairs. If you do have throw rugs, attach them to the floor with carpet tape.  Make sure that you have a light switch at the top of the stairs and the bottom of the stairs. If you do not have them, ask someone to add them for you. What else can I do to help prevent falls?  Wear shoes that:  Do not have high heels.  Have rubber bottoms.  Are comfortable and fit you well.  Are closed at the toe. Do not wear sandals.  If  you use a stepladder:  Make sure that it is fully opened. Do not climb a closed stepladder.  Make sure that both sides of the stepladder are locked into place.  Ask someone to hold it for you, if possible.  Clearly mark and make sure that you can see:  Any grab bars or handrails.  First and last steps.  Where the edge of each step is.  Use tools that help you move around (mobility aids) if they are needed. These include:  Canes.  Walkers.  Scooters.  Crutches.  Turn on the lights when you go into a dark area. Replace any light bulbs as soon as they burn out.  Set up your furniture so you have a clear path. Avoid moving your furniture around.  If any of your floors are uneven, fix them.  If there are any pets around you, be aware of where they are.  Review your medicines with your doctor. Some medicines can make you feel dizzy. This can increase your chance of falling. Ask your doctor what other things that you can do to help prevent falls. This information is not intended to replace advice given to you by your health care provider. Make sure you discuss any questions you have with your health care provider. Document Released: 01/11/2009 Document Revised: 08/23/2015 Document Reviewed: 04/21/2014 Elsevier Interactive Patient Education  2017 [employer].

## 2016-11-20 DIAGNOSIS — H40052 Ocular hypertension, left eye: Secondary | ICD-10-CM | POA: Diagnosis not present

## 2016-11-20 DIAGNOSIS — H40021 Open angle with borderline findings, high risk, right eye: Secondary | ICD-10-CM | POA: Diagnosis not present

## 2016-11-20 DIAGNOSIS — H43813 Vitreous degeneration, bilateral: Secondary | ICD-10-CM | POA: Diagnosis not present

## 2016-11-20 DIAGNOSIS — Z961 Presence of intraocular lens: Secondary | ICD-10-CM | POA: Diagnosis not present

## 2016-12-05 DIAGNOSIS — J342 Deviated nasal septum: Secondary | ICD-10-CM | POA: Diagnosis not present

## 2016-12-05 DIAGNOSIS — R42 Dizziness and giddiness: Secondary | ICD-10-CM | POA: Diagnosis not present

## 2016-12-05 DIAGNOSIS — J019 Acute sinusitis, unspecified: Secondary | ICD-10-CM | POA: Diagnosis not present

## 2016-12-05 DIAGNOSIS — R51 Headache: Secondary | ICD-10-CM | POA: Diagnosis not present

## 2016-12-05 DIAGNOSIS — J310 Chronic rhinitis: Secondary | ICD-10-CM | POA: Diagnosis not present

## 2016-12-15 ENCOUNTER — Encounter: Payer: Self-pay | Admitting: Neurology

## 2016-12-15 ENCOUNTER — Ambulatory Visit (INDEPENDENT_AMBULATORY_CARE_PROVIDER_SITE_OTHER): Payer: Medicare Other | Admitting: Neurology

## 2016-12-15 VITALS — BP 157/77 | HR 71 | Ht 72.0 in | Wt 203.0 lb

## 2016-12-15 DIAGNOSIS — R42 Dizziness and giddiness: Secondary | ICD-10-CM | POA: Diagnosis not present

## 2016-12-15 DIAGNOSIS — R51 Headache: Secondary | ICD-10-CM

## 2016-12-15 DIAGNOSIS — H81399 Other peripheral vertigo, unspecified ear: Secondary | ICD-10-CM | POA: Diagnosis not present

## 2016-12-15 DIAGNOSIS — R519 Headache, unspecified: Secondary | ICD-10-CM | POA: Insufficient documentation

## 2016-12-15 NOTE — Progress Notes (Signed)
PATIENT: Douglas [name] DOB: October 07, 1935  Chief Complaint  Patient presents with  . Headache  . Dizziness  . ENT    Jodi Marble, MD  . PCP    [name], MD     HISTORICAL  Douglas [name] is a 81 year old male, seen in refer by his ENT Dr. Erik Obey for evaluation of headache, dizziness,  His primary care physician is Dr. [name]. Initial evaluation was on December 15 2016  I reviewed and summarized the referring note from his ENT Dr. Mare Loan, he was evaluated for pressure in his forehead, vertigo, CT of the paranasal sinus severe left worse septal deviation, but no evidence of active sinusitis.  He reported a history of headaches since 2015, pressure at bilateral frontal region, occasionally with associated spinning sensation, improved after lying down resting for a while, no hearing loss, no tinnitus, recurrent every 3-4 months, lasting for a few hours  He frequently feel stuffy sensation in his nose, use nasal spray, take meclizine as needed, without significant improvement,  Laboratory evaluation in May 2018 showed A1c was mildly elevated at 6.7, BMP showed mild elevated glucose 117, ESR was 25, fasting lipid profile showed LDL 62, total cholesterol is 119   REVIEW OF SYSTEMS: Full 14 system review of systems performed and notable only for swelling in legs, spinning sensation  ALLERGIES: No Known Allergies  HOME MEDICATIONS: Current Outpatient Prescriptions  Medication Sig Dispense Refill  . alfuzosin (UROXATRAL) 10 MG 24 hr tablet     . aspirin 81 MG tablet Take 81 mg by mouth daily.    . Cholecalciferol (VITAMIN D) 2000 UNITS tablet Take 2,000 Units by mouth daily.    . fluticasone (FLONASE) 50 MCG/ACT nasal spray PLACE 2 SPRAYS INTO THE NOSE DAILY. 48 g 1  . pantoprazole (PROTONIX) 40 MG tablet Take 1 tablet (40 mg total) by mouth daily. 90 tablet 3  . simvastatin (ZOCOR) 20 MG tablet TAKE 1 TABLET DAILY AT 6PM 90 tablet 1  . Testosterone 30 MG/ACT  SOLN Place 30 g onto the skin.    Marland Kitchen vitamin C (ASCORBIC ACID) 500 MG tablet Take 500 mg by mouth daily.     No current facility-administered medications for this visit.     PAST MEDICAL HISTORY: Past Medical History:  Diagnosis Date  . Adenomatous colon polyp 03/2001  . Allergy    SEASONAL  . Cancer ([place])   . Cataract   . GERD (gastroesophageal reflux disease)   . Hyperlipidemia   . Low testosterone     PAST SURGICAL HISTORY: Past Surgical History:  Procedure Laterality Date  . APPENDECTOMY    . COLONOSCOPY    . EYE SURGERY    . POLYPECTOMY    . PROSTATE SURGERY    . SPINE SURGERY      FAMILY HISTORY: Family History  Problem Relation Age of Onset  . Cancer Father 56       pancreatic  . Prostate cancer Brother   . Colon cancer Neg Hx     SOCIAL HISTORY:  Social History   Social History  . Marital status: Married    Spouse name: N/A  . Number of children: 3  . Years of education: 2 years [employer]   Occupational History  . RETIRED    Social History Main Topics  . Smoking status: Never Smoker  . Smokeless tobacco: Never Used  . Alcohol use No  . Drug use: No  . Sexual activity: Yes   Other  Topics Concern  . Not on file   Social History Narrative   Married. Education: [employer]. Exercise: Yes.     PHYSICAL EXAM   There were no vitals filed for this visit.  Not recorded      There is no height or weight on file to calculate BMI.  PHYSICAL EXAMNIATION:  Gen: NAD, conversant, well nourised, obese, well groomed                     Cardiovascular: Regular rate rhythm, no peripheral edema, warm, nontender. Eyes: Conjunctivae clear without exudates or hemorrhage Neck: Supple, no carotid bruits. Pulmonary: Clear to auscultation bilaterally   NEUROLOGICAL EXAM:  MENTAL STATUS: Speech:    Speech is normal; fluent and spontaneous with normal comprehension.  Cognition:     Orientation to time, place and person     Normal recent and remote memory      Normal Attention span and concentration     Normal Language, naming, repeating,spontaneous speech     Fund of knowledge   CRANIAL NERVES: CN II: Visual fields are full to confrontation. Fundoscopic exam is normal with sharp discs and no vascular changes. Pupils are round equal and briskly reactive to light. CN III, IV, VI: extraocular movement are normal. No ptosis. CN V: Facial sensation is intact to pinprick in all 3 divisions bilaterally. Corneal responses are intact.  CN VII: Face is symmetric with normal eye closure and smile. CN VIII: Hearing is normal to rubbing fingers CN IX, X: Palate elevates symmetrically. Phonation is normal. CN XI: Head turning and shoulder shrug are intact CN XII: Tongue is midline with normal movements and no atrophy.  MOTOR: There is no pronator drift of out-stretched arms. Muscle bulk and tone are normal. Muscle strength is normal.  REFLEXES: Reflexes are 2+ and symmetric at the biceps, triceps, knees, and ankles. Plantar responses are flexor.  SENSORY: Intact to light touch, pinprick, positional sensation and vibratory sensation are intact in fingers and toes.  COORDINATION: Rapid alternating movements and fine finger movements are intact. There is no dysmetria on finger-to-nose and heel-knee-shin.    GAIT/STANCE: Posture is normal. Gait is steady with normal steps, base, arm swing, and turning. Heel and toe walking are normal. Tandem gait is normal.  Romberg is absent.   DIAGNOSTIC DATA (LABS, IMAGING, TESTING) - I reviewed patient records, labs, notes, testing and imaging myself where available.   ASSESSMENT AND PLAN  Douglas [name] is a 81 y.o. male   New onset headaches, dizziness  MRI of the brain to rule out structural lesion  C-reactive protein ESR to rule out temporal arteritis  NSAIDs as needed    Marcial Pacas, M.D. Ph.D.  [facility] [address] [place], [place] [address] Ph: [phone] Fax:  [phone]  CC: [name], MD

## 2016-12-16 ENCOUNTER — Telehealth: Payer: Self-pay | Admitting: Neurology

## 2016-12-16 ENCOUNTER — Encounter: Payer: Self-pay | Admitting: *Deleted

## 2016-12-16 DIAGNOSIS — E538 Deficiency of other specified B group vitamins: Secondary | ICD-10-CM

## 2016-12-16 LAB — VITAMIN B12: Vitamin B-12: 185 pg/mL — ABNORMAL LOW (ref 232–1245)

## 2016-12-16 LAB — ANA W/REFLEX IF POSITIVE: ANA: NEGATIVE

## 2016-12-16 LAB — C-REACTIVE PROTEIN: CRP: 0.5 mg/L (ref 0.0–4.9)

## 2016-12-16 LAB — RPR: RPR Ser Ql: NONREACTIVE

## 2016-12-16 LAB — TSH: TSH: 0.921 u[IU]/mL (ref 0.450–4.500)

## 2016-12-16 LAB — SEDIMENTATION RATE: Sed Rate: 8 mm/hr (ref 0–30)

## 2016-12-16 NOTE — Telephone Encounter (Addendum)
Spoke to patient - he is aware of results.  He would like his injections to be set up at his PCP office, Dr. [name].  Dr. Rhea Belton recommendations for repeat labs and injection schedule have been faxed and confirmed to Dr. Vonna Kotyk office.  The patient is aware to expect a call from them for scheduling.  1) Repeat B12 labs 2) B12 1054mcg daily x one week 3) B12 1031mcg weekly x one month 4) B12 1020mcg monthly x one year

## 2016-12-16 NOTE — Telephone Encounter (Signed)
Please call patient  Laboratory evaluation showed evidence of vitamin B12 deficiency, level was 185. I have put orders in for repeat B12, he also need B12 IM supplement afterwards, can be done in your office of primary care doctor, 1000 g daily for one week, then weekly for one month and monthly

## 2016-12-18 ENCOUNTER — Ambulatory Visit (INDEPENDENT_AMBULATORY_CARE_PROVIDER_SITE_OTHER): Payer: Medicare Other | Admitting: Family Medicine

## 2016-12-18 VITALS — Temp 98.2°F | Wt 203.2 lb

## 2016-12-18 DIAGNOSIS — Z23 Encounter for immunization: Secondary | ICD-10-CM | POA: Diagnosis not present

## 2016-12-24 ENCOUNTER — Telehealth: Payer: Self-pay | Admitting: Family Medicine

## 2016-12-24 MED ORDER — CYANOCOBALAMIN 1000 MCG/ML IJ SOLN: 1000.0000 ug | Freq: Once | INTRAMUSCULAR | 0 refills | Status: DC

## 2016-12-24 MED ORDER — CYANOCOBALAMIN 1000 MCG/ML IJ SOLN: 1000.0000 ug | Freq: Every day | INTRAMUSCULAR | 0 refills | Status: DC

## 2016-12-24 NOTE — Telephone Encounter (Signed)
Note reviewed from Dr. Krista Blue:  1) Repeat B12 labs 2) B12 1054mcg daily x one week 3) B12 1049mcg weekly x one month 4) B12 1048mcg monthly x one year  Please verify with her office whether she wanted to wait on repeat B12 to start treatment or to go ahead and start treatment now. If she wants him to start treatment now, I placed initial order, then can change to weekly dosing next week for 1 month, followed by monthly dosing.  Can prescribe new dose next week.

## 2016-12-24 NOTE — Telephone Encounter (Signed)
Please advise 

## 2016-12-24 NOTE — Telephone Encounter (Signed)
THIS MESSAGE IS FROM [facility] FOR DR. Carlota Raspberry: THEY RECEIVED 2 PRESCRIPTIONS FOR CYANOCOBALAMIN (VITAMIN B12) 1000 MCG/ML INJECTION. THEY NEED CLARIFICATION ON THE DIRECTIONS. ONE SAYS 2 INJECTIONS ONCE AND THE OTHER SAYS INJECT DAILY. BEST PHONE [phone] ([facility] [place]) [place]

## 2016-12-26 ENCOUNTER — Telehealth: Payer: Self-pay | Admitting: *Deleted

## 2016-12-26 NOTE — Telephone Encounter (Signed)
Spoke with Antony Haste, Pharmacist at [facility] [phone], correction is for pt to have B 12 injection 10ml everyday x 1 week.

## 2016-12-26 NOTE — Telephone Encounter (Signed)
Pharmacist has a question regarding rx for B12 directions unclear.[facility]. [phone]

## 2016-12-26 NOTE — Telephone Encounter (Signed)
LMOM pt was in question as to when he will get blood work for B12 level. After starting injections order has been  placed to recheck.

## 2016-12-26 NOTE — Telephone Encounter (Signed)
Please clarify prescription question. Also see telephone note from neurology. Plan was for him to receive B12 injection once per day for 1 week.  Can place verbal orders that needs to be prescribed differently, and he can have those injections provided here if needed.

## 2016-12-27 ENCOUNTER — Ambulatory Visit
Admission: RE | Admit: 2016-12-27 | Discharge: 2016-12-27 | Disposition: A | Discharge status: Home / Self Care | Payer: Medicare Other | Source: Ambulatory Visit | Attending: Neurology | Admitting: Neurology

## 2016-12-27 ENCOUNTER — Encounter: Payer: Self-pay | Admitting: Physician Assistant

## 2016-12-27 ENCOUNTER — Ambulatory Visit (INDEPENDENT_AMBULATORY_CARE_PROVIDER_SITE_OTHER): Payer: Medicare Other | Admitting: Physician Assistant

## 2016-12-27 VITALS — BP 146/58 | HR 63 | Temp 98.1°F | Resp 16 | Ht 71.0 in | Wt 202.0 lb

## 2016-12-27 DIAGNOSIS — H81399 Other peripheral vertigo, unspecified ear: Secondary | ICD-10-CM | POA: Diagnosis not present

## 2016-12-27 DIAGNOSIS — R42 Dizziness and giddiness: Secondary | ICD-10-CM | POA: Diagnosis not present

## 2016-12-27 DIAGNOSIS — E538 Deficiency of other specified B group vitamins: Secondary | ICD-10-CM

## 2016-12-27 MED ORDER — CYANOCOBALAMIN 1000 MCG/ML IJ SOLN
1000.0000 ug | INTRAMUSCULAR | Status: AC
  Administered 2017-01-03 – 2017-01-26 (×4): 1000 ug via INTRAMUSCULAR

## 2016-12-27 MED ORDER — CYANOCOBALAMIN 1000 MCG/ML IJ SOLN
1000.0000 ug | Freq: Every day | INTRAMUSCULAR | Status: AC
  Administered 2016-12-27 – 2017-01-02 (×6): 1000 ug via INTRAMUSCULAR

## 2016-12-27 NOTE — Patient Instructions (Signed)
     IF you received an x-ray today, you will receive an invoice from [facility]. Please contact  Radiology at [phone] with questions or concerns regarding your invoice.   IF you received labwork today, you will receive an invoice from [facility]. Please contact [facility] at [phone] with questions or concerns regarding your invoice.   Our billing staff will not be able to assist you with questions regarding bills from these companies.  You will be contacted with the lab results as soon as they are available. The fastest way to get your results is to activate your My Chart account. Instructions are located on the last page of this paperwork. If you have not heard from us regarding the results in 2 weeks, please contact this office.     

## 2016-12-27 NOTE — Progress Notes (Signed)
Douglas [name]  MRN: [id number] DOB: 1935-11-11  PCP: [name], MD  Chief Complaint  Patient presents with  . Injections    B 12    Subjective:  Pt presents to clinic for initiation of Vit B 12 injections for his Vit B 12 deficiency.  Note reviewed from Dr. Krista Blue - plan for Vit B injections- he plans to get these here at this office.  1) Repeat B12 labs - done in lab results 2) B12 1066mcg daily x one week - 3) B12 103mcg weekly x one month - 4) B12 1047mcg monthly x one year -   History is obtained by patient.  Review of Systems  Patient Active Problem List   Diagnosis Date Noted  . B12 deficiency 12/16/2016  . Dizziness 12/15/2016  . Nonintractable headache 12/15/2016  . Bladder outlet obstruction 04/25/2015  . Erectile dysfunction due to arterial insufficiency 04/25/2015  . Osteoporosis 04/25/2015  . Vitamin D deficiency 04/25/2015  . Heme positive stool 03/16/2014  . Abdominal pain, epigastric 03/16/2014  . Prostate cancer ([place]) 05/30/2011  . Dyslipidemia 05/30/2011  . Hypogonadism male 05/30/2011  . Acute sinusitis 01/07/2007  . RHINITIS 08/25/2006    Current Outpatient Prescriptions on File Prior to Visit  Medication Sig Dispense Refill  . alfuzosin (UROXATRAL) 10 MG 24 hr tablet     . aspirin 81 MG tablet Take 81 mg by mouth daily.    . Cholecalciferol (VITAMIN D) 2000 UNITS tablet Take 2,000 Units by mouth daily.    . cyanocobalamin (,VITAMIN B-12,) 1000 MCG/ML injection Inject 1 mL (1,000 mcg total) into the muscle daily. 7 mL 0  . pantoprazole (PROTONIX) 40 MG tablet Take 1 tablet (40 mg total) by mouth daily. 90 tablet 3  . simvastatin (ZOCOR) 20 MG tablet TAKE 1 TABLET DAILY AT 6PM 90 tablet 1  . Testosterone 30 MG/ACT SOLN Place 30 g onto the skin.    Marland Kitchen vitamin C (ASCORBIC ACID) 500 MG tablet Take 500 mg by mouth daily.    . fluticasone (FLONASE) 50 MCG/ACT nasal spray PLACE 2 SPRAYS INTO THE NOSE DAILY. (Patient not taking: Reported on  12/27/2016) 48 g 1   No current facility-administered medications on file prior to visit.     No Known Allergies  Past Medical History:  Diagnosis Date  . Adenomatous colon polyp 03/2001  . Allergy    SEASONAL  . Cancer ([place])   . Cataract   . Dizziness   . GERD (gastroesophageal reflux disease)   . Hyperlipidemia   . Low testosterone    Social History   Social History Narrative   Married. Education: [employer]. Exercise: Yes.   Right-handed.   Drinks coffee daily.   Lives at home with his wife.   Social History  Substance Use Topics  . Smoking status: Never Smoker  . Smokeless tobacco: Never Used  . Alcohol use No   family history includes Cancer (age of onset: 52) in his father; Other in his mother; Prostate cancer in his brother.     Objective:  BP (!) 146/58 (BP Location: Right Arm, Patient Position: Sitting, Cuff Size: Large)   Pulse 63   Temp 98.1 F (36.7 C) (Oral)   Resp 16   Ht 5\' 11"  (1.803 m)   Wt 202 lb (91.6 kg)   SpO2 98%   BMI 28.17 kg/m  Body mass index is 28.17 kg/m.  Physical Exam  Constitutional: He is oriented to person, place, and time and well-developed, well-nourished,  and in no distress.  [name]:  Head: Normocephalic and atraumatic.  Right Ear: External ear normal.  Left Ear: External ear normal.  Eyes: Conjunctivae are normal.  Neck: Normal range of motion.  Pulmonary/Chest: Effort normal.  Neurological: He is alert and oriented to person, place, and time. Gait normal.  Skin: Skin is warm and dry.  Psychiatric: Mood, memory, affect and judgment normal.    Assessment and Plan :  Vitamin B 12 deficiency - Plan: cyanocobalamin ((VITAMIN B-12)) injection 1,000 mcg, cyanocobalamin ((VITAMIN B-12)) injection 1,000 mcg    B12 1033mcg daily x one week - ordered today - will get through next Sat 10/6  B12 1038mcg weekly x one month -- ordered today - for 10/7-10/31 for 4 total injections B12 1069mcg monthly x one year - will be ordered at  his visit with Dr Carlota Raspberry on 11/8  Pt was instructed that he will just have to present to clinic for injection only at the front desk and will not have to see a provider until his appt on 11/8 with his PCP  Windell Hummingbird PA-C  [facility] 12/27/2016 9:45 AM

## 2016-12-29 ENCOUNTER — Ambulatory Visit (INDEPENDENT_AMBULATORY_CARE_PROVIDER_SITE_OTHER): Payer: Medicare Other | Admitting: Urgent Care

## 2016-12-29 DIAGNOSIS — E538 Deficiency of other specified B group vitamins: Secondary | ICD-10-CM

## 2016-12-29 NOTE — Progress Notes (Signed)
Patient was seen in the office for a fast-track vitamin B-12 injection.  Patient has future orders in EPIC.    Patient received injection in his left arm(deltoid).  He tolerated the injection well with no indications.  Patient was released with no interactions from injection/medication.

## 2016-12-30 ENCOUNTER — Ambulatory Visit (INDEPENDENT_AMBULATORY_CARE_PROVIDER_SITE_OTHER): Payer: Medicare Other | Admitting: Physician Assistant

## 2016-12-30 ENCOUNTER — Ambulatory Visit: Payer: Medicare Other | Admitting: Physician Assistant

## 2016-12-30 DIAGNOSIS — E538 Deficiency of other specified B group vitamins: Secondary | ICD-10-CM

## 2016-12-30 DIAGNOSIS — E539 Vitamin B deficiency, unspecified: Secondary | ICD-10-CM

## 2016-12-31 ENCOUNTER — Ambulatory Visit (INDEPENDENT_AMBULATORY_CARE_PROVIDER_SITE_OTHER): Payer: Medicare Other | Admitting: Physician Assistant

## 2016-12-31 ENCOUNTER — Encounter: Payer: Self-pay | Admitting: Physician Assistant

## 2016-12-31 VITALS — Temp 98.4°F

## 2016-12-31 DIAGNOSIS — E538 Deficiency of other specified B group vitamins: Secondary | ICD-10-CM

## 2016-12-31 NOTE — Patient Instructions (Addendum)
Pt here for B 12 injection only. Given 1 ml in right deltoid.

## 2017-01-01 ENCOUNTER — Ambulatory Visit (INDEPENDENT_AMBULATORY_CARE_PROVIDER_SITE_OTHER): Payer: Medicare Other | Admitting: Family Medicine

## 2017-01-01 DIAGNOSIS — E538 Deficiency of other specified B group vitamins: Secondary | ICD-10-CM | POA: Diagnosis not present

## 2017-01-02 ENCOUNTER — Ambulatory Visit (INDEPENDENT_AMBULATORY_CARE_PROVIDER_SITE_OTHER): Payer: Medicare Other | Admitting: Urgent Care

## 2017-01-02 ENCOUNTER — Telehealth: Payer: Self-pay | Admitting: Physician Assistant

## 2017-01-02 DIAGNOSIS — E538 Deficiency of other specified B group vitamins: Secondary | ICD-10-CM

## 2017-01-02 NOTE — Progress Notes (Signed)
b12 injection 

## 2017-01-02 NOTE — Telephone Encounter (Signed)
The 10/2 and the 10/3 are listed as nurse visits already. So im not sure what you are asking me to do with changing them to clinical support?

## 2017-01-02 NOTE — Telephone Encounter (Signed)
12/30/2016 and 12/31/2016 are both b12 injections only, which is a request for sarah's patient.  The others are clinical support.  Can we change this?

## 2017-01-03 ENCOUNTER — Ambulatory Visit (INDEPENDENT_AMBULATORY_CARE_PROVIDER_SITE_OTHER): Payer: Medicare Other | Admitting: Physician Assistant

## 2017-01-03 DIAGNOSIS — E538 Deficiency of other specified B group vitamins: Secondary | ICD-10-CM | POA: Diagnosis not present

## 2017-01-03 NOTE — Progress Notes (Signed)
I do not see this patient

## 2017-01-12 ENCOUNTER — Ambulatory Visit (INDEPENDENT_AMBULATORY_CARE_PROVIDER_SITE_OTHER): Payer: Medicare Other | Admitting: Family Medicine

## 2017-01-12 DIAGNOSIS — E538 Deficiency of other specified B group vitamins: Secondary | ICD-10-CM | POA: Diagnosis not present

## 2017-01-12 NOTE — Progress Notes (Signed)
B12 injection as below.

## 2017-01-12 NOTE — Progress Notes (Signed)
Patient was seen for his routine Vitamin B12 injection. He received the injection in his right arm (deltoid).  He has NKDA and did not experience any issues after the injection.  Patient tolerated injection well.    Injection was given by Nadara Mustard, CMA on 01/13/27 at 1 pm at the [facility] [address].

## 2017-01-19 ENCOUNTER — Other Ambulatory Visit: Payer: Self-pay | Admitting: Family Medicine

## 2017-01-19 ENCOUNTER — Ambulatory Visit (INDEPENDENT_AMBULATORY_CARE_PROVIDER_SITE_OTHER): Payer: Medicare Other | Admitting: Physician Assistant

## 2017-01-19 DIAGNOSIS — E785 Hyperlipidemia, unspecified: Secondary | ICD-10-CM

## 2017-01-19 DIAGNOSIS — E538 Deficiency of other specified B group vitamins: Secondary | ICD-10-CM | POA: Diagnosis not present

## 2017-01-26 ENCOUNTER — Ambulatory Visit (INDEPENDENT_AMBULATORY_CARE_PROVIDER_SITE_OTHER): Payer: Medicare Other | Admitting: Physician Assistant

## 2017-01-26 DIAGNOSIS — Z5321 Procedure and treatment not carried out due to patient leaving prior to being seen by health care provider: Secondary | ICD-10-CM

## 2017-01-26 DIAGNOSIS — E538 Deficiency of other specified B group vitamins: Secondary | ICD-10-CM | POA: Diagnosis not present

## 2017-02-05 ENCOUNTER — Other Ambulatory Visit: Payer: Self-pay

## 2017-02-05 ENCOUNTER — Encounter: Payer: Self-pay | Admitting: Family Medicine

## 2017-02-05 ENCOUNTER — Ambulatory Visit (INDEPENDENT_AMBULATORY_CARE_PROVIDER_SITE_OTHER): Payer: Medicare Other | Admitting: Family Medicine

## 2017-02-05 VITALS — BP 112/68 | HR 77 | Temp 98.3°F | Resp 16 | Ht 71.0 in | Wt 199.0 lb

## 2017-02-05 DIAGNOSIS — E538 Deficiency of other specified B group vitamins: Secondary | ICD-10-CM

## 2017-02-05 DIAGNOSIS — E785 Hyperlipidemia, unspecified: Secondary | ICD-10-CM | POA: Diagnosis not present

## 2017-02-05 DIAGNOSIS — E119 Type 2 diabetes mellitus without complications: Secondary | ICD-10-CM | POA: Diagnosis not present

## 2017-02-05 DIAGNOSIS — Z8546 Personal history of malignant neoplasm of prostate: Secondary | ICD-10-CM | POA: Diagnosis not present

## 2017-02-05 DIAGNOSIS — E291 Testicular hypofunction: Secondary | ICD-10-CM | POA: Diagnosis not present

## 2017-02-05 MED ORDER — CYANOCOBALAMIN 1000 MCG/ML IJ SOLN: 1000.0000 ug | INTRAMUSCULAR | Status: DC

## 2017-02-05 NOTE — Patient Instructions (Addendum)
B12 injection today, keep follow up with Dr. Krista Blue to discuss repeat injection and further monitoring. B12 testing was not covered today, so may need to be checked with Dr. Krista Blue at upcoming visit.   I will check diabetes test to determine if meds are needed.   I will check testosterone levels today, but would recommend discussing refills of your Axiron with urology.   Follow up in 3 months for now. Let me know if there are any questions in the meantime. Thanks for coming in today.    IF you received an x-ray today, you will receive an invoice from [facility]. Please contact [facility] at [phone] with questions or concerns regarding your invoice.   IF you received labwork today, you will receive an invoice from [place]. Please contact [facility] at [phone] with questions or concerns regarding your invoice.   Our billing staff will not be able to assist you with questions regarding bills from these companies.  You will be contacted with the lab results as soon as they are available. The fastest way to get your results is to activate your My Chart account. Instructions are located on the last page of this paperwork. If you have not heard from [place] regarding the results in 2 weeks, please contact this office.

## 2017-02-05 NOTE — Progress Notes (Signed)
Subjective:  This chart was scribed for [name], MD by Tamsen Roers, at [place] [facility].  This patient was seen in room 12 and the patient's care was started at 10:40 AM.   Chief Complaint  Patient presents with  . Follow-up    48mo - pt states A1C check     Patient ID: Douglas [name], male    DOB: 18-Nov-1935, 81 y.o.   MRN: [id number]  HPI HPI Comments: Douglas [name] is a 81 y.o. male who presents to Primary Care at [facility] for a follow up.    Diabetes: Diet controlled, he was exercising with walking daily for an hour when discussed at last visit. ----- Patient states that he "stays thirsty", craves sugar, and has to drink water frequently.   Lab Results  Component Value Date   HGBA1C 6.7 (H) 07/31/2016  Urine micro albumin was  less than 3 on February 1st.  Wt Readings from Last 3 Encounters:  02/05/17 199 lb (90.3 kg)  12/27/16 202 lb (91.6 kg)  12/18/16 203 lb 3.2 oz (92.2 kg)    B 12 deficiency:  He saw Dr. Krista Blue in September with initial B 12 level of 185.  Plan based on September 18 th notes from Neuro. Supplement with B12 1000 micrograms IM daily for one week followed by weekly for one month, followed by monthly for one year.  Most recent injection 01/26/17. ----- Patient will be getting his third- weekly dose- of B12 today. He will be seeing Dr. Krista Blue on December 8th.     History of low testosterone and history of prostate cancer: Patient is followed by [facility] for his history of prostate cancer and will be going again in January.   He had surgery March 2006 with radiation therapy and seed implants.  He was given Axiron for low testosterone at that visit- four pumps per day.-- he has been off of this for about 6 months.    Hyperlipidemia: He takes Zocor 20 mg QD. ----- Patient is compliant with this medication and denies any side effects.  Lab Results  Component Value Date   CHOL 119 05/01/2016   HDL 38 (L) 05/01/2016   LDLCALC 62  05/01/2016   TRIG 93 05/01/2016   CHOLHDL 3.1 05/01/2016   Lab Results  Component Value Date   ALT 25 05/01/2016   AST 20 05/01/2016   ALKPHOS 60 05/01/2016   BILITOT 0.5 05/01/2016    GERD: Takes Protonix 40 mg QD.   Patient Active Problem List   Diagnosis Date Noted  . B12 deficiency 12/16/2016  . Dizziness 12/15/2016  . Nonintractable headache 12/15/2016  . Bladder outlet obstruction 04/25/2015  . Erectile dysfunction due to arterial insufficiency 04/25/2015  . Osteoporosis 04/25/2015  . Vitamin D deficiency 04/25/2015  . Heme positive stool 03/16/2014  . Abdominal pain, epigastric 03/16/2014  . Prostate cancer ([place]) 05/30/2011  . Dyslipidemia 05/30/2011  . Hypogonadism male 05/30/2011  . Acute sinusitis 01/07/2007  . RHINITIS 08/25/2006   Past Medical History:  Diagnosis Date  . Adenomatous colon polyp 03/2001  . Allergy    SEASONAL  . Cancer ([place])   . Cataract   . Dizziness   . GERD (gastroesophageal reflux disease)   . Hyperlipidemia   . Low testosterone    Past Surgical History:  Procedure Laterality Date  . APPENDECTOMY    . COLONOSCOPY    . EYE SURGERY    . POLYPECTOMY    . PROSTATE SURGERY    .  SPINE SURGERY     No Known Allergies Prior to Admission medications   Medication Sig Start Date End Date Taking? Authorizing Provider  alfuzosin (UROXATRAL) 10 MG 24 hr tablet  11/09/14   [provider]  aspirin 81 MG tablet Take 81 mg by mouth daily.    [provider]  Cholecalciferol (VITAMIN D) 2000 UNITS tablet Take 2,000 Units by mouth daily.    [provider]  cyanocobalamin (,VITAMIN B-12,) 1000 MCG/ML injection Inject 1 mL (1,000 mcg total) into the muscle daily. 12/24/16   [name], MD  fluticasone (FLONASE) 50 MCG/ACT nasal spray PLACE 2 SPRAYS INTO THE NOSE DAILY. Patient not taking: Reported on 12/27/2016 05/01/16   [name], MD  pantoprazole (PROTONIX) 40 MG tablet Take 1 tablet (40 mg total) by mouth  daily. 05/05/16   [name], MD  simvastatin (ZOCOR) 20 MG tablet TAKE 1 TABLET DAILY AT 6PM 05/01/16   [name], MD  simvastatin (ZOCOR) 20 MG tablet TAKE 1 TABLET DAILY AT [facility] 01/20/17   [name], MD  Testosterone 30 MG/ACT SOLN Place 30 g onto the skin.    [provider]  vitamin C (ASCORBIC ACID) 500 MG tablet Take 500 mg by mouth daily.    [provider]   Social History   Socioeconomic History  . Marital status: Married    Spouse name: Not on file  . Number of children: 3  . Years of education: 2 years [employer]  . Highest education level: Not on file  Social Needs  . Financial resource strain: Not on file  . Food insecurity - worry: Not on file  . Food insecurity - inability: Not on file  . Transportation needs - medical: Not on file  . Transportation needs - non-medical: Not on file  Occupational History  . Occupation: RETIRED  Tobacco Use  . Smoking status: Never Smoker  . Smokeless tobacco: Never Used  Substance and Sexual Activity  . Alcohol use: No    Alcohol/week: 0.0 oz  . Drug use: No  . Sexual activity: Yes  Other Topics Concern  . Not on file  Social History Narrative   Married. Education: [employer]. Exercise: Yes.   Right-handed.   Drinks coffee daily.   Lives at home with his wife.      Review of Systems  Constitutional: Negative for chills, fatigue, fever and unexpected weight change.  Eyes: Negative for visual disturbance.  Respiratory: Negative for cough, choking, chest tightness and shortness of breath.   Cardiovascular: Negative for chest pain, palpitations and leg swelling.  Gastrointestinal: Negative for abdominal pain, blood in stool, nausea and vomiting.  Skin: Negative for color change.  Neurological: Negative for dizziness, syncope, speech difficulty, light-headedness and headaches.       Objective:   Physical Exam  Constitutional: He is oriented to person, place, and time. He appears well-developed  and well-nourished. No distress.  [name]:  Head: Normocephalic and atraumatic.  Eyes: EOM are normal. Pupils are equal, round, and reactive to light.  Neck: No JVD present. Carotid bruit is not present.  Cardiovascular: Normal rate, regular rhythm and normal heart sounds.  No murmur heard. Pulmonary/Chest: Effort normal and breath sounds normal. He has no rales.  Musculoskeletal: He exhibits no edema.  Neurological: He is alert and oriented to person, place, and time.  Skin: Skin is warm and dry.  Psychiatric: He has a normal mood and affect.  Vitals reviewed.   Vitals:  02/05/17 1012  BP: 112/68  Pulse: 77  Resp: 16  Temp: 98.3 F (36.8 C)  TempSrc: Oral  SpO2: 97%  Weight: 199 lb (90.3 kg)  Height: 5\' 11"  (1.803 m)       Assessment & Plan:   Douglas [name] is a 81 y.o. male Diabetes mellitus type 2, diet-controlled ([place]) - Plan: Hemoglobin A1c, Comprehensive metabolic panel  - Diet controlled previously, check A1c.  B12 deficiency - Plan: cyanocobalamin ((VITAMIN B-12)) injection 1,000 mcg  -Repeat injection given as per neurology recommendations, plan follow-up next month with neurology, would recommend B12 repeat testing at that time. Attempted order today, but not covered at this time.  Hyperlipidemia, unspecified hyperlipidemia type - Plan: Comprehensive metabolic panel, Lipid panel  - Tolerating simvastatin, continue same dose and labs pending   Hypogonadism in male - Plan: Testosterone, Free, Total, SHBG History of prostate cancer - Plan: Testosterone, Free, Total, SHBG  - Agreed to check testosterone, but would recommend he discuss medication refills with urology as they have treated his hypogonadism previously, as well as prostate cancer.  Meds ordered this encounter  Medications  . cyanocobalamin ((VITAMIN B-12)) injection 1,000 mcg   Patient Instructions   B12 injection today, keep follow up with Dr. Krista Blue to discuss repeat injection and further monitoring.  B12 testing was not covered today, so may need to be checked with Dr. Krista Blue at upcoming visit.   I will check diabetes test to determine if meds are needed.   I will check testosterone levels today, but would recommend discussing refills of your Axiron with urology.   Follow up in 3 months for now. Let me know if there are any questions in the meantime. Thanks for coming in today.    IF you received an x-ray today, you will receive an invoice from [facility]. Please contact [facility] at [phone] with questions or concerns regarding your invoice.   IF you received labwork today, you will receive an invoice from [place]. Please contact [facility] at [phone] with questions or concerns regarding your invoice.   Our billing staff will not be able to assist you with questions regarding bills from these companies.  You will be contacted with the lab results as soon as they are available. The fastest way to get your results is to activate your My Chart account. Instructions are located on the last page of this paperwork. If you have not heard from [place] regarding the results in 2 weeks, please contact this office.       I personally performed the services described in this documentation, which was scribed in my presence. The recorded information has been reviewed and considered for accuracy and completeness, addended by me as needed, and agree with information above.  Signed,   [name], MD Primary Care at [place].  02/05/17 1:29 PM

## 2017-02-05 NOTE — Telephone Encounter (Signed)
error 

## 2017-02-06 LAB — COMPREHENSIVE METABOLIC PANEL
A/G RATIO: 1.5 (ref 1.2–2.2)
ALBUMIN: 4.5 g/dL (ref 3.5–4.8)
ALT: 14 IU/L (ref 0–44)
AST: 16 IU/L (ref 0–40)
Alkaline Phosphatase: 63 IU/L (ref 39–117)
BILIRUBIN TOTAL: 0.6 mg/dL (ref 0.0–1.2)
BUN / CREAT RATIO: 8 — AB (ref 10–24)
BUN: 10 mg/dL (ref 8–27)
CALCIUM: 10.3 mg/dL — AB (ref 8.6–10.2)
CHLORIDE: 102 mmol/L (ref 96–106)
CO2: 29 mmol/L (ref 20–29)
Creatinine, Ser: 1.19 mg/dL (ref 0.76–1.27)
GFR calc Af Amer: 67 mL/min/{1.73_m2} (ref >59)
GFR, EST NON AFRICAN AMERICAN: 58 mL/min/{1.73_m2} — AB (ref >59)
GLOBULIN, TOTAL: 3.1 g/dL (ref 1.5–4.5)
Glucose: 125 mg/dL — ABNORMAL HIGH (ref 65–99)
POTASSIUM: 4.9 mmol/L (ref 3.5–5.2)
Sodium: 143 mmol/L (ref 134–144)
TOTAL PROTEIN: 7.6 g/dL (ref 6.0–8.5)

## 2017-02-06 LAB — LIPID PANEL
CHOL/HDL RATIO: 2.9 ratio (ref 0.0–5.0)
Cholesterol, Total: 123 mg/dL (ref 100–199)
HDL: 42 mg/dL (ref >39)
LDL Calculated: 65 mg/dL (ref 0–99)
Triglycerides: 82 mg/dL (ref 0–149)
VLDL Cholesterol Cal: 16 mg/dL (ref 5–40)

## 2017-02-06 LAB — TESTOSTERONE, FREE, TOTAL, SHBG
Sex Hormone Binding: 24.3 nmol/L (ref 19.3–76.4)
TESTOSTERONE: 199 ng/dL — AB (ref 264–916)
Testosterone, Free: 5.3 pg/mL — ABNORMAL LOW (ref 6.6–18.1)

## 2017-02-06 LAB — HEMOGLOBIN A1C
Est. average glucose Bld gHb Est-mCnc: 148 mg/dL
Hgb A1c MFr Bld: 6.8 % — ABNORMAL HIGH (ref 4.8–5.6)

## 2017-02-12 ENCOUNTER — Telehealth: Payer: Self-pay | Admitting: Neurology

## 2017-02-12 ENCOUNTER — Other Ambulatory Visit: Payer: Self-pay | Admitting: *Deleted

## 2017-02-12 DIAGNOSIS — E538 Deficiency of other specified B group vitamins: Secondary | ICD-10-CM

## 2017-02-12 MED ORDER — CYANOCOBALAMIN 1000 MCG/ML IJ SOLN
1000.0000 ug | INTRAMUSCULAR | Status: DC
  Administered 2017-02-16 – 2017-07-16 (×6): 1000 ug via INTRAMUSCULAR

## 2017-02-12 NOTE — Telephone Encounter (Signed)
Pt states new orders are needed to his PCP Dr [name] re: his cyanocobalamin (,VITAMIN B-12,) 1000 MCG/ML injection so he can continue to get his injections monthly.  Please call pt

## 2017-02-12 NOTE — Telephone Encounter (Signed)
Spoke to patient - he started his B12 injections w/ his PCP office.  States he was instructed to follow up here to continue injections.  He is ready to go on a monthly schedule.  He has an appt on 02/16/17 at 11am.

## 2017-02-16 ENCOUNTER — Ambulatory Visit (INDEPENDENT_AMBULATORY_CARE_PROVIDER_SITE_OTHER): Payer: Medicare Other | Admitting: *Deleted

## 2017-02-16 DIAGNOSIS — E538 Deficiency of other specified B group vitamins: Secondary | ICD-10-CM

## 2017-02-16 NOTE — Progress Notes (Signed)
Pt here for B 12 injection.  Under aseptic technique cyanocobalamin 1037mcg/1ml IM given L deltoid.   Tolerated well.  Bandaid applied.  Will have next injection when comes in next month on the 03-16-17 to see Dr. Krista Blue.  Will discuss if needs additional lab work, oral supplement vs injection.

## 2017-02-23 ENCOUNTER — Other Ambulatory Visit: Payer: Self-pay | Admitting: Family Medicine

## 2017-02-23 DIAGNOSIS — E785 Hyperlipidemia, unspecified: Secondary | ICD-10-CM

## 2017-02-25 ENCOUNTER — Encounter: Payer: Self-pay | Admitting: Gastroenterology

## 2017-03-02 DIAGNOSIS — H04123 Dry eye syndrome of bilateral lacrimal glands: Secondary | ICD-10-CM | POA: Diagnosis not present

## 2017-03-02 DIAGNOSIS — H401131 Primary open-angle glaucoma, bilateral, mild stage: Secondary | ICD-10-CM | POA: Diagnosis not present

## 2017-03-02 DIAGNOSIS — H40051 Ocular hypertension, right eye: Secondary | ICD-10-CM | POA: Diagnosis not present

## 2017-03-02 DIAGNOSIS — H401121 Primary open-angle glaucoma, left eye, mild stage: Secondary | ICD-10-CM | POA: Diagnosis not present

## 2017-03-02 DIAGNOSIS — H40052 Ocular hypertension, left eye: Secondary | ICD-10-CM | POA: Diagnosis not present

## 2017-03-16 ENCOUNTER — Ambulatory Visit: Payer: Self-pay

## 2017-03-16 ENCOUNTER — Telehealth: Payer: Self-pay | Admitting: Neurology

## 2017-03-16 ENCOUNTER — Encounter: Payer: Self-pay | Admitting: Neurology

## 2017-03-16 ENCOUNTER — Ambulatory Visit (INDEPENDENT_AMBULATORY_CARE_PROVIDER_SITE_OTHER): Payer: Medicare Other | Admitting: Neurology

## 2017-03-16 VITALS — BP 136/71 | HR 71 | Ht 71.0 in | Wt 202.0 lb

## 2017-03-16 DIAGNOSIS — E538 Deficiency of other specified B group vitamins: Secondary | ICD-10-CM | POA: Diagnosis not present

## 2017-03-16 DIAGNOSIS — R519 Headache, unspecified: Secondary | ICD-10-CM

## 2017-03-16 DIAGNOSIS — R51 Headache: Secondary | ICD-10-CM

## 2017-03-16 NOTE — Progress Notes (Signed)
PATIENT: Douglas [name] DOB: [birth year]  Chief Complaint  Patient presents with  . Headache/Dizziness    He would like to discuss his MRI results. He is still having a mild, constant headache. However, he is feeling better since starting his monthly B12 injections.  He has a pending appt with his eye doctor.     HISTORICAL  Douglas [name] is a 81 year old male, seen in refer by his ENT Dr. Erik Obey for evaluation of headache, dizziness,  His primary care physician is Dr. [name]. Initial evaluation was on December 15 2016  I reviewed and summarized the referring note from his ENT Dr. Mare Loan, he was evaluated for pressure in his forehead, vertigo, CT of the paranasal sinus severe left worse septal deviation, but no evidence of active sinusitis.  He reported a history of headaches since 2015, pressure at bilateral frontal region, occasionally with associated spinning sensation, improved after lying down resting for a while, no hearing loss, no tinnitus, recurrent every 3-4 months, lasting for a few hours  He frequently feel stuffy sensation in his nose, use nasal spray, take meclizine as needed, without significant improvement,  Laboratory evaluation in May 2018 showed A1c was mildly elevated at 6.7, BMP showed mild elevated glucose 117, ESR was 25, fasting lipid profile showed LDL 62, total cholesterol is 119.  UPDATE Mar 16 2017: He was diagnosed with glaucoma by ophthalmologist Dr. [name] in September 2018 after initial visit, is going to have glaucoma surgery on March 30, 2017, he complains of bifrontal pressure headaches, no light sensitive, no visual changes.  We have personally reviewed MRI of the brain in September 2018, mild generalized atrophy, supratentorium small vessel disease,  Laboratory evaluation in November 2018: LDL 65, mild elevated glucose at 128, A1c 6.8, normal ESR, ANA, TSH, C-reactive protein, RPR, B12 was decreased 185, he has received B12 IM  supplement.   REVIEW OF SYSTEMS: Full 14 system review of systems performed and notable only for ringing in ears, light sensitivity,  ALLERGIES: No Known Allergies  HOME MEDICATIONS: Current Outpatient Medications  Medication Sig Dispense Refill  . alfuzosin (UROXATRAL) 10 MG 24 hr tablet     . aspirin 81 MG tablet Take 81 mg by mouth daily.    . Cholecalciferol (VITAMIN D) 2000 UNITS tablet Take 2,000 Units by mouth daily.    . pantoprazole (PROTONIX) 40 MG tablet Take 1 tablet (40 mg total) by mouth daily. 90 tablet 3  . simvastatin (ZOCOR) 20 MG tablet Take 1 tablet (20 mg total) by mouth daily at 6 PM. 90 tablet 0  . Testosterone 30 MG/ACT SOLN Place 30 g onto the skin.    Marland Kitchen vitamin C (ASCORBIC ACID) 500 MG tablet Take 500 mg by mouth daily.     Current Facility-Administered Medications  Medication Dose Route Frequency Provider Last Rate Last Dose  . cyanocobalamin ((VITAMIN B-12)) injection 1,000 mcg  1,000 mcg Intramuscular Q30 days Marcial Pacas, MD   1,000 mcg at 03/16/17 1049    PAST MEDICAL HISTORY: Past Medical History:  Diagnosis Date  . Adenomatous colon polyp 03/2001  . Allergy    SEASONAL  . Cancer ([place])   . Cataract   . Dizziness   . GERD (gastroesophageal reflux disease)   . Hyperlipidemia   . Low testosterone     PAST SURGICAL HISTORY: Past Surgical History:  Procedure Laterality Date  . APPENDECTOMY    . COLONOSCOPY    . EYE SURGERY    . POLYPECTOMY    .  PROSTATE SURGERY    . SPINE SURGERY      FAMILY HISTORY: Family History  Problem Relation Age of Onset  . Cancer Father 71       pancreatic  . Other Mother        age 92  . Prostate cancer Brother   . Colon cancer Neg Hx     SOCIAL HISTORY:  Social History   Socioeconomic History  . Marital status: Married    Spouse name: Not on file  . Number of children: 3  . Years of education: 2 years [employer]  . Highest education level: Not on file  Social Needs  . Financial resource strain: Not on  file  . Food insecurity - worry: Not on file  . Food insecurity - inability: Not on file  . Transportation needs - medical: Not on file  . Transportation needs - non-medical: Not on file  Occupational History  . Occupation: RETIRED  Tobacco Use  . Smoking status: Never Smoker  . Smokeless tobacco: Never Used  Substance and Sexual Activity  . Alcohol use: No    Alcohol/week: 0.0 oz  . Drug use: No  . Sexual activity: Yes  Other Topics Concern  . Not on file  Social History Narrative   Married. Education: [employer]. Exercise: Yes.   Right-handed.   Drinks coffee daily.   Lives at home with his wife.     PHYSICAL EXAM   Vitals:   03/16/17 1055  BP: 136/71  Pulse: 71  Weight: 202 lb (91.6 kg)  Height: _0  (1.803 m)    Not recorded      Body mass index is 28.17 kg/m.  PHYSICAL EXAMNIATION:  Gen: NAD, conversant, well nourised, obese, well groomed                     Cardiovascular: Regular rate rhythm, no peripheral edema, warm, nontender. Eyes: Conjunctivae clear without exudates or hemorrhage Neck: Supple, no carotid bruits. Pulmonary: Clear to auscultation bilaterally   NEUROLOGICAL EXAM:  MENTAL STATUS: Speech:    Speech is normal; fluent and spontaneous with normal comprehension.  Cognition:     Orientation to time, place and person     Normal recent and remote memory     Normal Attention span and concentration     Normal Language, naming, repeating,spontaneous speech     Fund of knowledge   CRANIAL NERVES: CN II: Visual fields are full to confrontation. Fundoscopic exam is normal with sharp discs and no vascular changes. Pupils are round equal and briskly reactive to light. CN III, IV, VI: extraocular movement are normal. No ptosis. CN V: Facial sensation is intact to pinprick in all 3 divisions bilaterally. Corneal responses are intact.  CN VII: Face is symmetric with normal eye closure and smile. CN VIII: Hearing is normal to rubbing fingers CN  IX, X: Palate elevates symmetrically. Phonation is normal. CN XI: Head turning and shoulder shrug are intact CN XII: Tongue is midline with normal movements and no atrophy.  MOTOR: There is no pronator drift of out-stretched arms. Muscle bulk and tone are normal. Muscle strength is normal.  REFLEXES: Reflexes are 2+ and symmetric at the biceps, triceps, knees, and ankles. Plantar responses are flexor.  SENSORY: Intact to light touch, pinprick, positional sensation and vibratory sensation are intact in fingers and toes.  COORDINATION: Rapid alternating movements and fine finger movements are intact. There is no dysmetria on finger-to-nose and heel-knee-shin.    GAIT/STANCE: Posture  is normal. Gait is steady with normal steps, base, arm swing, and turning. Heel and toe walking are normal. Tandem gait is normal.  Romberg is absent.   DIAGNOSTIC DATA (LABS, IMAGING, TESTING) - I reviewed patient records, labs, notes, testing and imaging myself where available.   ASSESSMENT AND PLAN  KORDE JEPPSEN is a 81 y.o. male   New onset headaches, dizziness  MRI of the brain showed evidence of generalized atrophy, mild supratentorium small vessel disease  This is most related to his most recent diagnosis of glaucoma  Continue management with his ophthalmologist Dr. [name]  Vitamin B12 deficiency  Continue vitamin B12 supplement,  Repeat methylmalonic acid, homocystine level    Marcial Pacas, M.D. Ph.D.  [facility] [address] [place], [place] [address] Ph: [phone] Fax: [phone]  CC: [name], MD

## 2017-03-16 NOTE — Telephone Encounter (Signed)
Pt. states they are to receive injections. I did not see the order. Can you please check if they are needing Inj. Thank you.

## 2017-03-16 NOTE — Telephone Encounter (Signed)
Spoke to patient - he needed to schedule his January B12 injection.  He is coming in on 04/20/17.

## 2017-03-16 NOTE — Telephone Encounter (Signed)
I believe these are B12 shots, can you confirm Sharyn Lull?

## 2017-03-17 ENCOUNTER — Other Ambulatory Visit: Payer: Self-pay

## 2017-03-17 ENCOUNTER — Encounter: Payer: Self-pay | Admitting: Family Medicine

## 2017-03-17 ENCOUNTER — Ambulatory Visit (INDEPENDENT_AMBULATORY_CARE_PROVIDER_SITE_OTHER): Payer: Medicare Other | Admitting: Family Medicine

## 2017-03-17 VITALS — BP 124/66 | HR 74 | Temp 98.6°F | Resp 16 | Ht 72.0 in | Wt 200.4 lb

## 2017-03-17 DIAGNOSIS — R05 Cough: Secondary | ICD-10-CM | POA: Diagnosis not present

## 2017-03-17 DIAGNOSIS — J22 Unspecified acute lower respiratory infection: Secondary | ICD-10-CM

## 2017-03-17 DIAGNOSIS — R059 Cough, unspecified: Secondary | ICD-10-CM

## 2017-03-17 MED ORDER — BENZONATATE 100 MG PO CAPS: 100.0000 mg | ORAL_CAPSULE | Freq: Three times a day (TID) | ORAL | 0 refills | Status: DC | PRN

## 2017-03-17 MED ORDER — AZITHROMYCIN 250 MG PO TABS: ORAL_TABLET | ORAL | 0 refills | Status: DC

## 2017-03-17 MED ORDER — HYDROCODONE-HOMATROPINE 5-1.5 MG/5ML PO SYRP: ORAL_SOLUTION | ORAL | 0 refills | Status: DC

## 2017-03-17 NOTE — Progress Notes (Signed)
Subjective:  By signing my name below, I, Douglas [name], attest that this documentation has been prepared under the direction and in the presence of Douglas Ray, MD. Electronically Signed: Moises [name], [place]. 03/17/2017 , 1:56 PM .  Patient was seen in Room 1 .   Patient ID: Douglas [name], male    DOB: 09/01/1935, 81 y.o.   MRN: [id number] Chief Complaint  Patient presents with  . Cough    dry, x 10 days  . constantly clearing throat   HPI Douglas [name] is a 81 y.o. male Patient complains of a dry, non-productive cough that started about 10 days ago. He notes having some nasal congestion but unable to blow anything out. He reports having some night sweats and chills with this cough. He mentions his cough has kept him up at night. He's been taking a tylenol cold medication without relief. His wife had similar symptoms and has been taking medications for this; patient informs wife started with symptoms first. He denies any changes or worsening of cough. He denies measured fever, sinus pressure, sinus pain, chest pain or shortness of breath.   Patient Active Problem List   Diagnosis Date Noted  . Vitamin B12 deficiency 03/16/2017  . B12 deficiency 12/16/2016  . Dizziness 12/15/2016  . Nonintractable headache 12/15/2016  . Bladder outlet obstruction 04/25/2015  . Erectile dysfunction due to arterial insufficiency 04/25/2015  . Osteoporosis 04/25/2015  . Vitamin D deficiency 04/25/2015  . Heme positive stool 03/16/2014  . Abdominal pain, epigastric 03/16/2014  . Prostate cancer ([place]) 05/30/2011  . Dyslipidemia 05/30/2011  . Hypogonadism male 05/30/2011  . Acute sinusitis 01/07/2007  . RHINITIS 08/25/2006   Past Medical History:  Diagnosis Date  . Adenomatous colon polyp 03/2001  . Allergy    SEASONAL  . Cancer ([place])   . Cataract   . Dizziness   . GERD (gastroesophageal reflux disease)   . Hyperlipidemia   . Low testosterone    Past Surgical History:  Procedure  Laterality Date  . APPENDECTOMY    . COLONOSCOPY    . EYE SURGERY    . POLYPECTOMY    . PROSTATE SURGERY    . SPINE SURGERY     No Known Allergies Prior to Admission medications   Medication Sig Start Date End Date Taking? Authorizing Provider  alfuzosin (UROXATRAL) 10 MG 24 hr tablet  11/09/14   [provider]  aspirin 81 MG tablet Take 81 mg by mouth [name].    [provider]  Cholecalciferol (VITAMIN D) 2000 UNITS tablet Take 2,000 Units by mouth [name].    [provider]  pantoprazole (PROTONIX) 40 MG tablet Take 1 tablet (40 mg total) by mouth [name]. 05/05/16   [name], MD  simvastatin (ZOCOR) 20 MG tablet Take 1 tablet (20 mg total) by mouth [name] at 6 PM. 02/23/17   [name], MD  Testosterone 30 MG/ACT SOLN Place 30 g onto the skin.    [provider]  vitamin C (ASCORBIC ACID) 500 MG tablet Take 500 mg by mouth [name].    [provider]   Social History   Socioeconomic History  . Marital status: Married    Spouse name: Not on file  . Number of children: 3  . Years of education: 2 years [employer]  . Highest education level: Not on file  Social Needs  . Financial resource strain: Not on file  . Food insecurity - worry: Not on file  . Food  insecurity - inability: Not on file  . Transportation needs - medical: Not on file  . Transportation needs - non-medical: Not on file  Occupational History  . Occupation: RETIRED  Tobacco Use  . Smoking status: Never Smoker  . Smokeless tobacco: Never Used  Substance and Sexual Activity  . Alcohol use: No    Alcohol/week: 0.0 oz  . Drug use: No  . Sexual activity: Yes  Other Topics Concern  . Not on file  Social History Narrative   Married. Education: [employer]. Exercise: Yes.   Right-handed.   Drinks coffee [name].   Lives at home with his wife.   Review of Systems  Constitutional: Positive for chills. Negative for fatigue and fever.  [name]: Positive for congestion.  Negative for postnasal drip, rhinorrhea, sinus pressure, sinus pain, sneezing and sore throat.   Respiratory: Positive for cough. Negative for shortness of breath and wheezing.   Cardiovascular: Negative for chest pain.       Objective:   Physical Exam  Constitutional: He is oriented to person, place, and time. He appears well-developed and well-nourished.  [name]:  Head: Normocephalic and atraumatic.  Right Ear: Tympanic membrane, external ear and ear canal normal.  Left Ear: Tympanic membrane, external ear and ear canal normal.  Nose: No rhinorrhea. Right sinus exhibits no maxillary sinus tenderness and no frontal sinus tenderness. Left sinus exhibits no maxillary sinus tenderness and no frontal sinus tenderness.  Mouth/Throat: Oropharynx is clear and moist and mucous membranes are normal. No oropharyngeal exudate or posterior oropharyngeal erythema.  Eyes: Conjunctivae are normal. Pupils are equal, round, and reactive to light.  Neck: Neck supple.  Cardiovascular: Normal rate, regular rhythm, normal heart sounds and intact distal pulses.  No murmur heard. Pulmonary/Chest: Effort normal and breath sounds normal. He has no wheezes. He has no rhonchi. He has no rales.  Abdominal: Soft. There is no tenderness.  Lymphadenopathy:    He has no cervical adenopathy.  Neurological: He is alert and oriented to person, place, and time.  Skin: Skin is warm and dry. No rash noted.  Psychiatric: He has a normal mood and affect. His behavior is normal.  Vitals reviewed.   Vitals:   03/17/17 1333  BP: 124/66  Pulse: 74  Resp: 16  Temp: 98.6 F (37 C)  TempSrc: Oral  SpO2: 97%  Weight: 200 lb 6.4 oz (90.9 kg)  Height: 6' (1.829 m)       Assessment & Plan:    Douglas [name] is a 81 y.o. male Cough - Plan: benzonatate (TESSALON) 100 MG capsule, HYDROcodone-homatropine (HYCODAN) 5-1.5 MG/5ML syrup  LRTI (lower respiratory tract infection) - Plan: azithromycin (ZITHROMAX) 250 MG  tablet  Initial symptoms suspicious for viral upper respiratory infection. However he has had persistent cough, some subjective night sweats/chills without fever. Afebrile, reassuring exam. Deferred testing/imaging today  - Initially treat cough with Tessalon Perles during the day or Mucinex over-the-counter, hydrocodone cough syrup at night. Potential side effects and fall precautions were discussed  - If not improving in the next few days, start azithromycin.  -RTC precautions discussed if worsening symptoms.  Meds ordered this encounter  Medications  . azithromycin (ZITHROMAX) 250 MG tablet    Sig: Take 2 pills by mouth on day 1, then 1 pill by mouth per day on days 2 through 5.    Dispense:  6 tablet    Refill:  0  . benzonatate (TESSALON) 100 MG capsule    Sig: Take 1 capsule (100 mg  total) by mouth 3 (three) times [name] as needed for cough.    Dispense:  20 capsule    Refill:  0  . HYDROcodone-homatropine (HYCODAN) 5-1.5 MG/5ML syrup    Sig: 73m by mouth a bedtime as needed for cough.    Dispense:  120 mL    Refill:  0   Patient Instructions    Cough may be due to symptoms from a virus, but with persistent symptoms I did write an antibiotic if you're not improving in the next couple of days for possible early bronchitis. You can use the [employer] as needed for cough during the day, hydrocodone cough syrup at night.  If you're unable to take the [employer], Mucinex over-the-counter can also be used during the day.   Cough, Adult Coughing is a reflex that clears your throat and your airways. Coughing helps to heal and protect your lungs. It is normal to cough occasionally, but a cough that happens with other symptoms or lasts a long time may be a sign of a condition that needs treatment. A cough may last only 2-3 weeks (acute), or it may last longer than 8 weeks (chronic). What are the causes? Coughing is commonly caused by:  Breathing in substances that irritate your  lungs.  A viral or bacterial respiratory infection.  Allergies.  Asthma.  Postnasal drip.  Smoking.  Acid backing up from the stomach into the esophagus (gastroesophageal reflux).  Certain medicines.  Chronic lung problems, including COPD (or rarely, lung cancer).  Other medical conditions such as heart failure.  Follow these instructions at home: Pay attention to any changes in your symptoms. Take these actions to help with your discomfort:  Take medicines only as told by your health care provider. ? If you were prescribed an antibiotic medicine, take it as told by your health care provider. Do not stop taking the antibiotic even if you start to feel better. ? Talk with your health care provider before you take a cough suppressant medicine.  Drink enough fluid to keep your urine clear or pale yellow.  If the air is dry, use a cold steam vaporizer or humidifier in your bedroom or your home to help loosen secretions.  Avoid anything that causes you to cough at work or at home.  If your cough is worse at night, try sleeping in a semi-upright position.  Avoid cigarette smoke. If you smoke, quit smoking. If you need help quitting, ask your health care provider.  Avoid caffeine.  Avoid alcohol.  Rest as needed.  Contact a health care provider if:  You have new symptoms.  You cough up pus.  Your cough does not get better after 2-3 weeks, or your cough gets worse.  You cannot control your cough with suppressant medicines and you are losing sleep.  You develop pain that is getting worse or pain that is not controlled with pain medicines.  You have a fever.  You have unexplained weight loss.  You have night sweats. Get help right away if:  You cough up [name].  You have difficulty breathing.  Your heartbeat is very fast. This information is not intended to replace advice given to you by your health care provider. Make sure you discuss any questions you have  with your health care provider. Document Released: 09/13/2010 Document Revised: 08/23/2015 Document Reviewed: 05/24/2014 Elsevier Interactive Patient Education  2017 [employer].   IF you received an x-[name] today, you will receive an invoice from [facility]. Please  contact [facility] at [phone] with questions or concerns regarding your invoice.   IF you received labwork today, you will receive an invoice from [place]. Please contact [facility] at [phone] with questions or concerns regarding your invoice.   Our billing staff will not be able to assist you with questions regarding bills from these companies.  You will be contacted with the lab results as soon as they are available. The fastest way to get your results is to activate your My Chart account. Instructions are located on the last page of this paperwork. If you have not heard from [place] regarding the results in 2 weeks, please contact this office.      I personally performed the services described in this documentation, which was scribed in my presence. The recorded information has been reviewed and considered for accuracy and completeness, addended by me as needed, and agree with information above.  Signed,   Douglas Ray, MD [facility].  03/17/17 2:01 PM

## 2017-03-17 NOTE — Patient Instructions (Addendum)
Cough may be due to symptoms from a virus, but with persistent symptoms I did write an antibiotic if you're not improving in the next couple of days for possible early bronchitis. You can use the [employer] as needed for cough during the day, hydrocodone cough syrup at night.  If you're unable to take the [employer], Mucinex over-the-counter can also be used during the day.   Cough, Adult Coughing is a reflex that clears your throat and your airways. Coughing helps to heal and protect your lungs. It is normal to cough occasionally, but a cough that happens with other symptoms or lasts a long time may be a sign of a condition that needs treatment. A cough may last only 2-3 weeks (acute), or it may last longer than 8 weeks (chronic). What are the causes? Coughing is commonly caused by:  Breathing in substances that irritate your lungs.  A viral or bacterial respiratory infection.  Allergies.  Asthma.  Postnasal drip.  Smoking.  Acid backing up from the stomach into the esophagus (gastroesophageal reflux).  Certain medicines.  Chronic lung problems, including COPD (or rarely, lung cancer).  Other medical conditions such as heart failure.  Follow these instructions at home: Pay attention to any changes in your symptoms. Take these actions to help with your discomfort:  Take medicines only as told by your health care provider. ? If you were prescribed an antibiotic medicine, take it as told by your health care provider. Do not stop taking the antibiotic even if you start to feel better. ? Talk with your health care provider before you take a cough suppressant medicine.  Drink enough fluid to keep your urine clear or pale yellow.  If the air is dry, use a cold steam vaporizer or humidifier in your bedroom or your home to help loosen secretions.  Avoid anything that causes you to cough at work or at home.  If your cough is worse at night, try sleeping in a semi-upright  position.  Avoid cigarette smoke. If you smoke, quit smoking. If you need help quitting, ask your health care provider.  Avoid caffeine.  Avoid alcohol.  Rest as needed.  Contact a health care provider if:  You have new symptoms.  You cough up pus.  Your cough does not get better after 2-3 weeks, or your cough gets worse.  You cannot control your cough with suppressant medicines and you are losing sleep.  You develop pain that is getting worse or pain that is not controlled with pain medicines.  You have a fever.  You have unexplained weight loss.  You have night sweats. Get help right away if:  You cough up blood.  You have difficulty breathing.  Your heartbeat is very fast. This information is not intended to replace advice given to you by your health care provider. Make sure you discuss any questions you have with your health care provider. Document Released: 09/13/2010 Document Revised: 08/23/2015 Document Reviewed: 05/24/2014 Elsevier Interactive Patient Education  2017 [employer].   IF you received an x-ray today, you will receive an invoice from [facility]. Please contact [facility] at [phone] with questions or concerns regarding your invoice.   IF you received labwork today, you will receive an invoice from [place]. Please contact [facility] at [phone] with questions or concerns regarding your invoice.   Our billing staff will not be able to assist you with questions regarding bills from these companies.  You will be contacted with the lab  results as soon as they are available. The fastest way to get your results is to activate your My Chart account. Instructions are located on the last page of this paperwork. If you have not heard from [place] regarding the results in 2 weeks, please contact this office.

## 2017-03-18 ENCOUNTER — Ambulatory Visit: Payer: Medicare Other

## 2017-03-20 LAB — VITAMIN B12

## 2017-03-20 LAB — HOMOCYSTEINE: Homocysteine: 7.9 umol/L (ref 0.0–15.0)

## 2017-03-20 LAB — FOLATE: FOLATE: 19.9 ng/mL (ref >3.0)

## 2017-03-20 LAB — METHYLMALONIC ACID, SERUM: Methylmalonic Acid: 125 nmol/L (ref 0–378)

## 2017-03-30 DIAGNOSIS — H401121 Primary open-angle glaucoma, left eye, mild stage: Secondary | ICD-10-CM | POA: Diagnosis not present

## 2017-04-03 DIAGNOSIS — R972 Elevated prostate specific antigen [PSA]: Secondary | ICD-10-CM | POA: Diagnosis not present

## 2017-04-03 DIAGNOSIS — E291 Testicular hypofunction: Secondary | ICD-10-CM | POA: Diagnosis not present

## 2017-04-08 DIAGNOSIS — E291 Testicular hypofunction: Secondary | ICD-10-CM | POA: Diagnosis not present

## 2017-04-08 DIAGNOSIS — C61 Malignant neoplasm of prostate: Secondary | ICD-10-CM | POA: Diagnosis not present

## 2017-04-08 DIAGNOSIS — N401 Enlarged prostate with lower urinary tract symptoms: Secondary | ICD-10-CM | POA: Diagnosis not present

## 2017-04-08 DIAGNOSIS — R351 Nocturia: Secondary | ICD-10-CM | POA: Diagnosis not present

## 2017-04-16 ENCOUNTER — Ambulatory Visit: Payer: Medicare Other

## 2017-04-16 ENCOUNTER — Other Ambulatory Visit: Payer: Self-pay

## 2017-04-16 VITALS — Ht 72.0 in | Wt 204.0 lb

## 2017-04-16 DIAGNOSIS — Z86010 Personal history of colonic polyps: Secondary | ICD-10-CM

## 2017-04-16 MED ORDER — NA SULFATE-K SULFATE-MG SULF 17.5-3.13-1.6 GM/177ML PO SOLN: 1.0000 | Freq: Once | ORAL | 0 refills | Status: AC

## 2017-04-16 NOTE — Progress Notes (Signed)
Denies allergies to eggs or soy products. Denies complication of anesthesia or sedation. Denies use of weight loss medication. Denies use of O2.   Emmi instructions declined.  

## 2017-04-20 ENCOUNTER — Ambulatory Visit (INDEPENDENT_AMBULATORY_CARE_PROVIDER_SITE_OTHER): Payer: Medicare Other | Admitting: *Deleted

## 2017-04-20 DIAGNOSIS — E538 Deficiency of other specified B group vitamins: Secondary | ICD-10-CM

## 2017-04-30 ENCOUNTER — Encounter: Payer: Self-pay | Admitting: Gastroenterology

## 2017-04-30 ENCOUNTER — Other Ambulatory Visit: Payer: Self-pay

## 2017-04-30 ENCOUNTER — Ambulatory Visit (AMBULATORY_SURGERY_CENTER): Payer: Medicare Other | Admitting: Gastroenterology

## 2017-04-30 VITALS — BP 116/62 | HR 63 | Temp 98.6°F | Resp 12 | Ht 72.0 in | Wt 204.0 lb

## 2017-04-30 DIAGNOSIS — Z8551 Personal history of malignant neoplasm of bladder: Secondary | ICD-10-CM | POA: Diagnosis not present

## 2017-04-30 DIAGNOSIS — D123 Benign neoplasm of transverse colon: Secondary | ICD-10-CM | POA: Diagnosis not present

## 2017-04-30 DIAGNOSIS — D122 Benign neoplasm of ascending colon: Secondary | ICD-10-CM

## 2017-04-30 DIAGNOSIS — D128 Benign neoplasm of rectum: Secondary | ICD-10-CM

## 2017-04-30 DIAGNOSIS — Z86010 Personal history of colonic polyps: Secondary | ICD-10-CM

## 2017-04-30 DIAGNOSIS — K219 Gastro-esophageal reflux disease without esophagitis: Secondary | ICD-10-CM | POA: Diagnosis not present

## 2017-04-30 DIAGNOSIS — D129 Benign neoplasm of anus and anal canal: Secondary | ICD-10-CM

## 2017-04-30 MED ORDER — SODIUM CHLORIDE 0.9 % IV SOLN: 500.0000 mL | Freq: Once | INTRAVENOUS | Status: DC

## 2017-04-30 NOTE — Progress Notes (Signed)
Called to room to assist during endoscopic procedure.  Patient ID and intended procedure confirmed with present staff. Received instructions for my participation in the procedure from the performing physician.  

## 2017-04-30 NOTE — Patient Instructions (Signed)
**   Handouts given for polyps, hemorrhoids and high fiber diet**   YOU HAD AN ENDOSCOPIC PROCEDURE TODAY AT [facility]:   Refer to the procedure report that was given to you for any specific questions about what was found during the examination.  If the procedure report does not answer your questions, please call your gastroenterologist to clarify.  If you requested that your care partner not be given the details of your procedure findings, then the procedure report has been included in a sealed envelope for you to review at your convenience later.  YOU SHOULD EXPECT: Some feelings of bloating in the abdomen. Passage of more gas than usual.  Walking can help get rid of the air that was put into your GI tract during the procedure and reduce the bloating. If you had a lower endoscopy (such as a colonoscopy or flexible sigmoidoscopy) you may notice spotting of blood in your stool or on the toilet paper. If you underwent a bowel prep for your procedure, you may not have a normal bowel movement for a few days.  Please Note:  You might notice some irritation and congestion in your nose or some drainage.  This is from the oxygen used during your procedure.  There is no need for concern and it should clear up in a day or so.  SYMPTOMS TO REPORT IMMEDIATELY:   Following lower endoscopy (colonoscopy or flexible sigmoidoscopy):  Excessive amounts of blood in the stool  Significant tenderness or worsening of abdominal pains  Swelling of the abdomen that is new, acute  Fever of 100F or higher   For urgent or emergent issues, a gastroenterologist can be reached at any hour by calling [phone].   DIET:  We do recommend a small meal at first, but then you may proceed to your regular diet.  Drink plenty of fluids but you should avoid alcoholic beverages for 24 hours.  ACTIVITY:  You should plan to take it easy for the rest of today and you should NOT DRIVE or use heavy machinery  until tomorrow (because of the sedation medicines used during the test).    FOLLOW UP: Our staff will call the number listed on your records the next business day following your procedure to check on you and address any questions or concerns that you may have regarding the information given to you following your procedure. If we do not reach you, we will leave a message.  However, if you are feeling well and you are not experiencing any problems, there is no need to return our call.  We will assume that you have returned to your regular daily activities without incident.  If any biopsies were taken you will be contacted by phone or by letter within the next 1-3 weeks.  Please call us at [phone] if you have not heard about the biopsies in 3 weeks.    SIGNATURES/CONFIDENTIALITY: You and/or your care partner have signed paperwork which will be entered into your electronic medical record.  These signatures attest to the fact that that the information above on your After Visit Summary has been reviewed and is understood.  Full responsibility of the confidentiality of this discharge information lies with you and/or your care-partner.

## 2017-04-30 NOTE — Op Note (Signed)
[facility] Patient Name: Douglas [name] Procedure Date: 04/30/2017 9:04 AM MRN: [id number] Endoscopist: [name] , MD Age: 82 Referring MD:  Date of Birth: [birth year] Gender: Male Account #: [id number] Procedure:                Colonoscopy Indications:              Surveillance: History of numerous (> 10) adenomas                            on last colonoscopy (< 3 yrs) Medicines:                Monitored Anesthesia Care Procedure:                Pre-Anesthesia Assessment:                           - Prior to the procedure, a History and Physical                            was performed, and patient medications and                            allergies were reviewed. The patient's tolerance of                            previous anesthesia was also reviewed. The risks                            and benefits of the procedure and the sedation                            options and risks were discussed with the patient.                            All questions were answered, and informed consent                            was obtained. Prior Anticoagulants: The patient has                            taken no previous anticoagulant or antiplatelet                            agents. ASA Grade Assessment: II - A patient with                            mild systemic disease. After reviewing the risks                            and benefits, the patient was deemed in                            satisfactory condition to undergo the procedure.  After obtaining informed consent, the colonoscope                            was passed under direct vision. Throughout the                            procedure, the patient's blood pressure, pulse, and                            oxygen saturations were monitored continuously. The                            Colonoscope was introduced through the anus and                            advanced to the the cecum,  identified by                            appendiceal orifice and ileocecal valve. The                            ileocecal valve, appendiceal orifice, and rectum                            were photographed. The quality of the bowel                            preparation was good. The colonoscopy was performed                            without difficulty. The patient tolerated the                            procedure well. Scope In: 9:14:33 AM Scope Out: 9:32:03 AM Scope Withdrawal Time: 0 hours 14 minutes 40 seconds  Total Procedure Duration: 0 hours 17 minutes 30 seconds  Findings:                 The perianal and digital rectal examinations were                            normal.                           Four sessile polyps were found in the rectum,                            transverse colon (2) and ascending colon. The                            polyps were 6 to 9 mm in size. These polyps were                            removed with a cold snare. Resection and retrieval  were complete.                           A 6 mm polyp was found in the ascending colon. The                            polyp was sessile. The polyp was removed with a                            cold biopsy forceps. Resection and retrieval were                            complete.                           Internal hemorrhoids were found during                            retroflexion. The hemorrhoids were small and Grade                            I (internal hemorrhoids that do not prolapse).                           The exam was otherwise without abnormality on                            direct and retroflexion views. Complications:            No immediate complications. Estimated blood loss:                            None. Estimated Blood Loss:     Estimated blood loss: none. Impression:               - Four 6 to 9 mm polyps in the rectum, in the                             transverse colon and in the ascending colon,                            removed with a cold snare. Resected and retrieved.                           - One 6 mm polyp in the ascending colon, removed                            with a cold biopsy forceps. Resected and retrieved.                           - Internal hemorrhoids.                           - The examination was otherwise normal on direct  and retroflexion views. Recommendation:           - Repeat colonoscopy in 3 years for surveillance.                           - Patient has a contact number available for                            emergencies. The signs and symptoms of potential                            delayed complications were discussed with the                            patient. Return to normal activities tomorrow.                            Written discharge instructions were provided to the                            patient.                           - Resume previous diet.                           - Continue present medications.                           - Await pathology results. [name], MD 04/30/2017 9:36:30 AM This report has been signed electronically.

## 2017-04-30 NOTE — Progress Notes (Signed)
Report given to PACU, vss 

## 2017-05-01 ENCOUNTER — Telehealth: Payer: Self-pay

## 2017-05-01 NOTE — Telephone Encounter (Signed)
  Follow up Call-  Call back number 04/30/2017 02/09/2015  Post procedure Call Back phone  # [phone]  Permission to leave phone message Yes Yes  Some recent data might be hidden     Patient questions:  Do you have a fever, pain , or abdominal swelling? No. Pain Score  0 *  Have you tolerated food without any problems? Yes.    Have you been able to return to your normal activities? Yes.    Do you have any questions about your discharge instructions: Diet   No. Medications  No. Follow up visit  No.  Do you have questions or concerns about your Care? No.  Actions: * If pain score is 4 or above: No action needed, pain <4.

## 2017-05-01 NOTE — Telephone Encounter (Signed)
No name or number identifier, left a voice mail. 

## 2017-05-11 DIAGNOSIS — H401131 Primary open-angle glaucoma, bilateral, mild stage: Secondary | ICD-10-CM | POA: Diagnosis not present

## 2017-05-11 DIAGNOSIS — H5319 Other subjective visual disturbances: Secondary | ICD-10-CM | POA: Diagnosis not present

## 2017-05-11 DIAGNOSIS — H04123 Dry eye syndrome of bilateral lacrimal glands: Secondary | ICD-10-CM | POA: Diagnosis not present

## 2017-05-11 DIAGNOSIS — H40053 Ocular hypertension, bilateral: Secondary | ICD-10-CM | POA: Diagnosis not present

## 2017-05-13 ENCOUNTER — Ambulatory Visit (INDEPENDENT_AMBULATORY_CARE_PROVIDER_SITE_OTHER): Payer: Medicare Other | Admitting: Physician Assistant

## 2017-05-13 ENCOUNTER — Other Ambulatory Visit: Payer: Self-pay

## 2017-05-13 ENCOUNTER — Encounter: Payer: Self-pay | Admitting: Physician Assistant

## 2017-05-13 VITALS — BP 147/85 | HR 84 | Resp 18 | Ht 72.0 in | Wt 205.8 lb

## 2017-05-13 DIAGNOSIS — J302 Other seasonal allergic rhinitis: Secondary | ICD-10-CM | POA: Diagnosis not present

## 2017-05-13 MED ORDER — METHYLPREDNISOLONE ACETATE 80 MG/ML IJ SUSP
80.0000 mg | Freq: Once | INTRAMUSCULAR | Status: AC
  Administered 2017-05-13: 80 mg via INTRAMUSCULAR

## 2017-05-13 MED ORDER — LEVOCETIRIZINE DIHYDROCHLORIDE 5 MG PO TABS: 5.0000 mg | ORAL_TABLET | Freq: Every evening | ORAL | 1 refills | Status: DC

## 2017-05-13 NOTE — Progress Notes (Signed)
05/13/2017 1:42 PM   DOB: 06-20-1935 / MRN: [id number]  SUBJECTIVE:  Douglas [name] is a 82 y.o. male presenting for sneezing and eye watering over the last 3 weeks.  Associates cough.  Takes PPI and denies any GERD like symptoms. He takes claritin or allegra daily along with flonase.  He is not missing doses.   He has No Known Allergies.   He  has a past medical history of Adenomatous colon polyp (03/2001), Allergy, Cancer (Douglas [name]), Cataract, Dizziness, GERD (gastroesophageal reflux disease), Hyperlipidemia, and Low testosterone.    He  reports that  has never smoked. he has never used smokeless tobacco. He reports that he does not drink alcohol or use drugs. He  reports that he currently engages in sexual activity. The patient  has a past surgical history that includes Spine surgery; Appendectomy; Eye surgery; Prostate surgery; Colonoscopy; and Polypectomy.  His family history includes Cancer (age of onset: 36) in his father; Other in his mother; Prostate cancer in his brother.  Review of Systems  Constitutional: Negative for chills, diaphoresis and fever.  [name]: Negative for sore throat.   Respiratory: Positive for cough. Negative for hemoptysis, sputum production, shortness of breath and wheezing.   Cardiovascular: Negative for chest pain, orthopnea and leg swelling.  Gastrointestinal: Negative for nausea.  Skin: Negative for rash.  Neurological: Negative for dizziness.    The problem list and medications were reviewed and updated by myself where necessary and exist elsewhere in the encounter.   OBJECTIVE:  BP (!) 147/85 (BP Location: Right Arm, Patient Position: Sitting, Cuff Size: Normal)   Pulse 84   Resp 18   Ht 6' (1.829 m)   Wt 205 lb 12.8 oz (93.4 kg)   SpO2 94%   BMI 27.91 kg/m   Physical Exam  Constitutional: He appears well-developed. He is active and cooperative.  Non-toxic appearance.  [name]:  Right Ear: Hearing, tympanic membrane, external ear and ear canal  normal.  Left Ear: Hearing, tympanic membrane, external ear and ear canal normal.  Nose: Nose normal. Right sinus exhibits no maxillary sinus tenderness and no frontal sinus tenderness. Left sinus exhibits no maxillary sinus tenderness and no frontal sinus tenderness.  Mouth/Throat: Uvula is midline, oropharynx is clear and moist and mucous membranes are normal. No oropharyngeal exudate, posterior oropharyngeal edema or tonsillar abscesses.  Eyes: Conjunctivae are normal. Pupils are equal, round, and reactive to light.  Cardiovascular: Normal rate, regular rhythm, S1 normal, S2 normal, normal heart sounds, intact distal pulses and normal pulses. Exam reveals no gallop and no friction rub.  No murmur heard. Pulmonary/Chest: Effort normal. No stridor. No tachypnea. No respiratory distress. He has no wheezes. He has no rales.  Abdominal: He exhibits no distension.  Musculoskeletal: He exhibits no edema.  Lymphadenopathy:       Head (right side): No submandibular and no tonsillar adenopathy present.       Head (left side): No submandibular and no tonsillar adenopathy present.    He has no cervical adenopathy.  Neurological: He is alert.  Skin: Skin is warm and dry. He is not diaphoretic. No pallor.  Vitals reviewed.   No results found for this or any previous visit (from the past 72 hour(s)).  No results found.  ASSESSMENT AND PLAN:  Douglas [name] was seen today for cough and sinus problem.  Diagnoses and all orders for this visit:  Seasonal allergies: Patient with some intractable allergy symptoms for about 3 weeks now.  He is coughing and  his eyes are watering in the room.  He is sneezing in the room.  He appears to be miserable.  Changing him to Xyzal advising him to continue his Flonase and providing him with a Depo-Medrol shot.  He has some mildly elevated A1c's that are current in the system. -     methylPREDNISolone acetate (DEPO-MEDROL) injection 80 mg -     levocetirizine (XYZAL) 5 MG  tablet; Take 1 tablet (5 mg total) by mouth every evening.  Other orders -     Discontinue: levocetirizine (XYZAL) 5 MG tablet; Take 1 tablet (5 mg total) by mouth every evening. Start this medication instead of allegra or claritin when your allergies are out of control.    The patient is advised to call or return to clinic if he does not see an improvement in symptoms, or to seek the care of the closest emergency department if he worsens with the above plan.   Philis Douglas [name], MHS, PA-C Primary [facility] at [facility] 05/13/2017 1:42 PM

## 2017-05-13 NOTE — Patient Instructions (Addendum)
  The steroid shot should really calm down your system and thus calm down your allergies within the next 24 hours.  Please hold your Allegra/Claritin and start taking the Xyzal at night.  Please continue your intranasal steroid.  I hope you get to feeling better soon.   IF you received an x-ray today, you will receive an invoice from [facility]. Please contact [facility] at [phone] with questions or concerns regarding your invoice.   IF you received labwork today, you will receive an invoice from [place]. Please contact [facility] at [phone] with questions or concerns regarding your invoice.   Our billing staff will not be able to assist you with questions regarding bills from these companies.  You will be contacted with the lab results as soon as they are available. The fastest way to get your results is to activate your My Chart account. Instructions are located on the last page of this paperwork. If you have not heard from [place] regarding the results in 2 weeks, please contact this office.

## 2017-05-14 ENCOUNTER — Encounter: Payer: Self-pay | Admitting: Gastroenterology

## 2017-05-21 ENCOUNTER — Ambulatory Visit (INDEPENDENT_AMBULATORY_CARE_PROVIDER_SITE_OTHER): Payer: Medicare Other | Admitting: *Deleted

## 2017-05-21 DIAGNOSIS — E538 Deficiency of other specified B group vitamins: Secondary | ICD-10-CM | POA: Diagnosis not present

## 2017-05-26 ENCOUNTER — Other Ambulatory Visit: Payer: Self-pay | Admitting: Family Medicine

## 2017-05-26 ENCOUNTER — Other Ambulatory Visit: Payer: Self-pay | Admitting: Gastroenterology

## 2017-05-26 DIAGNOSIS — E785 Hyperlipidemia, unspecified: Secondary | ICD-10-CM

## 2017-06-11 ENCOUNTER — Other Ambulatory Visit: Payer: Self-pay

## 2017-06-11 DIAGNOSIS — J302 Other seasonal allergic rhinitis: Secondary | ICD-10-CM

## 2017-06-11 MED ORDER — LEVOCETIRIZINE DIHYDROCHLORIDE 5 MG PO TABS
5.0000 mg | ORAL_TABLET | Freq: Every evening | ORAL | 1 refills | Status: DC
Start: 2017-06-11 — End: 2017-08-11

## 2017-06-18 ENCOUNTER — Ambulatory Visit (INDEPENDENT_AMBULATORY_CARE_PROVIDER_SITE_OTHER): Payer: Medicare Other

## 2017-06-18 DIAGNOSIS — E538 Deficiency of other specified B group vitamins: Secondary | ICD-10-CM | POA: Diagnosis not present

## 2017-06-19 ENCOUNTER — Other Ambulatory Visit: Payer: Self-pay

## 2017-07-08 ENCOUNTER — Encounter: Payer: Self-pay | Admitting: Physician Assistant

## 2017-07-13 DIAGNOSIS — H16223 Keratoconjunctivitis sicca, not specified as Sjogren's, bilateral: Secondary | ICD-10-CM | POA: Diagnosis not present

## 2017-07-13 DIAGNOSIS — H5712 Ocular pain, left eye: Secondary | ICD-10-CM | POA: Diagnosis not present

## 2017-07-13 DIAGNOSIS — H401131 Primary open-angle glaucoma, bilateral, mild stage: Secondary | ICD-10-CM | POA: Diagnosis not present

## 2017-07-13 DIAGNOSIS — H40052 Ocular hypertension, left eye: Secondary | ICD-10-CM | POA: Diagnosis not present

## 2017-07-16 ENCOUNTER — Ambulatory Visit (INDEPENDENT_AMBULATORY_CARE_PROVIDER_SITE_OTHER): Payer: Medicare Other | Admitting: *Deleted

## 2017-07-16 DIAGNOSIS — E538 Deficiency of other specified B group vitamins: Secondary | ICD-10-CM

## 2017-08-03 DIAGNOSIS — H401122 Primary open-angle glaucoma, left eye, moderate stage: Secondary | ICD-10-CM | POA: Diagnosis not present

## 2017-08-03 DIAGNOSIS — H16223 Keratoconjunctivitis sicca, not specified as Sjogren's, bilateral: Secondary | ICD-10-CM | POA: Diagnosis not present

## 2017-08-03 DIAGNOSIS — H401111 Primary open-angle glaucoma, right eye, mild stage: Secondary | ICD-10-CM | POA: Diagnosis not present

## 2017-08-03 DIAGNOSIS — H01009 Unspecified blepharitis unspecified eye, unspecified eyelid: Secondary | ICD-10-CM | POA: Diagnosis not present

## 2017-08-11 ENCOUNTER — Ambulatory Visit (INDEPENDENT_AMBULATORY_CARE_PROVIDER_SITE_OTHER): Payer: Medicare Other | Admitting: Family Medicine

## 2017-08-11 ENCOUNTER — Encounter: Payer: Self-pay | Admitting: Family Medicine

## 2017-08-11 ENCOUNTER — Other Ambulatory Visit: Payer: Self-pay

## 2017-08-11 VITALS — BP 140/60 | HR 80 | Temp 97.8°F | Ht 71.0 in | Wt 202.4 lb

## 2017-08-11 DIAGNOSIS — E785 Hyperlipidemia, unspecified: Secondary | ICD-10-CM | POA: Diagnosis not present

## 2017-08-11 DIAGNOSIS — E119 Type 2 diabetes mellitus without complications: Secondary | ICD-10-CM

## 2017-08-11 MED ORDER — SIMVASTATIN 20 MG PO TABS: ORAL_TABLET | ORAL | 2 refills | Status: DC

## 2017-08-11 NOTE — Patient Instructions (Addendum)
Thanks for coming in today. No changes for now.   IF you received an x-ray today, you will receive an invoice from [facility]. Please contact [facility] at [phone] with questions or concerns regarding your invoice.   IF you received labwork today, you will receive an invoice from [place]. Please contact [facility] at [phone] with questions or concerns regarding your invoice.   Our billing staff will not be able to assist you with questions regarding bills from these companies.  You will be contacted with the lab results as soon as they are available. The fastest way to get your results is to activate your My Chart account. Instructions are located on the last page of this paperwork. If you have not heard from [place] regarding the results in 2 weeks, please contact this office.

## 2017-08-11 NOTE — Progress Notes (Signed)
Subjective:  By signing my name below, I, Essence Howell, attest that this documentation has been prepared under the direction and in the presence of [name], MD Electronically Signed: [name], ED Scribe 08/11/2017 at 9:55 AM.   Patient ID: Douglas [name], male    DOB: February 19, 1936, 82 y.o.   MRN: [id number]  Chief Complaint  Patient presents with  . Diabetes    6 m follow up   HPI Douglas [name] is a 82 y.o. male who presents to Primary Care at [facility] for f/u on DM. H/o DM that has been diet controlled in the past. Urine micro albumin 05/2016 that was normal. Borderline low calcium on Nov labs, in for rpt testing. Optho: 07/04/15 that has been postponed until Aug of this yr. Pt states that he was last seen 5/6 by Dr. Baldemar Lenis. Foot exam: 09/02/16. Pneumovax: 04/24/14. Pt is fasting at this visit. Lab Results  Component Value Date   HGBA1C 6.8 (H) 02/05/2017   Wt Readings from Last 3 Encounters:  08/11/17 202 lb 6.4 oz (91.8 kg)  05/13/17 205 lb 12.8 oz (93.4 kg)  04/30/17 204 lb (92.5 kg)  Body mass index is 28.23 kg/m.   Hyperlipidemia Lab Results  Component Value Date   CHOL 123 02/05/2017   HDL 42 02/05/2017   LDLCALC 65 02/05/2017   TRIG 82 02/05/2017   CHOLHDL 2.9 02/05/2017   Lab Results  Component Value Date   ALT 14 02/05/2017   AST 16 02/05/2017   ALKPHOS 63 02/05/2017   BILITOT 0.6 02/05/2017  Zocor 20 mg qd. - Denies myalgias or any other symptoms.  GERD Protonix 20 mg qd. Pt is being treated for B12 deficiency by neuro. - Denies blood in stools, melena, any symptoms.  Patient Active Problem List   Diagnosis Date Noted  . Vitamin B12 deficiency 03/16/2017  . B12 deficiency 12/16/2016  . Dizziness 12/15/2016  . Nonintractable headache 12/15/2016  . Bladder outlet obstruction 04/25/2015  . Erectile dysfunction due to arterial insufficiency 04/25/2015  . Osteoporosis 04/25/2015  . Vitamin D deficiency 04/25/2015  . Heme positive stool  03/16/2014  . Abdominal pain, epigastric 03/16/2014  . Prostate cancer ([place]) 05/30/2011  . Dyslipidemia 05/30/2011  . Hypogonadism male 05/30/2011  . Acute sinusitis 01/07/2007  . RHINITIS 08/25/2006   Past Medical History:  Diagnosis Date  . Adenomatous colon polyp 03/2001  . Allergy    SEASONAL  . Cancer ([place])   . Cataract   . Dizziness   . GERD (gastroesophageal reflux disease)   . Hyperlipidemia   . Low testosterone    Past Surgical History:  Procedure Laterality Date  . APPENDECTOMY    . COLONOSCOPY    . EYE SURGERY    . POLYPECTOMY    . PROSTATE SURGERY    . SPINE SURGERY     No Known Allergies Prior to Admission medications   Medication Sig Start Date End Date Taking? Authorizing Provider  alfuzosin (UROXATRAL) 10 MG 24 hr tablet  11/09/14   [provider]  aspirin 81 MG tablet Take 81 mg by mouth daily.    [provider]  Cholecalciferol (VITAMIN D) 2000 UNITS tablet Take 2,000 Units by mouth daily.    [provider]  latanoprost (XALATAN) 0.005 % ophthalmic solution  07/13/17   [provider]  levocetirizine (XYZAL) 5 MG tablet Take 1 tablet (5 mg total) by mouth every evening. 06/11/17   Tereasa Coop, PA-C  omeprazole (PRILOSEC)  20 MG capsule Take 20 mg by mouth daily.    [provider]  pantoprazole (PROTONIX) 40 MG tablet TAKE 1 TABLET DAILY 05/26/17   [name], MD  RESTASIS 0.05 % ophthalmic emulsion INSTILL 1 DROP INTO BOTH EYES TWICE A DAY 07/13/17   [provider]  simvastatin (ZOCOR) 20 MG tablet TAKE 1 TABLET DAILY AT 6PM. 05/26/17   [name], MD  Testosterone 30 MG/ACT SOLN Place 30 g onto the skin.    [provider]  vitamin C (ASCORBIC ACID) 500 MG tablet Take 500 mg by mouth daily.    [provider]   Social History   Socioeconomic History  . Marital status: Married    Spouse name: Not on file  . Number of children: 3  . Years of education: 2 years [employer]    . Highest education level: Not on file  Occupational History  . Occupation: RETIRED  Social Needs  . Financial resource strain: Not on file  . Food insecurity:    Worry: Not on file    Inability: Not on file  . Transportation needs:    Medical: Not on file    Non-medical: Not on file  Tobacco Use  . Smoking status: Never Smoker  . Smokeless tobacco: Never Used  Substance and Sexual Activity  . Alcohol use: No    Alcohol/week: 0.0 oz  . Drug use: No  . Sexual activity: Yes  Lifestyle  . Physical activity:    Days per week: Not on file    Minutes per session: Not on file  . Stress: Not on file  Relationships  . Social connections:    Talks on phone: Not on file    Gets together: Not on file    Attends religious service: Not on file    Active member of club or organization: Not on file    Attends meetings of clubs or organizations: Not on file    Relationship status: Not on file  . Intimate partner violence:    Fear of current or ex partner: Not on file    Emotionally abused: Not on file    Physically abused: Not on file    Forced sexual activity: Not on file  Other Topics Concern  . Not on file  Social History Narrative   Married. Education: [employer]. Exercise: Yes.   Right-handed.   Drinks coffee daily.   Lives at home with his wife.   Review of Systems  Constitutional: Negative for fatigue and unexpected weight change.  Eyes: Negative for visual disturbance.  Respiratory: Negative for cough, chest tightness and shortness of breath.   Cardiovascular: Negative for chest pain, palpitations and leg swelling.  Gastrointestinal: Negative for abdominal pain and blood in stool.  Neurological: Negative for dizziness, light-headedness and headaches.      Objective:   Physical Exam  Constitutional: He is oriented to person, place, and time. He appears well-developed and well-nourished.  [name]:  Head: Normocephalic and atraumatic.  Eyes: Pupils are equal, round, and  reactive to light. EOM are normal.  Neck: No JVD present. Carotid bruit is not present.  Cardiovascular: Normal rate, regular rhythm and normal heart sounds.  No murmur heard. Pulmonary/Chest: Effort normal and breath sounds normal. He has no rales.  Musculoskeletal: He exhibits no edema.  Neurological: He is alert and oriented to person, place, and time.  Skin: Skin is warm and dry.  Psychiatric: He has a normal mood and affect.  Vitals reviewed.  Vitals:   08/11/17 0928  BP: 140/60  Pulse: 80  Temp: 97.8 F (36.6 C)  TempSrc: Oral  SpO2: 96%  Weight: 202 lb 6.4 oz (91.8 kg)  Height: 5\' 11"  (1.803 m)      Assessment & Plan:  Douglas [name] is a 82 y.o. male Type 2 diabetes mellitus without complication, without long-term current use of insulin ([place]) - Plan: Microalbumin, urine, Hemoglobin A1c, Comprehensive metabolic panel  - diet controlled prior. Check A1c, urine microalbumin.   Hyperlipidemia, unspecified hyperlipidemia type - Plan: Comprehensive metabolic panel, Lipid panel  - tolerating statin. No changes, labs pending.   No orders of the defined types were placed in this encounter.  Patient Instructions   Thanks for coming in today. No changes for now.   IF you received an x-ray today, you will receive an invoice from [facility]. Please contact [facility] at [phone] with questions or concerns regarding your invoice.   IF you received labwork today, you will receive an invoice from [place]. Please contact [facility] at [phone] with questions or concerns regarding your invoice.   Our billing staff will not be able to assist you with questions regarding bills from these companies.  You will be contacted with the lab results as soon as they are available. The fastest way to get your results is to activate your My Chart account. Instructions are located on the last page of this paperwork. If you have not heard from [place] regarding the results in  2 weeks, please contact this office.       I personally performed the services described in this documentation, which was scribed in my presence. The recorded information has been reviewed and considered for accuracy and completeness, addended by me as needed, and agree with information above.  Signed,   [name], MD Primary Care at [place].  08/11/17 9:59 AM

## 2017-08-12 LAB — COMPREHENSIVE METABOLIC PANEL
A/G RATIO: 1.2 (ref 1.2–2.2)
ALBUMIN: 4.1 g/dL (ref 3.5–4.7)
ALT: 20 IU/L (ref 0–44)
AST: 15 IU/L (ref 0–40)
Alkaline Phosphatase: 50 IU/L (ref 39–117)
BUN / CREAT RATIO: 11 (ref 10–24)
BUN: 12 mg/dL (ref 8–27)
Bilirubin Total: 0.6 mg/dL (ref 0.0–1.2)
CALCIUM: 9.5 mg/dL (ref 8.6–10.2)
CO2: 27 mmol/L (ref 20–29)
Chloride: 101 mmol/L (ref 96–106)
Creatinine, Ser: 1.12 mg/dL (ref 0.76–1.27)
GFR, EST AFRICAN AMERICAN: 71 mL/min/{1.73_m2} (ref >59)
GFR, EST NON AFRICAN AMERICAN: 62 mL/min/{1.73_m2} (ref >59)
GLOBULIN, TOTAL: 3.4 g/dL (ref 1.5–4.5)
Glucose: 126 mg/dL — ABNORMAL HIGH (ref 65–99)
Potassium: 4.6 mmol/L (ref 3.5–5.2)
SODIUM: 142 mmol/L (ref 134–144)
TOTAL PROTEIN: 7.5 g/dL (ref 6.0–8.5)

## 2017-08-12 LAB — MICROALBUMIN, URINE

## 2017-08-12 LAB — LIPID PANEL
CHOL/HDL RATIO: 2.9 ratio (ref 0.0–5.0)
Cholesterol, Total: 125 mg/dL (ref 100–199)
HDL: 43 mg/dL (ref >39)
LDL Calculated: 70 mg/dL (ref 0–99)
Triglycerides: 61 mg/dL (ref 0–149)
VLDL Cholesterol Cal: 12 mg/dL (ref 5–40)

## 2017-08-12 LAB — HEMOGLOBIN A1C
Est. average glucose Bld gHb Est-mCnc: 146 mg/dL
Hgb A1c MFr Bld: 6.7 % — ABNORMAL HIGH (ref 4.8–5.6)

## 2017-08-13 ENCOUNTER — Ambulatory Visit (INDEPENDENT_AMBULATORY_CARE_PROVIDER_SITE_OTHER): Payer: Medicare Other

## 2017-08-13 DIAGNOSIS — E538 Deficiency of other specified B group vitamins: Secondary | ICD-10-CM

## 2017-08-13 MED ORDER — CYANOCOBALAMIN 1000 MCG/ML IJ SOLN
1000.0000 ug | Freq: Once | INTRAMUSCULAR | Status: AC
  Administered 2017-08-13: 1000 ug via INTRAMUSCULAR

## 2017-08-14 ENCOUNTER — Other Ambulatory Visit: Payer: Self-pay | Admitting: Gastroenterology

## 2017-09-11 ENCOUNTER — Ambulatory Visit (INDEPENDENT_AMBULATORY_CARE_PROVIDER_SITE_OTHER): Payer: Medicare Other

## 2017-09-11 DIAGNOSIS — E538 Deficiency of other specified B group vitamins: Secondary | ICD-10-CM

## 2017-09-11 MED ORDER — CYANOCOBALAMIN 1000 MCG/ML IJ SOLN
1000.0000 ug | Freq: Once | INTRAMUSCULAR | Status: AC
  Administered 2017-09-11: 1000 ug via INTRAMUSCULAR

## 2017-09-17 ENCOUNTER — Other Ambulatory Visit: Payer: Self-pay | Admitting: Physician Assistant

## 2017-09-17 DIAGNOSIS — H16223 Keratoconjunctivitis sicca, not specified as Sjogren's, bilateral: Secondary | ICD-10-CM | POA: Diagnosis not present

## 2017-09-17 DIAGNOSIS — J302 Other seasonal allergic rhinitis: Secondary | ICD-10-CM

## 2017-09-17 DIAGNOSIS — H01009 Unspecified blepharitis unspecified eye, unspecified eyelid: Secondary | ICD-10-CM | POA: Diagnosis not present

## 2017-09-17 DIAGNOSIS — H401122 Primary open-angle glaucoma, left eye, moderate stage: Secondary | ICD-10-CM | POA: Diagnosis not present

## 2017-09-17 DIAGNOSIS — H401111 Primary open-angle glaucoma, right eye, mild stage: Secondary | ICD-10-CM | POA: Diagnosis not present

## 2017-10-03 ENCOUNTER — Other Ambulatory Visit: Payer: Self-pay | Admitting: Family Medicine

## 2017-10-03 DIAGNOSIS — E785 Hyperlipidemia, unspecified: Secondary | ICD-10-CM

## 2017-10-12 ENCOUNTER — Other Ambulatory Visit: Payer: Self-pay | Admitting: *Deleted

## 2017-10-12 ENCOUNTER — Ambulatory Visit (INDEPENDENT_AMBULATORY_CARE_PROVIDER_SITE_OTHER): Payer: Medicare Other | Admitting: *Deleted

## 2017-10-12 DIAGNOSIS — E538 Deficiency of other specified B group vitamins: Secondary | ICD-10-CM

## 2017-10-12 MED ORDER — CYANOCOBALAMIN 1000 MCG/ML IJ SOLN
1000.0000 ug | INTRAMUSCULAR | Status: AC
  Administered 2017-10-12 – 2018-03-15 (×6): 1000 ug via INTRAMUSCULAR

## 2017-10-19 DIAGNOSIS — E291 Testicular hypofunction: Secondary | ICD-10-CM | POA: Diagnosis not present

## 2017-10-19 DIAGNOSIS — R972 Elevated prostate specific antigen [PSA]: Secondary | ICD-10-CM | POA: Diagnosis not present

## 2017-10-22 DIAGNOSIS — C61 Malignant neoplasm of prostate: Secondary | ICD-10-CM | POA: Diagnosis not present

## 2017-10-22 DIAGNOSIS — E291 Testicular hypofunction: Secondary | ICD-10-CM | POA: Diagnosis not present

## 2017-11-12 ENCOUNTER — Ambulatory Visit (INDEPENDENT_AMBULATORY_CARE_PROVIDER_SITE_OTHER): Payer: Medicare Other | Admitting: *Deleted

## 2017-11-12 DIAGNOSIS — E538 Deficiency of other specified B group vitamins: Secondary | ICD-10-CM

## 2017-11-18 DIAGNOSIS — H401122 Primary open-angle glaucoma, left eye, moderate stage: Secondary | ICD-10-CM | POA: Diagnosis not present

## 2017-11-18 DIAGNOSIS — H35033 Hypertensive retinopathy, bilateral: Secondary | ICD-10-CM | POA: Diagnosis not present

## 2017-11-18 DIAGNOSIS — Z961 Presence of intraocular lens: Secondary | ICD-10-CM | POA: Diagnosis not present

## 2017-11-18 DIAGNOSIS — H401111 Primary open-angle glaucoma, right eye, mild stage: Secondary | ICD-10-CM | POA: Diagnosis not present

## 2017-12-14 ENCOUNTER — Ambulatory Visit (INDEPENDENT_AMBULATORY_CARE_PROVIDER_SITE_OTHER): Payer: Medicare Other | Admitting: *Deleted

## 2017-12-14 DIAGNOSIS — E538 Deficiency of other specified B group vitamins: Secondary | ICD-10-CM | POA: Diagnosis not present

## 2017-12-14 NOTE — Progress Notes (Signed)
Gave Vitamin B12 1033mcgl IM in left deltoid. Cleaned with alcohol wipe prior to injection. Band-aid applied. Pt tolerated well.

## 2017-12-21 ENCOUNTER — Ambulatory Visit (INDEPENDENT_AMBULATORY_CARE_PROVIDER_SITE_OTHER): Payer: Medicare Other

## 2017-12-21 DIAGNOSIS — Z23 Encounter for immunization: Secondary | ICD-10-CM | POA: Diagnosis not present

## 2017-12-21 NOTE — Addendum Note (Signed)
Addended by: Suszanne Finch on: 12/21/2017 11:59 AM   Modules accepted: Level of Service

## 2017-12-21 NOTE — Progress Notes (Signed)
Pt here for high dose flu injection only.

## 2018-01-13 ENCOUNTER — Ambulatory Visit (INDEPENDENT_AMBULATORY_CARE_PROVIDER_SITE_OTHER): Payer: Medicare Other

## 2018-01-13 DIAGNOSIS — E538 Deficiency of other specified B group vitamins: Secondary | ICD-10-CM

## 2018-01-16 ENCOUNTER — Other Ambulatory Visit: Payer: Self-pay | Admitting: Physician Assistant

## 2018-01-16 DIAGNOSIS — J302 Other seasonal allergic rhinitis: Secondary | ICD-10-CM

## 2018-01-17 ENCOUNTER — Other Ambulatory Visit: Payer: Self-pay

## 2018-02-11 ENCOUNTER — Encounter: Payer: Self-pay | Admitting: Family Medicine

## 2018-02-11 ENCOUNTER — Other Ambulatory Visit: Payer: Self-pay

## 2018-02-11 ENCOUNTER — Other Ambulatory Visit: Payer: Self-pay | Admitting: Family Medicine

## 2018-02-11 ENCOUNTER — Ambulatory Visit (INDEPENDENT_AMBULATORY_CARE_PROVIDER_SITE_OTHER): Payer: Medicare Other | Admitting: Family Medicine

## 2018-02-11 VITALS — BP 122/64 | HR 63 | Temp 97.8°F | Ht 72.0 in | Wt 207.4 lb

## 2018-02-11 DIAGNOSIS — H9312 Tinnitus, left ear: Secondary | ICD-10-CM | POA: Diagnosis not present

## 2018-02-11 DIAGNOSIS — E119 Type 2 diabetes mellitus without complications: Secondary | ICD-10-CM

## 2018-02-11 DIAGNOSIS — J302 Other seasonal allergic rhinitis: Secondary | ICD-10-CM

## 2018-02-11 DIAGNOSIS — E785 Hyperlipidemia, unspecified: Secondary | ICD-10-CM | POA: Diagnosis not present

## 2018-02-11 MED ORDER — SIMVASTATIN 20 MG PO TABS: ORAL_TABLET | ORAL | 2 refills | Status: DC

## 2018-02-11 NOTE — Patient Instructions (Addendum)
No change in medications for now.  See information below or ringing in the ears.  I will refer you to ear nose and throat and they will likely do hearing testing.  Background noise may be helpful as we discussed. Return to the clinic or go to the nearest emergency room if any of your symptoms worsen or new symptoms occur.  Tinnitus Tinnitus refers to hearing a sound when there is no actual source for that sound. This is often described as ringing in the ears. However, people with this condition may hear a variety of noises. A person may hear the sound in one ear or in both ears. The sounds of tinnitus can be soft, loud, or somewhere in between. Tinnitus can last for a few seconds or can be constant for days. It may go away without treatment and come back at various times. When tinnitus is constant or happens often, it can lead to other problems, such as trouble sleeping and trouble concentrating. Almost everyone experiences tinnitus at some point. Tinnitus that is long-lasting (chronic) or comes back often is a problem that may require medical attention. What are the causes? The cause of tinnitus is often not known. In some cases, it can result from other problems or conditions, including:  Exposure to loud noises from machinery, music, or other sources.  Hearing loss.  Ear or sinus infections.  Earwax buildup.  A foreign object in the ear.  Use of certain medicines.  Use of alcohol and caffeine.  High blood pressure.  Heart diseases.  Anemia.  Allergies.  Meniere disease.  Thyroid problems.  Tumors.  An enlarged part of a weakened blood vessel (aneurysm).  What are the signs or symptoms? The main symptom of tinnitus is hearing a sound when there is no source for that sound. It may sound like:  Buzzing.  Roaring.  Ringing.  Blowing air, similar to the sound heard when you listen to a seashell.  Hissing.  Whistling.  Sizzling.  Humming.  Running water.  A  sustained musical note.  How is this diagnosed? Tinnitus is diagnosed based on your symptoms. Your health care provider will do a physical exam. A comprehensive hearing exam (audiologic exam) will be done if your tinnitus:  Affects only one ear (unilateral).  Causes hearing difficulties.  Lasts 6 months or longer.  You may also need to see a health care provider who specializes in hearing disorders (audiologist). You may be asked to complete a questionnaire to determine the severity of your tinnitus. Tests may be done to help determine the cause and to rule out other conditions. These can include:  Imaging studies of your head and brain, such as: ? A CT scan. ? An MRI.  An imaging study of your blood vessels (angiogram).  How is this treated? Treating an underlying medical condition can sometimes make tinnitus go away. If your tinnitus continues, other treatments may include:  Medicines, such as certain antidepressants or sleeping aids.  Sound generators to mask the tinnitus. These include: ? Tabletop sound machines that play relaxing sounds to help you fall asleep. ? Wearable devices that fit in your ear and play sounds or music. ? A small device that uses headphones to deliver a signal embedded in music (acoustic neural stimulation). In time, this may change the pathways of your brain and make you less sensitive to tinnitus. This device is used for very severe cases when no other treatment is working.  Therapy and counseling to help you manage  the stress of living with tinnitus.  Using hearing aids or cochlear implants, if your tinnitus is related to hearing loss.  Follow these instructions at home:  When possible, avoid being in loud places and being exposed to loud sounds.  Wear hearing protection, such as earplugs, when you are exposed to loud noises.  Do not take stimulants, such as nicotine, alcohol, or caffeine.  Practice techniques for reducing stress, such as  meditation, yoga, or deep breathing.  Use a white noise machine, a humidifier, or other devices to mask the sound of tinnitus.  Sleep with your head slightly raised. This may reduce the impact of tinnitus.  Try to get plenty of rest each night. Contact a health care provider if:  You have tinnitus in just one ear.  Your tinnitus continues for 3 weeks or longer without stopping.  Home care measures are not helping.  You have tinnitus after a head injury.  You have tinnitus along with any of the following: ? Dizziness. ? Loss of balance. ? Nausea and vomiting. This information is not intended to replace advice given to you by your health care provider. Make sure you discuss any questions you have with your health care provider. Document Released: 03/17/2005 Document Revised: 11/18/2015 Document Reviewed: 08/17/2013 Elsevier Interactive Patient Education  [employer].   If you have lab work done today you will be contacted with your lab results within the next 2 weeks.  If you have not heard from [place] then please contact us. The fastest way to get your results is to register for My Chart.   IF you received an x-ray today, you will receive an invoice from [facility]. Please contact [facility] at [phone] with questions or concerns regarding your invoice.   IF you received labwork today, you will receive an invoice from [place]. Please contact [facility] at [phone] with questions or concerns regarding your invoice.   Our billing staff will not be able to assist you with questions regarding bills from these companies.  You will be contacted with the lab results as soon as they are available. The fastest way to get your results is to activate your My Chart account. Instructions are located on the last page of this paperwork. If you have not heard from [place] regarding the results in 2 weeks, please contact this office.

## 2018-02-11 NOTE — Progress Notes (Signed)
Subjective:  By signing my name below, I, [name], attest that this documentation has been prepared under the direction and in the presence of Douglas Ray, MD. Electronically Signed: [name], [place]. 02/11/2018 , 9:23 AM .  Patient was seen in Room 10 .   Patient ID: Douglas [name], male    DOB: 1936/03/14, 82 y.o.   MRN: [id number] Chief Complaint  Patient presents with  . Diabetes    101m f/u   . Hyperlipidemia   HPI Douglas [name] is a 82 y.o. male Here for follow up. He was last seen in May. He is fasting this morning.   Ringing in left ear Patient states he's been having intermittent ringing in his left ear over the past few years, gradually getting louder. He denies any hearing loss or ear pain. He denies wearing hearing aids. He doesn't recall his last hearing test. He describes ringing in his ear like white noise; mentions more prevalent at night.   Diabetes Lab Results  Component Value Date   HGBA1C 6.7 (H) 08/11/2017   He has history of diet controlled diabetes. He's on a statin. He denies chest pain, shortness of breath, lightheadedness, dizziness or headache.   Optho: reported last seen in May by Dr. Herbert [name] Pneumovax: 04/24/14 Urine micro albumin: normal on May 14th.  Foot exam: updated today.   Hyperlipidemia Lab Results  Component Value Date   CHOL 125 08/11/2017   HDL 43 08/11/2017   LDLCALC 70 08/11/2017   TRIG 61 08/11/2017   CHOLHDL 2.9 08/11/2017   Lab Results  Component Value Date   ALT 20 08/11/2017   AST 15 08/11/2017   ALKPHOS 50 08/11/2017   BILITOT 0.6 08/11/2017   He takes Zocor 20 mg qd.   GERD His GI is Dr. Fuller Plan. He takes omeprazole 20 mg qd.   Vitamin B-12 deficiency He receives B-12 injections through [facility]; last injection received on Oct 16th, 1000 mcg.    Patient Active Problem List   Diagnosis Date Noted  . Vitamin B12 deficiency 03/16/2017  . B12 deficiency 12/16/2016  . Dizziness 12/15/2016    . Nonintractable headache 12/15/2016  . Bladder outlet obstruction 04/25/2015  . Erectile dysfunction due to arterial insufficiency 04/25/2015  . Osteoporosis 04/25/2015  . Vitamin D deficiency 04/25/2015  . Heme positive stool 03/16/2014  . Abdominal pain, epigastric 03/16/2014  . Prostate cancer ([place]) 05/30/2011  . Dyslipidemia 05/30/2011  . Hypogonadism male 05/30/2011  . Acute sinusitis 01/07/2007  . RHINITIS 08/25/2006   Past Medical History:  Diagnosis Date  . Adenomatous colon polyp 03/2001  . Allergy    SEASONAL  . Cancer ([place])   . Cataract   . Dizziness   . GERD (gastroesophageal reflux disease)   . Hyperlipidemia   . Low testosterone    Past Surgical History:  Procedure Laterality Date  . APPENDECTOMY    . COLONOSCOPY    . EYE SURGERY    . POLYPECTOMY    . PROSTATE SURGERY    . SPINE SURGERY     No Known Allergies Prior to Admission medications   Medication Sig Start Date End Date Taking? Authorizing Provider  alfuzosin (UROXATRAL) 10 MG 24 hr tablet  11/09/14  Yes [provider]  aspirin 81 MG tablet Take 81 mg by mouth daily.   Yes [provider]  Cholecalciferol (VITAMIN D) 2000 UNITS tablet Take 2,000 Units by mouth daily.   Yes [provider]  levocetirizine (XYZAL) 5 MG  tablet TAKE 1 TABLET BY MOUTH EVERY DAY IN THE EVENING 01/17/18  Yes [name], MD  omeprazole (PRILOSEC) 20 MG capsule Take 20 mg by mouth daily.   Yes [provider]  pantoprazole (PROTONIX) 40 MG tablet TAKE 1 TABLET DAILY 05/26/17  Yes [name], MD  RESTASIS 0.05 % ophthalmic emulsion INSTILL 1 DROP INTO BOTH EYES TWICE A DAY 07/13/17  Yes [provider]  simvastatin (ZOCOR) 20 MG tablet TAKE 1 TABLET DAILY AT 6PM. 08/11/17  Yes [name], MD  Testosterone 30 MG/ACT SOLN Place 30 g onto the skin.   Yes [provider]   Social History   Socioeconomic History  . Marital status: Married    Spouse name:  Not on file  . Number of children: 3  . Years of education: 2 years GT  . Highest education level: Not on file  Occupational History  . Occupation: RETIRED  Social Needs  . Financial resource strain: Not on file  . Food insecurity:    Worry: Not on file    Inability: Not on file  . Transportation needs:    Medical: Not on file    Non-medical: Not on file  Tobacco Use  . Smoking status: Never Smoker  . Smokeless tobacco: Never Used  Substance and Sexual Activity  . Alcohol use: No    Alcohol/week: 0.0 standard drinks  . Drug use: No  . Sexual activity: Yes  Lifestyle  . Physical activity:    Days per week: Not on file    Minutes per session: Not on file  . Stress: Not on file  Relationships  . Social connections:    Talks on phone: Not on file    Gets together: Not on file    Attends religious service: Not on file    Active member of club or organization: Not on file    Attends meetings of clubs or organizations: Not on file    Relationship status: Not on file  . Intimate partner violence:    Fear of current or ex partner: Not on file    Emotionally abused: Not on file    Physically abused: Not on file    Forced sexual activity: Not on file  Other Topics Concern  . Not on file  Social History Narrative   Married. Education: [employer]. Exercise: Yes.   Right-handed.   Drinks coffee daily.   Lives at home with his wife.   Review of Systems  Constitutional: Negative for fatigue and unexpected weight change.  [name]: Positive for tinnitus (left ear). Negative for ear discharge, ear pain and hearing loss.   Eyes: Negative for visual disturbance.  Respiratory: Negative for cough, chest tightness and shortness of breath.   Cardiovascular: Negative for chest pain, palpitations and leg swelling.  Gastrointestinal: Negative for abdominal pain and blood in stool.  Neurological: Negative for dizziness, light-headedness and headaches.       Objective:   Physical Exam    Constitutional: He is oriented to person, place, and time. He appears well-developed and well-nourished.  [name]:  Head: Normocephalic and atraumatic.  Eyes: Pupils are equal, round, and reactive to light. EOM are normal.  Neck: No JVD present. Carotid bruit is not present.  Cardiovascular: Normal rate, regular rhythm and normal heart sounds.  No murmur heard. Pulmonary/Chest: Effort normal and breath sounds normal. He has no rales.  Musculoskeletal: He exhibits no edema.  Neurological: He is alert and oriented to person, place, and time.  Skin: Skin is warm and dry.  Psychiatric: He has a normal mood and affect.  Vitals reviewed.   Vitals:   02/11/18 0852 02/11/18 0859  BP: (!) 143/73 122/64  Pulse: 63   Temp: 97.8 F (36.6 C)   TempSrc: Oral   SpO2: 96%   Weight: 207 lb 6.4 oz (94.1 kg)   Height: 6' (1.829 m)    Diabetic Foot Exam - Simple   Simple Foot Form Diabetic Foot exam was performed with the following findings:  Yes 02/11/2018  8:57 AM  Visual Inspection No deformities, no ulcerations, no other skin breakdown bilaterally:  Yes Sensation Testing See comments:  Yes Pulse Check Posterior Tibialis and Dorsalis pulse intact bilaterally:  Yes Comments RIGHT FOOT DID NOT FEEL #9        Assessment & Plan:   STACI CARVER is a 82 y.o. male Type 2 diabetes mellitus without complication, without long-term current use of insulin ([place]) - Plan: Hemoglobin A1c  -Diet controlled previously, check A1c.  Hyperlipidemia, unspecified hyperlipidemia type - Plan: Comprehensive metabolic panel, Lipid panel, simvastatin (ZOCOR) 20 MG tablet  -  Stable, tolerating current regimen. Medications refilled. Labs pending as above.   Tinnitus aurium, left - Plan: Ambulatory referral to ENT  -Could be related to age-related hearing loss, but with reported worsening symptoms will refer to ENT for further evaluation.  Background noise discussed, handout given  Meds ordered this encounter   Medications  . simvastatin (ZOCOR) 20 MG tablet    Sig: TAKE 1 TABLET DAILY AT 6PM.    Dispense:  90 tablet    Refill:  2   Patient Instructions   No change in medications for now.  See information below or ringing in the ears.  I will refer you to ear nose and throat and they will likely do hearing testing.  Background noise may be helpful as we discussed. Return to the clinic or go to the nearest emergency room if any of your symptoms worsen or new symptoms occur.  Tinnitus Tinnitus refers to hearing a sound when there is no actual source for that sound. This is often described as ringing in the ears. However, people with this condition may hear a variety of noises. A person may hear the sound in one ear or in both ears. The sounds of tinnitus can be soft, loud, or somewhere in between. Tinnitus can last for a few seconds or can be constant for days. It may go away without treatment and come back at various times. When tinnitus is constant or happens often, it can lead to other problems, such as trouble sleeping and trouble concentrating. Almost everyone experiences tinnitus at some point. Tinnitus that is long-lasting (chronic) or comes back often is a problem that may require medical attention. What are the causes? The cause of tinnitus is often not known. In some cases, it can result from other problems or conditions, including:  Exposure to loud noises from machinery, music, or other sources.  Hearing loss.  Ear or sinus infections.  Earwax buildup.  A foreign object in the ear.  Use of certain medicines.  Use of alcohol and caffeine.  High blood pressure.  Heart diseases.  Anemia.  Allergies.  Meniere disease.  Thyroid problems.  Tumors.  An enlarged part of a weakened blood vessel (aneurysm).  What are the signs or symptoms? The main symptom of tinnitus is hearing a sound when there is no source for that sound. It may sound  like:  Buzzing.  Roaring.  Ringing.  Blowing air, similar to the sound heard when you listen to a seashell.  Hissing.  Whistling.  Sizzling.  Humming.  Running water.  A sustained musical note.  How is this diagnosed? Tinnitus is diagnosed based on your symptoms. Your health care provider will do a physical exam. A comprehensive hearing exam (audiologic exam) will be done if your tinnitus:  Affects only one ear (unilateral).  Causes hearing difficulties.  Lasts 6 months or longer.  You may also need to see a health care provider who specializes in hearing disorders (audiologist). You may be asked to complete a questionnaire to determine the severity of your tinnitus. Tests may be done to help determine the cause and to rule out other conditions. These can include:  Imaging studies of your head and brain, such as: ? A CT scan. ? An MRI.  An imaging study of your blood vessels (angiogram).  How is this treated? Treating an underlying medical condition can sometimes make tinnitus go away. If your tinnitus continues, other treatments may include:  Medicines, such as certain antidepressants or sleeping aids.  Sound generators to mask the tinnitus. These include: ? Tabletop sound machines that play relaxing sounds to help you fall asleep. ? Wearable devices that fit in your ear and play sounds or music. ? A small device that uses headphones to deliver a signal embedded in music (acoustic neural stimulation). In time, this may change the pathways of your brain and make you less sensitive to tinnitus. This device is used for very severe cases when no other treatment is working.  Therapy and counseling to help you manage the stress of living with tinnitus.  Using hearing aids or cochlear implants, if your tinnitus is related to hearing loss.  Follow these instructions at home:  When possible, avoid being in loud places and being exposed to loud sounds.  Wear hearing  protection, such as earplugs, when you are exposed to loud noises.  Do not take stimulants, such as nicotine, alcohol, or caffeine.  Practice techniques for reducing stress, such as meditation, yoga, or deep breathing.  Use a white noise machine, a humidifier, or other devices to mask the sound of tinnitus.  Sleep with your head slightly raised. This may reduce the impact of tinnitus.  Try to get plenty of rest each night. Contact a health care provider if:  You have tinnitus in just one ear.  Your tinnitus continues for 3 weeks or longer without stopping.  Home care measures are not helping.  You have tinnitus after a head injury.  You have tinnitus along with any of the following: ? Dizziness. ? Loss of balance. ? Nausea and vomiting. This information is not intended to replace advice given to you by your health care provider. Make sure you discuss any questions you have with your health care provider. Document Released: 03/17/2005 Document Revised: 11/18/2015 Document Reviewed: 08/17/2013 Elsevier Interactive Patient Education  [employer].   If you have lab work done today you will be contacted with your lab results within the next 2 weeks.  If you have not heard from [place] then please contact us. The fastest way to get your results is to register for My Chart.   IF you received an x-[name] today, you will receive an invoice from [facility]. Please contact [facility] at [phone] with questions or concerns regarding your invoice.   IF you received labwork today, you will receive an invoice from [facility].  Please contact [facility] at [phone] with questions or concerns regarding your invoice.   Our billing staff will not be able to assist you with questions regarding bills from these companies.  You will be contacted with the lab results as soon as they are available. The fastest way to get your results is to activate your My Chart account.  Instructions are located on the last page of this paperwork. If you have not heard from [place] regarding the results in 2 weeks, please contact this office.       I personally performed the services described in this documentation, which was scribed in my presence. The recorded information has been reviewed and considered for accuracy and completeness, addended by me as needed, and agree with information above.  Signed,   Douglas Ray, MD [facility].  02/11/18 1:26 PM

## 2018-02-12 ENCOUNTER — Ambulatory Visit (INDEPENDENT_AMBULATORY_CARE_PROVIDER_SITE_OTHER): Payer: Medicare Other | Admitting: *Deleted

## 2018-02-12 DIAGNOSIS — E538 Deficiency of other specified B group vitamins: Secondary | ICD-10-CM | POA: Diagnosis not present

## 2018-02-12 LAB — COMPREHENSIVE METABOLIC PANEL
ALBUMIN: 4.3 g/dL (ref 3.5–4.7)
ALT: 26 IU/L (ref 0–44)
AST: 10 IU/L (ref 0–40)
Albumin/Globulin Ratio: 1.3 (ref 1.2–2.2)
Alkaline Phosphatase: 52 IU/L (ref 39–117)
BUN / CREAT RATIO: 10 (ref 10–24)
BUN: 10 mg/dL (ref 8–27)
Bilirubin Total: 0.5 mg/dL (ref 0.0–1.2)
CO2: 26 mmol/L (ref 20–29)
CREATININE: 1.01 mg/dL (ref 0.76–1.27)
Calcium: 9.8 mg/dL (ref 8.6–10.2)
Chloride: 98 mmol/L (ref 96–106)
GFR calc non Af Amer: 70 mL/min/{1.73_m2} (ref >59)
GFR, EST AFRICAN AMERICAN: 81 mL/min/{1.73_m2} (ref >59)
GLOBULIN, TOTAL: 3.2 g/dL (ref 1.5–4.5)
GLUCOSE: 120 mg/dL — AB (ref 65–99)
Potassium: 4.5 mmol/L (ref 3.5–5.2)
Sodium: 139 mmol/L (ref 134–144)
TOTAL PROTEIN: 7.5 g/dL (ref 6.0–8.5)

## 2018-02-12 LAB — LIPID PANEL
CHOL/HDL RATIO: 3.2 ratio (ref 0.0–5.0)
Cholesterol, Total: 125 mg/dL (ref 100–199)
HDL: 39 mg/dL — ABNORMAL LOW (ref >39)
LDL CALC: 63 mg/dL (ref 0–99)
Triglycerides: 114 mg/dL (ref 0–149)
VLDL CHOLESTEROL CAL: 23 mg/dL (ref 5–40)

## 2018-02-12 LAB — HEMOGLOBIN A1C
ESTIMATED AVERAGE GLUCOSE: 140 mg/dL
HEMOGLOBIN A1C: 6.5 % — AB (ref 4.8–5.6)

## 2018-02-15 ENCOUNTER — Other Ambulatory Visit: Payer: Self-pay | Admitting: Gastroenterology

## 2018-03-03 DIAGNOSIS — H8113 Benign paroxysmal vertigo, bilateral: Secondary | ICD-10-CM | POA: Diagnosis not present

## 2018-03-03 DIAGNOSIS — H9313 Tinnitus, bilateral: Secondary | ICD-10-CM | POA: Diagnosis not present

## 2018-03-03 DIAGNOSIS — H903 Sensorineural hearing loss, bilateral: Secondary | ICD-10-CM | POA: Diagnosis not present

## 2018-03-15 ENCOUNTER — Ambulatory Visit (INDEPENDENT_AMBULATORY_CARE_PROVIDER_SITE_OTHER): Payer: Medicare Other | Admitting: *Deleted

## 2018-03-15 DIAGNOSIS — E538 Deficiency of other specified B group vitamins: Secondary | ICD-10-CM

## 2018-04-02 DIAGNOSIS — C61 Malignant neoplasm of prostate: Secondary | ICD-10-CM | POA: Diagnosis not present

## 2018-04-08 DIAGNOSIS — E291 Testicular hypofunction: Secondary | ICD-10-CM | POA: Diagnosis not present

## 2018-04-08 DIAGNOSIS — C61 Malignant neoplasm of prostate: Secondary | ICD-10-CM | POA: Diagnosis not present

## 2018-04-15 ENCOUNTER — Ambulatory Visit (INDEPENDENT_AMBULATORY_CARE_PROVIDER_SITE_OTHER): Payer: Medicare Other | Admitting: *Deleted

## 2018-04-15 DIAGNOSIS — E538 Deficiency of other specified B group vitamins: Secondary | ICD-10-CM

## 2018-04-15 MED ORDER — CYANOCOBALAMIN 1000 MCG/ML IJ SOLN
1000.0000 ug | Freq: Once | INTRAMUSCULAR | Status: AC
  Administered 2018-04-15: 1000 ug via INTRAMUSCULAR

## 2018-05-17 ENCOUNTER — Ambulatory Visit (INDEPENDENT_AMBULATORY_CARE_PROVIDER_SITE_OTHER): Payer: Medicare Other | Admitting: Neurology

## 2018-05-17 ENCOUNTER — Encounter: Payer: Self-pay | Admitting: Neurology

## 2018-05-17 ENCOUNTER — Ambulatory Visit: Payer: Medicare Other

## 2018-05-17 VITALS — BP 152/78 | HR 82 | Ht 72.0 in | Wt 209.0 lb

## 2018-05-17 DIAGNOSIS — E538 Deficiency of other specified B group vitamins: Secondary | ICD-10-CM

## 2018-05-17 DIAGNOSIS — R519 Headache, unspecified: Secondary | ICD-10-CM | POA: Insufficient documentation

## 2018-05-17 DIAGNOSIS — R51 Headache: Secondary | ICD-10-CM

## 2018-05-17 NOTE — Progress Notes (Signed)
PATIENT: Douglas [name] DOB: 05-Oct-1935  Chief Complaint  Patient presents with  . B12 Deficiency    He has been getting monthly B12 injections for the last year.  He is here to see if he is stable enough to transition to an oral supplement.  Says he feels "great" with no new concerns.     HISTORICAL  Douglas [name] is a 83 year old male, seen in refer by his ENT Dr. Erik Obey for evaluation of headache, dizziness,  His primary care physician is Dr. [name]. Initial evaluation was on December 15 2016  I reviewed and summarized the referring note from his ENT Dr. Mare Loan, he was evaluated for pressure in his forehead, vertigo, CT of the paranasal sinus severe left worse septal deviation, but no evidence of active sinusitis.  He reported a history of headaches since 2015, pressure at bilateral frontal region, occasionally with associated spinning sensation, improved after lying down resting for a while, no hearing loss, no tinnitus, recurrent every 3-4 months, lasting for a few hours  He frequently feel stuffy sensation in his nose, use nasal spray, take meclizine as needed, without significant improvement,  Laboratory evaluation in May 2018 showed A1c was mildly elevated at 6.7, BMP showed mild elevated glucose 117, ESR was 25, fasting lipid profile showed LDL 62, total cholesterol is 119.  UPDATE Mar 16 2017: He was diagnosed with glaucoma by ophthalmologist Dr. [name] in September 2018 after initial visit, is going to have glaucoma surgery on March 30, 2017, he complains of bifrontal pressure headaches, no light sensitive, no visual changes.  We have personally reviewed MRI of the brain in September 2018, mild generalized atrophy, supratentorium small vessel disease,  Laboratory evaluation in November 2018: LDL 65, mild elevated glucose at 128, A1c 6.8, normal ESR, ANA, TSH, C-reactive protein, RPR, B12 was decreased 185, he has received B12 IM supplement.   UPDATE May 17 2018: He is overall doing very well.  Has occasionally bilateral frontal tension headaches, using eyedrops for his glaucoma, continue with vitamin B12 intramuscular supplement, feeling great   REVIEW OF SYSTEMS: Full 14 system review of systems performed and notable only for ringing in ears, headaches,  ALLERGIES: No Known Allergies  HOME MEDICATIONS: Current Outpatient Medications  Medication Sig Dispense Refill  . alfuzosin (UROXATRAL) 10 MG 24 hr tablet     . aspirin 81 MG tablet Take 81 mg by mouth daily.    . Cholecalciferol (VITAMIN D) 2000 UNITS tablet Take 2,000 Units by mouth daily.    Marland Kitchen levocetirizine (XYZAL) 5 MG tablet TAKE 1 TABLET BY MOUTH EVERY DAY IN THE EVENING 90 tablet 1  . omeprazole (PRILOSEC) 20 MG capsule Take 20 mg by mouth daily.    . pantoprazole (PROTONIX) 40 MG tablet TAKE 1 TABLET DAILY 90 tablet 0  . RESTASIS 0.05 % ophthalmic emulsion INSTILL 1 DROP INTO BOTH EYES TWICE A DAY  3  . simvastatin (ZOCOR) 20 MG tablet TAKE 1 TABLET DAILY AT 6PM. 90 tablet 2  . Testosterone 30 MG/ACT SOLN Place 30 g onto the skin.     No current facility-administered medications for this visit.     PAST MEDICAL HISTORY: Past Medical History:  Diagnosis Date  . Adenomatous colon polyp 03/2001  . Allergy    SEASONAL  . Cancer ([place])   . Cataract   . Dizziness   . GERD (gastroesophageal reflux disease)   . Hyperlipidemia   . Low testosterone     PAST SURGICAL  HISTORY: Past Surgical History:  Procedure Laterality Date  . APPENDECTOMY    . COLONOSCOPY    . EYE SURGERY    . POLYPECTOMY    . PROSTATE SURGERY    . SPINE SURGERY      FAMILY HISTORY: Family History  Problem Relation Age of Onset  . Cancer Father 60       pancreatic  . Other Mother        age 61  . Prostate cancer Brother   . Colon cancer Neg Hx   . Esophageal cancer Neg Hx   . Liver cancer Neg Hx   . Pancreatic cancer Neg Hx   . Rectal cancer Neg Hx   . Stomach cancer Neg Hx     SOCIAL  HISTORY:  Social History   Socioeconomic History  . Marital status: Married    Spouse name: Not on file  . Number of children: 3  . Years of education: 2 years GT  . Highest education level: Not on file  Occupational History  . Occupation: RETIRED  Social Needs  . Financial resource strain: Not on file  . Food insecurity:    Worry: Not on file    Inability: Not on file  . Transportation needs:    Medical: Not on file    Non-medical: Not on file  Tobacco Use  . Smoking status: Never Smoker  . Smokeless tobacco: Never Used  Substance and Sexual Activity  . Alcohol use: No    Alcohol/week: 0.0 standard drinks  . Drug use: No  . Sexual activity: Yes  Lifestyle  . Physical activity:    Days per week: Not on file    Minutes per session: Not on file  . Stress: Not on file  Relationships  . Social connections:    Talks on phone: Not on file    Gets together: Not on file    Attends religious service: Not on file    Active member of club or organization: Not on file    Attends meetings of clubs or organizations: Not on file    Relationship status: Not on file  . Intimate partner violence:    Fear of current or ex partner: Not on file    Emotionally abused: Not on file    Physically abused: Not on file    Forced sexual activity: Not on file  Other Topics Concern  . Not on file  Social History Narrative   Married. Education: [employer]. Exercise: Yes.   Right-handed.   Drinks coffee daily.   Lives at home with his wife.     PHYSICAL EXAM   Vitals:   05/17/18 1130  BP: (!) 152/78  Pulse: 82  Weight: 209 lb (94.8 kg)  Height: 6' (1.829 m)    Not recorded      Body mass index is 28.35 kg/m.  PHYSICAL EXAMNIATION:  Gen: NAD, conversant, well nourised, obese, well groomed                     Cardiovascular: Regular rate rhythm, no peripheral edema, warm, nontender. Eyes: Conjunctivae clear without exudates or hemorrhage Neck: Supple, no carotid  bruits. Pulmonary: Clear to auscultation bilaterally   NEUROLOGICAL EXAM:  MENTAL STATUS: Speech:    Speech is normal; fluent and spontaneous with normal comprehension.  Cognition:     Orientation to time, place and person     Normal recent and remote memory     Normal Attention span and concentration  Normal Language, naming, repeating,spontaneous speech     Fund of knowledge   CRANIAL NERVES: CN II: Visual fields are full to confrontation.  Pupils are round equal and briskly reactive to light. CN III, IV, VI: extraocular movement are normal. No ptosis. CN V: Facial sensation is intact to pinprick in all 3 divisions bilaterally. Corneal responses are intact.  CN VII: Face is symmetric with normal eye closure and smile. CN VIII: Hearing is normal to rubbing fingers CN IX, X: Palate elevates symmetrically. Phonation is normal. CN XI: Head turning and shoulder shrug are intact CN XII: Tongue is midline with normal movements and no atrophy.  MOTOR: There is no pronator drift of out-stretched arms. Muscle bulk and tone are normal. Muscle strength is normal.  REFLEXES: Reflexes are 2+ and symmetric at the biceps, triceps, knees, and ankles. Plantar responses are flexor.  SENSORY: Intact to light touch, pinprick, positional sensation and vibratory sensation are intact in fingers and toes.  COORDINATION: Rapid alternating movements and fine finger movements are intact. There is no dysmetria on finger-to-nose and heel-knee-shin.    GAIT/STANCE: Posture is normal. Gait is steady with normal steps, base, arm swing, and turning. Heel and toe walking are normal. Tandem gait is normal.  Romberg is absent.   DIAGNOSTIC DATA (LABS, IMAGING, TESTING) - I reviewed patient records, labs, notes, testing and imaging myself where available.   ASSESSMENT AND PLAN  Douglas [name] is a 82 y.o. male   Bilateral frontal headaches headaches, dizziness  MRI of the brain showed evidence of  generalized atrophy, mild supratentorium small vessel disease  This is most related to his diagnosis of glaucoma since September 2018, which has improved with treatment,  Normal ESR C-reactive protein  Continue management with his ophthalmologist Dr. [name]  Vitamin B12 deficiency  He has received vitamin B12 supplement intramuscular  Repeat B12 and methylmalonic acid level  Will call him result, make switch to multivitamin daily if levels within normal limit   Marcial Pacas, M.D. Ph.D.  [facility] [address] [place], [place] [address] Ph: [phone] Fax: [phone]  CC: [name], MD

## 2018-05-18 ENCOUNTER — Telehealth: Payer: Self-pay | Admitting: Neurology

## 2018-05-18 NOTE — Telephone Encounter (Signed)
Please call patient, he can stop Vit B12 im supplement, just take over counter multi-Vitamins

## 2018-05-18 NOTE — Telephone Encounter (Signed)
Left patient a detailed message, with results and recommended supplement, on voicemail (ok per DPR).  Provided our number to call back with any questions.

## 2018-05-20 LAB — METHYLMALONIC ACID, SERUM: METHYLMALONIC ACID: 69 nmol/L (ref 0–378)

## 2018-05-20 LAB — VITAMIN B12

## 2018-05-24 ENCOUNTER — Telehealth: Payer: Self-pay | Admitting: Gastroenterology

## 2018-05-24 MED ORDER — PANTOPRAZOLE SODIUM 40 MG PO TBEC: 40.0000 mg | DELAYED_RELEASE_TABLET | Freq: Every day | ORAL | 0 refills | Status: DC

## 2018-05-24 NOTE — Telephone Encounter (Signed)
Pt is sched with provider on 06/14/2018 pt need refill   [facility] [address] [place], [place] [address]  [phone]

## 2018-05-24 NOTE — Telephone Encounter (Signed)
Prescription sent to patient's pharmacy and notified to keep appt for further refills

## 2018-05-26 ENCOUNTER — Ambulatory Visit: Payer: Self-pay | Admitting: Neurology

## 2018-06-14 ENCOUNTER — Ambulatory Visit (INDEPENDENT_AMBULATORY_CARE_PROVIDER_SITE_OTHER): Payer: Medicare Other | Admitting: Gastroenterology

## 2018-06-14 ENCOUNTER — Encounter: Payer: Self-pay | Admitting: Gastroenterology

## 2018-06-14 ENCOUNTER — Other Ambulatory Visit: Payer: Self-pay

## 2018-06-14 VITALS — BP 160/70 | HR 78 | Temp 99.3°F | Ht 72.0 in | Wt 209.0 lb

## 2018-06-14 DIAGNOSIS — Z86010 Personal history of colonic polyps: Secondary | ICD-10-CM | POA: Diagnosis not present

## 2018-06-14 DIAGNOSIS — K219 Gastro-esophageal reflux disease without esophagitis: Secondary | ICD-10-CM | POA: Diagnosis not present

## 2018-06-14 MED ORDER — PANTOPRAZOLE SODIUM 40 MG PO TBEC: 40.0000 mg | DELAYED_RELEASE_TABLET | Freq: Every day | ORAL | 11 refills | Status: DC

## 2018-06-14 NOTE — Progress Notes (Signed)
    History of Present Illness: This is an 83 year old male returning for follow-up of GERD.  He states his reflux symptoms are well controlled although he occasionally notes left chest discomfort in bed at night particularly when he lays on his left side.  He feels this is reflux related.  Current Medications, Allergies, Past Medical History, Past Surgical History, Family History and Social History were reviewed in [employer] record.  Physical Exam: General: Well developed, well nourished, no acute distress Head: Normocephalic and atraumatic Eyes:  sclerae anicteric, EOMI Ears: Normal auditory acuity Mouth: No deformity or lesions Lungs: Clear throughout to auscultation Heart: Regular rate and rhythm; no murmurs, rubs or bruits Abdomen: Soft, non tender and non distended. No masses, hepatosplenomegaly or hernias noted. Normal Bowel sounds Rectal: Not done Musculoskeletal: Symmetrical with no gross deformities  Pulses:  Normal pulses noted Extremities: No clubbing, cyanosis, edema or deformities noted Neurological: Alert oriented x 4, grossly nonfocal Psychological:  Alert and cooperative. Normal mood and affect   Assessment and Recommendations:  1. GERD, erosive gastritis.  Follow standard antireflux measures.  Begin the use of 4 inch bed blocks.  Continue pantoprazole 40 mg daily.  2.  History of multiple adenomatous colon polyps, > 10.  Although he is 75 his health is good and he has precancerous polyp history is strong so we will plan for colonoscopy at 3 year interval in 03/2020.

## 2018-06-14 NOTE — Patient Instructions (Signed)
We have sent the following medications to your pharmacy for you to pick up at your convenience: pantoprazole.   To help prevent the possible spread of infection to our patients, communities, and staff; we will be implementing the following measures:  Please only allow one visitor/family member to accompany you to any upcoming appointments with [facility]. If you have any concerns about this please contact our office to discuss prior to the appointment.   Thank you for choosing me and [facility].  Pricilla Riffle. Dagoberto Ligas., MD., [facility]

## 2018-06-28 ENCOUNTER — Telehealth: Payer: Self-pay | Admitting: Family Medicine

## 2018-06-28 DIAGNOSIS — H401122 Primary open-angle glaucoma, left eye, moderate stage: Secondary | ICD-10-CM | POA: Diagnosis not present

## 2018-06-28 DIAGNOSIS — H401111 Primary open-angle glaucoma, right eye, mild stage: Secondary | ICD-10-CM | POA: Diagnosis not present

## 2018-06-28 DIAGNOSIS — H5712 Ocular pain, left eye: Secondary | ICD-10-CM | POA: Diagnosis not present

## 2018-06-28 DIAGNOSIS — H21562 Pupillary abnormality, left eye: Secondary | ICD-10-CM | POA: Diagnosis not present

## 2018-08-12 ENCOUNTER — Ambulatory Visit: Payer: Medicare Other | Admitting: Family Medicine

## 2018-08-17 ENCOUNTER — Ambulatory Visit: Payer: Medicare Other | Admitting: Family Medicine

## 2018-08-18 ENCOUNTER — Other Ambulatory Visit: Payer: Self-pay

## 2018-08-18 ENCOUNTER — Ambulatory Visit: Payer: Medicare Other

## 2018-08-18 DIAGNOSIS — E119 Type 2 diabetes mellitus without complications: Secondary | ICD-10-CM

## 2018-08-18 DIAGNOSIS — E785 Hyperlipidemia, unspecified: Secondary | ICD-10-CM

## 2018-08-19 LAB — COMPREHENSIVE METABOLIC PANEL
ALT: 22 IU/L (ref 0–44)
AST: 16 IU/L (ref 0–40)
Albumin/Globulin Ratio: 1.5 (ref 1.2–2.2)
Albumin: 4.5 g/dL (ref 3.6–4.6)
Alkaline Phosphatase: 48 IU/L (ref 39–117)
BUN/Creatinine Ratio: 10 (ref 10–24)
BUN: 11 mg/dL (ref 8–27)
Bilirubin Total: 0.7 mg/dL (ref 0.0–1.2)
CO2: 24 mmol/L (ref 20–29)
Calcium: 10 mg/dL (ref 8.6–10.2)
Chloride: 104 mmol/L (ref 96–106)
Creatinine, Ser: 1.14 mg/dL (ref 0.76–1.27)
GFR calc Af Amer: 69 mL/min/{1.73_m2} (ref >59)
GFR calc non Af Amer: 60 mL/min/{1.73_m2} (ref >59)
Globulin, Total: 3 g/dL (ref 1.5–4.5)
Glucose: 113 mg/dL — ABNORMAL HIGH (ref 65–99)
Potassium: 4.7 mmol/L (ref 3.5–5.2)
Sodium: 144 mmol/L (ref 134–144)
Total Protein: 7.5 g/dL (ref 6.0–8.5)

## 2018-08-19 LAB — LIPID PANEL
Chol/HDL Ratio: 2.8 ratio (ref 0.0–5.0)
Cholesterol, Total: 105 mg/dL (ref 100–199)
HDL: 38 mg/dL — ABNORMAL LOW (ref >39)
LDL Calculated: 50 mg/dL (ref 0–99)
Triglycerides: 85 mg/dL (ref 0–149)
VLDL Cholesterol Cal: 17 mg/dL (ref 5–40)

## 2018-08-19 LAB — HEMOGLOBIN A1C
Est. average glucose Bld gHb Est-mCnc: 140 mg/dL
Hgb A1c MFr Bld: 6.5 % — ABNORMAL HIGH (ref 4.8–5.6)

## 2018-08-20 ENCOUNTER — Other Ambulatory Visit: Payer: Self-pay

## 2018-08-20 ENCOUNTER — Telehealth (INDEPENDENT_AMBULATORY_CARE_PROVIDER_SITE_OTHER): Payer: Medicare Other | Admitting: Family Medicine

## 2018-08-20 VITALS — Ht 72.0 in | Wt 205.0 lb

## 2018-08-20 DIAGNOSIS — E785 Hyperlipidemia, unspecified: Secondary | ICD-10-CM

## 2018-08-20 DIAGNOSIS — E119 Type 2 diabetes mellitus without complications: Secondary | ICD-10-CM | POA: Diagnosis not present

## 2018-08-20 DIAGNOSIS — H93A9 Pulsatile tinnitus, unspecified ear: Secondary | ICD-10-CM

## 2018-08-20 DIAGNOSIS — R200 Anesthesia of skin: Secondary | ICD-10-CM

## 2018-08-20 NOTE — Patient Instructions (Addendum)
Numbness in leg could be a pinched nerve.  We need to check that with an exam in the office in next 2 weeks.   I recommend meeting with Dr. Lucia Gaskins to discuss the heartbeat sound in ear, but will need to make sure it is not related to your blood pressure. Keep a record of your blood pressures outside of the office, and I will have the staff schedule an in person appointment in the next 2 weeks so we can evaluate symptoms in the office.   Return to the clinic or go to the nearest emergency room if any of your symptoms worsen or new symptoms occur.

## 2018-08-20 NOTE — Progress Notes (Signed)
Virtual Visit via Telephone Note  I connected with Douglas [name] on 08/20/18 at 10:53 AM by telephone and verified that I am speaking with the correct person using two identifiers.   I discussed the limitations, risks, security and privacy concerns of performing an evaluation and management service by telephone and the availability of in person appointments. I also discussed with the patient that there may be a patient responsible charge related to this service. The patient expressed understanding and agreed to proceed, consent obtained  Chief complaint: Diabetes, leg numbness, heartbeat sensation in ears.   History of Present Illness: Douglas [name] is a 83 y.o. male  Diabetes: Diet-controlled diabetes.  he is on statin Zocor. Watching diet. Staying active - walking on treadmill. Microalbumin: Normal 08/11/2017, plans on recheck at next in office visit.   Optho, foot exam, pneumovax: Ongoing follow-up with ophthalmology - appt 5/29.  Up-to-date on foot exam and Pneumovax  Lab Results  Component Value Date   HGBA1C 6.5 (H) 08/18/2018   HGBA1C 6.5 (H) 02/11/2018   HGBA1C 6.7 (H) 08/11/2017   Lab Results  Component Value Date   MICROALBUR 0.6 04/30/2015   LDLCALC 50 08/18/2018   CREATININE 1.14 08/18/2018   Hyperlipidemia:  Lab Results  Component Value Date   CHOL 105 08/18/2018   HDL 38 (L) 08/18/2018   LDLCALC 50 08/18/2018   TRIG 85 08/18/2018   CHOLHDL 2.8 08/18/2018   Lab Results  Component Value Date   ALT 22 08/18/2018   AST 16 08/18/2018   ALKPHOS 48 08/18/2018   BILITOT 0.7 08/18/2018  zocor 20mg  qd - no new side effects or myalgias.   Leg numbness; History of B12 deficiency, treated by Dr. Krista Blue with neurology, last appointment in February.  MRI of brain in 2018 showed evidence of generalized atrophy, mild supratentorial small vessel disease, plan for continued follow-up of glaucoma with ophthalmology.  Was continued on B12 supplement IM.  B12 level on  February 17 greater than 2000 with normal methylmalonic acid.  Notes leg numbness when sleeping at night at times - pinched nerve on hip? Notes numbness into leg. No problems with standing - only notes when lying on that side. No new back pain. Has not discussed with neurologist.   Tinnitus: Discussed intermittent ringing in left ear of the past few years when I saw him in November.  Symptoms were more noticeable at night.  Denied hearing loss or pain at that time, not wearing hearing aids.  I did refer him to ear nose and throat.  Seen by Dr. Lucia Gaskins 03/03/2018.  Diagnosed with noise-induced sensorineural hearing loss along with presbycusis resulting in tinnitus, mild BPPV.  Recommended masking noises, isoflavonoid, hearing protection and option of hearing aids.   Notes heartbeat sound in ear when lying down - not all the time, has noticed past few months. Still some ringing. Episodic with lying down. No new headaches. No face or other weakness.  BP Readings from Last 3 Encounters:  06/14/18 (!) 160/70  05/17/18 (!) 152/78  02/11/18 122/64  not measuring home BP.  No chest pain, new dizziness or dyspnea.     Patient Active Problem List   Diagnosis Date Noted  . Headache 05/17/2018  . Vitamin B12 deficiency 03/16/2017  . B12 deficiency 12/16/2016  . Dizziness 12/15/2016  . Nonintractable headache 12/15/2016  . Bladder outlet obstruction 04/25/2015  . Erectile dysfunction due to arterial insufficiency 04/25/2015  . Osteoporosis 04/25/2015  . Vitamin D deficiency 04/25/2015  .  Heme positive stool 03/16/2014  . Abdominal pain, epigastric 03/16/2014  . Prostate cancer ([place]) 05/30/2011  . Dyslipidemia 05/30/2011  . Hypogonadism male 05/30/2011  . Acute sinusitis 01/07/2007  . RHINITIS 08/25/2006   Past Medical History:  Diagnosis Date  . Adenomatous colon polyp 03/2001  . Allergy    SEASONAL  . Cancer ([place])   . Cataract   . Dizziness   . GERD (gastroesophageal reflux disease)   .  Hyperlipidemia   . Low testosterone    Past Surgical History:  Procedure Laterality Date  . APPENDECTOMY    . COLONOSCOPY    . EYE SURGERY    . POLYPECTOMY    . PROSTATE SURGERY    . SPINE SURGERY     No Known Allergies Prior to Admission medications   Medication Sig Start Date End Date Taking? Authorizing Provider  alfuzosin (UROXATRAL) 10 MG 24 hr tablet  11/09/14  Yes [provider]  aspirin 81 MG tablet Take 81 mg by mouth daily.   Yes [provider]  Cholecalciferol (VITAMIN D) 2000 UNITS tablet Take 2,000 Units by mouth daily.   Yes [provider]  pantoprazole (PROTONIX) 40 MG tablet Take 1 tablet (40 mg total) by mouth daily. 06/14/18  Yes [name], MD  RESTASIS 0.05 % ophthalmic emulsion INSTILL 1 DROP INTO BOTH EYES TWICE A DAY 07/13/17  Yes [provider]  simvastatin (ZOCOR) 20 MG tablet TAKE 1 TABLET DAILY AT 6PM. 02/11/18  Yes [name], MD  Testosterone 30 MG/ACT SOLN Place 30 g onto the skin.   Yes [provider]  Multiple Vitamin (MULTIVITAMIN) tablet Take 1 tablet by mouth daily.    [provider]  omeprazole (PRILOSEC) 20 MG capsule Take 20 mg by mouth daily.    [provider]   Social History   Socioeconomic History  . Marital status: Married    Spouse name: Not on file  . Number of children: 3  . Years of education: 2 years [employer]  . Highest education level: Not on file  Occupational History  . Occupation: RETIRED  Social Needs  . Financial resource strain: Not on file  . Food insecurity:    Worry: Not on file    Inability: Not on file  . Transportation needs:    Medical: Not on file    Non-medical: Not on file  Tobacco Use  . Smoking status: Never Smoker  . Smokeless tobacco: Never Used  Substance and Sexual Activity  . Alcohol use: No    Alcohol/week: 0.0 standard drinks  . Drug use: No  . Sexual activity: Yes  Lifestyle  . Physical activity:    Days per week: Not  on file    Minutes per session: Not on file  . Stress: Not on file  Relationships  . Social connections:    Talks on phone: Not on file    Gets together: Not on file    Attends religious service: Not on file    Active member of club or organization: Not on file    Attends meetings of clubs or organizations: Not on file    Relationship status: Not on file  . Intimate partner violence:    Fear of current or ex partner: Not on file    Emotionally abused: Not on file    Physically abused: Not on file    Forced sexual activity: Not on file  Other Topics Concern  . Not on file  Social History  Narrative   Married. Education: [employer]. Exercise: Yes.   Right-handed.   Drinks coffee daily.   Lives at home with his wife.     Observations/Objective: Vitals:   08/20/18 0924  Weight: 205 lb (93 kg)  Height: 6' (1.829 m)  pt reported.   No distress, normal speech, normal responses.  Assessment and Plan: Diabetes mellitus type 2, diet-controlled ([place])  -Stable, continue diet control.  Recheck levels in the next 3 to 6 months  Pulsatile tinnitus  -Prior tinnitus, now reports pulsatile component.  Recommended follow-up with previous ear nose and throat specialist as may need imaging.  RTC/ER precautions of acute changes or headaches.  Leg numbness  -Possible pinched nerve, lumbar source possible.  Positional.  Difficult to assess over phone, recommended in office visit in the next few weeks.  Avoid pressure to affected area for now as tolerated  Hyperlipidemia, unspecified hyperlipidemia type  -Tolerating Zocor, continue same.  Stable LDL.  Follow Up Instructions:  In office in next 2 weeks.   Patient Instructions  Numbness in leg could be a pinched nerve.  We need to check that with an exam in the office in next 2 weeks.   I recommend meeting with Dr. Lucia Gaskins to discuss the heartbeat sound in ear, but will need to make sure it is not related to your blood pressure. Keep a record  of your blood pressures outside of the office, and I will have the staff schedule an in person appointment in the next 2 weeks so we can evaluate symptoms in the office.   Return to the clinic or go to the nearest emergency room if any of your symptoms worsen or new symptoms occur.      I discussed the assessment and treatment plan with the patient. The patient was provided an opportunity to ask questions and all were answered. The patient agreed with the plan and demonstrated an understanding of the instructions.   The patient was advised to call back or seek an in-person evaluation if the symptoms worsen or if the condition fails to improve as anticipated.  I provided 14 minutes of non-face-to-face time during this encounter.  Signed,   [name], MD [facility].  08/20/18

## 2018-08-20 NOTE — Progress Notes (Signed)
Chief Complaint: 74mth f/u on Diabetes, lab results, X 2 mth both legs go "numb" when sleeping  Pt states that he can hear his heartbeat in his ears and this has been going on for about X mth. Pt states that he has an ophthalmology appt with Dr. [name] on Thursday

## 2018-08-26 DIAGNOSIS — H401122 Primary open-angle glaucoma, left eye, moderate stage: Secondary | ICD-10-CM | POA: Diagnosis not present

## 2018-08-26 DIAGNOSIS — H5712 Ocular pain, left eye: Secondary | ICD-10-CM | POA: Diagnosis not present

## 2018-08-26 DIAGNOSIS — H0102A Squamous blepharitis right eye, upper and lower eyelids: Secondary | ICD-10-CM | POA: Diagnosis not present

## 2018-08-26 DIAGNOSIS — H401111 Primary open-angle glaucoma, right eye, mild stage: Secondary | ICD-10-CM | POA: Diagnosis not present

## 2018-09-07 NOTE — Telephone Encounter (Signed)
patient

## 2018-10-07 DIAGNOSIS — C61 Malignant neoplasm of prostate: Secondary | ICD-10-CM | POA: Diagnosis not present

## 2018-10-07 DIAGNOSIS — E291 Testicular hypofunction: Secondary | ICD-10-CM | POA: Diagnosis not present

## 2018-10-14 DIAGNOSIS — N5201 Erectile dysfunction due to arterial insufficiency: Secondary | ICD-10-CM | POA: Diagnosis not present

## 2018-10-14 DIAGNOSIS — E291 Testicular hypofunction: Secondary | ICD-10-CM | POA: Diagnosis not present

## 2018-10-14 DIAGNOSIS — C61 Malignant neoplasm of prostate: Secondary | ICD-10-CM | POA: Diagnosis not present

## 2018-11-30 ENCOUNTER — Ambulatory Visit (INDEPENDENT_AMBULATORY_CARE_PROVIDER_SITE_OTHER): Payer: Medicare Other | Admitting: Family Medicine

## 2018-11-30 ENCOUNTER — Other Ambulatory Visit: Payer: Self-pay

## 2018-11-30 DIAGNOSIS — Z23 Encounter for immunization: Secondary | ICD-10-CM | POA: Diagnosis not present

## 2018-12-28 DIAGNOSIS — H35013 Changes in retinal vascular appearance, bilateral: Secondary | ICD-10-CM | POA: Diagnosis not present

## 2018-12-28 DIAGNOSIS — H401122 Primary open-angle glaucoma, left eye, moderate stage: Secondary | ICD-10-CM | POA: Diagnosis not present

## 2018-12-28 DIAGNOSIS — H35033 Hypertensive retinopathy, bilateral: Secondary | ICD-10-CM | POA: Diagnosis not present

## 2018-12-28 DIAGNOSIS — H401111 Primary open-angle glaucoma, right eye, mild stage: Secondary | ICD-10-CM | POA: Diagnosis not present

## 2019-01-04 ENCOUNTER — Ambulatory Visit (INDEPENDENT_AMBULATORY_CARE_PROVIDER_SITE_OTHER): Payer: Medicare Other | Admitting: Family Medicine

## 2019-01-04 VITALS — BP 160/70 | Ht 71.0 in | Wt 205.0 lb

## 2019-01-04 DIAGNOSIS — Z0000 Encounter for general adult medical examination without abnormal findings: Secondary | ICD-10-CM

## 2019-01-04 NOTE — Progress Notes (Signed)
Presents today for [employer] Visit   Date of last exam: 08/20/2018  Interpreter used for this visit? No  I connected with  Douglas [name] on 01/04/19 by a telephone enabled telemedicine application and verified that I am speaking with the correct person using two identifiers.   I discussed the limitations of evaluation and management by telemedicine. The patient expressed understanding and agreed to proceed.   Patient Care Team: [name], MD as PCP - General (Family Medicine) Monna Fam, MD as Consulting Physician (Ophthalmology) Carolan Clines, MD (Inactive) as Consulting Physician (Urology) Jodi Marble, MD as Consulting Physician (Otolaryngology)   Other items to address today:  Discussed Eye/Dental Appointment in December for Eye Discussed immunizations   Other Screening: Last screening for diabetes: 08/18/2018 Last lipid screening:  08/18/2018  ADVANCE DIRECTIVES: Discussed: yes On File: no Materials Provided: yes (mailed)  Immunization status:  Immunization History  Administered Date(s) Administered  . Fluad Quad(high Dose 65+) 11/30/2018  . Influenza Split 12/30/2010, 12/25/2011  . Influenza,inj,Quad PF,6+ Mos 12/07/2012, 12/13/2013, 12/06/2014, 12/07/2015, 12/18/2016, 12/21/2017  . Pneumococcal Conjugate-13 04/24/2014  . Pneumococcal Polysaccharide-23 05/31/2001, 06/08/2009  . Td 06/12/2008  . Zoster 03/31/2008     Health Maintenance Due  Topic Date Due  . OPHTHALMOLOGY EXAM  07/05/2018  . URINE MICROALBUMIN  08/12/2018     Functional Status Survey: Is the patient deaf or have difficulty hearing?: No(does have ringing in ears) Does the patient have difficulty seeing, even when wearing glasses/contacts?: No Does the patient have difficulty concentrating, remembering, or making decisions?: No Does the patient have difficulty walking or climbing stairs?: No Does the patient have difficulty dressing or bathing?:  No Does the patient have difficulty doing errands alone such as visiting a doctor's office or shopping?: No   6CIT Screen 01/04/2019 11/14/2016  What Year? 0 points 0 points  What month? 0 points 0 points  What time? 0 points 0 points  Count back from 20 0 points 0 points  Months in reverse 0 points 2 points  Repeat phrase 0 points 0 points  Total Score 0 2        Clinical Support from 01/04/2019 in [facility]  AUDIT-C Score  0       Home Environment:    Lives in one story home No trouble climbing stairs No grab bars No scattered rugs Adequate lighting/no clutter   Patient Active Problem List   Diagnosis Date Noted  . Headache 05/17/2018  . Vitamin B12 deficiency 03/16/2017  . B12 deficiency 12/16/2016  . Dizziness 12/15/2016  . Nonintractable headache 12/15/2016  . Bladder outlet obstruction 04/25/2015  . Erectile dysfunction due to arterial insufficiency 04/25/2015  . Osteoporosis 04/25/2015  . Vitamin D deficiency 04/25/2015  . Heme positive stool 03/16/2014  . Abdominal pain, epigastric 03/16/2014  . Prostate cancer ([place]) 05/30/2011  . Dyslipidemia 05/30/2011  . Hypogonadism male 05/30/2011  . Acute sinusitis 01/07/2007  . RHINITIS 08/25/2006     Past Medical History:  Diagnosis Date  . Adenomatous colon polyp 03/2001  . Allergy    SEASONAL  . Cancer ([place])   . Cataract   . Dizziness   . GERD (gastroesophageal reflux disease)   . Hyperlipidemia   . Low testosterone      Past Surgical History:  Procedure Laterality Date  . APPENDECTOMY    . COLONOSCOPY    . EYE SURGERY    . POLYPECTOMY    . PROSTATE SURGERY    .  SPINE SURGERY       Family History  Problem Relation Age of Onset  . Cancer Father 78       pancreatic  . Other Mother        age 64  . Prostate cancer Brother   . Colon cancer Neg Hx   . Esophageal cancer Neg Hx   . Liver cancer Neg Hx   . Pancreatic cancer Neg Hx   . Rectal cancer Neg Hx   . Stomach cancer Neg  Hx      Social History   Socioeconomic History  . Marital status: Married    Spouse name: Not on file  . Number of children: 3  . Years of education: 2 years [employer]  . Highest education level: Not on file  Occupational History  . Occupation: RETIRED  Social Needs  . Financial resource strain: Not on file  . Food insecurity    Worry: Not on file    Inability: Not on file  . Transportation needs    Medical: Not on file    Non-medical: Not on file  Tobacco Use  . Smoking status: Never Smoker  . Smokeless tobacco: Never Used  Substance and Sexual Activity  . Alcohol use: No    Alcohol/week: 0.0 standard drinks  . Drug use: No  . Sexual activity: Yes  Lifestyle  . Physical activity    Days per week: Not on file    Minutes per session: Not on file  . Stress: Not on file  Relationships  . Social Herbalist on phone: Not on file    Gets together: Not on file    Attends religious service: Not on file    Active member of club or organization: Not on file    Attends meetings of clubs or organizations: Not on file    Relationship status: Not on file  . Intimate partner violence    Fear of current or ex partner: Not on file    Emotionally abused: Not on file    Physically abused: Not on file    Forced sexual activity: Not on file  Other Topics Concern  . Not on file  Social History Narrative   Married. Education: [employer]. Exercise: Yes.   Right-handed.   Drinks coffee daily.   Lives at home with his wife.     No Known Allergies   Prior to Admission medications   Medication Sig Start Date End Date Taking? Authorizing Provider  alfuzosin (UROXATRAL) 10 MG 24 hr tablet  11/09/14  Yes [provider]  aspirin 81 MG tablet Take 81 mg by mouth daily.   Yes [provider]  Cholecalciferol (VITAMIN D) 2000 UNITS tablet Take 2,000 Units by mouth daily.   Yes [provider]  Multiple Vitamin (MULTIVITAMIN) tablet Take 1 tablet by mouth  daily.   Yes [provider]  omeprazole (PRILOSEC) 20 MG capsule Take 20 mg by mouth daily.   Yes [provider]  pantoprazole (PROTONIX) 40 MG tablet Take 1 tablet (40 mg total) by mouth daily. 06/14/18  Yes [name], MD  RESTASIS 0.05 % ophthalmic emulsion INSTILL 1 DROP INTO BOTH EYES TWICE A DAY 07/13/17  Yes [provider]  simvastatin (ZOCOR) 20 MG tablet TAKE 1 TABLET DAILY AT 6PM. 02/11/18  Yes [name], MD  Testosterone 30 MG/ACT SOLN Place 30 g onto the skin.   Yes [provider]     Depression screen PHQ  2/9 01/04/2019 08/20/2018 02/11/2018 08/11/2017 03/17/2017  Decreased Interest 0 0 0 0 0  Down, Depressed, Hopeless 0 0 0 0 0  PHQ - 2 Score 0 0 0 0 0     Fall Risk  01/04/2019 08/20/2018 02/11/2018 08/11/2017 03/17/2017  Falls in the past year? 0 0 0 No No  Number falls in past yr: 0 0 - - -  Injury with Fall? 0 0 - - -  Follow up Falls evaluation completed;Education provided;Falls prevention discussed - - - -      PHYSICAL EXAM: BP (!) 160/70 Comment: taken from previous  Ht 5\' 11"  (1.803 m)   Wt 205 lb (93 kg) Comment: taken from previous visit  BMI 28.59 kg/m    Wt Readings from Last 3 Encounters:  01/04/19 205 lb (93 kg)  08/20/18 205 lb (93 kg)  06/14/18 209 lb (94.8 kg)    Medicare annual wellness visit, subsequent    Education/Counseling provided regarding diet and exercise, prevention of chronic diseases, smoking/tobacco cessation, if applicable, and reviewed "Covered Medicare Preventive Services."        December eye doctor

## 2019-01-04 NOTE — Patient Instructions (Signed)
Thank you for taking time to come for your Medicare Wellness Visit. I appreciate your ongoing commitment to your health goals. Please review the following plan we discussed and let me know if I can assist you in the future.  Leroy Kennedy LPN  Preventive Care 81 Years and Older, Male Preventive care refers to lifestyle choices and visits with your health care provider that can promote health and wellness. This includes:  A yearly physical exam. This is also called an annual well check.  Regular dental and eye exams.  Immunizations.  Screening for certain conditions.  Healthy lifestyle choices, such as diet and exercise. What can I expect for my preventive care visit? Physical exam Your health care provider will check:  Height and weight. These may be used to calculate body mass index (BMI), which is a measurement that tells if you are at a healthy weight.  Heart rate and blood pressure.  Your skin for abnormal spots. Counseling Your health care provider may ask you questions about:  Alcohol, tobacco, and drug use.  Emotional well-being.  Home and relationship well-being.  Sexual activity.  Eating habits.  History of falls.  Memory and ability to understand (cognition).  Work and work Statistician. What immunizations do I need?  Influenza (flu) vaccine  This is recommended every year. Tetanus, diphtheria, and pertussis (Tdap) vaccine  You may need a Td booster every 10 years. Varicella (chickenpox) vaccine  You may need this vaccine if you have not already been vaccinated. Zoster (shingles) vaccine  You may need this after age 81. Pneumococcal conjugate (PCV13) vaccine  One dose is recommended after age 81. Pneumococcal polysaccharide (PPSV23) vaccine  One dose is recommended after age 81. Measles, mumps, and rubella (MMR) vaccine  You may need at least one dose of MMR if you were born in 1957 or later. You may also need a second dose. Meningococcal  conjugate (MenACWY) vaccine  You may need this if you have certain conditions. Hepatitis A vaccine  You may need this if you have certain conditions or if you travel or work in places where you may be exposed to hepatitis A. Hepatitis B vaccine  You may need this if you have certain conditions or if you travel or work in places where you may be exposed to hepatitis B. Haemophilus influenzae type b (Hib) vaccine  You may need this if you have certain conditions. You may receive vaccines as individual doses or as more than one vaccine together in one shot (combination vaccines). Talk with your health care provider about the risks and benefits of combination vaccines. What tests do I need? Blood tests  Lipid and cholesterol levels. These may be checked every 5 years, or more frequently depending on your overall health.  Hepatitis C test.  Hepatitis B test. Screening  Lung cancer screening. You may have this screening every year starting at age 81 if you have a 30-pack-year history of smoking and currently smoke or have quit within the past 15 years.  Colorectal cancer screening. All adults should have this screening starting at age 81 and continuing until age 61. Your health care provider may recommend screening at age 81 if you are at increased risk. You will have tests every 1-10 years, depending on your results and the type of screening test.  Prostate cancer screening. Recommendations will vary depending on your family history and other risks.  Diabetes screening. This is done by checking your blood sugar (glucose) after you have not eaten for  a while (fasting). You may have this done every 1-3 years.  Abdominal aortic aneurysm (AAA) screening. You may need this if you are a current or former smoker.  Sexually transmitted disease (STD) testing. Follow these instructions at home: Eating and drinking  Eat a diet that includes fresh fruits and vegetables, whole grains, lean  protein, and low-fat dairy products. Limit your intake of foods with high amounts of sugar, saturated fats, and salt.  Take vitamin and mineral supplements as recommended by your health care provider.  Do not drink alcohol if your health care provider tells you not to drink.  If you drink alcohol: ? Limit how much you have to 0-2 drinks a day. ? Be aware of how much alcohol is in your drink. In the [place] one drink equals one 12 oz bottle of beer (355 mL), one 5 oz glass of wine (148 mL), or one 1 oz glass of hard liquor (44 mL). Lifestyle  Take daily care of your teeth and gums.  Stay active. Exercise for at least 30 minutes on 5 or more days each week.  Do not use any products that contain nicotine or tobacco, such as cigarettes, e-cigarettes, and chewing tobacco. If you need help quitting, ask your health care provider.  If you are sexually active, practice safe sex. Use a condom or other form of protection to prevent STIs (sexually transmitted infections).  Talk with your health care provider about taking a low-dose aspirin or statin. What's next?  Visit your health care provider once a year for a well check visit.  Ask your health care provider how often you should have your eyes and teeth checked.  Stay up to date on all vaccines. This information is not intended to replace advice given to you by your health care provider. Make sure you discuss any questions you have with your health care provider. Document Released: 04/13/2015 Document Revised: 03/11/2018 Document Reviewed: 03/11/2018 Elsevier Patient Education  2020 [employer].

## 2019-01-21 ENCOUNTER — Encounter (INDEPENDENT_AMBULATORY_CARE_PROVIDER_SITE_OTHER): Payer: Self-pay

## 2019-02-13 ENCOUNTER — Other Ambulatory Visit: Payer: Self-pay | Admitting: Family Medicine

## 2019-02-13 DIAGNOSIS — E785 Hyperlipidemia, unspecified: Secondary | ICD-10-CM

## 2019-02-13 NOTE — Telephone Encounter (Signed)
Prescription of simvastatin 20 mg expired on 02/11/2019.

## 2019-02-15 ENCOUNTER — Telehealth: Payer: Self-pay | Admitting: *Deleted

## 2019-02-15 NOTE — Telephone Encounter (Signed)
Prescription sent in .  Please schedule for a follow up Dr. Carlota Raspberry

## 2019-02-18 ENCOUNTER — Other Ambulatory Visit: Payer: Self-pay | Admitting: Family Medicine

## 2019-02-18 DIAGNOSIS — E785 Hyperlipidemia, unspecified: Secondary | ICD-10-CM

## 2019-02-18 NOTE — Telephone Encounter (Signed)
Forwarding medication refill request to the clinical pool for review. 

## 2019-02-18 NOTE — Telephone Encounter (Signed)
lvm for pt to call back and schedule follow up with Dr. Carlota Raspberry

## 2019-03-09 ENCOUNTER — Other Ambulatory Visit: Payer: Self-pay

## 2019-03-09 ENCOUNTER — Ambulatory Visit (INDEPENDENT_AMBULATORY_CARE_PROVIDER_SITE_OTHER): Payer: Medicare Other | Admitting: Family Medicine

## 2019-03-09 ENCOUNTER — Ambulatory Visit (INDEPENDENT_AMBULATORY_CARE_PROVIDER_SITE_OTHER): Payer: Medicare Other

## 2019-03-09 ENCOUNTER — Encounter: Payer: Self-pay | Admitting: Family Medicine

## 2019-03-09 VITALS — BP 140/76 | HR 75 | Temp 98.3°F | Wt 204.2 lb

## 2019-03-09 DIAGNOSIS — M7542 Impingement syndrome of left shoulder: Secondary | ICD-10-CM

## 2019-03-09 DIAGNOSIS — E785 Hyperlipidemia, unspecified: Secondary | ICD-10-CM | POA: Diagnosis not present

## 2019-03-09 DIAGNOSIS — M25512 Pain in left shoulder: Secondary | ICD-10-CM

## 2019-03-09 DIAGNOSIS — E119 Type 2 diabetes mellitus without complications: Secondary | ICD-10-CM | POA: Diagnosis not present

## 2019-03-09 DIAGNOSIS — E538 Deficiency of other specified B group vitamins: Secondary | ICD-10-CM

## 2019-03-09 DIAGNOSIS — M19012 Primary osteoarthritis, left shoulder: Secondary | ICD-10-CM | POA: Diagnosis not present

## 2019-03-09 MED ORDER — SIMVASTATIN 20 MG PO TABS: ORAL_TABLET | ORAL | 1 refills | Status: DC

## 2019-03-09 NOTE — Progress Notes (Signed)
Subjective:  Patient ID: Douglas [name], male    DOB: 1935/05/05  Age: 83 y.o. MRN: [id number]  CC:  Chief Complaint  Patient presents with  . Medical Management of Chronic Issues    Follow up on chronic medical condition   . Shoulder Pain    left shoulder pain been having issues for year but more some in the last 3-4 months. Usually need to get a shot possible xray    HPI Douglas [name] presents for   Diabetes: Diet controlled.  He is on statin, Zocor, aspirin 81mg .  Microalbumin: Normal May 2019 Optho, foot exam, pneumovax: Foot exam today, up-to-date otherwise. No home readings.   Diabetic Foot Exam - Simple   Simple Foot Form Diabetic Foot exam was performed with the following findings: Yes 03/09/2019  9:37 AM  Visual Inspection Sensation Testing Pulse Check Comments     Lab Results  Component Value Date   HGBA1C 6.5 (H) 08/18/2018   HGBA1C 6.5 (H) 02/11/2018   HGBA1C 6.7 (H) 08/11/2017   Lab Results  Component Value Date   MICROALBUR 0.6 04/30/2015   LDLCALC 50 08/18/2018   CREATININE 1.14 08/18/2018   Hyperlipidemia: Zocor 20 mg daily. No new myalgias except left shoulder. No missed doses.  Lab Results  Component Value Date   CHOL 105 08/18/2018   HDL 38 (L) 08/18/2018   LDLCALC 50 08/18/2018   TRIG 85 08/18/2018   CHOLHDL 2.8 08/18/2018   Lab Results  Component Value Date   ALT 22 08/18/2018   AST 16 08/18/2018   ALKPHOS 48 08/18/2018   BILITOT 0.7 08/18/2018   History of B12 deficiency, takes B12 supplementation IM in past. .  Last reading of greater than 2000 in February. No recent supplementation - about a year ago - told he could stop meds.   Left shoulder pain Ongoing symptoms for year noticed more past 3 to 4 months.  No injury,   R hand dominant primarily.  Min sx's during day - more at night.  Tx: aspirin, tylenol - no relief for months. No relief with home exercises.  impingement syndrome in 2016, subacromial injection helped at that  time. Feels similar to prior issue.     History Patient Active Problem List   Diagnosis Date Noted  . Headache 05/17/2018  . Vitamin B12 deficiency 03/16/2017  . B12 deficiency 12/16/2016  . Dizziness 12/15/2016  . Nonintractable headache 12/15/2016  . Bladder outlet obstruction 04/25/2015  . Erectile dysfunction due to arterial insufficiency 04/25/2015  . Osteoporosis 04/25/2015  . Vitamin D deficiency 04/25/2015  . Heme positive stool 03/16/2014  . Abdominal pain, epigastric 03/16/2014  . Prostate cancer ([place]) 05/30/2011  . Dyslipidemia 05/30/2011  . Hypogonadism male 05/30/2011  . Acute sinusitis 01/07/2007  . RHINITIS 08/25/2006   Past Medical History:  Diagnosis Date  . Adenomatous colon polyp 03/2001  . Allergy    SEASONAL  . Cancer ([place])   . Cataract   . Dizziness   . GERD (gastroesophageal reflux disease)   . Hearing loss   . Hyperlipidemia   . Low testosterone    Past Surgical History:  Procedure Laterality Date  . APPENDECTOMY    . COLONOSCOPY    . EYE SURGERY    . POLYPECTOMY    . PROSTATE SURGERY    . SPINE SURGERY     No Known Allergies Prior to Admission medications   Medication Sig Start Date End Date Taking? Authorizing Provider  alfuzosin (UROXATRAL) 10 MG  24 hr tablet  11/09/14  Yes [provider]  aspirin 81 MG tablet Take 81 milligrams by mouth daily.   Yes [provider]  Cholecalciferol (VITAMIN D) 2000 UNITS tablet Take 2,000 Units by mouth daily.   Yes [provider]  Multiple Vitamin (MULTIVITAMIN) tablet Take 1 tablet by mouth daily.   Yes [provider]  omeprazole (PRILOSEC) 20 MG capsule Take 20 mg by mouth daily.   Yes [provider]  pantoprazole (PROTONIX) 40 MG tablet Take 1 tablet (40 mg total) by mouth daily. 06/14/18  Yes [name], MD  RESTASIS 0.05 % ophthalmic emulsion INSTILL 1 DROP INTO BOTH EYES TWICE A DAY 07/13/17  Yes [provider]  simvastatin (ZOCOR)  20 MG tablet TAKE 1 TABLET DAILY AT 6PM. 02/21/19  Yes [name], MD  Testosterone 30 MG/ACT SOLN Place 30 g onto the skin.   Yes [provider]   Social History   Socioeconomic History  . Marital status: Married    Spouse name: Not on file  . Number of children: 3  . Years of education: 2 years [employer]  . Highest education level: Not on file  Occupational History  . Occupation: RETIRED  Social Needs  . Financial resource strain: Not on file  . Food insecurity    Worry: Not on file    Inability: Not on file  . Transportation needs    Medical: Not on file    Non-medical: Not on file  Tobacco Use  . Smoking status: Never Smoker  . Smokeless tobacco: Never Used  Substance and Sexual Activity  . Alcohol use: No    Alcohol/week: 0.0 standard drinks  . Drug use: No  . Sexual activity: Yes  Lifestyle  . Physical activity    Days per week: Not on file    Minutes per session: Not on file  . Stress: Not on file  Relationships  . Social Herbalist on phone: Not on file    Gets together: Not on file    Attends religious service: Not on file    Active member of club or organization: Not on file    Attends meetings of clubs or organizations: Not on file    Relationship status: Not on file  . Intimate partner violence    Fear of current or ex partner: Not on file    Emotionally abused: Not on file    Physically abused: Not on file    Forced sexual activity: Not on file  Other Topics Concern  . Not on file  Social History Narrative   Married. Education: [employer]. Exercise: Yes.   Right-handed.   Drinks coffee daily.   Lives at home with his wife.    Review of Systems  Constitutional: Negative for fatigue and unexpected weight change.  Eyes: Negative for visual disturbance.  Respiratory: Negative for cough, chest tightness and shortness of breath.   Cardiovascular: Negative for chest pain, palpitations and leg swelling.  Gastrointestinal: Negative for  abdominal pain and blood in stool.  Neurological: Negative for dizziness, light-headedness and headaches.     Objective:   Vitals:   03/09/19 0849 03/09/19 0851  BP: (!) 142/56 140/76  Pulse: 75   Temp: 98.3 F (36.8 C)   TempSrc: Oral   SpO2: 97%   Weight: 204 lb 3.2 oz (92.6 kg)      Physical Exam Vitals signs reviewed.  Constitutional:      Appearance: He is  well-developed.  [name]:     Head: Normocephalic and atraumatic.  Eyes:     Pupils: Pupils are equal, round, and reactive to light.  Neck:     Vascular: No carotid bruit or JVD.  Cardiovascular:     Rate and Rhythm: Normal rate and regular rhythm.     Heart sounds: Normal heart sounds. No murmur.  Pulmonary:     Effort: Pulmonary effort is normal.     Breath sounds: Normal breath sounds. No rales.  Musculoskeletal:     Left shoulder: He exhibits tenderness (Anterior shoulder, not AC.). He exhibits normal range of motion, no bony tenderness, no swelling, no spasm and normal strength (Full rotator cuff strength, negative liftoff.  Positive Neer with pain at approximately 100 degrees.  Positive [place]).     Cervical back: He exhibits normal range of motion (Equal range of motion, does not reproduce shoulder symptoms.  No midline bony tenderness.  no clavicle/[place]/AC tenderness), no tenderness and no bony tenderness.  Skin:    General: Skin is warm and dry.  Neurological:     Mental Status: He is alert and oriented to person, place, and time.    Risks (including but not limited to bleeding and infection, damage to underlying tissue/surrounding structures), benefits, and alternatives discussed for left subacromial injection, posterior approach.  Verbal consent obtained after any questions were answered. Landmarks noted, and marked as needed. Area cleansed with Betadine x3, ethyl chloride spray for topical anesthesia, followed by alcohol swab.  Injected with 3 cc lidocaine, 1 cc Kenalog 40 mg/mL.  No complications. Bandage  applied.  RTC precautions discussed in regards to injection.noticed some improvement after injection.    Assessment & Plan:  Douglas [name] is a 83 y.o. male . Diabetes mellitus type 2, diet-controlled ([place]) - Plan: Microalbumin/Creatinine Ratio, Urine, Comprehensive metabolic panel, Hemoglobin A1c, HM Diabetes Foot Exam  -Diet controlled, check labs as above.  Hyperlipidemia, unspecified hyperlipidemia type - Plan: Lipid Panel, Comprehensive metabolic panel  -Tolerating statin, continue same.  B12 deficiency - Plan: Vitamin B12  -Check levels.  Not currently on supplementation  Left shoulder pain, unspecified chronicity - Plan: DG Shoulder Left Impingement syndrome of left shoulder - Plan: DG Shoulder Left  -No known injury, exam consistent with impingement, similar symptoms in the past.  Check x-ray, but he has not had any relief with home treatments for months, elected to try injection.  Subacromial injection as above without complications, did notice improvement after injection.  Aftercare discussed with RTC precautions.  No orders of the defined types were placed in this encounter.  Patient Instructions    No change in medications at this time.  Shoulder pain does appear to be bursitis again, injection from today should help.  See information below.  Follow-up if not improving or any acute worsening.  I will let you know if any concerns on your x-ray.    Shoulder Impingement Syndrome  Shoulder impingement syndrome is a condition that causes pain when connective tissues (tendons) surrounding the shoulder joint become pinched. These tendons are part of the group of muscles and tissues that help to stabilize the shoulder (rotator cuff). Beneath the rotator cuff is a fluid-filled sac (bursa) that allows the muscles and tendons to glide smoothly. The bursa may become swollen or irritated (bursitis). Bursitis, swelling in the rotator cuff tendons, or both conditions can decrease how much  space is under a bone in the shoulder joint (acromion), resulting in impingement. What are the causes? Shoulder impingement  syndrome may be caused by bursitis or swelling of the rotator cuff tendons, which may result from:  Repetitive overhead arm movements.  Falling onto the shoulder.  Weakness in the shoulder muscles. What increases the risk? You may be more likely to develop this condition if you:  Play sports that involve throwing, such as baseball.  Participate in sports such as tennis, volleyball, and swimming.  Work as a Curator, Games developer, or Architect. Some people are also more likely to develop impingement syndrome because of the shape of their acromion bone. What are the signs or symptoms? The main symptom of this condition is pain on the front or side of the shoulder. The pain may:  Get worse when lifting or raising the arm.  Get worse at night.  Wake you up from sleeping.  Feel sharp when the shoulder is moved and then fade to an ache. Other symptoms may include:  Tenderness.  Stiffness.  Inability to raise the arm above shoulder level or behind the body.  Weakness. How is this diagnosed? This condition may be diagnosed based on:  Your symptoms and medical history.  A physical exam.  Imaging tests, such as: ? X-rays. ? MRI. ? Ultrasound. How is this treated? This condition may be treated by:  Resting your shoulder and avoiding all activities that cause pain or put stress on the shoulder.  Icing your shoulder.  NSAIDs to help reduce pain and swelling.  One or more injections of medicines to numb the area and reduce inflammation.  Physical therapy.  Surgery. This may be needed if nonsurgical treatments have not helped. Surgery may involve repairing the rotator cuff, reshaping the acromion, or removing the bursa. Follow these instructions at home: Managing pain, stiffness, and swelling   If directed, put ice on the injured area. ? Put  ice in a plastic bag. ? Place a towel between your skin and the bag. ? Leave the ice on for 20 minutes, 2-3 times a day. Activity  Rest and return to your normal activities as told by your health care provider. Ask your health care provider what activities are safe for you.  Do exercises as told by your health care provider. General instructions  Do not use any products that contain nicotine or tobacco, such as cigarettes, e-cigarettes, and chewing tobacco. These can delay healing. If you need help quitting, ask your health care provider.  Ask your health care provider when it is safe for you to drive.  Take over-the-counter and prescription medicines only as told by your health care provider.  Keep all follow-up visits as told by your health care provider. This is important. How is this prevented?  Give your body time to rest between periods of activity.  Be safe and responsible while being active. This will help you avoid falls.  Maintain physical fitness, including strength and flexibility. Contact a health care provider if:  Your symptoms have not improved after 1-2 months of treatment and rest.  You cannot lift your arm away from your body. Summary  Shoulder impingement syndrome is a condition that causes pain when connective tissues (tendons) surrounding the shoulder joint become pinched.  The main symptom of this condition is pain on the front or side of the shoulder.  This condition is usually treated with rest, ice, and pain medicines as needed. This information is not intended to replace advice given to you by your health care provider. Make sure you discuss any questions you have with your health care  provider. Document Released: 03/17/2005 Document Revised: 07/09/2018 Document Reviewed: 09/09/2017 Elsevier Patient Education  [employer].   If you have lab work done today you will be contacted with your lab results within the next 2 weeks.  If you have not  heard from [place] then please contact us. The fastest way to get your results is to register for My Chart.   IF you received an x-ray today, you will receive an invoice from [facility]. Please contact [facility] at [phone] with questions or concerns regarding your invoice.   IF you received labwork today, you will receive an invoice from [place]. Please contact [facility] at [phone] with questions or concerns regarding your invoice.   Our billing staff will not be able to assist you with questions regarding bills from these companies.  You will be contacted with the lab results as soon as they are available. The fastest way to get your results is to activate your My Chart account. Instructions are located on the last page of this paperwork. If you have not heard from [place] regarding the results in 2 weeks, please contact this office.          Signed, [name], MD [facility]

## 2019-03-09 NOTE — Patient Instructions (Addendum)
No change in medications at this time.  Shoulder pain does appear to be bursitis again, injection from today should help.  See information below.  Follow-up if not improving or any acute worsening.  I will let you know if any concerns on your x-ray.    Shoulder Impingement Syndrome  Shoulder impingement syndrome is a condition that causes pain when connective tissues (tendons) surrounding the shoulder joint become pinched. These tendons are part of the group of muscles and tissues that help to stabilize the shoulder (rotator cuff). Beneath the rotator cuff is a fluid-filled sac (bursa) that allows the muscles and tendons to glide smoothly. The bursa may become swollen or irritated (bursitis). Bursitis, swelling in the rotator cuff tendons, or both conditions can decrease how much space is under a bone in the shoulder joint (acromion), resulting in impingement. What are the causes? Shoulder impingement syndrome may be caused by bursitis or swelling of the rotator cuff tendons, which may result from:  Repetitive overhead arm movements.  Falling onto the shoulder.  Weakness in the shoulder muscles. What increases the risk? You may be more likely to develop this condition if you:  Play sports that involve throwing, such as baseball.  Participate in sports such as tennis, volleyball, and swimming.  Work as a Curator, Games developer, or Architect. Some people are also more likely to develop impingement syndrome because of the shape of their acromion bone. What are the signs or symptoms? The main symptom of this condition is pain on the front or side of the shoulder. The pain may:  Get worse when lifting or raising the arm.  Get worse at night.  Wake you up from sleeping.  Feel sharp when the shoulder is moved and then fade to an ache. Other symptoms may include:  Tenderness.  Stiffness.  Inability to raise the arm above shoulder level or behind the body.  Weakness. How is this  diagnosed? This condition may be diagnosed based on:  Your symptoms and medical history.  A physical exam.  Imaging tests, such as: ? X-rays. ? MRI. ? Ultrasound. How is this treated? This condition may be treated by:  Resting your shoulder and avoiding all activities that cause pain or put stress on the shoulder.  Icing your shoulder.  NSAIDs to help reduce pain and swelling.  One or more injections of medicines to numb the area and reduce inflammation.  Physical therapy.  Surgery. This may be needed if nonsurgical treatments have not helped. Surgery may involve repairing the rotator cuff, reshaping the acromion, or removing the bursa. Follow these instructions at home: Managing pain, stiffness, and swelling   If directed, put ice on the injured area. ? Put ice in a plastic bag. ? Place a towel between your skin and the bag. ? Leave the ice on for 20 minutes, 2-3 times a day. Activity  Rest and return to your normal activities as told by your health care provider. Ask your health care provider what activities are safe for you.  Do exercises as told by your health care provider. General instructions  Do not use any products that contain nicotine or tobacco, such as cigarettes, e-cigarettes, and chewing tobacco. These can delay healing. If you need help quitting, ask your health care provider.  Ask your health care provider when it is safe for you to drive.  Take over-the-counter and prescription medicines only as told by your health care provider.  Keep all follow-up visits as told by your health care  provider. This is important. How is this prevented?  Give your body time to rest between periods of activity.  Be safe and responsible while being active. This will help you avoid falls.  Maintain physical fitness, including strength and flexibility. Contact a health care provider if:  Your symptoms have not improved after 1-2 months of treatment and rest.  You  cannot lift your arm away from your body. Summary  Shoulder impingement syndrome is a condition that causes pain when connective tissues (tendons) surrounding the shoulder joint become pinched.  The main symptom of this condition is pain on the front or side of the shoulder.  This condition is usually treated with rest, ice, and pain medicines as needed. This information is not intended to replace advice given to you by your health care provider. Make sure you discuss any questions you have with your health care provider. Document Released: 03/17/2005 Document Revised: 07/09/2018 Document Reviewed: 09/09/2017 Elsevier Patient Education  [employer].   If you have lab work done today you will be contacted with your lab results within the next 2 weeks.  If you have not heard from [place] then please contact us. The fastest way to get your results is to register for My Chart.   IF you received an x-ray today, you will receive an invoice from [facility]. Please contact [facility] at [phone] with questions or concerns regarding your invoice.   IF you received labwork today, you will receive an invoice from [facility]. Please contact [facility] at [phone] with questions or concerns regarding your invoice.   Our billing staff will not be able to assist you with questions regarding bills from these companies.  You will be contacted with the lab results as soon as they are available. The fastest way to get your results is to activate your My Chart account. Instructions are located on the last page of this paperwork. If you have not heard from [place] regarding the results in 2 weeks, please contact this office.

## 2019-03-10 LAB — VITAMIN B12: Vitamin B-12: >2000 pg/mL — ABNORMAL HIGH (ref 232–1245)

## 2019-03-10 LAB — COMPREHENSIVE METABOLIC PANEL
ALT: 32 IU/L (ref 0–44)
AST: 20 IU/L (ref 0–40)
Albumin/Globulin Ratio: 1.6 (ref 1.2–2.2)
Albumin: 4.5 g/dL (ref 3.6–4.6)
Alkaline Phosphatase: 59 IU/L (ref 39–117)
BUN/Creatinine Ratio: 8 — ABNORMAL LOW (ref 10–24)
BUN: 10 mg/dL (ref 8–27)
Bilirubin Total: 0.4 mg/dL (ref 0.0–1.2)
CO2: 25 mmol/L (ref 20–29)
Calcium: 9.9 mg/dL (ref 8.6–10.2)
Chloride: 101 mmol/L (ref 96–106)
Creatinine, Ser: 1.21 mg/dL (ref 0.76–1.27)
GFR calc Af Amer: 65 mL/min/{1.73_m2} (ref >59)
GFR calc non Af Amer: 56 mL/min/{1.73_m2} — ABNORMAL LOW (ref >59)
Globulin, Total: 2.9 g/dL (ref 1.5–4.5)
Glucose: 128 mg/dL — ABNORMAL HIGH (ref 65–99)
Potassium: 5.2 mmol/L (ref 3.5–5.2)
Sodium: 141 mmol/L (ref 134–144)
Total Protein: 7.4 g/dL (ref 6.0–8.5)

## 2019-03-10 LAB — LIPID PANEL
Chol/HDL Ratio: 3 ratio (ref 0.0–5.0)
Cholesterol, Total: 125 mg/dL (ref 100–199)
HDL: 41 mg/dL (ref >39)
LDL Chol Calc (NIH): 67 mg/dL (ref 0–99)
Triglycerides: 88 mg/dL (ref 0–149)
VLDL Cholesterol Cal: 17 mg/dL (ref 5–40)

## 2019-03-10 LAB — HEMOGLOBIN A1C
Est. average glucose Bld gHb Est-mCnc: 137 mg/dL
Hgb A1c MFr Bld: 6.4 % — ABNORMAL HIGH (ref 4.8–5.6)

## 2019-03-10 LAB — MICROALBUMIN / CREATININE URINE RATIO
Creatinine, Urine: 175.2 mg/dL
Microalb/Creat Ratio: 4 mg/g creat (ref 0–29)
Microalbumin, Urine: 6.5 ug/mL

## 2019-04-07 DIAGNOSIS — C61 Malignant neoplasm of prostate: Secondary | ICD-10-CM | POA: Diagnosis not present

## 2019-04-14 DIAGNOSIS — N5201 Erectile dysfunction due to arterial insufficiency: Secondary | ICD-10-CM | POA: Diagnosis not present

## 2019-04-14 DIAGNOSIS — C61 Malignant neoplasm of prostate: Secondary | ICD-10-CM | POA: Diagnosis not present

## 2019-04-14 DIAGNOSIS — E291 Testicular hypofunction: Secondary | ICD-10-CM | POA: Diagnosis not present

## 2019-06-01 ENCOUNTER — Encounter: Payer: Self-pay | Admitting: Family Medicine

## 2019-06-01 DIAGNOSIS — H401122 Primary open-angle glaucoma, left eye, moderate stage: Secondary | ICD-10-CM | POA: Diagnosis not present

## 2019-06-01 DIAGNOSIS — H401111 Primary open-angle glaucoma, right eye, mild stage: Secondary | ICD-10-CM | POA: Diagnosis not present

## 2019-06-01 DIAGNOSIS — H53142 Visual discomfort, left eye: Secondary | ICD-10-CM | POA: Diagnosis not present

## 2019-06-09 ENCOUNTER — Other Ambulatory Visit: Payer: Self-pay | Admitting: Gastroenterology

## 2019-06-15 DIAGNOSIS — H401122 Primary open-angle glaucoma, left eye, moderate stage: Secondary | ICD-10-CM | POA: Diagnosis not present

## 2019-06-17 ENCOUNTER — Other Ambulatory Visit: Payer: Self-pay | Admitting: Gastroenterology

## 2019-07-07 DIAGNOSIS — H401111 Primary open-angle glaucoma, right eye, mild stage: Secondary | ICD-10-CM | POA: Diagnosis not present

## 2019-07-07 DIAGNOSIS — H401122 Primary open-angle glaucoma, left eye, moderate stage: Secondary | ICD-10-CM | POA: Diagnosis not present

## 2019-07-09 ENCOUNTER — Ambulatory Visit
Admission: EM | Admit: 2019-07-09 | Discharge: 2019-07-09 | Disposition: A | Discharge status: Home / Self Care | Payer: Medicare Other

## 2019-07-09 ENCOUNTER — Encounter: Payer: Self-pay | Admitting: Emergency Medicine

## 2019-07-09 ENCOUNTER — Ambulatory Visit (INDEPENDENT_AMBULATORY_CARE_PROVIDER_SITE_OTHER): Payer: Medicare Other

## 2019-07-09 ENCOUNTER — Other Ambulatory Visit: Payer: Self-pay

## 2019-07-09 DIAGNOSIS — Z20822 Contact with and (suspected) exposure to covid-19: Secondary | ICD-10-CM

## 2019-07-09 DIAGNOSIS — R05 Cough: Secondary | ICD-10-CM | POA: Diagnosis not present

## 2019-07-09 DIAGNOSIS — R059 Cough, unspecified: Secondary | ICD-10-CM

## 2019-07-09 DIAGNOSIS — J029 Acute pharyngitis, unspecified: Secondary | ICD-10-CM | POA: Diagnosis not present

## 2019-07-09 MED ORDER — CETIRIZINE HCL 10 MG PO TABS: 10.0000 mg | ORAL_TABLET | Freq: Every day | ORAL | 0 refills | Status: DC

## 2019-07-09 MED ORDER — BENZONATATE 100 MG PO CAPS: 100.0000 mg | ORAL_CAPSULE | Freq: Three times a day (TID) | ORAL | 0 refills | Status: DC

## 2019-07-09 MED ORDER — PREDNISONE 20 MG PO TABS: 40.0000 mg | ORAL_TABLET | Freq: Every day | ORAL | 0 refills | Status: AC

## 2019-07-09 MED ORDER — FLUTICASONE PROPIONATE 50 MCG/ACT NA SUSP: 1.0000 | Freq: Every day | NASAL | 0 refills | Status: DC

## 2019-07-09 NOTE — ED Triage Notes (Addendum)
sore throat started Monday.  Reports fever 99 At night coughing frequently, constantly clearing throat.  Chest is congested. Cough is worse at night   2nd covid vaccine was 05/30/2019

## 2019-07-09 NOTE — Discharge Instructions (Addendum)

## 2019-07-09 NOTE — ED Provider Notes (Signed)
[facility]    CSN: [id number] Arrival date & time: 07/09/19  1107      History   Chief Complaint Chief Complaint  Patient presents with  . Sore Throat    HPI Douglas [name] is a 84 y.o. male with history of prostate cancer, GERD, allergies presenting for sore throat, coughing, congestion since Monday.  Patient received second Covid vaccine 05/30/2019.  Patient states Kies dry: No blood, difficulty breathing, wheezing.  Has been using salt water gargles, Tylenol with a significant rate relief of throat pain.  Patient denies difficulty swallowing, breathing, stridor, chest pain, palpitations, fever.  No known sick contacts.   Past Medical History:  Diagnosis Date  . Adenomatous colon polyp 03/2001  . Allergy    SEASONAL  . Cancer ([place])   . Cataract   . Dizziness   . GERD (gastroesophageal reflux disease)   . Hearing loss   . Hyperlipidemia   . Low testosterone     Patient Active Problem List   Diagnosis Date Noted  . Headache 05/17/2018  . Vitamin B12 deficiency 03/16/2017  . B12 deficiency 12/16/2016  . Dizziness 12/15/2016  . Nonintractable headache 12/15/2016  . Bladder outlet obstruction 04/25/2015  . Erectile dysfunction due to arterial insufficiency 04/25/2015  . Osteoporosis 04/25/2015  . Vitamin D deficiency 04/25/2015  . Heme positive stool 03/16/2014  . Abdominal pain, epigastric 03/16/2014  . Prostate cancer ([place]) 05/30/2011  . Dyslipidemia 05/30/2011  . Hypogonadism male 05/30/2011  . Acute sinusitis 01/07/2007  . RHINITIS 08/25/2006    Past Surgical History:  Procedure Laterality Date  . APPENDECTOMY    . COLONOSCOPY    . EYE SURGERY    . POLYPECTOMY    . PROSTATE SURGERY    . SPINE SURGERY         Home Medications    Prior to Admission medications   Medication Sig Start Date End Date Taking? Authorizing Provider  acetaminophen (TYLENOL) 325 MG tablet Take 650 mg by mouth every 6 (six) hours as needed.   Yes [provider]  alfuzosin (UROXATRAL) 10 MG 24 hr tablet  11/09/14   [provider]  aspirin 81 MG tablet Take 81 mg by mouth daily.    [provider]  benzonatate (TESSALON) 100 MG capsule Take 1 capsule (100 mg total) by mouth every 8 (eight) hours. 07/09/19   Hall-Potvin, [place], PA-C  cetirizine (ZYRTEC ALLERGY) 10 MG tablet Take 1 tablet (10 mg total) by mouth daily. 07/09/19   Hall-Potvin, [place], PA-C  Cholecalciferol (VITAMIN D) 2000 UNITS tablet Take 2,000 Units by mouth daily.    [provider]  fluticasone (FLONASE) 50 MCG/ACT nasal spray Place 1 spray into both nostrils daily. 07/09/19   Hall-Potvin, [place], PA-C  Multiple Vitamin (MULTIVITAMIN) tablet Take 1 tablet by mouth daily.    [provider]  omeprazole (PRILOSEC) 20 MG capsule Take 20 mg by mouth daily.    [provider]  pantoprazole (PROTONIX) 40 MG tablet TAKE 1 TABLET BY MOUTH EVERY DAY 06/17/19   [name], MD  predniSONE (DELTASONE) 20 MG tablet Take 2 tablets (40 mg total) by mouth daily for 5 days. 07/09/19 07/14/19  Hall-Potvin, [place], PA-C  RESTASIS 0.05 % ophthalmic emulsion INSTILL 1 DROP INTO BOTH EYES TWICE A DAY 07/13/17   [provider]  simvastatin (ZOCOR) 20 MG tablet TAKE 1 TABLET DAILY AT 6PM. 03/09/19   [name], MD  Testosterone 30 MG/ACT SOLN Place 30 g  onto the skin.    [provider]    Family History Family History  Problem Relation Age of Onset  . Cancer Father 12       pancreatic  . Other Mother        age 47  . Prostate cancer Brother   . Colon cancer Neg Hx   . Esophageal cancer Neg Hx   . Liver cancer Neg Hx   . Pancreatic cancer Neg Hx   . Rectal cancer Neg Hx   . Stomach cancer Neg Hx     Social History Social History   Tobacco Use  . Smoking status: Never Smoker  . Smokeless tobacco: Never Used  Substance Use Topics  . Alcohol use: No    Alcohol/week: 0.0 standard drinks  . Drug use: No       Allergies   Patient has no known allergies.   Review of Systems As per HPI   Physical Exam Triage Vital Signs ED Triage Vitals  Enc Vitals Group     BP      Pulse      Resp      Temp      Temp src      SpO2      Weight      Height      Head Circumference      Peak Flow      Pain Score      Pain Loc      Pain Edu?      Excl. in [place]?    No data found.  Updated Vital Signs BP (!) 148/73 (BP Location: Left Arm)   Pulse 87   Temp 98.7 F (37.1 C) (Oral)   Resp 18   SpO2 94%   Visual Acuity Right Eye Distance:   Left Eye Distance:   Bilateral Distance:    Right Eye Near:   Left Eye Near:    Bilateral Near:     Physical Exam Constitutional:      General: He is not in acute distress.    Appearance: He is not ill-appearing.  [name]:     Head: Normocephalic and atraumatic.     Right Ear: Tympanic membrane, ear canal and external ear normal.     Left Ear: Tympanic membrane, ear canal and external ear normal.     Nose: No nasal deformity, congestion or rhinorrhea.     Comments: Turbinates nonedematous bilaterally with pink mucosa    Mouth/Throat:     Mouth: Mucous membranes are moist.     Tongue: Tongue does not deviate from midline.     Pharynx: Oropharynx is clear. Uvula midline. No posterior oropharyngeal erythema or uvula swelling.     Comments: No tonsillar hypertrophy or exudate Eyes:     General: No scleral icterus.    Conjunctiva/sclera: Conjunctivae normal.     Pupils: Pupils are equal, round, and reactive to light.  Neck:     Thyroid: No thyromegaly.     Comments: Trachea midline, negative JVD Cardiovascular:     Rate and Rhythm: Normal rate and regular rhythm.  Pulmonary:     Effort: Pulmonary effort is normal. No respiratory distress.     Breath sounds: No wheezing or rales.  Musculoskeletal:     Cervical back: Normal range of motion and neck supple. No muscular tenderness.  Lymphadenopathy:     Cervical: No cervical adenopathy.   Neurological:     Mental Status: He is alert.  UC Treatments / Results  Labs (all labs ordered are listed, but only abnormal results are displayed) Labs Reviewed  NOVEL CORONAVIRUS, NAA    EKG   Radiology DG Chest 2 View  Result Date: 07/09/2019 CLINICAL DATA:  Cough 5 days. EXAM: CHEST - 2 VIEW COMPARISON:  01/17/2015 FINDINGS: Lungs are adequately inflated and otherwise clear. Cardiomediastinal silhouette and remainder of the exam is unchanged. IMPRESSION: No active cardiopulmonary disease. Electronically Signed   By: Marin Olp M.D.   On: 07/09/2019 12:46    Procedures Procedures (including critical care time)  Medications Ordered in UC Medications - No data to display  Initial Impression / Assessment and Plan / UC Course  I have reviewed the triage vital signs and the nursing notes.  Pertinent labs & imaging results that were available during my care of the patient were reviewed by me and considered in my medical decision making (see chart for details).     Patient afebrile, nontoxic, with SpO2 94%.  Chest x-ray done office, reviewed by radiology: Negative for infiltrate, effusion, pneumothorax.  Relayed findings to patient who verbalized understanding.  Covid PCR pending.  Patient to quarantine until results are back.  We will continue supportive management for suspected bronchitis versus allergies versus Covid.  Return precautions discussed, patient verbalized understanding and is agreeable to plan. Final Clinical Impressions(s) / UC Diagnoses   Final diagnoses:  Cough  Sore throat     Discharge Instructions     Tessalon for cough. Start flonase, atrovent nasal spray for nasal congestion/drainage. You can use over the counter nasal saline rinse such as neti pot for nasal congestion. Keep hydrated, your urine should be clear to pale yellow in color. Tylenol/motrin for fever and pain. Monitor for any worsening of symptoms, chest pain, shortness of breath,  wheezing, swelling of the throat, go to the emergency department for further evaluation needed.     ED Prescriptions    Medication Sig Dispense Auth. Provider   benzonatate (TESSALON) 100 MG capsule Take 1 capsule (100 mg total) by mouth every 8 (eight) hours. 21 capsule Hall-Potvin, [place], PA-C   fluticasone (FLONASE) 50 MCG/ACT nasal spray Place 1 spray into both nostrils daily. 16 g Hall-Potvin, [place], PA-C   cetirizine (ZYRTEC ALLERGY) 10 MG tablet Take 1 tablet (10 mg total) by mouth daily. 30 tablet Hall-Potvin, [place], PA-C   predniSONE (DELTASONE) 20 MG tablet Take 2 tablets (40 mg total) by mouth daily for 5 days. 10 tablet Hall-Potvin, [place], PA-C     PDMP not reviewed this encounter.   Hall-Potvin, [place], [place] 07/09/19 1321

## 2019-07-10 LAB — NOVEL CORONAVIRUS, NAA: SARS-CoV-2, NAA: NOT DETECTED

## 2019-07-10 LAB — SARS-COV-2, NAA 2 DAY TAT

## 2019-07-13 ENCOUNTER — Other Ambulatory Visit: Payer: Self-pay

## 2019-07-13 ENCOUNTER — Telehealth (INDEPENDENT_AMBULATORY_CARE_PROVIDER_SITE_OTHER): Payer: Medicare Other | Admitting: Registered Nurse

## 2019-07-13 ENCOUNTER — Encounter: Payer: Self-pay | Admitting: Registered Nurse

## 2019-07-13 DIAGNOSIS — R0989 Other specified symptoms and signs involving the circulatory and respiratory systems: Secondary | ICD-10-CM

## 2019-07-13 MED ORDER — GUAIFENESIN-DM 100-10 MG/5ML PO SYRP: 5.0000 mL | ORAL_SOLUTION | ORAL | 0 refills | Status: DC | PRN

## 2019-07-13 MED ORDER — AZITHROMYCIN 250 MG PO TABS: ORAL_TABLET | ORAL | 0 refills | Status: DC

## 2019-07-13 MED ORDER — MONTELUKAST SODIUM 10 MG PO TABS: 10.0000 mg | ORAL_TABLET | Freq: Every day | ORAL | 3 refills | Status: DC

## 2019-07-13 NOTE — Patient Instructions (Signed)
° ° ° °  If you have lab work done today you will be contacted with your lab results within the next 2 weeks.  If you have not heard from us then please contact us. The fastest way to get your results is to register for My Chart. ° ° °IF you received an x-ray today, you will receive an invoice from [facility]. Please contact [facility] at [phone] with questions or concerns regarding your invoice.  ° °IF you received labwork today, you will receive an invoice from [facility]. Please contact [facility] at [phone] with questions or concerns regarding your invoice.  ° °Our billing staff will not be able to assist you with questions regarding bills from these companies. ° °You will be contacted with the lab results as soon as they are available. The fastest way to get your results is to activate your My Chart account. Instructions are located on the last page of this paperwork. If you have not heard from us regarding the results in 2 weeks, please contact this office. °  ° ° ° °

## 2019-07-22 ENCOUNTER — Telehealth (INDEPENDENT_AMBULATORY_CARE_PROVIDER_SITE_OTHER): Payer: Medicare Other | Admitting: Family Medicine

## 2019-07-22 ENCOUNTER — Other Ambulatory Visit: Payer: Self-pay

## 2019-07-22 ENCOUNTER — Encounter: Payer: Self-pay | Admitting: Family Medicine

## 2019-07-22 VITALS — Temp 98.0°F | Ht 71.0 in | Wt 201.0 lb

## 2019-07-22 DIAGNOSIS — R059 Cough, unspecified: Secondary | ICD-10-CM

## 2019-07-22 DIAGNOSIS — R05 Cough: Secondary | ICD-10-CM

## 2019-07-22 DIAGNOSIS — R0981 Nasal congestion: Secondary | ICD-10-CM

## 2019-07-22 DIAGNOSIS — R0982 Postnasal drip: Secondary | ICD-10-CM

## 2019-07-22 MED ORDER — CETIRIZINE HCL 10 MG PO TABS: 10.0000 mg | ORAL_TABLET | Freq: Every day | ORAL | 0 refills | Status: DC

## 2019-07-22 MED ORDER — IPRATROPIUM BROMIDE 0.06 % NA SOLN: 1.0000 | Freq: Four times a day (QID) | NASAL | 5 refills | Status: DC

## 2019-07-22 MED ORDER — BENZONATATE 100 MG PO CAPS: 100.0000 mg | ORAL_CAPSULE | Freq: Three times a day (TID) | ORAL | 0 refills | Status: DC

## 2019-07-22 NOTE — Progress Notes (Signed)
Virtual Visit via audio Note  I connected with [name] on 07/22/19 at 6:10 PM by phone - no video capability. Verified that I am speaking with the correct person using two identifiers.   I discussed the limitations, risks, security and privacy concerns of performing an evaluation and management service by telephone and the availability of in person appointments. I also discussed with the patient that there may be a patient responsible charge related to this service. The patient expressed understanding and agreed to proceed, consent obtained  Chief complaint:  Chief Complaint  Patient presents with  . flu like symptoms    pt states he is still having a dry cough, congestion, sore throat. pt stats this has been going on for the past 3 weeks. pt reports symtoms get worse at night.    History of Present Illness: Douglas [name] is a 84 y.o. male   Follow-up of cough, congestion, sore throat.  Seen at Urgent Care 07/09/19, sore throat, cough, congestion for 3-4 days. Covid testing negative. Treated with tessalon perles (took tid - min change in cough) , zyrtec (QD, ran out, not sure if improved), flonase (was already taking daily - 2 spr/nost qd), prednisone 40mg  qd for 5 days (took all 5 days- no relief).  Evaluated by video visit on April 14 by my colleague.  Was started on Singulair 10 mg nightly, azithromycin Z-Pak, dextromethorphan guaifenesin every 4 hours as needed.  No change in cough. Still sore throat. Some hoarseness.  Symptoms same - no worsening.  Min dyspnea only during coughing fit. Not dyspnea with his walk on treadmill 1 hour. no wheeze.  No heartburn.  Frequent clearing of throat. Feel some congestion. Using cough drop to help clear- cool mentholated. PND/scratchy throat triggers cough. Dry cough. Worse at night with lying down or prolonged talking.  Some chest and nose congestion. Occasional sneezing.  Current meds for symptoms: flonase 2spr/nost QAM.  singulair 10mg   qd - has had some diarrhea since starting - 2/day.            Patient Active Problem List   Diagnosis Date Noted  . Headache 05/17/2018  . Vitamin B12 deficiency 03/16/2017  . B12 deficiency 12/16/2016  . Dizziness 12/15/2016  . Nonintractable headache 12/15/2016  . Bladder outlet obstruction 04/25/2015  . Erectile dysfunction due to arterial insufficiency 04/25/2015  . Osteoporosis 04/25/2015  . Vitamin D deficiency 04/25/2015  . Heme positive stool 03/16/2014  . Abdominal pain, epigastric 03/16/2014  . Prostate cancer ([place]) 05/30/2011  . Dyslipidemia 05/30/2011  . Hypogonadism male 05/30/2011  . Acute sinusitis 01/07/2007  . RHINITIS 08/25/2006   Past Medical History:  Diagnosis Date  . Adenomatous colon polyp 03/2001  . Allergy    SEASONAL  . Cancer ([place])   . Cataract   . Dizziness   . GERD (gastroesophageal reflux disease)   . Hearing loss   . Hyperlipidemia   . Low testosterone    Past Surgical History:  Procedure Laterality Date  . APPENDECTOMY    . COLONOSCOPY    . EYE SURGERY    . POLYPECTOMY    . PROSTATE SURGERY    . SPINE SURGERY     No Known Allergies Prior to Admission medications   Medication Sig Start Date End Date Taking? Authorizing Provider  acetaminophen (TYLENOL) 325 MG tablet Take 650 mg by mouth every 6 (six) hours as needed.   Yes [provider]  alfuzosin (UROXATRAL) 10 MG 24 hr tablet  11/09/14  Yes [provider]  aspirin 81 MG tablet Take 81 mg by mouth daily.   Yes [provider]  azithromycin (ZITHROMAX) 250 MG tablet Take 2 on first day. Then take 1 daily. Finish entire supply. 07/13/19  Yes Maximiano Coss, NP  benzonatate (TESSALON) 100 MG capsule Take 1 capsule (100 mg total) by mouth every 8 (eight) hours. 07/09/19  Yes Hall-Potvin, [place], PA-C  cetirizine (ZYRTEC ALLERGY) 10 MG tablet Take 1 tablet (10 mg total) by mouth daily. 07/09/19  Yes Hall-Potvin, [place], PA-C  Cholecalciferol (VITAMIN  D) 2000 UNITS tablet Take 2,000 Units by mouth daily.   Yes [provider]  fluticasone (FLONASE) 50 MCG/ACT nasal spray Place 1 spray into both nostrils daily. 07/09/19  Yes Hall-Potvin, [place], PA-C  guaiFENesin-dextromethorphan (ROBITUSSIN DM) 100-10 MG/5ML syrup Take 5 mLs by mouth every 4 (four) hours as needed for cough. 07/13/19  Yes Maximiano Coss, NP  montelukast (SINGULAIR) 10 MG tablet Take 1 tablet (10 mg total) by mouth at bedtime. 07/13/19  Yes Maximiano Coss, NP  Multiple Vitamin (MULTIVITAMIN) tablet Take 1 tablet by mouth daily.   Yes [provider]  omeprazole (PRILOSEC) 20 MG capsule Take 20 mg by mouth daily.   Yes [provider]  pantoprazole (PROTONIX) 40 MG tablet TAKE 1 TABLET BY MOUTH EVERY DAY 06/17/19  Yes [name], MD  RESTASIS 0.05 % ophthalmic emulsion INSTILL 1 DROP INTO BOTH EYES TWICE A DAY 07/13/17  Yes [provider]  simvastatin (ZOCOR) 20 MG tablet TAKE 1 TABLET DAILY AT 6PM. 03/09/19  Yes [name], MD  Testosterone 30 MG/ACT SOLN Place 30 g onto the skin.   Yes [provider]   Social History   Socioeconomic History  . Marital status: Married    Spouse name: Not on file  . Number of children: 3  . Years of education: 2 years [employer]  . Highest education level: Not on file  Occupational History  . Occupation: RETIRED  Tobacco Use  . Smoking status: Never Smoker  . Smokeless tobacco: Never Used  Substance and Sexual Activity  . Alcohol use: No    Alcohol/week: 0.0 standard drinks  . Drug use: No  . Sexual activity: Yes  Other Topics Concern  . Not on file  Social History Narrative   Married. Education: [employer]. Exercise: Yes.   Right-handed.   Drinks coffee daily.   Lives at home with his wife.   Social Determinants of Health   Financial Resource Strain:   . Difficulty of Paying Living Expenses:   Food Insecurity:   . Worried About Charity fundraiser in the Last Year:   . Arts development officer in the Last Year:   Transportation Needs:   . Film/video editor (Medical):   Marland Kitchen Lack of Transportation (Non-Medical):   Physical Activity:   . Days of Exercise per Week:   . Minutes of Exercise per Session:   Stress:   . Feeling of Stress :   Social Connections:   . Frequency of Communication with Friends and Family:   . Frequency of Social Gatherings with Friends and Family:   . Attends Religious Services:   . Active Member of Clubs or Organizations:   . Attends Archivist Meetings:   Marland Kitchen Marital Status:   Intimate Partner Violence:   . Fear of Current or Ex-Partner:   . Emotionally Abused:   Marland Kitchen Physically Abused:   . Sexually Abused:  Observations/Objective: Vitals:   07/22/19 1246  Temp: 98 F (36.7 C)  TempSrc: Temporal  Weight: 201 lb (91.2 kg)  Height: 5\' 11"  (1.803 m)   No distress. Speaking in full sentences,.  No respiratory distress, few coughs during phone call.  All questions were answered with understanding of plan expressed.  Assessment and Plan: PND (post-nasal drip) - Plan: cetirizine (ZYRTEC ALLERGY) 10 MG tablet  Cough - Plan: benzonatate (TESSALON) 100 MG capsule  Nasal congestion - Plan: cetirizine (ZYRTEC ALLERGY) 10 MG tablet, ipratropium (ATROVENT) 0.06 % nasal spray  Persistent nasal congestion, postnasal drip, sore throat/dry scratchy throat and cough.  Slight chest congestion but no fever no dyspnea and not dyspneic with walking on treadmill.  Possible allergic versus viral URI.  Postnasal drip likely contributing to scratchy throat andCough.  -Stop Singulair as caused diarrhea, RTC precautions if diarrhea persistent.   Continue Flonase, option of saline nasal spray if needed for nasal congestion, but will also try Atrovent nasal spray to try to dry secretions.  Potential side effects discussed  Restart cetirizine, Tessalon Perles  -Recheck in office next week if not improving, sooner if worse.  ER precautions  given.  Follow Up Instructions: Next week if not improving, sooner or ER if worse.   I discussed the assessment and treatment plan with the patient. The patient was provided an opportunity to ask questions and all were answered. The patient agreed with the plan and demonstrated an understanding of the instructions.   The patient was advised to call back or seek an in-person evaluation if the symptoms worsen or if the condition fails to improve as anticipated.  I provided 25 minutes of non-face-to-face time during this encounter.   [name], MD

## 2019-07-22 NOTE — Patient Instructions (Addendum)
  Ok to stop singulair. If diarrhea continues, call back for appointment to look at other causes.  Continue drink plenty fluids, okay to still use Flonase if you would like for allergies, saline nasal spray during the day of needed for congestion, but I am also sending in some different nasal spray that can be used up to 4 times per day for some of the nasal congestion which may be draining down the back of the throat. Can try tessalon perles again for cough, restart cetirizine.    If you have lab work done today you will be contacted with your lab results within the next 2 weeks.  If you have not heard from [place] then please contact us. The fastest way to get your results is to register for My Chart.   IF you received an x-ray today, you will receive an invoice from [facility]. Please contact [facility] at [phone] with questions or concerns regarding your invoice.   IF you received labwork today, you will receive an invoice from [place]. Please contact [facility] at [phone] with questions or concerns regarding your invoice.   Our billing staff will not be able to assist you with questions regarding bills from these companies.  You will be contacted with the lab results as soon as they are available. The fastest way to get your results is to activate your My Chart account. Instructions are located on the last page of this paperwork. If you have not heard from [place] regarding the results in 2 weeks, please contact this office.

## 2019-07-24 ENCOUNTER — Other Ambulatory Visit: Payer: Self-pay | Admitting: Gastroenterology

## 2019-08-04 ENCOUNTER — Other Ambulatory Visit: Payer: Self-pay | Admitting: Family Medicine

## 2019-08-04 DIAGNOSIS — R05 Cough: Secondary | ICD-10-CM

## 2019-08-04 DIAGNOSIS — R059 Cough, unspecified: Secondary | ICD-10-CM

## 2019-08-04 NOTE — Telephone Encounter (Signed)
Patient was seen by Er on 07/22/19 and seen Rich on 07/13/19 and seen by you on video on 07/22/19 was given Tessalon. Patient is now wanting a refill is this ok to refill or do we need to get him back in for a f/u

## 2019-08-04 NOTE — Telephone Encounter (Signed)
Temporarily refilled Tessalon but if still having cough, schedule follow-up visit within the next 1 week.

## 2019-08-15 NOTE — Progress Notes (Signed)
Telemedicine Encounter- SOAP NOTE Established Patient  This telephone encounter was conducted with the patient's (or proxy's) verbal consent via audio telecommunications: yes   Patient was instructed to have this encounter in a suitably private space; and to only have persons present to whom they give permission to participate. In addition, patient identity was confirmed by use of name plus two identifiers (DOB and address).  I discussed the limitations, risks, security and privacy concerns of performing an evaluation and management service by telephone and the availability of in person appointments. I also discussed with the patient that there may be a patient responsible charge related to this service. The patient expressed understanding and agreed to proceed.  I spent a total of 15 minutes talking with the patient or their proxy.  Chief Complaint  Patient presents with  . Sore Throat    Patient states since last week he has been having some Chest pain , sore throat , shortness of breath, and a dry cough. Per patient he took a covid test but it was negative and is taking  OTC medications thats only making it worse.    Subjective   Douglas [name] is a 84 y.o. established patient. Telephone visit today for sore throat, cough, chest pain  HPI Onset around a week ago, worsening. Cough is dry, not productive. No mucus. No shob Chest pain: feels like this is from coughing, only present when coughing, feels muscular in nature. He is not concerned for a cardiac etiology to this pain and denies angina, palpitations, headaches, visual changes, lightheadedness, dizziness. Throat is sore from coughing  No nasal or sinus congestion   Hx of seasonal allergies  COVID test negative, no known exposure, no sick contacts.   Patient Active Problem List   Diagnosis Date Noted  . Headache 05/17/2018  . Vitamin B12 deficiency 03/16/2017  . B12 deficiency 12/16/2016  . Dizziness 12/15/2016  .  Nonintractable headache 12/15/2016  . Bladder outlet obstruction 04/25/2015  . Erectile dysfunction due to arterial insufficiency 04/25/2015  . Osteoporosis 04/25/2015  . Vitamin D deficiency 04/25/2015  . Heme positive stool 03/16/2014  . Abdominal pain, epigastric 03/16/2014  . Prostate cancer ([place]) 05/30/2011  . Dyslipidemia 05/30/2011  . Hypogonadism male 05/30/2011  . Acute sinusitis 01/07/2007  . RHINITIS 08/25/2006    Past Medical History:  Diagnosis Date  . Adenomatous colon polyp 03/2001  . Allergy    SEASONAL  . Cancer ([place])   . Cataract   . Dizziness   . GERD (gastroesophageal reflux disease)   . Hearing loss   . Hyperlipidemia   . Low testosterone     Current Outpatient Medications  Medication Sig Dispense Refill  . acetaminophen (TYLENOL) 325 MG tablet Take 650 mg by mouth every 6 (six) hours as needed.    Marland Kitchen alfuzosin (UROXATRAL) 10 MG 24 hr tablet     . aspirin 81 MG tablet Take 81 mg by mouth daily.    . Cholecalciferol (VITAMIN D) 2000 UNITS tablet Take 2,000 Units by mouth daily.    . fluticasone (FLONASE) 50 MCG/ACT nasal spray Place 1 spray into both nostrils daily. 16 g 0  . Multiple Vitamin (MULTIVITAMIN) tablet Take 1 tablet by mouth daily.    Marland Kitchen omeprazole (PRILOSEC) 20 MG capsule Take 20 mg by mouth daily.    . RESTASIS 0.05 % ophthalmic emulsion INSTILL 1 DROP INTO BOTH EYES TWICE A DAY  3  . simvastatin (ZOCOR) 20 MG tablet TAKE 1 TABLET DAILY  AT 6PM. 90 tablet 1  . Testosterone 30 MG/ACT SOLN Place 30 g onto the skin.    Marland Kitchen azithromycin (ZITHROMAX) 250 MG tablet Take 2 on first day. Then take 1 daily. Finish entire supply. 6 tablet 0  . benzonatate (TESSALON) 100 MG capsule TAKE 1 CAPSULE (100 MG TOTAL) BY MOUTH EVERY 8 (EIGHT) HOURS. 21 capsule 0  . cetirizine (ZYRTEC ALLERGY) 10 MG tablet Take 1 tablet (10 mg total) by mouth daily. 30 tablet 0  . guaiFENesin-dextromethorphan (ROBITUSSIN DM) 100-10 MG/5ML syrup Take 5 mLs by mouth every 4 (four)  hours as needed for cough. 118 mL 0  . ipratropium (ATROVENT) 0.06 % nasal spray Place 1-2 sprays into both nostrils 4 (four) times daily. As needed for nasal congestion 15 mL 5  . pantoprazole (PROTONIX) 40 MG tablet TAKE 1 TABLET BY MOUTH EVERY DAY 30 tablet 0   No current facility-administered medications for this visit.    No Known Allergies  Social History   Socioeconomic History  . Marital status: Married    Spouse name: Not on file  . Number of children: 3  . Years of education: 2 years [employer]  . Highest education level: Not on file  Occupational History  . Occupation: RETIRED  Tobacco Use  . Smoking status: Never Smoker  . Smokeless tobacco: Never Used  Substance and Sexual Activity  . Alcohol use: No    Alcohol/week: 0.0 standard drinks  . Drug use: No  . Sexual activity: Yes  Other Topics Concern  . Not on file  Social History Narrative   Married. Education: [employer]. Exercise: Yes.   Right-handed.   Drinks coffee daily.   Lives at home with his wife.   Social Determinants of Health   Financial Resource Strain:   . Difficulty of Paying Living Expenses:   Food Insecurity:   . Worried About Charity fundraiser in the Last Year:   . Arboriculturist in the Last Year:   Transportation Needs:   . Film/video editor (Medical):   Marland Kitchen Lack of Transportation (Non-Medical):   Physical Activity:   . Days of Exercise per Week:   . Minutes of Exercise per Session:   Stress:   . Feeling of Stress :   Social Connections:   . Frequency of Communication with Friends and Family:   . Frequency of Social Gatherings with Friends and Family:   . Attends Religious Services:   . Active Member of Clubs or Organizations:   . Attends Archivist Meetings:   Marland Kitchen Marital Status:   Intimate Partner Violence:   . Fear of Current or Ex-Partner:   . Emotionally Abused:   Marland Kitchen Physically Abused:   . Sexually Abused:     ROS Per hpi, otherwise negative on 13pt ros  Objective    Vitals as reported by the patient: There were no vitals filed for this visit.  [name] was seen today for sore throat.  Diagnoses and all orders for this visit:  Chest congestion -     azithromycin (ZITHROMAX) 250 MG tablet; Take 2 on first day. Then take 1 daily. Finish entire supply. -     Discontinue: montelukast (SINGULAIR) 10 MG tablet; Take 1 tablet (10 mg total) by mouth at bedtime. -     guaiFENesin-dextromethorphan (ROBITUSSIN DM) 100-10 MG/5ML syrup; Take 5 mLs by mouth every 4 (four) hours as needed for cough.   PLAN  Concern for bacterial infection in chest - will give z  pack  Guaifenesin-dextromethorphan prn for cough  Montelukast nightly for allergies component  Return precautions given, hospital precautions given. Pt demonstrates understanding  Patient encouraged to call clinic with any questions, comments, or concerns.  I discussed the assessment and treatment plan with the patient. The patient was provided an opportunity to ask questions and all were answered. The patient agreed with the plan and demonstrated an understanding of the instructions.   The patient was advised to call back or seek an in-person evaluation if the symptoms worsen or if the condition fails to improve as anticipated.  I provided 15 minutes of non-face-to-face time during this encounter.  Maximiano Coss, NP  Primary Care at [facility]

## 2019-08-17 ENCOUNTER — Other Ambulatory Visit: Payer: Self-pay | Admitting: Gastroenterology

## 2019-08-27 ENCOUNTER — Other Ambulatory Visit: Payer: Self-pay | Admitting: Family Medicine

## 2019-08-27 DIAGNOSIS — R0981 Nasal congestion: Secondary | ICD-10-CM

## 2019-08-27 DIAGNOSIS — R0982 Postnasal drip: Secondary | ICD-10-CM

## 2019-08-27 NOTE — Telephone Encounter (Signed)
Requested Prescriptions  Pending Prescriptions Disp Refills  . cetirizine (ZYRTEC) 10 MG tablet [Pharmacy Med Name: CETIRIZINE HCL 10 MG TABLET] 30 tablet 0    Sig: TAKE 1 TABLET BY MOUTH EVERY DAY     Ear, Nose, and Throat:  Antihistamines Passed - 08/27/2019  8:33 AM      Passed - Valid encounter within last 12 months    Recent Outpatient Visits          1 month ago PND (post-nasal drip)   [facility], Ranell Patrick, MD   1 month ago Chest congestion   [facility] [place], NP   5 months ago Diabetes mellitus type 2, diet-controlled [facility])   [facility], Ranell Patrick, MD   7 months ago Medicare annual wellness visit, subsequent   [facility] at [place], MD   9 months ago Need for prophylactic vaccination and inoculation against influenza   [facility], Lilia Argue, MD      Future Appointments            In 1 week Carlota Raspberry Ranell Patrick, MD Primary [facility] at [place], [facility]

## 2019-09-07 ENCOUNTER — Ambulatory Visit (INDEPENDENT_AMBULATORY_CARE_PROVIDER_SITE_OTHER): Payer: Medicare Other | Admitting: Family Medicine

## 2019-09-07 ENCOUNTER — Other Ambulatory Visit: Payer: Self-pay

## 2019-09-07 ENCOUNTER — Encounter: Payer: Self-pay | Admitting: Family Medicine

## 2019-09-07 VITALS — BP 139/75 | HR 70 | Temp 98.4°F | Ht 71.0 in | Wt 203.0 lb

## 2019-09-07 DIAGNOSIS — K219 Gastro-esophageal reflux disease without esophagitis: Secondary | ICD-10-CM

## 2019-09-07 DIAGNOSIS — E785 Hyperlipidemia, unspecified: Secondary | ICD-10-CM | POA: Diagnosis not present

## 2019-09-07 DIAGNOSIS — E538 Deficiency of other specified B group vitamins: Secondary | ICD-10-CM

## 2019-09-07 DIAGNOSIS — M19012 Primary osteoarthritis, left shoulder: Secondary | ICD-10-CM

## 2019-09-07 DIAGNOSIS — E119 Type 2 diabetes mellitus without complications: Secondary | ICD-10-CM

## 2019-09-07 DIAGNOSIS — R9431 Abnormal electrocardiogram [ECG] [EKG]: Secondary | ICD-10-CM | POA: Diagnosis not present

## 2019-09-07 DIAGNOSIS — R0982 Postnasal drip: Secondary | ICD-10-CM | POA: Diagnosis not present

## 2019-09-07 DIAGNOSIS — M25512 Pain in left shoulder: Secondary | ICD-10-CM | POA: Diagnosis not present

## 2019-09-07 DIAGNOSIS — R0981 Nasal congestion: Secondary | ICD-10-CM

## 2019-09-07 DIAGNOSIS — R0789 Other chest pain: Secondary | ICD-10-CM | POA: Diagnosis not present

## 2019-09-07 MED ORDER — FLUTICASONE PROPIONATE 50 MCG/ACT NA SUSP: 1.0000 | Freq: Every day | NASAL | 5 refills | Status: DC

## 2019-09-07 MED ORDER — SIMVASTATIN 20 MG PO TABS: ORAL_TABLET | ORAL | 1 refills | Status: DC

## 2019-09-07 MED ORDER — PANTOPRAZOLE SODIUM 40 MG PO TBEC: 40.0000 mg | DELAYED_RELEASE_TABLET | Freq: Every day | ORAL | 1 refills | Status: DC

## 2019-09-07 MED ORDER — CETIRIZINE HCL 10 MG PO TABS: 10.0000 mg | ORAL_TABLET | Freq: Every day | ORAL | 11 refills | Status: DC

## 2019-09-07 NOTE — Progress Notes (Signed)
Subjective:  Patient ID: Douglas [name], male    DOB: 06-Jul-1935  Age: 84 y.o. MRN: [id number]  CC:  Chief Complaint  Patient presents with  . Follow-up    on Diabetes,hyperlipidemia,B12-defincancy, and L shoulder pain. pt reports no issue with his Diabetes since last OV. pt dosn't check his BS at all. pt reports no physical symptomsa of his hyperlipidemia other than some chest tightness. pt reports still having pain in his L shoulder and ikt seems to have moved into the pt's back per pt.    HPI Douglas [name] presents for   Diabetes:  Diet controlled. He is on statin.  Wt Readings from Last 3 Encounters:  09/07/19 203 lb (92.1 kg)  07/22/19 201 lb (91.2 kg)  03/09/19 204 lb 3.2 oz (92.6 kg)  walking for exercise.  Most days.  Microalbumin: Normal ratio 03/09/2019 Optho, foot exam, pneumovax: Ophthalmology- appt soon.    Lab Results  Component Value Date   HGBA1C 6.4 (H) 03/09/2019   HGBA1C 6.5 (H) 08/18/2018   HGBA1C 6.5 (H) 02/11/2018   Lab Results  Component Value Date   MICROALBUR 0.6 04/30/2015   LDLCALC 67 03/09/2019   CREATININE 1.21 03/09/2019   Hyperlipidemia: Simvastatin 20 mg daily. No new myalgias or side effects.  Lab Results  Component Value Date   CHOL 125 03/09/2019   HDL 41 03/09/2019   LDLCALC 67 03/09/2019   TRIG 88 03/09/2019   CHOLHDL 3.0 03/09/2019   Lab Results  Component Value Date   ALT 32 03/09/2019   AST 20 03/09/2019   ALKPHOS 59 03/09/2019   BILITOT 0.4 03/09/2019    Left shoulder pain Discussed in December.  3 to 4 months at that time.  Aspirin, Tylenol use at times.  Thought to have component of impingement syndrome, similar symptoms in the past.  Subacromial injection given.  X-ray March 09, 2019 with mild AC arthritis and subacromial spur.  Normal-appearing glenohumeral joint. Only temporarily relief with prior injection - 1 month, then restarted. Constant soreness.  Tx: rare tylenol.  No neck or back radiation.   B12  deficiency Previously on supplementation.  Level greater than 2000 6 months ago. Still on supplement.   Allergic rhinitis: Using zyrtec and flonase with mod relief   GERD: protonix once per day. No recent heartburn.   Chest tightness: Few episodes of chest tightness. Left side of chest. Few weeks ago. At rest. Felt like needed to belch. Not recently eaten. no CP with exercise. No CP or dyspnea with exertion. No cough.    History Patient Active Problem List   Diagnosis Date Noted  . Headache 05/17/2018  . Vitamin B12 deficiency 03/16/2017  . B12 deficiency 12/16/2016  . Dizziness 12/15/2016  . Nonintractable headache 12/15/2016  . Bladder outlet obstruction 04/25/2015  . Erectile dysfunction due to arterial insufficiency 04/25/2015  . Osteoporosis 04/25/2015  . Vitamin D deficiency 04/25/2015  . Heme positive stool 03/16/2014  . Abdominal pain, epigastric 03/16/2014  . Prostate cancer ([place]) 05/30/2011  . Dyslipidemia 05/30/2011  . Hypogonadism male 05/30/2011  . Acute sinusitis 01/07/2007  . RHINITIS 08/25/2006   Past Medical History:  Diagnosis Date  . Adenomatous colon polyp 03/2001  . Allergy    SEASONAL  . Cancer ([place])   . Cataract   . Dizziness   . GERD (gastroesophageal reflux disease)   . Hearing loss   . Hyperlipidemia   . Low testosterone    Past Surgical History:  Procedure Laterality Date  .  APPENDECTOMY    . COLONOSCOPY    . EYE SURGERY    . POLYPECTOMY    . PROSTATE SURGERY    . SPINE SURGERY     No Known Allergies Prior to Admission medications   Medication Sig Start Date End Date Taking? Authorizing Provider  acetaminophen (TYLENOL) 325 MG tablet Take 650 mg by mouth every 6 (six) hours as needed.   Yes [provider]  alfuzosin (UROXATRAL) 10 MG 24 hr tablet  11/09/14  Yes [provider]  aspirin 81 MG tablet Take 81 mg by mouth daily.   Yes [provider]  cetirizine (ZYRTEC) 10 MG tablet TAKE 1 TABLET BY MOUTH  EVERY DAY 08/27/19  Yes [name], MD  Cholecalciferol (VITAMIN D) 2000 UNITS tablet Take 2,000 Units by mouth daily.   Yes [provider]  fluticasone (FLONASE) 50 MCG/ACT nasal spray Place 1 spray into both nostrils daily. 07/09/19  Yes Hall-Potvin, [place], PA-C  latanoprost (XALATAN) 0.005 % ophthalmic solution Place 1 drop into the left eye at bedtime. 07/14/19  Yes [provider]  Multiple Vitamin (MULTIVITAMIN) tablet Take 1 tablet by mouth daily.   Yes [provider]  omeprazole (PRILOSEC) 20 MG capsule Take 20 mg by mouth daily.   Yes [provider]  pantoprazole (PROTONIX) 40 MG tablet TAKE 1 TABLET BY MOUTH EVERY DAY 07/25/19  Yes [name], MD  RESTASIS 0.05 % ophthalmic emulsion INSTILL 1 DROP INTO BOTH EYES TWICE A DAY 07/13/17  Yes [provider]  simvastatin (ZOCOR) 20 MG tablet TAKE 1 TABLET DAILY AT 6PM. 03/09/19  Yes [name], MD  Testosterone 30 MG/ACT SOLN Place 30 g onto the skin.   Yes [provider]  azithromycin (ZITHROMAX) 250 MG tablet Take 2 on first day. Then take 1 daily. Finish entire supply. 07/13/19   Maximiano Coss, NP  benzonatate (TESSALON) 100 MG capsule TAKE 1 CAPSULE (100 MG TOTAL) BY MOUTH EVERY 8 (EIGHT) HOURS. 08/04/19   [name], MD  guaiFENesin-dextromethorphan (ROBITUSSIN DM) 100-10 MG/5ML syrup Take 5 mLs by mouth every 4 (four) hours as needed for cough. 07/13/19   Maximiano Coss, NP  ipratropium (ATROVENT) 0.06 % nasal spray Place 1-2 sprays into both nostrils 4 (four) times daily. As needed for nasal congestion 07/22/19   [name], MD   Social History   Socioeconomic History  . Marital status: Married    Spouse name: Not on file  . Number of children: 3  . Years of education: 2 years [employer]  . Highest education level: Not on file  Occupational History  . Occupation: RETIRED  Tobacco Use  . Smoking status: Never Smoker  . Smokeless tobacco: Never Used    Substance and Sexual Activity  . Alcohol use: No    Alcohol/week: 0.0 standard drinks  . Drug use: No  . Sexual activity: Yes  Other Topics Concern  . Not on file  Social History Narrative   Married. Education: [employer]. Exercise: Yes.   Right-handed.   Drinks coffee daily.   Lives at home with his wife.   Social Determinants of Health   Financial Resource Strain:   . Difficulty of Paying Living Expenses:   Food Insecurity:   . Worried About Charity fundraiser in the Last Year:   . Arboriculturist in the Last Year:   Transportation Needs:   . Film/video editor (Medical):   Marland Kitchen Lack of Transportation (Non-Medical):  Physical Activity:   . Days of Exercise per Week:   . Minutes of Exercise per Session:   Stress:   . Feeling of Stress :   Social Connections:   . Frequency of Communication with Friends and Family:   . Frequency of Social Gatherings with Friends and Family:   . Attends Religious Services:   . Active Member of Clubs or Organizations:   . Attends Archivist Meetings:   Marland Kitchen Marital Status:   Intimate Partner Violence:   . Fear of Current or Ex-Partner:   . Emotionally Abused:   Marland Kitchen Physically Abused:   . Sexually Abused:     Review of Systems  Constitutional: Negative for fatigue and unexpected weight change.  Eyes: Negative for visual disturbance.  Respiratory: Negative for cough, chest tightness and shortness of breath.   Cardiovascular: Positive for chest pain (tightness as above. ). Negative for palpitations and leg swelling.  Gastrointestinal: Negative for abdominal pain and blood in stool.  Neurological: Negative for dizziness, light-headedness and headaches.   Other per HPI.   Objective:   Vitals:   09/07/19 0842 09/07/19 0857  BP: (!) 148/71 139/75  Pulse: 70   Temp: 98.4 F (36.9 C)   TempSrc: Temporal   SpO2: 97%   Weight: 203 lb (92.1 kg)   Height: 5\' 11"  (1.803 m)      Physical Exam Vitals reviewed.  Constitutional:       Appearance: He is well-developed.  [name]:     Head: Normocephalic and atraumatic.  Eyes:     Pupils: Pupils are equal, round, and reactive to light.  Neck:     Vascular: No carotid bruit or JVD.  Cardiovascular:     Rate and Rhythm: Normal rate and regular rhythm.     Heart sounds: Normal heart sounds. No murmur.  Pulmonary:     Effort: Pulmonary effort is normal.     Breath sounds: Normal breath sounds. No rales.  Abdominal:     General: Abdomen is flat.     Tenderness: There is no abdominal tenderness.  Musculoskeletal:     Comments: C-spine pain-free range of motion, does not cause pain to shoulder Left shoulder full range of motion, full rotator cuff strength.  Does have tenderness palpation of the left AC.  Some discomfort at terminal flexion, pain with crossover, pain AC with [place], Neer.  Negative empty can.  Skin:    General: Skin is warm and dry.  Neurological:     Mental Status: He is alert and oriented to person, place, and time.   ekg: Sinus rhythm, rate 65.  First-degree AV block.  Possible old anteroseptal infarct indicated on reading, not noted on 03/08/2014 EKG, no other apparent changes.  Over 40 min total time with repeat exam, history. Interpretation of date - greater than 50% counseling.   Assessment & Plan:  ALBIN DUCKETT is a 84 y.o. male . Feeling of chest tightness - Plan: EKG 12-Lead, Ambulatory referral to Cardiology Nonspecific abnormal electrocardiogram (ECG) (EKG) - Plan: Ambulatory referral to Cardiology  - atypical as pain at rest. Does have cardiac risk factors with DM, Age, HLD. No acute findings on ekg, but some possible changes from prior. Asymptomatic at present. Refer to cardiology with ER/911 chest pain precautions reviewed.    Diabetes mellitus type 2, diet-controlled ([place]) - Plan: Hemoglobin A1c  - check A1c, continue diet control.   Hyperlipidemia, unspecified hyperlipidemia type - Plan: Comprehensive metabolic panel, Lipid panel,  simvastatin (ZOCOR) 20  MG tablet  - The current medical regimen is effective;  continue present plan and medications.  B12 deficiency  - continue otc supplement.   Left shoulder pain, unspecified chronicity - Plan: Ambulatory referral to Orthopedic Surgery Arthritis of left acromioclavicular joint - Plan: Ambulatory referral to Orthopedic Surgery  - primarily [facility] arthritis likely issue with some improvement prior with injection - may have component of impingement as well - refer to ortho. Tylenol if needed for pain for now.   PND (post-nasal drip) - Plan: cetirizine (ZYRTEC) 10 MG tablet, fluticasone (FLONASE) 50 MCG/ACT nasal spray Nasal congestion - Plan: cetirizine (ZYRTEC) 10 MG tablet, fluticasone (FLONASE) 50 MCG/ACT nasal spray  - continue same,  rtc precautions if less effective.   Gastroesophageal reflux disease, unspecified whether esophagitis present - Plan: pantoprazole (PROTONIX) 40 MG tablet  - stable with protonix.   Meds ordered this encounter  Medications  . cetirizine (ZYRTEC) 10 MG tablet    Sig: Take 1 tablet (10 mg total) by mouth daily.    Dispense:  30 tablet    Refill:  11  . pantoprazole (PROTONIX) 40 MG tablet    Sig: Take 1 tablet (40 mg total) by mouth daily.    Dispense:  90 tablet    Refill:  1  . simvastatin (ZOCOR) 20 MG tablet    Sig: TAKE 1 TABLET DAILY AT 6PM.    Dispense:  90 tablet    Refill:  1  . fluticasone (FLONASE) 50 MCG/ACT nasal spray    Sig: Place 1 spray into both nostrils daily.    Dispense:  16 g    Refill:  5   Patient Instructions    I will refer you to orthopedics to evaluate the shoulder pain which appears to be at the collarbone joint.  Tylenol over-the-counter for now is fine.  I will also check lab work from today and let she know if there are any concerns.  No medication changes today.  EKG or heart rhythm looked possibly a little different than 2015.  I would like you to meet with cardiology to review that EKG as well  as discussed the chest tightness that she were feeling previously.  If any return of chest pain or acute worsening symptoms be seen in the emergency room or call 911.   Nonspecific Chest Pain, Adult Chest pain can be caused by many different conditions. It can be caused by a condition that is life-threatening and requires treatment right away. It can also be caused by something that is not life-threatening. If you have chest pain, it can be hard to know the difference, so it is important to get help right away to make sure that you do not have a serious condition. Some life-threatening causes of chest pain include:  Heart attack.  A tear in the body's main blood vessel (aortic dissection).  Inflammation around your heart (pericarditis).  A problem in the lungs, such as a blood clot (pulmonary embolism) or a collapsed lung (pneumothorax). Some non life-threatening causes of chest pain include:  Heartburn.  Anxiety or stress.  Damage to the bones, muscles, and cartilage that make up your chest wall.  Pneumonia or bronchitis.  Shingles infection (varicella-zoster virus). Chest pain can feel like:  Pain or discomfort on the surface of your chest or deep in your chest.  Crushing, pressure, aching, or squeezing pain.  Burning or tingling.  Dull or sharp pain that is worse when you move, cough, or take a deep breath.  Pain or discomfort that is also felt in your back, neck, jaw, shoulder, or arm, or pain that spreads to any of these areas. Your chest pain may come and go. It may also be constant. Your health care provider will do lab tests and other studies to find the cause of your pain. Treatment will depend on the cause of your chest pain. Follow these instructions at home: Medicines  Take over-the-counter and prescription medicines only as told by your health care provider.  If you were prescribed an antibiotic, take it as told by your health care provider. Do not stop taking  the antibiotic even if you start to feel better. Lifestyle   Rest as directed by your health care provider.  Do not use any products that contain nicotine or tobacco, such as cigarettes and e-cigarettes. If you need help quitting, ask your health care provider.  Do not drink alcohol.  Make healthy lifestyle choices as recommended. These may include: ? Getting regular exercise. Ask your health care provider to suggest some activities that are safe for you. ? Eating a heart-healthy diet. This includes plenty of fresh fruits and vegetables, whole grains, low-fat (lean) protein, and low-fat dairy products. A dietitian can help you find healthy eating options. ? Maintaining a healthy weight. ? Managing any other health conditions you have, such as high blood pressure (hypertension) or diabetes. ? Reducing stress, such as with yoga or relaxation techniques. General instructions  Pay attention to any changes in your symptoms. Tell your health care provider about them or any new symptoms.  Avoid any activities that cause chest pain.  Keep all follow-up visits as told by your health care provider. This is important. This includes visits for any further testing if your chest pain does not go away. Contact a health care provider if:  Your chest pain does not go away.  You feel depressed.  You have a fever. Get help right away if:  Your chest pain gets worse.  You have a cough that gets worse, or you cough up blood.  You have severe pain in your abdomen.  You faint.  You have sudden, unexplained chest discomfort.  You have sudden, unexplained discomfort in your arms, back, neck, or jaw.  You have shortness of breath at any time.  You suddenly start to sweat, or your skin gets clammy.  You feel nausea or you vomit.  You suddenly feel lightheaded or dizzy.  You have severe weakness, or unexplained weakness or fatigue.  Your heart begins to beat quickly, or it feels like it is  skipping beats. These symptoms may represent a serious problem that is an emergency. Do not wait to see if the symptoms will go away. Get medical help right away. Call your local emergency services (911 in the [place] Do not drive yourself to the hospital. Summary  Chest pain can be caused by a condition that is serious and requires urgent treatment. It may also be caused by something that is not life-threatening.  If you have chest pain, it is very important to see your health care provider. Your health care provider may do lab tests and other studies to find the cause of your pain.  Follow your health care provider's instructions on taking medicines, making lifestyle changes, and getting emergency treatment if symptoms become worse.  Keep all follow-up visits as told by your health care provider. This includes visits for any further testing if your chest pain does not go away. This information is  not intended to replace advice given to you by your health care provider. Make sure you discuss any questions you have with your health care provider. Document Revised: 09/17/2017 Document Reviewed: 09/17/2017 Elsevier Patient Education  [employer].    If you have lab work done today you will be contacted with your lab results within the next 2 weeks.  If you have not heard from [place] then please contact us. The fastest way to get your results is to register for My Chart.   IF you received an x-ray today, you will receive an invoice from [facility]. Please contact [facility] at [phone] with questions or concerns regarding your invoice.   IF you received labwork today, you will receive an invoice from [place]. Please contact [facility] at [phone] with questions or concerns regarding your invoice.   Our billing staff will not be able to assist you with questions regarding bills from these companies.  You will be contacted with the lab results as soon as they are  available. The fastest way to get your results is to activate your My Chart account. Instructions are located on the last page of this paperwork. If you have not heard from [place] regarding the results in 2 weeks, please contact this office.         Signed, [name], MD [facility]

## 2019-09-07 NOTE — Patient Instructions (Addendum)
I will refer you to orthopedics to evaluate the shoulder pain which appears to be at the collarbone joint.  Tylenol over-the-counter for now is fine.  I will also check lab work from today and let she know if there are any concerns.  No medication changes today.  EKG or heart rhythm looked possibly a little different than 2015.  I would like you to meet with cardiology to review that EKG as well as discussed the chest tightness that she were feeling previously.  If any return of chest pain or acute worsening symptoms be seen in the emergency room or call 911.   Nonspecific Chest Pain, Adult Chest pain can be caused by many different conditions. It can be caused by a condition that is life-threatening and requires treatment right away. It can also be caused by something that is not life-threatening. If you have chest pain, it can be hard to know the difference, so it is important to get help right away to make sure that you do not have a serious condition. Some life-threatening causes of chest pain include:  Heart attack.  A tear in the body's main blood vessel (aortic dissection).  Inflammation around your heart (pericarditis).  A problem in the lungs, such as a blood clot (pulmonary embolism) or a collapsed lung (pneumothorax). Some non life-threatening causes of chest pain include:  Heartburn.  Anxiety or stress.  Damage to the bones, muscles, and cartilage that make up your chest wall.  Pneumonia or bronchitis.  Shingles infection (varicella-zoster virus). Chest pain can feel like:  Pain or discomfort on the surface of your chest or deep in your chest.  Crushing, pressure, aching, or squeezing pain.  Burning or tingling.  Dull or sharp pain that is worse when you move, cough, or take a deep breath.  Pain or discomfort that is also felt in your back, neck, jaw, shoulder, or arm, or pain that spreads to any of these areas. Your chest pain may come and go. It may also be  constant. Your health care provider will do lab tests and other studies to find the cause of your pain. Treatment will depend on the cause of your chest pain. Follow these instructions at home: Medicines  Take over-the-counter and prescription medicines only as told by your health care provider.  If you were prescribed an antibiotic, take it as told by your health care provider. Do not stop taking the antibiotic even if you start to feel better. Lifestyle   Rest as directed by your health care provider.  Do not use any products that contain nicotine or tobacco, such as cigarettes and e-cigarettes. If you need help quitting, ask your health care provider.  Do not drink alcohol.  Make healthy lifestyle choices as recommended. These may include: ? Getting regular exercise. Ask your health care provider to suggest some activities that are safe for you. ? Eating a heart-healthy diet. This includes plenty of fresh fruits and vegetables, whole grains, low-fat (lean) protein, and low-fat dairy products. A dietitian can help you find healthy eating options. ? Maintaining a healthy weight. ? Managing any other health conditions you have, such as high blood pressure (hypertension) or diabetes. ? Reducing stress, such as with yoga or relaxation techniques. General instructions  Pay attention to any changes in your symptoms. Tell your health care provider about them or any new symptoms.  Avoid any activities that cause chest pain.  Keep all follow-up visits as told by your health care provider. This  is important. This includes visits for any further testing if your chest pain does not go away. Contact a health care provider if:  Your chest pain does not go away.  You feel depressed.  You have a fever. Get help right away if:  Your chest pain gets worse.  You have a cough that gets worse, or you cough up blood.  You have severe pain in your abdomen.  You faint.  You have sudden,  unexplained chest discomfort.  You have sudden, unexplained discomfort in your arms, back, neck, or jaw.  You have shortness of breath at any time.  You suddenly start to sweat, or your skin gets clammy.  You feel nausea or you vomit.  You suddenly feel lightheaded or dizzy.  You have severe weakness, or unexplained weakness or fatigue.  Your heart begins to beat quickly, or it feels like it is skipping beats. These symptoms may represent a serious problem that is an emergency. Do not wait to see if the symptoms will go away. Get medical help right away. Call your local emergency services (911 in the [place] Do not drive yourself to the hospital. Summary  Chest pain can be caused by a condition that is serious and requires urgent treatment. It may also be caused by something that is not life-threatening.  If you have chest pain, it is very important to see your health care provider. Your health care provider may do lab tests and other studies to find the cause of your pain.  Follow your health care provider's instructions on taking medicines, making lifestyle changes, and getting emergency treatment if symptoms become worse.  Keep all follow-up visits as told by your health care provider. This includes visits for any further testing if your chest pain does not go away. This information is not intended to replace advice given to you by your health care provider. Make sure you discuss any questions you have with your health care provider. Document Revised: 09/17/2017 Document Reviewed: 09/17/2017 Elsevier Patient Education  [employer].    If you have lab work done today you will be contacted with your lab results within the next 2 weeks.  If you have not heard from [place] then please contact us. The fastest way to get your results is to register for My Chart.   IF you received an x-ray today, you will receive an invoice from [facility]. Please contact [facility]  at [phone] with questions or concerns regarding your invoice.   IF you received labwork today, you will receive an invoice from [place]. Please contact [facility] at [phone] with questions or concerns regarding your invoice.   Our billing staff will not be able to assist you with questions regarding bills from these companies.  You will be contacted with the lab results as soon as they are available. The fastest way to get your results is to activate your My Chart account. Instructions are located on the last page of this paperwork. If you have not heard from [place] regarding the results in 2 weeks, please contact this office.

## 2019-09-08 LAB — COMPREHENSIVE METABOLIC PANEL
ALT: 22 IU/L (ref 0–44)
AST: 16 IU/L (ref 0–40)
Albumin/Globulin Ratio: 1.3 (ref 1.2–2.2)
Albumin: 4.1 g/dL (ref 3.6–4.6)
Alkaline Phosphatase: 59 IU/L (ref 48–121)
BUN/Creatinine Ratio: 8 — ABNORMAL LOW (ref 10–24)
BUN: 9 mg/dL (ref 8–27)
Bilirubin Total: 0.5 mg/dL (ref 0.0–1.2)
CO2: 26 mmol/L (ref 20–29)
Calcium: 9.8 mg/dL (ref 8.6–10.2)
Chloride: 102 mmol/L (ref 96–106)
Creatinine, Ser: 1.15 mg/dL (ref 0.76–1.27)
GFR calc Af Amer: 68 mL/min/{1.73_m2} (ref >59)
GFR calc non Af Amer: 59 mL/min/{1.73_m2} — ABNORMAL LOW (ref >59)
Globulin, Total: 3.2 g/dL (ref 1.5–4.5)
Glucose: 140 mg/dL — ABNORMAL HIGH (ref 65–99)
Potassium: 4.5 mmol/L (ref 3.5–5.2)
Sodium: 141 mmol/L (ref 134–144)
Total Protein: 7.3 g/dL (ref 6.0–8.5)

## 2019-09-08 LAB — LIPID PANEL
Chol/HDL Ratio: 3.5 ratio (ref 0.0–5.0)
Cholesterol, Total: 122 mg/dL (ref 100–199)
HDL: 35 mg/dL — ABNORMAL LOW (ref >39)
LDL Chol Calc (NIH): 60 mg/dL (ref 0–99)
Triglycerides: 157 mg/dL — ABNORMAL HIGH (ref 0–149)
VLDL Cholesterol Cal: 27 mg/dL (ref 5–40)

## 2019-09-08 LAB — HEMOGLOBIN A1C
Est. average glucose Bld gHb Est-mCnc: 148 mg/dL
Hgb A1c MFr Bld: 6.8 % — ABNORMAL HIGH (ref 4.8–5.6)

## 2019-09-15 ENCOUNTER — Encounter: Payer: Self-pay | Admitting: Cardiovascular Disease

## 2019-09-15 ENCOUNTER — Other Ambulatory Visit: Payer: Self-pay

## 2019-09-15 ENCOUNTER — Ambulatory Visit (INDEPENDENT_AMBULATORY_CARE_PROVIDER_SITE_OTHER): Payer: Medicare Other | Admitting: Cardiovascular Disease

## 2019-09-15 DIAGNOSIS — E785 Hyperlipidemia, unspecified: Secondary | ICD-10-CM | POA: Diagnosis not present

## 2019-09-15 DIAGNOSIS — R0789 Other chest pain: Secondary | ICD-10-CM

## 2019-09-15 NOTE — Assessment & Plan Note (Signed)
Mr. Douglas [name] was referred to me by Dr. Carlota Raspberry for atypical chest pain.  His only cardiac risk factor is hyperlipidemia.  He is never had a heart attack or stroke.  There is no family history of heart disease.  He is never smoked.  He has fairly constant chest pain which began a year ago.  Nothing in particular brings it on.  He is fairly active and exercises on the treadmill for an hour a day.  I reassured him that in all likelihood this is not ischemically mediated.  I am going to get a routine GXT to further evaluate

## 2019-09-15 NOTE — Patient Instructions (Signed)
Medication Instructions:  Your Physician recommend you continue on your current medication as directed.    *If you need a refill on your cardiac medications before your next appointment, please call your pharmacy*   Lab Work: None   Testing/Procedures: Your physician has requested that you have an exercise tolerance test. For further information please visit [email or web address]. Please also follow instruction sheet, as given. [place]    Follow-Up: At [facility], you and your health needs are our priority.  As part of our continuing mission to provide you with exceptional heart care, we have created designated Provider Care Teams.  These Care Teams include your primary Cardiologist (physician) and Advanced Practice Providers (APPs -  Physician Assistants and Nurse Practitioners) who all work together to provide you with the care you need, when you need it.  We recommend signing up for the patient portal called "MyChart".  Sign up information is provided on this After Visit Summary.  MyChart is used to connect with patients for Virtual Visits (Telemedicine).  Patients are able to view lab/test results, encounter notes, upcoming appointments, etc.  Non-urgent messages can be sent to your provider as well.   To learn more about what you can do with MyChart, go to [email or web address].    Your next appointment:   As Needed  The format for your next appointment:   Either In Person or Virtual  Provider:   Quay Burow, MD     [facility] [address], [place], [place] [address] Phone:  [phone]        You are scheduled for an Exercise Stress Test  Please arrive 15 minutes prior to your appointment time for registration and insurance purposes.  The test will take approximately 45 minutes to complete.  How to prepare for your Exercise Stress Test:  Do bring a list of your current  medications with you.  If not listed below, you may take your medications as normal.  Do wear comfortable clothes (no dresses or overalls) and walking shoes, tennis shoes preferred (no heels or open toed shoes are allowed)  Do Not wear cologne, perfume, aftershave or lotions (deodorant is allowed).  Please report to [place] for your test.  If these instructions are not followed, your test will have to be rescheduled.  If you have questions or concerns about your appointment, you can call the Stress Lab at [phone].  If you cannot keep your appointment, please provide 24 hours notification to the Stress Lab, to avoid a possible $50 charge to your account

## 2019-09-15 NOTE — Progress Notes (Signed)
09/15/2019 Douglas [name]   03/29/1936  [id number]  Primary Physician Douglas Agreste, MD Primary Cardiologist: Lorretta Harp MD Douglas [name], [place]  HPI:  Douglas [name] is a 84 y.o. thin appearing married African-American male father of 76, grandfather of 15 great-grandchildren who is retired for 21 years after working at [employer] .  He was referred by Dr. Nyoka [name], his PCP, for evaluation of atypical chest pain.  His only risk factor for heart disease is treated hyperlipidemia.  He is never had a heart attack or stroke.  He is fairly active and works out on the treadmill for an hour a day as well as stretching on the floor during which time he never has exertional chest pain.  He developed constant chest pain approximately a year ago.  There is no relationship to activity or food intake or position.   Current Meds  Medication Sig  . acetaminophen (TYLENOL) 325 MG tablet Take 650 mg by mouth every 6 (six) hours as needed.  Marland Kitchen alfuzosin (UROXATRAL) 10 MG 24 hr tablet   . aspirin 81 MG tablet Take 81 mg by mouth daily.  . cetirizine (ZYRTEC) 10 MG tablet Take 1 tablet (10 mg total) by mouth daily.  . Cholecalciferol (VITAMIN D) 2000 UNITS tablet Take 2,000 Units by mouth daily.  . fluticasone (FLONASE) 50 MCG/ACT nasal spray Place 1 spray into both nostrils daily.  Marland Kitchen latanoprost (XALATAN) 0.005 % ophthalmic solution Place 1 drop into the left eye at bedtime.  . Multiple Vitamin (MULTIVITAMIN) tablet Take 1 tablet by mouth daily.  . pantoprazole (PROTONIX) 40 MG tablet Take 1 tablet (40 mg total) by mouth daily.  . RESTASIS 0.05 % ophthalmic emulsion INSTILL 1 DROP INTO BOTH EYES TWICE A DAY  . simvastatin (ZOCOR) 20 MG tablet TAKE 1 TABLET DAILY AT 6PM.  . Testosterone 30 MG/ACT SOLN Place 30 g onto the skin.     No Known Allergies  Social History   Socioeconomic History  . Marital status: Married    Spouse name: Not on file  . Number of children: 3  . Years of education: 2  years GT  . Highest education level: Not on file  Occupational History  . Occupation: RETIRED  Tobacco Use  . Smoking status: Never Smoker  . Smokeless tobacco: Never Used  Vaping Use  . Vaping Use: Never used  Substance and Sexual Activity  . Alcohol use: No    Alcohol/week: 0.0 standard drinks  . Drug use: No  . Sexual activity: Yes  Other Topics Concern  . Not on file  Social History Narrative   Married. Education: [employer]. Exercise: Yes.   Right-handed.   Drinks coffee daily.   Lives at home with his wife.   Social Determinants of Health   Financial Resource Strain:   . Difficulty of Paying Living Expenses:   Food Insecurity:   . Worried About Charity fundraiser in the Last Year:   . Arboriculturist in the Last Year:   Transportation Needs:   . Film/video editor (Medical):   Marland Kitchen Lack of Transportation (Non-Medical):   Physical Activity:   . Days of Exercise per Week:   . Minutes of Exercise per Session:   Stress:   . Feeling of Stress :   Social Connections:   . Frequency of Communication with Friends and Family:   . Frequency of Social Gatherings with Friends and Family:   . Attends Religious Services:   .  Active Member of Clubs or Organizations:   . Attends Archivist Meetings:   Marland Kitchen Marital Status:   Intimate Partner Violence:   . Fear of Current or Ex-Partner:   . Emotionally Abused:   Marland Kitchen Physically Abused:   . Sexually Abused:      Review of Systems: General: negative for chills, fever, night sweats or weight changes.  Cardiovascular: negative for chest pain, dyspnea on exertion, edema, orthopnea, palpitations, paroxysmal nocturnal dyspnea or shortness of breath Dermatological: negative for rash Respiratory: negative for cough or wheezing Urologic: negative for hematuria Abdominal: negative for nausea, vomiting, diarrhea, bright red blood per rectum, melena, or hematemesis Neurologic: negative for visual changes, syncope, or dizziness All  other systems reviewed and are otherwise negative except as noted above.    Blood pressure (!) 150/76, pulse 64, height 5' 11.5" (1.816 m), weight 202 lb 9.6 oz (91.9 kg), SpO2 98 %.  General appearance: alert and no distress Neck: no adenopathy, no carotid bruit, no JVD, supple, symmetrical, trachea midline and thyroid not enlarged, symmetric, no tenderness/mass/nodules Lungs: clear to auscultation bilaterally Heart: regular rate and rhythm, S1, S2 normal, no murmur, click, rub or gallop Extremities: extremities normal, atraumatic, no cyanosis or edema Pulses: 2+ and symmetric Skin: Skin color, texture, turgor normal. No rashes or lesions Neurologic: Alert and oriented X 3, normal strength and tone. Normal symmetric reflexes. Normal coordination and gait  EKG sinus rhythm at 64 with septal Q waves.  I personally reviewed this EKG.  ASSESSMENT AND PLAN:   Atypical chest pain Douglas [name] was referred to me by Douglas [name] for atypical chest pain.  His only cardiac risk factor is hyperlipidemia.  He is never had a heart attack or stroke.  There is no family history of heart disease.  He is never smoked.  He has fairly constant chest pain which began a year ago.  Nothing in particular brings it on.  He is fairly active and exercises on the treadmill for an hour a day.  I reassured him that in all likelihood this is not ischemically mediated.  I am going to get a routine GXT to further evaluate  Dyslipidemia History of hyperlipidemia on simvastatin with lipid profile performed 09/07/2019 revealed a total cholesterol 122, LDL 50 and HDL 35.      Lorretta Harp MD [facility], [facility] 09/15/2019 11:06 AM

## 2019-09-15 NOTE — Assessment & Plan Note (Signed)
History of hyperlipidemia on simvastatin with lipid profile performed 09/07/2019 revealed a total cholesterol 122, LDL 50 and HDL 35.

## 2019-09-27 ENCOUNTER — Ambulatory Visit: Payer: Medicare Other | Admitting: Cardiovascular Disease

## 2019-09-27 ENCOUNTER — Telehealth (HOSPITAL_COMMUNITY): Payer: Self-pay

## 2019-09-27 NOTE — Telephone Encounter (Signed)
Encounter complete. 

## 2019-09-29 ENCOUNTER — Ambulatory Visit (HOSPITAL_COMMUNITY)
Admission: RE | Admit: 2019-09-29 | Discharge: 2019-09-29 | Disposition: A | Discharge status: Home / Self Care | Payer: Medicare Other | Source: Ambulatory Visit | Attending: Cardiology | Admitting: Cardiology

## 2019-09-29 ENCOUNTER — Other Ambulatory Visit: Payer: Self-pay

## 2019-09-29 DIAGNOSIS — R0789 Other chest pain: Secondary | ICD-10-CM | POA: Diagnosis present

## 2019-09-29 LAB — EXERCISE TOLERANCE TEST
Estimated workload: 8.3 METS
Exercise duration (min): 6 min
Exercise duration (sec): 52 s
MPHR: 138 {beats}/min
Peak HR: 136 {beats}/min
Percent HR: 98 %
Rest HR: 77 {beats}/min

## 2019-10-02 ENCOUNTER — Other Ambulatory Visit: Payer: Self-pay | Admitting: Registered Nurse

## 2019-10-02 DIAGNOSIS — R0989 Other specified symptoms and signs involving the circulatory and respiratory systems: Secondary | ICD-10-CM

## 2019-10-07 DIAGNOSIS — C61 Malignant neoplasm of prostate: Secondary | ICD-10-CM | POA: Diagnosis not present

## 2019-10-07 DIAGNOSIS — E291 Testicular hypofunction: Secondary | ICD-10-CM | POA: Diagnosis not present

## 2019-10-14 DIAGNOSIS — R3915 Urgency of urination: Secondary | ICD-10-CM | POA: Diagnosis not present

## 2019-10-14 DIAGNOSIS — N3941 Urge incontinence: Secondary | ICD-10-CM | POA: Diagnosis not present

## 2019-10-14 DIAGNOSIS — C61 Malignant neoplasm of prostate: Secondary | ICD-10-CM | POA: Diagnosis not present

## 2019-10-14 DIAGNOSIS — E291 Testicular hypofunction: Secondary | ICD-10-CM | POA: Diagnosis not present

## 2019-10-14 DIAGNOSIS — R350 Frequency of micturition: Secondary | ICD-10-CM | POA: Diagnosis not present

## 2019-11-14 DIAGNOSIS — M25512 Pain in left shoulder: Secondary | ICD-10-CM | POA: Diagnosis not present

## 2019-12-01 DIAGNOSIS — M25512 Pain in left shoulder: Secondary | ICD-10-CM | POA: Diagnosis not present

## 2019-12-02 DIAGNOSIS — H35033 Hypertensive retinopathy, bilateral: Secondary | ICD-10-CM | POA: Diagnosis not present

## 2019-12-02 DIAGNOSIS — H35013 Changes in retinal vascular appearance, bilateral: Secondary | ICD-10-CM | POA: Diagnosis not present

## 2019-12-02 DIAGNOSIS — H401111 Primary open-angle glaucoma, right eye, mild stage: Secondary | ICD-10-CM | POA: Diagnosis not present

## 2019-12-02 DIAGNOSIS — H401122 Primary open-angle glaucoma, left eye, moderate stage: Secondary | ICD-10-CM | POA: Diagnosis not present

## 2019-12-14 DIAGNOSIS — M25512 Pain in left shoulder: Secondary | ICD-10-CM | POA: Diagnosis not present

## 2019-12-27 ENCOUNTER — Ambulatory Visit (INDEPENDENT_AMBULATORY_CARE_PROVIDER_SITE_OTHER): Payer: Medicare Other | Admitting: Registered Nurse

## 2019-12-27 ENCOUNTER — Other Ambulatory Visit: Payer: Self-pay

## 2019-12-27 ENCOUNTER — Other Ambulatory Visit: Payer: Self-pay | Admitting: Family Medicine

## 2019-12-27 DIAGNOSIS — R0989 Other specified symptoms and signs involving the circulatory and respiratory systems: Secondary | ICD-10-CM

## 2019-12-27 DIAGNOSIS — Z23 Encounter for immunization: Secondary | ICD-10-CM

## 2019-12-27 NOTE — Telephone Encounter (Signed)
Requested Prescriptions  Pending Prescriptions Disp Refills   montelukast (SINGULAIR) 10 MG tablet [Pharmacy Med Name: MONTELUKAST SOD 10 MG TABLET] 90 tablet 2    Sig: TAKE 1 TABLET BY MOUTH EVERYDAY AT BEDTIME     Pulmonology:  Leukotriene Inhibitors Passed - 12/27/2019  1:28 AM      Passed - Valid encounter within last 12 months    Recent Outpatient Visits          3 months ago Feeling of chest tightness   [facility], Ranell Patrick, MD   5 months ago PND (post-nasal drip)   [facility], Ranell Patrick, MD   5 months ago Chest congestion   [facility] at [place], NP   9 months ago Diabetes mellitus type 2, diet-controlled [facility])   [facility], Ranell Patrick, MD   11 months ago Medicare annual wellness visit, subsequent   [facility], Arlie Solomons, MD      Future Appointments            In 2 months Carlota Raspberry, Ranell Patrick, MD [facility] at [place], [facility]

## 2020-01-09 DIAGNOSIS — Z23 Encounter for immunization: Secondary | ICD-10-CM | POA: Diagnosis not present

## 2020-02-13 ENCOUNTER — Other Ambulatory Visit: Payer: Self-pay | Admitting: Family Medicine

## 2020-02-13 DIAGNOSIS — R059 Cough, unspecified: Secondary | ICD-10-CM

## 2020-02-14 ENCOUNTER — Other Ambulatory Visit: Payer: Self-pay

## 2020-02-14 ENCOUNTER — Telehealth (INDEPENDENT_AMBULATORY_CARE_PROVIDER_SITE_OTHER): Payer: Medicare Other | Admitting: Registered Nurse

## 2020-02-14 DIAGNOSIS — R0989 Other specified symptoms and signs involving the circulatory and respiratory systems: Secondary | ICD-10-CM | POA: Diagnosis not present

## 2020-02-14 DIAGNOSIS — J45909 Unspecified asthma, uncomplicated: Secondary | ICD-10-CM | POA: Diagnosis not present

## 2020-02-14 MED ORDER — BENZONATATE 200 MG PO CAPS: 200.0000 mg | ORAL_CAPSULE | Freq: Two times a day (BID) | ORAL | 0 refills | Status: DC | PRN

## 2020-02-14 MED ORDER — MONTELUKAST SODIUM 10 MG PO TABS: ORAL_TABLET | ORAL | 2 refills | Status: DC

## 2020-02-14 MED ORDER — GUAIFENESIN-DM 100-10 MG/5ML PO SYRP: 5.0000 mL | ORAL_SOLUTION | ORAL | 0 refills | Status: DC | PRN

## 2020-02-14 NOTE — Patient Instructions (Signed)
Discussed checking BP in 1- 2 weeks with nurse visit. Routine ER precautions given.     If you have lab work done today you will be contacted with your lab results within the next 2 weeks.  If you have not heard from [place] then please contact us. The fastest way to get your results is to register for My Chart.   IF you received an x-ray today, you will receive an invoice from [facility]. Please contact [facility] at [phone] with questions or concerns regarding your invoice.   IF you received labwork today, you will receive an invoice from [facility]. Please contact [facility] at [phone] with questions or concerns regarding your invoice.   Our billing staff will not be able to assist you with questions regarding bills from these companies.  You will be contacted with the lab results as soon as they are available. The fastest way to get your results is to activate your My Chart account. Instructions are located on the last page of this paperwork. If you have not heard from [place] regarding the results in 2 weeks, please contact this office.

## 2020-02-21 ENCOUNTER — Telehealth (INDEPENDENT_AMBULATORY_CARE_PROVIDER_SITE_OTHER): Payer: Medicare Other | Admitting: Emergency Medicine

## 2020-02-21 ENCOUNTER — Other Ambulatory Visit: Payer: Self-pay

## 2020-02-21 ENCOUNTER — Encounter: Payer: Self-pay | Admitting: Emergency Medicine

## 2020-02-21 DIAGNOSIS — R059 Cough, unspecified: Secondary | ICD-10-CM

## 2020-02-21 DIAGNOSIS — J22 Unspecified acute lower respiratory infection: Secondary | ICD-10-CM | POA: Diagnosis not present

## 2020-02-21 MED ORDER — AZITHROMYCIN 250 MG PO TABS: ORAL_TABLET | ORAL | 0 refills | Status: DC

## 2020-02-21 MED ORDER — HYDROCODONE-HOMATROPINE 5-1.5 MG/5ML PO SYRP: 5.0000 mL | ORAL_SOLUTION | Freq: Every evening | ORAL | 0 refills | Status: DC | PRN

## 2020-02-21 NOTE — Patient Instructions (Signed)
° ° ° °  If you have lab work done today you will be contacted with your lab results within the next 2 weeks.  If you have not heard from us then please contact us. The fastest way to get your results is to register for My Chart. ° ° °IF you received an x-ray today, you will receive an invoice from [facility]. Please contact [facility] at [phone] with questions or concerns regarding your invoice.  ° °IF you received labwork today, you will receive an invoice from [facility]. Please contact [facility] at [phone] with questions or concerns regarding your invoice.  ° °Our billing staff will not be able to assist you with questions regarding bills from these companies. ° °You will be contacted with the lab results as soon as they are available. The fastest way to get your results is to activate your My Chart account. Instructions are located on the last page of this paperwork. If you have not heard from us regarding the results in 2 weeks, please contact this office. °  ° ° ° °

## 2020-02-21 NOTE — Progress Notes (Signed)
Telemedicine Encounter- SOAP NOTE Established Patient  This telephone encounter was conducted with the patient's (or proxy's) verbal consent via audio telecommunications: yes/no: Yes Patient was instructed to have this encounter in a suitably private space; and to only have persons present to whom they give permission to participate. In addition, patient identity was confirmed by use of name plus two identifiers (DOB and address).  I discussed the limitations, risks, security and privacy concerns of performing an evaluation and management service by telephone and the availability of in person appointments. I also discussed with the patient that there may be a patient responsible charge related to this service. The patient expressed understanding and agreed to proceed.  I spent a total of TIME; 0 MIN TO 60 MIN: 20 minutes talking with the patient or their proxy.  Chief Complaint  Patient presents with  . Cough    dry coughs in spells lasting around 5 mins - 10 days - follow up    Subjective   Douglas [name] is a 84 y.o. male established patient. Telephone visit today complaining of cough for the last 10 days. Intermittent episodes, at times productive, but mostly dry.  Goes through coughing spells lasting 5 minutes long. No other associated symptoms.  Denies difficulty breathing or chest pain.  No one else sick at home. No other complaints or medical concerns today.  HPI   Patient Active Problem List   Diagnosis Date Noted  . Atypical chest pain 09/15/2019  . Headache 05/17/2018  . Vitamin B12 deficiency 03/16/2017  . B12 deficiency 12/16/2016  . Dizziness 12/15/2016  . Nonintractable headache 12/15/2016  . Bladder outlet obstruction 04/25/2015  . Erectile dysfunction due to arterial insufficiency 04/25/2015  . Osteoporosis 04/25/2015  . Vitamin D deficiency 04/25/2015  . Heme positive stool 03/16/2014  . Abdominal pain, epigastric 03/16/2014  . Prostate cancer ([place])  05/30/2011  . Dyslipidemia 05/30/2011  . Hypogonadism male 05/30/2011  . Acute sinusitis 01/07/2007  . RHINITIS 08/25/2006    Past Medical History:  Diagnosis Date  . Adenomatous colon polyp 03/2001  . Allergy    SEASONAL  . Cancer ([place])   . Cataract   . Dizziness   . GERD (gastroesophageal reflux disease)   . Hearing loss   . Hyperlipidemia   . Low testosterone     Current Outpatient Medications  Medication Sig Dispense Refill  . acetaminophen (TYLENOL) 325 MG tablet Take 650 mg by mouth every 6 (six) hours as needed.    Marland Kitchen alfuzosin (UROXATRAL) 10 MG 24 hr tablet     . aspirin 81 MG tablet Take 81 mg by mouth daily.    . benzonatate (TESSALON) 200 MG capsule Take 1 capsule (200 mg total) by mouth 2 (two) times daily as needed for cough. 20 capsule 0  . cetirizine (ZYRTEC) 10 MG tablet Take 1 tablet (10 mg total) by mouth daily. 30 tablet 11  . Cholecalciferol (VITAMIN D) 2000 UNITS tablet Take 2,000 Units by mouth daily.    . fluticasone (FLONASE) 50 MCG/ACT nasal spray Place 1 spray into both nostrils daily. 16 g 5  . guaiFENesin-dextromethorphan (ROBITUSSIN DM) 100-10 MG/5ML syrup Take 5 mLs by mouth every 4 (four) hours as needed for cough. 118 mL 0  . latanoprost (XALATAN) 0.005 % ophthalmic solution Place 1 drop into the left eye at bedtime.    . montelukast (SINGULAIR) 10 MG tablet TAKE 1 TABLET BY MOUTH EVERYDAY AT BEDTIME 90 tablet 2  . Multiple Vitamin (MULTIVITAMIN) tablet  Take 1 tablet by mouth daily.    . pantoprazole (PROTONIX) 40 MG tablet Take 1 tablet (40 mg total) by mouth daily. 90 tablet 1  . RESTASIS 0.05 % ophthalmic emulsion INSTILL 1 DROP INTO BOTH EYES TWICE A DAY  3  . simvastatin (ZOCOR) 20 MG tablet TAKE 1 TABLET DAILY AT 6PM. 90 tablet 1  . Testosterone 30 MG/ACT SOLN Place 30 g onto the skin.     No current facility-administered medications for this visit.    No Known Allergies  Social History   Socioeconomic History  . Marital status:  Married    Spouse name: Not on file  . Number of children: 3  . Years of education: 2 years GT  . Highest education level: Not on file  Occupational History  . Occupation: RETIRED  Tobacco Use  . Smoking status: Never Smoker  . Smokeless tobacco: Never Used  Vaping Use  . Vaping Use: Never used  Substance and Sexual Activity  . Alcohol use: No    Alcohol/week: 0.0 standard drinks  . Drug use: No  . Sexual activity: Yes  Other Topics Concern  . Not on file  Social History Narrative   Married. Education: [employer]. Exercise: Yes.   Right-handed.   Drinks coffee daily.   Lives at home with his wife.   Social Determinants of Health   Financial Resource Strain:   . Difficulty of Paying Living Expenses: Not on file  Food Insecurity:   . Worried About Charity fundraiser in the Last Year: Not on file  . Ran Out of Food in the Last Year: Not on file  Transportation Needs:   . Lack of Transportation (Medical): Not on file  . Lack of Transportation (Non-Medical): Not on file  Physical Activity:   . Days of Exercise per Week: Not on file  . Minutes of Exercise per Session: Not on file  Stress:   . Feeling of Stress : Not on file  Social Connections:   . Frequency of Communication with Friends and Family: Not on file  . Frequency of Social Gatherings with Friends and Family: Not on file  . Attends Religious Services: Not on file  . Active Member of Clubs or Organizations: Not on file  . Attends Archivist Meetings: Not on file  . Marital Status: Not on file  Intimate Partner Violence:   . Fear of Current or Ex-Partner: Not on file  . Emotionally Abused: Not on file  . Physically Abused: Not on file  . Sexually Abused: Not on file    Review of Systems  Constitutional: Negative.  Negative for chills and fever.  [name]: Negative.  Negative for congestion and sore throat.   Respiratory: Negative.  Negative for cough and shortness of breath.   Cardiovascular: Negative.   Negative for chest pain and palpitations.  Gastrointestinal: Negative.  Negative for abdominal pain, diarrhea, nausea and vomiting.  Genitourinary: Negative.  Negative for dysuria and hematuria.  Skin: Negative.  Negative for rash.  Neurological: Negative for dizziness and headaches.  All other systems reviewed and are negative.   Objective  Alert and oriented x3 in no apparent respiratory distress Vitals as reported by the patient: There were no vitals filed for this visit.  There are no diagnoses linked to this encounter. Randee was seen today for cough.  Diagnoses and all orders for this visit:  Cough -     HYDROcodone-homatropine (HYCODAN) 5-1.5 MG/5ML syrup; Take 5 mLs by mouth at  bedtime as needed for cough.  Lower respiratory infection -     azithromycin (ZITHROMAX) 250 MG tablet; Sig as indicated     I discussed the assessment and treatment plan with the patient. The patient was provided an opportunity to ask questions and all were answered. The patient agreed with the plan and demonstrated an understanding of the instructions.   The patient was advised to call back or seek an in-person evaluation if the symptoms worsen or if the condition fails to improve as anticipated.  I provided 20 minutes of non-face-to-face time during this encounter.  [name], MD  Primary Care at [facility]

## 2020-02-29 ENCOUNTER — Encounter: Payer: Self-pay | Admitting: Registered Nurse

## 2020-02-29 ENCOUNTER — Other Ambulatory Visit: Payer: Self-pay | Admitting: Family Medicine

## 2020-02-29 ENCOUNTER — Other Ambulatory Visit: Payer: Self-pay

## 2020-02-29 ENCOUNTER — Ambulatory Visit (INDEPENDENT_AMBULATORY_CARE_PROVIDER_SITE_OTHER): Payer: Medicare Other

## 2020-02-29 ENCOUNTER — Ambulatory Visit (INDEPENDENT_AMBULATORY_CARE_PROVIDER_SITE_OTHER): Payer: Medicare Other | Admitting: Registered Nurse

## 2020-02-29 VITALS — BP 157/72 | HR 75 | Temp 97.6°F | Resp 17 | Ht 71.5 in | Wt 204.4 lb

## 2020-02-29 DIAGNOSIS — R059 Cough, unspecified: Secondary | ICD-10-CM | POA: Diagnosis not present

## 2020-02-29 DIAGNOSIS — R0989 Other specified symptoms and signs involving the circulatory and respiratory systems: Secondary | ICD-10-CM

## 2020-02-29 DIAGNOSIS — K219 Gastro-esophageal reflux disease without esophagitis: Secondary | ICD-10-CM

## 2020-02-29 MED ORDER — MONTELUKAST SODIUM 10 MG PO TABS: ORAL_TABLET | ORAL | 2 refills | Status: DC

## 2020-02-29 MED ORDER — ALBUTEROL SULFATE HFA 108 (90 BASE) MCG/ACT IN AERS: 2.0000 | INHALATION_SPRAY | Freq: Four times a day (QID) | RESPIRATORY_TRACT | 3 refills | Status: DC | PRN

## 2020-02-29 NOTE — Patient Instructions (Signed)
° ° ° °  If you have lab work done today you will be contacted with your lab results within the next 2 weeks.  If you have not heard from us then please contact us. The fastest way to get your results is to register for My Chart. ° ° °IF you received an x-ray today, you will receive an invoice from [facility]. Please contact  Radiology at [phone] with questions or concerns regarding your invoice.  ° °IF you received labwork today, you will receive an invoice from [facility]. Please contact [facility] at [phone] with questions or concerns regarding your invoice.  ° °Our billing staff will not be able to assist you with questions regarding bills from these companies. ° °You will be contacted with the lab results as soon as they are available. The fastest way to get your results is to activate your My Chart account. Instructions are located on the last page of this paperwork. If you have not heard from us regarding the results in 2 weeks, please contact this office. °  ° ° ° °

## 2020-03-07 ENCOUNTER — Encounter: Payer: Self-pay | Admitting: Family Medicine

## 2020-03-07 ENCOUNTER — Ambulatory Visit (INDEPENDENT_AMBULATORY_CARE_PROVIDER_SITE_OTHER): Payer: Medicare Other | Admitting: Family Medicine

## 2020-03-07 ENCOUNTER — Other Ambulatory Visit: Payer: Self-pay

## 2020-03-07 VITALS — BP 142/82 | HR 85 | Temp 98.7°F | Ht 71.5 in | Wt 204.0 lb

## 2020-03-07 DIAGNOSIS — E119 Type 2 diabetes mellitus without complications: Secondary | ICD-10-CM

## 2020-03-07 DIAGNOSIS — R0789 Other chest pain: Secondary | ICD-10-CM

## 2020-03-07 DIAGNOSIS — R059 Cough, unspecified: Secondary | ICD-10-CM

## 2020-03-07 DIAGNOSIS — E785 Hyperlipidemia, unspecified: Secondary | ICD-10-CM | POA: Diagnosis not present

## 2020-03-07 LAB — COMPREHENSIVE METABOLIC PANEL
ALT: 29 IU/L (ref 0–44)
AST: 20 IU/L (ref 0–40)
Albumin/Globulin Ratio: 1.5 (ref 1.2–2.2)
Albumin: 4.3 g/dL (ref 3.6–4.6)
Alkaline Phosphatase: 57 IU/L (ref 44–121)
BUN/Creatinine Ratio: 8 — ABNORMAL LOW (ref 10–24)
BUN: 9 mg/dL (ref 8–27)
Bilirubin Total: 0.5 mg/dL (ref 0.0–1.2)
CO2: 25 mmol/L (ref 20–29)
Calcium: 9.9 mg/dL (ref 8.6–10.2)
Chloride: 99 mmol/L (ref 96–106)
Creatinine, Ser: 1.15 mg/dL (ref 0.76–1.27)
GFR calc Af Amer: 68 mL/min/{1.73_m2} (ref >59)
GFR calc non Af Amer: 59 mL/min/{1.73_m2} — ABNORMAL LOW (ref >59)
Globulin, Total: 2.9 g/dL (ref 1.5–4.5)
Glucose: 147 mg/dL — ABNORMAL HIGH (ref 65–99)
Potassium: 4.2 mmol/L (ref 3.5–5.2)
Sodium: 139 mmol/L (ref 134–144)
Total Protein: 7.2 g/dL (ref 6.0–8.5)

## 2020-03-07 LAB — LIPID PANEL
Chol/HDL Ratio: 3.6 ratio (ref 0.0–5.0)
Cholesterol, Total: 146 mg/dL (ref 100–199)
HDL: 41 mg/dL (ref >39)
LDL Chol Calc (NIH): 83 mg/dL (ref 0–99)
Triglycerides: 123 mg/dL (ref 0–149)
VLDL Cholesterol Cal: 22 mg/dL (ref 5–40)

## 2020-03-07 LAB — HEMOGLOBIN A1C
Est. average glucose Bld gHb Est-mCnc: 154 mg/dL
Hgb A1c MFr Bld: 7 % — ABNORMAL HIGH (ref 4.8–5.6)

## 2020-03-07 MED ORDER — FLOVENT HFA 44 MCG/ACT IN AERO: 1.0000 | INHALATION_SPRAY | Freq: Two times a day (BID) | RESPIRATORY_TRACT | 2 refills | Status: DC

## 2020-03-07 NOTE — Progress Notes (Addendum)
Subjective:  Patient ID: Douglas [name], male    DOB: Feb 19, 1936  Age: 84 y.o. MRN: [id number]  CC:  Chief Complaint  Patient presents with  . Follow-up    on diabetes and hyperlipidemia. PT reports no issues with BS since last OV. PT reports no change to diet since last OV.  Marland Kitchen Cough    Pt reports he has had a cough for more than a month now. cough is dry. Pt reports he has had coughing fits for so long he thought he was going to pass out.     HPI Douglas [name] presents for   Cough: Dry cough, past month.  Treated by other providers few times.  Asthma pump albuterol makes cough more. No audible wheeze. Taking every 6 hours since last week. Has not used this am.  No fever, dyspnea, tickle in throat. Occasional nasal congestion, some sneezing at times. Using flonase daily. Unsure if taking zyrtec.  Some saline ns helps at times.no ACE-I.  Cough same. More at night with lying down.  He is taking singulair since last week.  Hycodan cough syrup and azithromycin on 11/23 - no change.  On protonix prior, not sure he is taking denies heartburn symptoms.  Chest tightness at times with cough. No chest pain, not radiating, no associated n/v/diaphoresis/dyspnea.  Not coughing or chest tightness when exercising.  No new swelling.   Stress test after meeting with cardiologist in June for atypical chest pain. No ischemic changes, some ventricular ectopy during exercise.  DG Chest 2 View  Result Date: 02/29/2020 CLINICAL DATA:  Cough EXAM: CHEST - 2 VIEW COMPARISON:  07/09/2019 FINDINGS: The heart size and mediastinal contours are within normal limits. Both lungs are clear. The visualized skeletal structures are unremarkable. IMPRESSION: No active cardiopulmonary disease. Electronically Signed   By: Kerby Moors M.D.   On: 02/29/2020 10:59    Diabetes  Diet controlled.  Microalbumin: nl ratio in 03/2019 On statin. Optho, foot exam, pneumovax: up to date.  No home readings.  No home BP  readings.  BP Readings from Last 3 Encounters:  03/07/20 (!) 142/82  02/29/20 (!) 157/72  09/15/19 (!) 150/76     Lab Results  Component Value Date   HGBA1C 6.8 (H) 09/07/2019   HGBA1C 6.4 (H) 03/09/2019   HGBA1C 6.5 (H) 08/18/2018   Lab Results  Component Value Date   MICROALBUR 0.6 04/30/2015   LDLCALC 60 09/07/2019   CREATININE 1.15 09/07/2019   Hyperlipidemia: zocor 20mg  qd. No new side effects.   Lab Results  Component Value Date   CHOL 122 09/07/2019   HDL 35 (L) 09/07/2019   LDLCALC 60 09/07/2019   TRIG 157 (H) 09/07/2019   CHOLHDL 3.5 09/07/2019   Lab Results  Component Value Date   ALT 22 09/07/2019   AST 16 09/07/2019   ALKPHOS 59 09/07/2019   BILITOT 0.5 09/07/2019    History Patient Active Problem List   Diagnosis Date Noted  . Atypical chest pain 09/15/2019  . Headache 05/17/2018  . Vitamin B12 deficiency 03/16/2017  . B12 deficiency 12/16/2016  . Dizziness 12/15/2016  . Nonintractable headache 12/15/2016  . Bladder outlet obstruction 04/25/2015  . Erectile dysfunction due to arterial insufficiency 04/25/2015  . Osteoporosis 04/25/2015  . Vitamin D deficiency 04/25/2015  . Heme positive stool 03/16/2014  . Abdominal pain, epigastric 03/16/2014  . Prostate cancer ([place]) 05/30/2011  . Dyslipidemia 05/30/2011  . Hypogonadism male 05/30/2011  . Acute sinusitis 01/07/2007  . RHINITIS  08/25/2006   Past Medical History:  Diagnosis Date  . Adenomatous colon polyp 03/2001  . Allergy    SEASONAL  . Cancer ([place])   . Cataract   . Dizziness   . GERD (gastroesophageal reflux disease)   . Hearing loss   . Hyperlipidemia   . Low testosterone    Past Surgical History:  Procedure Laterality Date  . APPENDECTOMY    . COLONOSCOPY    . EYE SURGERY    . POLYPECTOMY    . PROSTATE SURGERY    . SPINE SURGERY     No Known Allergies Prior to Admission medications   Medication Sig Start Date End Date Taking? Authorizing Provider  acetaminophen  (TYLENOL) 325 MG tablet Take 650 mg by mouth every 6 (six) hours as needed. Patient not taking: Reported on 02/29/2020    [provider]  albuterol (VENTOLIN HFA) 108 (90 Base) MCG/ACT inhaler Inhale 2 puffs into the lungs every 6 (six) hours as needed for wheezing or shortness of breath. 02/29/20   Maximiano Coss, NP  alfuzosin (UROXATRAL) 10 MG 24 hr tablet  11/09/14   [provider]  aspirin 81 MG tablet Take 81 mg by mouth daily.    [provider]  azithromycin (ZITHROMAX) 250 MG tablet Sig as indicated 02/21/20   [name], MD  benzonatate (TESSALON) 200 MG capsule Take 1 capsule (200 mg total) by mouth 2 (two) times daily as needed for cough. Patient not taking: Reported on 02/29/2020 02/14/20   Maximiano Coss, NP  cetirizine (ZYRTEC) 10 MG tablet Take 1 tablet (10 mg total) by mouth daily. 09/07/19   [name], MD  Cholecalciferol (VITAMIN D) 2000 UNITS tablet Take 2,000 Units by mouth daily.    [provider]  fluticasone (FLONASE) 50 MCG/ACT nasal spray Place 1 spray into both nostrils daily. 09/07/19   [name], MD  guaiFENesin-dextromethorphan (ROBITUSSIN DM) 100-10 MG/5ML syrup Take 5 mLs by mouth every 4 (four) hours as needed for cough. Patient not taking: Reported on 02/29/2020 02/14/20   Maximiano Coss, NP  HYDROcodone-homatropine [facility]) 5-1.5 MG/5ML syrup Take 5 mLs by mouth at bedtime as needed for cough. Patient not taking: Reported on 02/29/2020 02/21/20   [name], MD  latanoprost (XALATAN) 0.005 % ophthalmic solution Place 1 drop into the left eye at bedtime. 07/14/19   [provider]  montelukast (SINGULAIR) 10 MG tablet TAKE 1 TABLET BY MOUTH EVERYDAY AT BEDTIME 02/29/20   Maximiano Coss, NP  Multiple Vitamin (MULTIVITAMIN) tablet Take 1 tablet by mouth daily.    [provider]  pantoprazole (PROTONIX) 40 MG tablet TAKE 1 TABLET BY MOUTH EVERY DAY 02/29/20   [name], MD   RESTASIS 0.05 % ophthalmic emulsion INSTILL 1 DROP INTO BOTH EYES TWICE A DAY 07/13/17   [provider]  simvastatin (ZOCOR) 20 MG tablet TAKE 1 TABLET DAILY AT 6PM. 09/07/19   [name], MD  Testosterone 30 MG/ACT SOLN Place 30 g onto the skin.    [provider]   Social History   Socioeconomic History  . Marital status: Married    Spouse name: Not on file  . Number of children: 3  . Years of education: 2 years [employer]  . Highest education level: Not on file  Occupational History  . Occupation: RETIRED  Tobacco Use  . Smoking status: Never Smoker  . Smokeless tobacco: Never Used  Vaping Use  . Vaping Use: Never used  Substance  and Sexual Activity  . Alcohol use: No    Alcohol/week: 0.0 standard drinks  . Drug use: No  . Sexual activity: Yes  Other Topics Concern  . Not on file  Social History Narrative   Married. Education: [employer]. Exercise: Yes.   Right-handed.   Drinks coffee daily.   Lives at home with his wife.   Social Determinants of Health   Financial Resource Strain:   . Difficulty of Paying Living Expenses: Not on file  Food Insecurity:   . Worried About Charity fundraiser in the Last Year: Not on file  . Ran Out of Food in the Last Year: Not on file  Transportation Needs:   . Lack of Transportation (Medical): Not on file  . Lack of Transportation (Non-Medical): Not on file  Physical Activity:   . Days of Exercise per Week: Not on file  . Minutes of Exercise per Session: Not on file  Stress:   . Feeling of Stress : Not on file  Social Connections:   . Frequency of Communication with Friends and Family: Not on file  . Frequency of Social Gatherings with Friends and Family: Not on file  . Attends Religious Services: Not on file  . Active Member of Clubs or Organizations: Not on file  . Attends Archivist Meetings: Not on file  . Marital Status: Not on file  Intimate Partner Violence:   . Fear of Current or Ex-Partner:  Not on file  . Emotionally Abused: Not on file  . Physically Abused: Not on file  . Sexually Abused: Not on file    Review of Systems   Objective:   Vitals:   03/07/20 0811 03/07/20 0856  BP: (!) 160/70 (!) 142/82  Pulse: 85   Temp: 98.7 F (37.1 C)   TempSrc: Temporal   SpO2: 96%   Weight: 204 lb (92.5 kg)   Height: 5' 11.5" (1.816 m)     fPeak flow reading is 425, about 82 % of predicted. (515 for 70yoM, 70 "tall) 89& if using 475 as predicted peak flow for age.   Physical Exam Vitals reviewed.  Constitutional:      Appearance: He is well-developed.  [name]:     Head: Normocephalic and atraumatic.  Eyes:     Pupils: Pupils are equal, round, and reactive to light.  Neck:     Vascular: No carotid bruit or JVD.  Cardiovascular:     Rate and Rhythm: Normal rate and regular rhythm.     Heart sounds: Normal heart sounds. No murmur heard.   Pulmonary:     Effort: Pulmonary effort is normal.     Breath sounds: Normal breath sounds. No rales.     Comments: No wheeze, rhonchi.  Speaking in full sentences.  Tight feeling in chest - unable to reproduce with palpation  Abdominal:     Tenderness: There is no abdominal tenderness.  Skin:    General: Skin is warm and dry.  Neurological:     Mental Status: He is alert and oriented to person, place, and time.     EKG: Sinus rhythm, rate 75.  Compared to September 15, 2019 EKG appears similar.  Including ST waves at that time.  No apparent acute findings.   Assessment & Plan:  Douglas [name] is a 84 y.o. male . Hyperlipidemia, unspecified hyperlipidemia type - Plan: Comprehensive metabolic panel, Lipid panel  -  Stable, tolerating current regimen. Medications refilled. Labs pending as above.   Cough -  Plan: fluticasone (FLOVENT HFA) 44 MCG/ACT inhaler  -Potentially multifactorial including postnasal drip with allergies, upper airway cough with reflux or postnasal drip, possible reactive airway with borderline peak flow.   Previous chest x-ray reassuring, afebrile, no dyspnea, similar symptoms at time of chest x-ray.  No significant change with antibiotics.  -Ensure that he is taking Flonase, Zyrtec, Singulair, and pantoprazole  -Add Flovent HFA, continue albuterol if needed for wheezing, shortness of breath, cough if it improves.  -Recheck next 2 to 3 weeks, sooner if worse, ER precautions  Diabetes mellitus type 2, diet-controlled ([place]) - Plan: Hemoglobin A1c  -Check A1c, continue to watch diet.   Chest tightness - Plan: EKG 12-Lead  -Likely related to cough, no acute findings EKG, previous stress test without ischemia.  ER/cardiology follow-up precautions discussed if symptoms persist for acute changes   Meds ordered this encounter  Medications  . fluticasone (FLOVENT HFA) 44 MCG/ACT inhaler    Sig: Inhale 1 puff into the lungs in the morning and at bedtime.    Dispense:  1 each    Refill:  2   Patient Instructions    Make sure you are using the Flonase nasal spray 1 to 2 sprays per nostril every day.  Additionally make sure you are taking cetirizine 10 mg each day, and okay to continue Singulair once per day for now.  Make sure you are taking pantoprazole once per day for possible heartburn because of the cough. Can add fluticasone steroid inhaler twice per day. Ok to use albuterol IF NEEDED for wheezing of shortness of breath (or if helps cough can use).   Although chest tightness could be due to the cough, if the tightness does not improve with the treatment above, I would recommend follow-up with cardiology, Dr. Gwenlyn Found.  If any acute changes or worsening symptoms be seen in the emergency room.   Return to the clinic or go to the nearest emergency room if any of your symptoms worsen or new symptoms occur.    Cough, Adult Coughing is a reflex that clears your throat and your airways (respiratory system). Coughing helps to heal and protect your lungs. It is normal to cough occasionally, but a cough  that happens with other symptoms or lasts a long time may be a sign of a condition that needs treatment. An acute cough may only last 2-3 weeks, while a chronic cough may last 8 or more weeks. Coughing is commonly caused by:  Infection of the respiratory systemby viruses or bacteria.  Breathing in substances that irritate your lungs.  Allergies.  Asthma.  Mucus that runs down the back of your throat (postnasal drip).  Smoking.  Acid backing up from the stomach into the esophagus (gastroesophageal reflux).  Certain medicines.  Chronic lung problems.  Other medical conditions such as heart failure or a blood clot in the lung (pulmonary embolism). Follow these instructions at home: Medicines  Take over-the-counter and prescription medicines only as told by your health care provider.  Talk with your health care provider before you take a cough suppressant medicine. Lifestyle   Avoid cigarette smoke. Do not use any products that contain nicotine or tobacco, such as cigarettes, e-cigarettes, and chewing tobacco. If you need help quitting, ask your health care provider.  Drink enough fluid to keep your urine pale yellow.  Avoid caffeine.  Do not drink alcohol if your health care provider tells you not to drink. General instructions   Pay close attention to changes in your cough.  Tell your health care provider about them.  Always cover your mouth when you cough.  Avoid things that make you cough, such as perfume, candles, cleaning products, or campfire or tobacco smoke.  If the air is dry, use a cool mist vaporizer or humidifier in your bedroom or your home to help loosen secretions.  If your cough is worse at night, try to sleep in a semi-upright position.  Rest as needed.  Keep all follow-up visits as told by your health care provider. This is important. Contact a health care provider if you:  Have new symptoms.  Cough up pus.  Have a cough that does not get  better after 2-3 weeks or gets worse.  Cannot control your cough with cough suppressant medicines and you are losing sleep.  Have pain that gets worse or pain that is not helped with medicine.  Have a fever.  Have unexplained weight loss.  Have night sweats. Get help right away if:  You cough up blood.  You have difficulty breathing.  Your heartbeat is very fast. These symptoms may represent a serious problem that is an emergency. Do not wait to see if the symptoms will go away. Get medical help right away. Call your local emergency services (911 in the [place] Do not drive yourself to the hospital. Summary  Coughing is a reflex that clears your throat and your airways. It is normal to cough occasionally, but a cough that happens with other symptoms or lasts a long time may be a sign of a condition that needs treatment.  Take over-the-counter and prescription medicines only as told by your health care provider.  Always cover your mouth when you cough.  Contact a health care provider if you have new symptoms or a cough that does not get better after 2-3 weeks or gets worse. This information is not intended to replace advice given to you by your health care provider. Make sure you discuss any questions you have with your health care provider. Document Revised: 04/05/2018 Document Reviewed: 04/05/2018 Elsevier Patient Education  [employer].   If you have lab work done today you will be contacted with your lab results within the next 2 weeks.  If you have not heard from [place] then please contact us. The fastest way to get your results is to register for My Chart.   IF you received an x-ray today, you will receive an invoice from [facility]. Please contact [facility] at [phone] with questions or concerns regarding your invoice.   IF you received labwork today, you will receive an invoice from [place]. Please contact [facility] at [phone] with  questions or concerns regarding your invoice.   Our billing staff will not be able to assist you with questions regarding bills from these companies.  You will be contacted with the lab results as soon as they are available. The fastest way to get your results is to activate your My Chart account. Instructions are located on the last page of this paperwork. If you have not heard from [place] regarding the results in 2 weeks, please contact this office.         Signed, [name], MD [facility]

## 2020-03-07 NOTE — Patient Instructions (Addendum)
Make sure you are using the Flonase nasal spray 1 to 2 sprays per nostril every day.  Additionally make sure you are taking cetirizine 10 mg each day, and okay to continue Singulair once per day for now.  Make sure you are taking pantoprazole once per day for possible heartburn because of the cough. Can add fluticasone steroid inhaler twice per day. Ok to use albuterol IF NEEDED for wheezing of shortness of breath (or if helps cough can use).   Although chest tightness could be due to the cough, if the tightness does not improve with the treatment above, I would recommend follow-up with cardiology, Dr. Gwenlyn Found.  If any acute changes or worsening symptoms be seen in the emergency room.   Return to the clinic or go to the nearest emergency room if any of your symptoms worsen or new symptoms occur.    Cough, Adult Coughing is a reflex that clears your throat and your airways (respiratory system). Coughing helps to heal and protect your lungs. It is normal to cough occasionally, but a cough that happens with other symptoms or lasts a long time may be a sign of a condition that needs treatment. An acute cough may only last 2-3 weeks, while a chronic cough may last 8 or more weeks. Coughing is commonly caused by:  Infection of the respiratory systemby viruses or bacteria.  Breathing in substances that irritate your lungs.  Allergies.  Asthma.  Mucus that runs down the back of your throat (postnasal drip).  Smoking.  Acid backing up from the stomach into the esophagus (gastroesophageal reflux).  Certain medicines.  Chronic lung problems.  Other medical conditions such as heart failure or a blood clot in the lung (pulmonary embolism). Follow these instructions at home: Medicines  Take over-the-counter and prescription medicines only as told by your health care provider.  Talk with your health care provider before you take a cough suppressant medicine. Lifestyle   Avoid cigarette  smoke. Do not use any products that contain nicotine or tobacco, such as cigarettes, e-cigarettes, and chewing tobacco. If you need help quitting, ask your health care provider.  Drink enough fluid to keep your urine pale yellow.  Avoid caffeine.  Do not drink alcohol if your health care provider tells you not to drink. General instructions   Pay close attention to changes in your cough. Tell your health care provider about them.  Always cover your mouth when you cough.  Avoid things that make you cough, such as perfume, candles, cleaning products, or campfire or tobacco smoke.  If the air is dry, use a cool mist vaporizer or humidifier in your bedroom or your home to help loosen secretions.  If your cough is worse at night, try to sleep in a semi-upright position.  Rest as needed.  Keep all follow-up visits as told by your health care provider. This is important. Contact a health care provider if you:  Have new symptoms.  Cough up pus.  Have a cough that does not get better after 2-3 weeks or gets worse.  Cannot control your cough with cough suppressant medicines and you are losing sleep.  Have pain that gets worse or pain that is not helped with medicine.  Have a fever.  Have unexplained weight loss.  Have night sweats. Get help right away if:  You cough up blood.  You have difficulty breathing.  Your heartbeat is very fast. These symptoms may represent a serious problem that is an emergency. Do not  wait to see if the symptoms will go away. Get medical help right away. Call your local emergency services (911 in the [place] Do not drive yourself to the hospital. Summary  Coughing is a reflex that clears your throat and your airways. It is normal to cough occasionally, but a cough that happens with other symptoms or lasts a long time may be a sign of a condition that needs treatment.  Take over-the-counter and prescription medicines only as told by your health care  provider.  Always cover your mouth when you cough.  Contact a health care provider if you have new symptoms or a cough that does not get better after 2-3 weeks or gets worse. This information is not intended to replace advice given to you by your health care provider. Make sure you discuss any questions you have with your health care provider. Document Revised: 04/05/2018 Document Reviewed: 04/05/2018 Elsevier Patient Education  [employer].   If you have lab work done today you will be contacted with your lab results within the next 2 weeks.  If you have not heard from [place] then please contact us. The fastest way to get your results is to register for My Chart.   IF you received an x-ray today, you will receive an invoice from [facility]. Please contact [facility] at [phone] with questions or concerns regarding your invoice.   IF you received labwork today, you will receive an invoice from [place]. Please contact [facility] at [phone] with questions or concerns regarding your invoice.   Our billing staff will not be able to assist you with questions regarding bills from these companies.  You will be contacted with the lab results as soon as they are available. The fastest way to get your results is to activate your My Chart account. Instructions are located on the last page of this paperwork. If you have not heard from [place] regarding the results in 2 weeks, please contact this office.

## 2020-03-12 ENCOUNTER — Ambulatory Visit (INDEPENDENT_AMBULATORY_CARE_PROVIDER_SITE_OTHER): Payer: Medicare Other | Admitting: Family Medicine

## 2020-03-12 VITALS — BP 142/82 | Ht 71.5 in | Wt 204.0 lb

## 2020-03-12 DIAGNOSIS — Z0000 Encounter for general adult medical examination without abnormal findings: Secondary | ICD-10-CM

## 2020-03-12 NOTE — Progress Notes (Signed)
Presents today for [employer] Visit   Date of last exam: 03-07-2020  Interpreter used for this visit? No  I connected with  Douglas [name] on 03/12/20 by a telephone application and verified that I am speaking with the correct person using two identifiers.   I discussed the limitations of evaluation and management by telemedicine. The patient expressed understanding and agreed to proceed.  Patient location: home  Provider location: in office  I provided 20 minutes of non face - to - face time during this encounter.   Patient Care Team: [name], MD as PCP - General (Family Medicine) Monna Fam, MD as Consulting Physician (Ophthalmology) Carolan Clines, MD (Inactive) as Consulting Physician (Urology) Jodi Marble, MD as Consulting Physician (Otolaryngology)   Other items to address today:   Discussed Eye/Dental Discussed Immunizations Follow up scheduled Dr. Carlota Raspberry 12/29 @ 8:20   Other Screening: Last screening for diabetes: 03/07/2020 Last lipid screening: 03/07/2020  ADVANCE DIRECTIVES: Discussed:yes On File:no Materials Provided: no  Immunization status:  Immunization History  Administered Date(s) Administered  . Fluad Quad(high Dose 65+) 11/30/2018, 12/27/2019  . Influenza Split 12/30/2010, 12/25/2011  . Influenza,inj,Quad PF,6+ Mos 12/07/2012, 12/13/2013, 12/06/2014, 12/07/2015, 12/18/2016, 12/21/2017  . PFIZER SARS-COV-2 Vaccination 05/07/2019, 05/30/2019, 01/09/2020  . Pneumococcal Conjugate-13 04/24/2014  . Pneumococcal Polysaccharide-23 05/31/2001, 06/08/2009  . Td 06/12/2008  . Zoster 03/31/2008     Health Maintenance Due  Topic Date Due  . FOOT EXAM  03/08/2020  . URINE MICROALBUMIN  03/08/2020     Functional Status Survey: Is the patient deaf or have difficulty hearing?: Yes (has ringing in ears all the time) Does the patient have difficulty seeing, even when wearing glasses/contacts?: No Does the  patient have difficulty concentrating, remembering, or making decisions?: No Does the patient have difficulty walking or climbing stairs?: No Does the patient have difficulty dressing or bathing?: No Does the patient have difficulty doing errands alone such as visiting a doctor's office or shopping?: No   6CIT Screen 03/12/2020 01/04/2019 11/14/2016  What Year? 0 points 0 points 0 points  What month? 0 points 0 points 0 points  What time? 0 points 0 points 0 points  Count back from 20 0 points 0 points 0 points  Months in reverse 0 points 0 points 2 points  Repeat phrase 0 points 0 points 0 points  Total Score 0 0 2      Flowsheet Row Clinical Support from 03/12/2020 in [place] at [place]  AUDIT-C Score 0       Home Environment:   Lives in a one story home-with basement No trouble climbing stairs No scattered rugs No grab bars Adequate lighting/ no clutter   Patient Active Problem List   Diagnosis Date Noted  . Atypical chest pain 09/15/2019  . Headache 05/17/2018  . Vitamin B12 deficiency 03/16/2017  . B12 deficiency 12/16/2016  . Dizziness 12/15/2016  . Nonintractable headache 12/15/2016  . Bladder outlet obstruction 04/25/2015  . Erectile dysfunction due to arterial insufficiency 04/25/2015  . Osteoporosis 04/25/2015  . Vitamin D deficiency 04/25/2015  . Heme positive stool 03/16/2014  . Abdominal pain, epigastric 03/16/2014  . Prostate cancer ([place]) 05/30/2011  . Dyslipidemia 05/30/2011  . Hypogonadism male 05/30/2011  . Acute sinusitis 01/07/2007  . RHINITIS 08/25/2006     Past Medical History:  Diagnosis Date  . Adenomatous colon polyp 03/2001  . Allergy    SEASONAL  . Cancer ([place])   . Cataract   .  Dizziness   . GERD (gastroesophageal reflux disease)   . Hearing loss   . Hyperlipidemia   . Low testosterone      Past Surgical History:  Procedure Laterality Date  . APPENDECTOMY    . COLONOSCOPY    . EYE SURGERY    . POLYPECTOMY    .  PROSTATE SURGERY    . SPINE SURGERY       Family History  Problem Relation Age of Onset  . Cancer Father 21       pancreatic  . Other Mother        age 31  . Prostate cancer Brother   . Colon cancer Neg Hx   . Esophageal cancer Neg Hx   . Liver cancer Neg Hx   . Pancreatic cancer Neg Hx   . Rectal cancer Neg Hx   . Stomach cancer Neg Hx      Social History   Socioeconomic History  . Marital status: Married    Spouse name: Not on file  . Number of children: 3  . Years of education: 2 years [employer]  . Highest education level: Not on file  Occupational History  . Occupation: RETIRED  Tobacco Use  . Smoking status: Never Smoker  . Smokeless tobacco: Never Used  Vaping Use  . Vaping Use: Never used  Substance and Sexual Activity  . Alcohol use: No    Alcohol/week: 0.0 standard drinks  . Drug use: No  . Sexual activity: Yes  Other Topics Concern  . Not on file  Social History Narrative   Married. Education: [employer]. Exercise: Yes.   Right-handed.   Drinks coffee daily.   Lives at home with his wife.   Social Determinants of Health   Financial Resource Strain: Not on file  Food Insecurity: Not on file  Transportation Needs: Not on file  Physical Activity: Not on file  Stress: Not on file  Social Connections: Not on file  Intimate Partner Violence: Not on file     No Known Allergies   Prior to Admission medications   Medication Sig Start Date End Date Taking? Authorizing Provider  albuterol (VENTOLIN HFA) 108 (90 Base) MCG/ACT inhaler Inhale 2 puffs into the lungs every 6 (six) hours as needed for wheezing or shortness of breath. 02/29/20  Yes Maximiano Coss, NP  alfuzosin (UROXATRAL) 10 MG 24 hr tablet  11/09/14  Yes [provider]  aspirin 81 MG tablet Take 81 mg by mouth daily.   Yes [provider]  azithromycin (ZITHROMAX) 250 MG tablet Sig as indicated 02/21/20  Yes [name], Ines Bloomer, MD  cetirizine (ZYRTEC) 10 MG tablet Take 1  tablet (10 mg total) by mouth daily. 09/07/19  Yes [name], MD  Cholecalciferol (VITAMIN D) 2000 UNITS tablet Take 2,000 Units by mouth daily.   Yes [provider]  fluticasone (FLONASE) 50 MCG/ACT nasal spray Place 1 spray into both nostrils daily. 09/07/19  Yes [name], MD  fluticasone (FLOVENT HFA) 44 MCG/ACT inhaler Inhale 1 puff into the lungs in the morning and at bedtime. 03/07/20  Yes [name], MD  latanoprost (XALATAN) 0.005 % ophthalmic solution Place 1 drop into the left eye at bedtime. 07/14/19  Yes [provider]  montelukast (SINGULAIR) 10 MG tablet TAKE 1 TABLET BY MOUTH EVERYDAY AT BEDTIME 02/29/20  Yes Maximiano Coss, NP  Multiple Vitamin (MULTIVITAMIN) tablet Take 1 tablet by mouth daily.   Yes [provider]  pantoprazole (PROTONIX) 40 MG  tablet TAKE 1 TABLET BY MOUTH EVERY DAY 02/29/20  Yes [name], MD  RESTASIS 0.05 % ophthalmic emulsion INSTILL 1 DROP INTO BOTH EYES TWICE A DAY 07/13/17  Yes [provider]  simvastatin (ZOCOR) 20 MG tablet TAKE 1 TABLET DAILY AT 6PM. 09/07/19  Yes [name], MD  Testosterone 30 MG/ACT SOLN Place 30 g onto the skin.   Yes [provider]  acetaminophen (TYLENOL) 325 MG tablet Take 650 mg by mouth every 6 (six) hours as needed. Patient not taking: Reported on 02/29/2020    [provider]  benzonatate (TESSALON) 200 MG capsule Take 1 capsule (200 mg total) by mouth 2 (two) times daily as needed for cough. Patient not taking: Reported on 02/29/2020 02/14/20   Maximiano Coss, NP  guaiFENesin-dextromethorphan [facility] DM) 100-10 MG/5ML syrup Take 5 mLs by mouth every 4 (four) hours as needed for cough. Patient not taking: Reported on 02/29/2020 02/14/20   Maximiano Coss, NP  HYDROcodone-homatropine [facility]) 5-1.5 MG/5ML syrup Take 5 mLs by mouth at bedtime as needed for cough. Patient not taking: Reported on 02/29/2020 02/21/20   [name],  MD     Depression screen [facility] 2/9 03/12/2020 02/29/2020 02/21/2020 09/07/2019 07/22/2019  Decreased Interest 0 0 0 0 0  Down, Depressed, Hopeless 0 0 0 0 0  PHQ - 2 Score 0 0 0 0 0     Fall Risk  03/12/2020 02/29/2020 02/21/2020 09/07/2019 07/22/2019  Falls in the past year? 0 0 0 0 0  Number falls in past yr: 0 0 0 - -  Injury with Fall? 0 0 0 - -  Follow up Falls evaluation completed;Education provided Falls evaluation completed Falls evaluation completed Falls evaluation completed Falls evaluation completed      PHYSICAL EXAM: BP (!) 142/82 Comment: taken from a previous visit/ not in clinic  Ht 5' 11.5" (1.816 m)   Wt 204 lb (92.5 kg)   BMI 28.06 kg/m    Wt Readings from Last 3 Encounters:  03/12/20 204 lb (92.5 kg)  03/07/20 204 lb (92.5 kg)  02/29/20 204 lb 6.4 oz (92.7 kg)       Education/Counseling provided regarding diet and exercise, prevention of chronic diseases, smoking/tobacco cessation, if applicable, and reviewed "Covered Medicare Preventive Services."

## 2020-03-12 NOTE — Patient Instructions (Addendum)
Thank you for taking time to come for your Medicare Wellness Visit. I appreciate your ongoing commitment to your health goals. Please review the following plan we discussed and let me know if I can assist you in the future.  Leroy Kennedy LPN  Preventive Care 82 Years and Older, Male Preventive care refers to lifestyle choices and visits with your health care provider that can promote health and wellness. This includes:  A yearly physical exam. This is also called an annual well check.  Regular dental and eye exams.  Immunizations.  Screening for certain conditions.  Healthy lifestyle choices, such as diet and exercise. What can I expect for my preventive care visit? Physical exam Your health care provider will check:  Height and weight. These may be used to calculate body mass index (BMI), which is a measurement that tells if you are at a healthy weight.  Heart rate and blood pressure.  Your skin for abnormal spots. Counseling Your health care provider may ask you questions about:  Alcohol, tobacco, and drug use.  Emotional well-being.  Home and relationship well-being.  Sexual activity.  Eating habits.  History of falls.  Memory and ability to understand (cognition).  Work and work Statistician. What immunizations do I need?  Influenza (flu) vaccine  This is recommended every year. Tetanus, diphtheria, and pertussis (Tdap) vaccine  You may need a Td booster every 10 years. Varicella (chickenpox) vaccine  You may need this vaccine if you have not already been vaccinated. Zoster (shingles) vaccine  You may need this after age 66. Pneumococcal conjugate (PCV13) vaccine  One dose is recommended after age 84. Pneumococcal polysaccharide (PPSV23) vaccine  One dose is recommended after age 88. Measles, mumps, and rubella (MMR) vaccine  You may need at least one dose of MMR if you were born in 1957 or later. You may also need a second dose. Meningococcal  conjugate (MenACWY) vaccine  You may need this if you have certain conditions. Hepatitis A vaccine  You may need this if you have certain conditions or if you travel or work in places where you may be exposed to hepatitis A. Hepatitis B vaccine  You may need this if you have certain conditions or if you travel or work in places where you may be exposed to hepatitis B. Haemophilus influenzae type b (Hib) vaccine  You may need this if you have certain conditions. You may receive vaccines as individual doses or as more than one vaccine together in one shot (combination vaccines). Talk with your health care provider about the risks and benefits of combination vaccines. What tests do I need? Blood tests  Lipid and cholesterol levels. These may be checked every 5 years, or more frequently depending on your overall health.  Hepatitis C test.  Hepatitis B test. Screening  Lung cancer screening. You may have this screening every year starting at age 24 if you have a 30-pack-year history of smoking and currently smoke or have quit within the past 15 years.  Colorectal cancer screening. All adults should have this screening starting at age 52 and continuing until age 61. Your health care provider may recommend screening at age 57 if you are at increased risk. You will have tests every 1-10 years, depending on your results and the type of screening test.  Prostate cancer screening. Recommendations will vary depending on your family history and other risks.  Diabetes screening. This is done by checking your blood sugar (glucose) after you have not eaten for  a while (fasting). You may have this done every 1-3 years.  Abdominal aortic aneurysm (AAA) screening. You may need this if you are a current or former smoker.  Sexually transmitted disease (STD) testing. Follow these instructions at home: Eating and drinking  Eat a diet that includes fresh fruits and vegetables, whole grains, lean  protein, and low-fat dairy products. Limit your intake of foods with high amounts of sugar, saturated fats, and salt.  Take vitamin and mineral supplements as recommended by your health care provider.  Do not drink alcohol if your health care provider tells you not to drink.  If you drink alcohol: ? Limit how much you have to 0-2 drinks a day. ? Be aware of how much alcohol is in your drink. In the [place] one drink equals one 12 oz bottle of beer (355 mL), one 5 oz glass of wine (148 mL), or one 1 oz glass of hard liquor (44 mL). Lifestyle  Take daily care of your teeth and gums.  Stay active. Exercise for at least 30 minutes on 5 or more days each week.  Do not use any products that contain nicotine or tobacco, such as cigarettes, e-cigarettes, and chewing tobacco. If you need help quitting, ask your health care provider.  If you are sexually active, practice safe sex. Use a condom or other form of protection to prevent STIs (sexually transmitted infections).  Talk with your health care provider about taking a low-dose aspirin or statin. What's next?  Visit your health care provider once a year for a well check visit.  Ask your health care provider how often you should have your eyes and teeth checked.  Stay up to date on all vaccines. This information is not intended to replace advice given to you by your health care provider. Make sure you discuss any questions you have with your health care provider. Document Revised: 03/11/2018 Document Reviewed: 03/11/2018 Elsevier Patient Education  2020 Elsevier Inc.  

## 2020-03-14 ENCOUNTER — Other Ambulatory Visit: Payer: Self-pay | Admitting: Family Medicine

## 2020-03-14 DIAGNOSIS — E785 Hyperlipidemia, unspecified: Secondary | ICD-10-CM

## 2020-03-28 ENCOUNTER — Ambulatory Visit: Payer: Medicare Other | Admitting: Family Medicine

## 2020-04-03 ENCOUNTER — Ambulatory Visit: Payer: Medicare Other | Admitting: Family Medicine

## 2020-04-04 ENCOUNTER — Ambulatory Visit (INDEPENDENT_AMBULATORY_CARE_PROVIDER_SITE_OTHER): Payer: Medicare Other | Admitting: Family Medicine

## 2020-04-04 ENCOUNTER — Other Ambulatory Visit: Payer: Self-pay

## 2020-04-04 ENCOUNTER — Encounter: Payer: Self-pay | Admitting: Family Medicine

## 2020-04-04 VITALS — BP 110/64 | HR 79 | Temp 98.2°F | Ht 71.5 in | Wt 204.0 lb

## 2020-04-04 DIAGNOSIS — R059 Cough, unspecified: Secondary | ICD-10-CM

## 2020-04-04 DIAGNOSIS — J45909 Unspecified asthma, uncomplicated: Secondary | ICD-10-CM

## 2020-04-04 DIAGNOSIS — K219 Gastro-esophageal reflux disease without esophagitis: Secondary | ICD-10-CM

## 2020-04-04 MED ORDER — SUCRALFATE 1 GM/10ML PO SUSP: 1.0000 g | Freq: Three times a day (TID) | ORAL | 0 refills | Status: DC

## 2020-04-04 MED ORDER — FAMOTIDINE 20 MG PO TABS: 20.0000 mg | ORAL_TABLET | Freq: Every day | ORAL | 1 refills | Status: DC

## 2020-04-04 MED ORDER — MONTELUKAST SODIUM 10 MG PO TABS: 10.0000 mg | ORAL_TABLET | Freq: Every day | ORAL | 1 refills | Status: DC

## 2020-04-04 NOTE — Progress Notes (Signed)
1/5/202211:19 AM  Douglas [name] 1935/05/08, 85 y.o., male [id number]  Chief Complaint  Patient presents with  . Cough    During the day and drainage at night causes cough , states inhaler causes him to feel like his chest is swelling     HPI:   Patient is a 85 y.o. male with past medical history significant for prostate cancer, Osteoporosis, GERD, allergies who presents today for chronic cough.  Has been having issues with coughing at night Occasionally during the day Cough for 2 months Denies history of smoking  Takes pantoprazole daily  Takes cetrizine at night and flonase daily Has the Albuterol and fluticasone inhaler Feels like the fluticasone inhaler makes the cough worse  CXR: 02/29/20: no abnormalities seen   Depression screen [facility] 2/9 04/04/2020 03/12/2020 02/29/2020  Decreased Interest 0 0 0  Down, Depressed, Hopeless 0 0 0  PHQ - 2 Score 0 0 0    Fall Risk  04/04/2020 03/12/2020 02/29/2020 02/21/2020 09/07/2019  Falls in the past year? 0 0 0 0 0  Number falls in past yr: 0 0 0 0 -  Injury with Fall? 0 0 0 0 -  Follow up Falls evaluation completed Falls evaluation completed;Education provided Falls evaluation completed Falls evaluation completed Falls evaluation completed     No Known Allergies  Prior to Admission medications   Medication Sig Start Date End Date Taking? Authorizing Provider  alfuzosin (UROXATRAL) 10 MG 24 hr tablet  11/09/14  Yes [provider]  cetirizine (ZYRTEC) 10 MG tablet Take 1 tablet (10 mg total) by mouth daily. 09/07/19  Yes [name], MD  Cholecalciferol (VITAMIN D) 2000 UNITS tablet Take 2,000 Units by mouth daily.   Yes [provider]  fluticasone (FLONASE) 50 MCG/ACT nasal spray Place 1 spray into both nostrils daily. 09/07/19  Yes [name], MD  fluticasone (FLOVENT HFA) 44 MCG/ACT inhaler Inhale 1 puff into the lungs in the morning and at bedtime. 03/07/20  Yes [name], MD  latanoprost  (XALATAN) 0.005 % ophthalmic solution Place 1 drop into the left eye at bedtime. 07/14/19  Yes [provider]  Multiple Vitamin (MULTIVITAMIN) tablet Take 1 tablet by mouth daily.   Yes [provider]  RESTASIS 0.05 % ophthalmic emulsion INSTILL 1 DROP INTO BOTH EYES TWICE A DAY 07/13/17  Yes [provider]  simvastatin (ZOCOR) 20 MG tablet TAKE 1 TABLET DAILY AT 6PM. 03/14/20  Yes [name], MD  Testosterone 30 MG/ACT SOLN Place 30 g onto the skin.   Yes [provider]  albuterol (VENTOLIN HFA) 108 (90 Base) MCG/ACT inhaler Inhale 2 puffs into the lungs every 6 (six) hours as needed for wheezing or shortness of breath. Patient not taking: Reported on 04/04/2020 02/29/20   Maximiano Coss, NP  pantoprazole (PROTONIX) 40 MG tablet TAKE 1 TABLET BY MOUTH EVERY DAY 02/29/20   [name], MD    Past Medical History:  Diagnosis Date  . Adenomatous colon polyp 03/2001  . Allergy    SEASONAL  . Cancer ([place])   . Cataract   . Dizziness   . GERD (gastroesophageal reflux disease)   . Hearing loss   . Hyperlipidemia   . Low testosterone     Past Surgical History:  Procedure Laterality Date  . APPENDECTOMY    . COLONOSCOPY    . EYE SURGERY    . POLYPECTOMY    . PROSTATE SURGERY    . SPINE SURGERY  Social History   Tobacco Use  . Smoking status: Never Smoker  . Smokeless tobacco: Never Used  Substance Use Topics  . Alcohol use: No    Alcohol/week: 0.0 standard drinks    Family History  Problem Relation Age of Onset  . Cancer Father 68       pancreatic  . Other Mother        age 60  . Prostate cancer Brother   . Colon cancer Neg Hx   . Esophageal cancer Neg Hx   . Liver cancer Neg Hx   . Pancreatic cancer Neg Hx   . Rectal cancer Neg Hx   . Stomach cancer Neg Hx     Review of Systems  Constitutional: Negative for chills, fever and malaise/fatigue.  Eyes: Negative for blurred vision and double vision.  Respiratory:  Positive for cough. Negative for sputum production, shortness of breath and wheezing.   Cardiovascular: Negative for chest pain, palpitations and leg swelling.  Gastrointestinal: Positive for heartburn (Daily, doesn't take anything for this, Bitter taste in mouth, frequent belching, distension). Negative for abdominal pain, blood in stool, constipation, diarrhea, nausea and vomiting.  Genitourinary: Negative for dysuria, frequency and hematuria.  Musculoskeletal: Negative for back pain and joint pain.  Skin: Negative for rash.  Neurological: Negative for dizziness, weakness and headaches.     OBJECTIVE:  Today's Vitals   04/04/20 0943 04/04/20 0947  BP: (!) 161/70 110/64  Pulse: 79   Temp: 98.2 F (36.8 C)   SpO2: 97%   Weight: 204 lb (92.5 kg)   Height: 5' 11.5" (1.816 m)    Body mass index is 28.06 kg/m.  BP Readings from Last 3 Encounters:  04/04/20 110/64  03/12/20 (!) 142/82  03/07/20 (!) 142/82     Physical Exam Vitals reviewed.  Constitutional:      Appearance: Normal appearance.  [name]:     Head: Normocephalic and atraumatic.     Mouth/Throat:     Mouth: Mucous membranes are moist.     Pharynx: Oropharynx is clear. No oropharyngeal exudate or posterior oropharyngeal erythema.  Eyes:     Conjunctiva/sclera: Conjunctivae normal.     Pupils: Pupils are equal, round, and reactive to light.  Cardiovascular:     Rate and Rhythm: Normal rate and regular rhythm.     Pulses: Normal pulses.     Heart sounds: Normal heart sounds. No murmur heard. No friction rub. No gallop.   Pulmonary:     Effort: Pulmonary effort is normal. No respiratory distress.     Breath sounds: Normal breath sounds. No stridor. No wheezing or rales.  Abdominal:     General: Bowel sounds are normal.     Palpations: Abdomen is soft.     Tenderness: There is no abdominal tenderness.  Musculoskeletal:     Right lower leg: No edema.     Left lower leg: No edema.  Skin:    General: Skin is  warm and dry.  Neurological:     General: No focal deficit present.     Mental Status: He is alert and oriented to person, place, and time.  Psychiatric:        Mood and Affect: Mood normal.        Behavior: Behavior normal.     No results found for this or any previous visit (from the past 24 hour(s)).  No results found.   ASSESSMENT and PLAN  Problem List Items Addressed This Visit   None   Visit  Diagnoses    Asthma due to seasonal allergies    -  Primary   Relevant Medications   montelukast (SINGULAIR) 10 MG tablet   Gastroesophageal reflux disease without esophagitis       Relevant Medications   famotidine (PEPCID) 20 MG tablet   sucralfate (CARAFATE) 1 GM/10ML suspension   Other Relevant Orders   H. pylori breath test   Cough         Plan:  Follow up in 1 month  Continue daily pantoprazole,  Add on Singulair and famotidine at night  Get a humidifier for your room, use nightly  Take Carafate liquid prior to meals  Discussed adjusting inhalers, will follow up next appt  Discussed possible chest CT if cough symptoms do not improve  R/se/b of medications discussed  RTC precautions provided  Return in about 4 weeks (around 05/02/2020).    Huston Foley Shirley Bolle, FNP-BC [facility] [address], [place] [address] Ph.  [phone] Fax [phone]

## 2020-04-04 NOTE — Patient Instructions (Addendum)
Follow up in 1 month  Continue daily pantoprazole,  Add on Singulair and famotidine at night  Get a humidifier for your room, use nightly  Take Carafate liquid prior to meals  Cough, Adult A cough helps to clear your throat and lungs. A cough may be a sign of an illness or another medical condition. An acute cough may only last 2-3 weeks, while a chronic cough may last 8 or more weeks. Many things can cause a cough. They include:  Germs (viruses or bacteria) that attack the airway.  Breathing in things that bother (irritate) your lungs.  Allergies.  Asthma.  Mucus that runs down the back of your throat (postnasal drip).  Smoking.  Acid backing up from the stomach into the tube that moves food from the mouth to the stomach (gastroesophageal reflux).  Some medicines.  Lung problems.  Other medical conditions, such as heart failure or a blood clot in the lung (pulmonary embolism). Follow these instructions at home: Medicines  Take over-the-counter and prescription medicines only as told by your doctor.  Talk with your doctor before you take medicines that stop a cough (coughsuppressants). Lifestyle   Do not smoke, and try not to be around smoke. Do not use any products that contain nicotine or tobacco, such as cigarettes, e-cigarettes, and chewing tobacco. If you need help quitting, ask your doctor.  Drink enough fluid to keep your pee (urine) pale yellow.  Avoid caffeine.  Do not drink alcohol if your doctor tells you not to drink. General instructions   Watch for any changes in your cough. Tell your doctor about them.  Always cover your mouth when you cough.  Stay away from things that make you cough, such as perfume, candles, campfire smoke, or cleaning products.  If the air is dry, use a cool mist vaporizer or humidifier in your home.  If your cough is worse at night, try using extra pillows to raise your head up higher while you sleep.  Rest as  needed.  Keep all follow-up visits as told by your doctor. This is important. Contact a doctor if:  You have new symptoms.  You cough up pus.  Your cough does not get better after 2-3 weeks, or your cough gets worse.  Cough medicine does not help your cough and you are not sleeping well.  You have pain that gets worse or pain that is not helped with medicine.  You have a fever.  You are losing weight and you do not know why.  You have night sweats. Get help right away if:  You cough up blood.  You have trouble breathing.  Your heartbeat is very fast. These symptoms may be an emergency. Do not wait to see if the symptoms will go away. Get medical help right away. Call your local emergency services (911 in the [place] Do not drive yourself to the hospital. Summary  A cough helps to clear your throat and lungs. Many things can cause a cough.  Take over-the-counter and prescription medicines only as told by your doctor.  Always cover your mouth when you cough.  Contact a doctor if you have new symptoms or you have a cough that does not get better or gets worse. This information is not intended to replace advice given to you by your health care provider. Make sure you discuss any questions you have with your health care provider. Document Revised: 04/05/2018 Document Reviewed: 04/05/2018 Elsevier Patient Education  2020 [employer]  Gastroesophageal Reflux  Disease, Adult Gastroesophageal reflux (GER) happens when acid from the stomach flows up into the tube that connects the mouth and the stomach (esophagus). Normally, food travels down the esophagus and stays in the stomach to be digested. With GER, food and stomach acid sometimes move back up into the esophagus. You may have a disease called gastroesophageal reflux disease (GERD) if the reflux:  Happens often.  Causes frequent or very bad symptoms.  Causes problems such as damage to the esophagus. When this happens,  the esophagus becomes sore and swollen (inflamed). Over time, GERD can make small holes (ulcers) in the lining of the esophagus. What are the causes? This condition is caused by a problem with the muscle between the esophagus and the stomach. When this muscle is weak or not normal, it does not close properly to keep food and acid from coming back up from the stomach. The muscle can be weak because of:  Tobacco use.  Pregnancy.  Having a certain type of hernia (hiatal hernia).  Alcohol use.  Certain foods and drinks, such as coffee, chocolate, onions, and peppermint. What increases the risk? You are more likely to develop this condition if you:  Are overweight.  Have a disease that affects your connective tissue.  Use NSAID medicines. What are the signs or symptoms? Symptoms of this condition include:  Heartburn.  Difficult or painful swallowing.  The feeling of having a lump in the throat.  A bitter taste in the mouth.  Bad breath.  Having a lot of saliva.  Having an upset or bloated stomach.  Belching.  Chest pain. Different conditions can cause chest pain. Make sure you see your doctor if you have chest pain.  Shortness of breath or noisy breathing (wheezing).  Ongoing (chronic) cough or a cough at night.  Wearing away of the surface of teeth (tooth enamel).  Weight loss. How is this treated? Treatment will depend on how bad your symptoms are. Your doctor may suggest:  Changes to your diet.  Medicine.  Surgery. Follow these instructions at home: Eating and drinking   Follow a diet as told by your doctor. You may need to avoid foods and drinks such as: ? Coffee and tea (with or without caffeine). ? Drinks that contain alcohol. ? Energy drinks and sports drinks. ? Bubbly (carbonated) drinks or sodas. ? Chocolate and cocoa. ? Peppermint and mint flavorings. ? Garlic and onions. ? Horseradish. ? Spicy and acidic foods. These include peppers, chili  powder, curry powder, vinegar, hot sauces, and BBQ sauce. ? Citrus fruit juices and citrus fruits, such as oranges, lemons, and limes. ? Tomato-based foods. These include red sauce, chili, salsa, and pizza with red sauce. ? Fried and fatty foods. These include donuts, french fries, potato chips, and high-fat dressings. ? High-fat meats. These include hot dogs, rib eye steak, sausage, ham, and bacon. ? High-fat dairy items, such as whole milk, butter, and cream cheese.  Eat small meals often. Avoid eating large meals.  Avoid drinking large amounts of liquid with your meals.  Avoid eating meals during the 2-3 hours before bedtime.  Avoid lying down right after you eat.  Do not exercise right after you eat. Lifestyle   Do not use any products that contain nicotine or tobacco. These include cigarettes, e-cigarettes, and chewing tobacco. If you need help quitting, ask your doctor.  Try to lower your stress. If you need help doing this, ask your doctor.  If you are overweight, lose an amount of  weight that is healthy for you. Ask your doctor about a safe weight loss goal. General instructions  Pay attention to any changes in your symptoms.  Take over-the-counter and prescription medicines only as told by your doctor. Do not take aspirin, ibuprofen, or other NSAIDs unless your doctor says it is okay.  Wear loose clothes. Do not wear anything tight around your waist.  Raise (elevate) the head of your bed about 6 inches (15 cm).  Avoid bending over if this makes your symptoms worse.  Keep all follow-up visits as told by your doctor. This is important. Contact a doctor if:  You have new symptoms.  You lose weight and you do not know why.  You have trouble swallowing or it hurts to swallow.  You have wheezing or a cough that keeps happening.  Your symptoms do not get better with treatment.  You have a hoarse voice. Get help right away if:  You have pain in your arms, neck,  jaw, teeth, or back.  You feel sweaty, dizzy, or light-headed.  You have chest pain or shortness of breath.  You throw up (vomit) and your throw-up looks like blood or coffee grounds.  You pass out (faint).  Your poop (stool) is bloody or black.  You cannot swallow, drink, or eat. Summary  If a person has gastroesophageal reflux disease (GERD), food and stomach acid move back up into the esophagus and cause symptoms or problems such as damage to the esophagus.  Treatment will depend on how bad your symptoms are.  Follow a diet as told by your doctor.  Take all medicines only as told by your doctor. This information is not intended to replace advice given to you by your health care provider. Make sure you discuss any questions you have with your health care provider. Document Revised: 09/23/2017 Document Reviewed: 09/23/2017 Elsevier Patient Education  [employer].    If you have lab work done today you will be contacted with your lab results within the next 2 weeks.  If you have not heard from [place] then please contact us. The fastest way to get your results is to register for My Chart.   IF you received an x-ray today, you will receive an invoice from [facility]. Please contact [facility] at [phone] with questions or concerns regarding your invoice.   IF you received labwork today, you will receive an invoice from [place]. Please contact [facility] at [phone] with questions or concerns regarding your invoice.   Our billing staff will not be able to assist you with questions regarding bills from these companies.  You will be contacted with the lab results as soon as they are available. The fastest way to get your results is to activate your My Chart account. Instructions are located on the last page of this paperwork. If you have not heard from [place] regarding the results in 2 weeks, please contact this office.

## 2020-04-05 LAB — H. PYLORI BREATH TEST: H pylori Breath Test: NEGATIVE

## 2020-04-09 ENCOUNTER — Ambulatory Visit: Payer: Medicare Other | Admitting: Family Medicine

## 2020-04-13 DIAGNOSIS — C61 Malignant neoplasm of prostate: Secondary | ICD-10-CM | POA: Diagnosis not present

## 2020-04-13 DIAGNOSIS — E291 Testicular hypofunction: Secondary | ICD-10-CM | POA: Diagnosis not present

## 2020-04-23 NOTE — Progress Notes (Signed)
Telemedicine Encounter- SOAP NOTE Established Patient  This telephone encounter was conducted with the patient's (or proxy's) verbal consent via audio telecommunications: yes  Patient was instructed to have this encounter in a suitably private space; and to only have persons present to whom they give permission to participate. In addition, patient identity was confirmed by use of name plus two identifiers (DOB and address).  I discussed the limitations, risks, security and privacy concerns of performing an evaluation and management service by telephone and the availability of in person appointments. I also discussed with the patient that there may be a patient responsible charge related to this service. The patient expressed understanding and agreed to proceed.  I spent a total of 15 minutes talking with the patient or their proxy.  Patient at home Provider in office  Chief Complaint  Patient presents with  . Cough    dry cough for the past 5 days, taking otc not helping. The vough seems to happen yrly during this time. Asking or the benzonate cough pearls. Works the best    [name] is a 85 y.o. established patient. Telephone visit today for cough  HPI Ongoing for 5 days otcs not helping Seems like usual seasonal cough - thinks is a cold or bronchitis Gets symptoms like this every year Notes that he has received both doses of covid vaccine and booster No sick contacts Besides cough feeling well  Asking for tessalon as this has worked well in the past.  Patient Active Problem List   Diagnosis Date Noted  . Atypical chest pain 09/15/2019  . Headache 05/17/2018  . Vitamin B12 deficiency 03/16/2017  . B12 deficiency 12/16/2016  . Dizziness 12/15/2016  . Nonintractable headache 12/15/2016  . Bladder outlet obstruction 04/25/2015  . Erectile dysfunction due to arterial insufficiency 04/25/2015  . Osteoporosis 04/25/2015  . Vitamin D deficiency 04/25/2015   . Heme positive stool 03/16/2014  . Abdominal pain, epigastric 03/16/2014  . Prostate cancer ([place]) 05/30/2011  . Dyslipidemia 05/30/2011  . Hypogonadism male 05/30/2011  . Acute sinusitis 01/07/2007  . RHINITIS 08/25/2006    Past Medical History:  Diagnosis Date  . Adenomatous colon polyp 03/2001  . Allergy    SEASONAL  . Cancer ([place])   . Cataract   . Dizziness   . GERD (gastroesophageal reflux disease)   . Hearing loss   . Hyperlipidemia   . Low testosterone     Current Outpatient Medications  Medication Sig Dispense Refill  . alfuzosin (UROXATRAL) 10 MG 24 hr tablet     . cetirizine (ZYRTEC) 10 MG tablet Take 1 tablet (10 mg total) by mouth daily. 30 tablet 11  . Cholecalciferol (VITAMIN D) 2000 UNITS tablet Take 2,000 Units by mouth daily.    . fluticasone (FLONASE) 50 MCG/ACT nasal spray Place 1 spray into both nostrils daily. 16 g 5  . latanoprost (XALATAN) 0.005 % ophthalmic solution Place 1 drop into the left eye at bedtime.    . Multiple Vitamin (MULTIVITAMIN) tablet Take 1 tablet by mouth daily.    . RESTASIS 0.05 % ophthalmic emulsion INSTILL 1 DROP INTO BOTH EYES TWICE A DAY  3  . Testosterone 30 MG/ACT SOLN Place 30 g onto the skin.    Marland Kitchen albuterol (VENTOLIN HFA) 108 (90 Base) MCG/ACT inhaler Inhale 2 puffs into the lungs every 6 (six) hours as needed for wheezing or shortness of breath. (Patient not taking: Reported on 04/04/2020) 18 g 3  . famotidine (  PEPCID) 20 MG tablet Take 1 tablet (20 mg total) by mouth at bedtime. 60 tablet 1  . fluticasone (FLOVENT HFA) 44 MCG/ACT inhaler Inhale 1 puff into the lungs in the morning and at bedtime. 1 each 2  . montelukast (SINGULAIR) 10 MG tablet Take 1 tablet (10 mg total) by mouth at bedtime. 30 tablet 1  . pantoprazole (PROTONIX) 40 MG tablet TAKE 1 TABLET BY MOUTH EVERY DAY 90 tablet 1  . simvastatin (ZOCOR) 20 MG tablet TAKE 1 TABLET DAILY AT 6PM. 90 tablet 1  . sucralfate (CARAFATE) 1 GM/10ML suspension Take 10 mLs (1 g  total) by mouth 4 (four) times daily -  with meals and at bedtime for 14 days. 560 mL 0   No current facility-administered medications for this visit.    No Known Allergies  Social History   Socioeconomic History  . Marital status: Married    Spouse name: Not on file  . Number of children: 3  . Years of education: 2 years [employer]  . Highest education level: Not on file  Occupational History  . Occupation: RETIRED  Tobacco Use  . Smoking status: Never Smoker  . Smokeless tobacco: Never Used  Vaping Use  . Vaping Use: Never used  Substance and Sexual Activity  . Alcohol use: No    Alcohol/week: 0.0 standard drinks  . Drug use: No  . Sexual activity: Yes  Other Topics Concern  . Not on file  Social History Narrative   Married. Education: [employer]. Exercise: Yes.   Right-handed.   Drinks coffee daily.   Lives at home with his wife.   Social Determinants of Health   Financial Resource Strain: Not on file  Food Insecurity: Not on file  Transportation Needs: Not on file  Physical Activity: Not on file  Stress: Not on file  Social Connections: Not on file  Intimate Partner Violence: Not on file    Review of Systems  Constitutional: Negative.   [name]: Negative.   Eyes: Negative.   Respiratory: Positive for cough. Negative for hemoptysis, sputum production, shortness of breath and wheezing.   Cardiovascular: Negative.   Gastrointestinal: Negative.   Genitourinary: Negative.   Musculoskeletal: Negative.   Skin: Negative.   Neurological: Negative.   Endo/Heme/Allergies: Negative.   Psychiatric/Behavioral: Negative.   All other systems reviewed and are negative.   Objective   Vitals as reported by the patient: There were no vitals filed for this visit.  Douglas [name] was seen today for cough.  Diagnoses and all orders for this visit:  Asthma due to seasonal allergies -     Discontinue: benzonatate (TESSALON) 200 MG capsule; Take 1 capsule (200 mg total) by mouth 2 (two)  times daily as needed for cough. (Patient not taking: Reported on 02/29/2020)  Chest congestion -     Discontinue: montelukast (SINGULAIR) 10 MG tablet; TAKE 1 TABLET BY MOUTH EVERYDAY AT BEDTIME -     Discontinue: guaiFENesin-dextromethorphan (ROBITUSSIN DM) 100-10 MG/5ML syrup; Take 5 mLs by mouth every 4 (four) hours as needed for cough. (Patient not taking: Reported on 02/29/2020)   PLAN  Asthma vs infection  Short threshold for repeat covid testing  Will consider abx if symptoms persist over 7-10 days  Consider seeking in person treatment if symptoms worsen or fail to improve  Discussed ER precautions with patient who demonstrated understanding  Patient encouraged to call clinic with any questions, comments, or concerns.   I discussed the assessment and treatment plan with the patient. The patient  was provided an opportunity to ask questions and all were answered. The patient agreed with the plan and demonstrated an understanding of the instructions.   The patient was advised to call back or seek an in-person evaluation if the symptoms worsen or if the condition fails to improve as anticipated.  I provided 15 minutes of non-face-to-face time during this encounter.  Maximiano Coss, NP  Primary Care at [facility]

## 2020-05-07 ENCOUNTER — Ambulatory Visit: Payer: Medicare Other | Admitting: Family Medicine

## 2020-05-07 ENCOUNTER — Encounter: Payer: Self-pay | Admitting: Gastroenterology

## 2020-05-08 DIAGNOSIS — E291 Testicular hypofunction: Secondary | ICD-10-CM | POA: Diagnosis not present

## 2020-05-08 DIAGNOSIS — R9721 Rising PSA following treatment for malignant neoplasm of prostate: Secondary | ICD-10-CM | POA: Diagnosis not present

## 2020-05-08 DIAGNOSIS — C61 Malignant neoplasm of prostate: Secondary | ICD-10-CM | POA: Diagnosis not present

## 2020-05-09 ENCOUNTER — Other Ambulatory Visit (HOSPITAL_COMMUNITY): Payer: Self-pay | Admitting: Urology

## 2020-05-09 DIAGNOSIS — C61 Malignant neoplasm of prostate: Secondary | ICD-10-CM

## 2020-05-09 DIAGNOSIS — R9721 Rising PSA following treatment for malignant neoplasm of prostate: Secondary | ICD-10-CM

## 2020-05-22 ENCOUNTER — Ambulatory Visit (HOSPITAL_COMMUNITY)
Admission: RE | Admit: 2020-05-22 | Discharge: 2020-05-22 | Disposition: A | Discharge status: Home / Self Care | Payer: Medicare Other | Source: Ambulatory Visit | Attending: Urology | Admitting: Urology

## 2020-05-22 ENCOUNTER — Other Ambulatory Visit: Payer: Self-pay

## 2020-05-22 DIAGNOSIS — C61 Malignant neoplasm of prostate: Secondary | ICD-10-CM | POA: Diagnosis not present

## 2020-05-22 DIAGNOSIS — R9721 Rising PSA following treatment for malignant neoplasm of prostate: Secondary | ICD-10-CM | POA: Diagnosis not present

## 2020-05-22 MED ORDER — FLUDEOXYGLUCOSE F - 18 (FDG) INJECTION
9.3000 | Freq: Once | INTRAVENOUS | Status: AC | PRN
  Administered 2020-05-22: 9.3 via INTRAVENOUS

## 2020-06-04 ENCOUNTER — Encounter: Payer: Self-pay | Admitting: Registered Nurse

## 2020-06-04 NOTE — Progress Notes (Signed)
Established Patient Office Visit  Subjective:  Patient ID: Douglas [name], male    DOB: March 07, 1936  Age: 85 y.o. MRN: [id number]  CC:  Chief Complaint  Patient presents with  . Cough    Patient states that he has had an cough for over a month. Per patient its a dry cough most of the time but sometimes he get some mucus as well.. he said it get unbearable at night and cant seem to sleep with some chest tightness.    HPI Douglas [name] presents for cough x 1 mo Dry most of the time, occ green mucus.  Worse at night, hasn't been able to sleep well. Has taken multiple negative covid test No sick contacts No new exposures to allergens.  Past Medical History:  Diagnosis Date  . Adenomatous colon polyp 03/2001  . Allergy    SEASONAL  . Cancer ([place])   . Cataract   . Dizziness   . GERD (gastroesophageal reflux disease)   . Hearing loss   . Hyperlipidemia   . Low testosterone     Past Surgical History:  Procedure Laterality Date  . APPENDECTOMY    . COLONOSCOPY    . EYE SURGERY    . POLYPECTOMY    . PROSTATE SURGERY    . SPINE SURGERY      Family History  Problem Relation Age of Onset  . Cancer Father 89       pancreatic  . Other Mother        age 59  . Prostate cancer Brother   . Colon cancer Neg Hx   . Esophageal cancer Neg Hx   . Liver cancer Neg Hx   . Pancreatic cancer Neg Hx   . Rectal cancer Neg Hx   . Stomach cancer Neg Hx     Social History   Socioeconomic History  . Marital status: Married    Spouse name: Not on file  . Number of children: 3  . Years of education: 2 years GT  . Highest education level: Not on file  Occupational History  . Occupation: RETIRED  Tobacco Use  . Smoking status: Never Smoker  . Smokeless tobacco: Never Used  Vaping Use  . Vaping Use: Never used  Substance and Sexual Activity  . Alcohol use: No    Alcohol/week: 0.0 standard drinks  . Drug use: No  . Sexual activity: Yes  Other Topics Concern  . Not on file   Social History Narrative   Married. Education: [employer]. Exercise: Yes.   Right-handed.   Drinks coffee daily.   Lives at home with his wife.   Social Determinants of Health   Financial Resource Strain: Not on file  Food Insecurity: Not on file  Transportation Needs: Not on file  Physical Activity: Not on file  Stress: Not on file  Social Connections: Not on file  Intimate Partner Violence: Not on file    Outpatient Medications Prior to Visit  Medication Sig Dispense Refill  . alfuzosin (UROXATRAL) 10 MG 24 hr tablet     . cetirizine (ZYRTEC) 10 MG tablet Take 1 tablet (10 mg total) by mouth daily. 30 tablet 11  . Cholecalciferol (VITAMIN D) 2000 UNITS tablet Take 2,000 Units by mouth daily.    . fluticasone (FLONASE) 50 MCG/ACT nasal spray Place 1 spray into both nostrils daily. 16 g 5  . latanoprost (XALATAN) 0.005 % ophthalmic solution Place 1 drop into the left eye at bedtime.    Marland Kitchen  Multiple Vitamin (MULTIVITAMIN) tablet Take 1 tablet by mouth daily.    . RESTASIS 0.05 % ophthalmic emulsion INSTILL 1 DROP INTO BOTH EYES TWICE A DAY  3  . Testosterone 30 MG/ACT SOLN Place 30 g onto the skin.    Marland Kitchen aspirin 81 MG tablet Take 81 mg by mouth daily.    Marland Kitchen azithromycin (ZITHROMAX) 250 MG tablet Sig as indicated 6 tablet 0  . montelukast (SINGULAIR) 10 MG tablet TAKE 1 TABLET BY MOUTH EVERYDAY AT BEDTIME 90 tablet 2  . pantoprazole (PROTONIX) 40 MG tablet Take 1 tablet (40 mg total) by mouth daily. 90 tablet 1  . simvastatin (ZOCOR) 20 MG tablet TAKE 1 TABLET DAILY AT 6PM. 90 tablet 1  . acetaminophen (TYLENOL) 325 MG tablet Take 650 mg by mouth every 6 (six) hours as needed. (Patient not taking: Reported on 02/29/2020)    . benzonatate (TESSALON) 200 MG capsule Take 1 capsule (200 mg total) by mouth 2 (two) times daily as needed for cough. (Patient not taking: Reported on 02/29/2020) 20 capsule 0  . guaiFENesin-dextromethorphan (ROBITUSSIN DM) 100-10 MG/5ML syrup Take 5 mLs by mouth every  4 (four) hours as needed for cough. (Patient not taking: Reported on 02/29/2020) 118 mL 0  . HYDROcodone-homatropine (HYCODAN) 5-1.5 MG/5ML syrup Take 5 mLs by mouth at bedtime as needed for cough. (Patient not taking: Reported on 02/29/2020) 120 mL 0   No facility-administered medications prior to visit.    No Known Allergies  ROS Review of Systems  Constitutional: Negative.   [name]: Negative.   Eyes: Negative.   Respiratory: Positive for cough and chest tightness.   Cardiovascular: Negative.   Gastrointestinal: Negative.   Genitourinary: Negative.   Musculoskeletal: Negative.   Skin: Negative.   Neurological: Negative.   Psychiatric/Behavioral: Negative.   All other systems reviewed and are negative.     Objective:    Physical Exam Constitutional:      General: He is not in acute distress.    Appearance: Normal appearance. He is normal weight. He is not ill-appearing, toxic-appearing or diaphoretic.  Cardiovascular:     Rate and Rhythm: Normal rate and regular rhythm.     Heart sounds: Normal heart sounds. No murmur heard. No friction rub. No gallop.   Pulmonary:     Effort: Pulmonary effort is normal. No respiratory distress.     Breath sounds: Normal breath sounds. No stridor. No wheezing, rhonchi or rales.  Chest:     Chest wall: No tenderness.  Neurological:     General: No focal deficit present.     Mental Status: He is alert and oriented to person, place, and time. Mental status is at baseline.  Psychiatric:        Mood and Affect: Mood normal.        Behavior: Behavior normal.        Thought Content: Thought content normal.        Judgment: Judgment normal.     BP (!) 157/72   Pulse 75   Temp 97.6 F (36.4 C) (Temporal)   Resp 17   Ht 5' 11.5" (1.816 m)   Wt 204 lb 6.4 oz (92.7 kg)   SpO2 97%   BMI 28.11 kg/m  Wt Readings from Last 3 Encounters:  04/04/20 204 lb (92.5 kg)  03/12/20 204 lb (92.5 kg)  03/07/20 204 lb (92.5 kg)     Health  Maintenance Due  Topic Date Due  . FOOT EXAM  03/08/2020  . URINE  MICROALBUMIN  03/08/2020  . COLONOSCOPY (Pts 45-87yrs Insurance coverage will need to be confirmed)  04/30/2020    There are no preventive care reminders to display for this patient.  Lab Results  Component Value Date   TSH 0.921 12/15/2016   Lab Results  Component Value Date   WBC 5.0 10/25/2015   HGB 12.9 (L) 10/25/2015   HCT 38.3 (L) 10/25/2015   MCV 93.9 10/25/2015   PLT 186 10/25/2015   Lab Results  Component Value Date   NA 139 03/07/2020   K 4.2 03/07/2020   CO2 25 03/07/2020   GLUCOSE 147 (H) 03/07/2020   BUN 9 03/07/2020   CREATININE 1.15 03/07/2020   BILITOT 0.5 03/07/2020   ALKPHOS 57 03/07/2020   AST 20 03/07/2020   ALT 29 03/07/2020   PROT 7.2 03/07/2020   ALBUMIN 4.3 03/07/2020   CALCIUM 9.9 03/07/2020   Lab Results  Component Value Date   CHOL 146 03/07/2020   Lab Results  Component Value Date   HDL 41 03/07/2020   Lab Results  Component Value Date   LDLCALC 83 03/07/2020   Lab Results  Component Value Date   TRIG 123 03/07/2020   Lab Results  Component Value Date   CHOLHDL 3.6 03/07/2020   Lab Results  Component Value Date   HGBA1C 7.0 (H) 03/07/2020      Assessment & Plan:   Problem List Items Addressed This Visit   None   Visit Diagnoses    Cough    -  Primary   Relevant Medications   albuterol (VENTOLIN HFA) 108 (90 Base) MCG/ACT inhaler   Other Relevant Orders   DG Chest 2 View (Completed)   Chest congestion          Meds ordered this encounter  Medications  . DISCONTD: montelukast (SINGULAIR) 10 MG tablet    Sig: TAKE 1 TABLET BY MOUTH EVERYDAY AT BEDTIME    Dispense:  90 tablet    Refill:  2    Order Specific Question:   Supervising Provider    Answer:   Carlota Raspberry, JEFFREY R [[id number]]  . albuterol (VENTOLIN HFA) 108 (90 Base) MCG/ACT inhaler    Sig: Inhale 2 puffs into the lungs every 6 (six) hours as needed for wheezing or shortness of breath.     Dispense:  18 g    Refill:  3    Order Specific Question:   Supervising Provider    Answer:   Carlota Raspberry, JEFFREY R [[id number]]    Follow-up: No follow-ups on file.   PLAN  Suspect allergic irritant  Albuterol as needed  Order dg chest - no acute abnormalities noted  Montelukast before bed  Patient encouraged to call clinic with any questions, comments, or concerns.  Maximiano Coss, NP

## 2020-06-18 ENCOUNTER — Ambulatory Visit (INDEPENDENT_AMBULATORY_CARE_PROVIDER_SITE_OTHER): Payer: Medicare Other | Admitting: Gastroenterology

## 2020-06-18 ENCOUNTER — Encounter: Payer: Self-pay | Admitting: Gastroenterology

## 2020-06-18 ENCOUNTER — Other Ambulatory Visit: Payer: Self-pay | Admitting: Gastroenterology

## 2020-06-18 VITALS — BP 118/60 | HR 78 | Ht 71.0 in | Wt 263.0 lb

## 2020-06-18 DIAGNOSIS — K219 Gastro-esophageal reflux disease without esophagitis: Secondary | ICD-10-CM

## 2020-06-18 DIAGNOSIS — Z86010 Personal history of colonic polyps: Secondary | ICD-10-CM | POA: Diagnosis not present

## 2020-06-18 MED ORDER — PLENVU 140 G PO SOLR: 1.0000 | Freq: Once | ORAL | 0 refills | Status: AC

## 2020-06-18 NOTE — Progress Notes (Signed)
History of Present Illness: This is an 85 year old male referred by [name], MD for the evaluation of a personal history of adenomatous colon polyps.  Prior colonoscopies have shown numerous tubular adenomas.  His last colonoscopy performed in January 2019 had 5 tubular adenomas.  He has no gastrointestinal complaints.  He wants to discuss the pros and cons of having another colonoscopy.  His reflux symptoms are well controlled on daily pantoprazole.  Denies weight loss, abdominal pain, constipation, diarrhea, change in stool caliber, melena, hematochezia, nausea, vomiting, dysphagia, reflux symptoms, chest pain.    No Known Allergies Outpatient Medications Prior to Visit  Medication Sig Dispense Refill  . alfuzosin (UROXATRAL) 10 MG 24 hr tablet     . cetirizine (ZYRTEC) 10 MG tablet Take 1 tablet (10 mg total) by mouth daily. 30 tablet 11  . Cholecalciferol (VITAMIN D) 2000 UNITS tablet Take 2,000 Units by mouth daily.    . fluticasone (FLONASE) 50 MCG/ACT nasal spray Place 1 spray into both nostrils daily. 16 g 5  . latanoprost (XALATAN) 0.005 % ophthalmic solution Place 1 drop into the left eye at bedtime.    . montelukast (SINGULAIR) 10 MG tablet Take 1 tablet (10 mg total) by mouth at bedtime. 30 tablet 1  . Multiple Vitamin (MULTIVITAMIN) tablet Take 1 tablet by mouth daily.    . pantoprazole (PROTONIX) 40 MG tablet TAKE 1 TABLET BY MOUTH EVERY DAY 90 tablet 1  . RESTASIS 0.05 % ophthalmic emulsion INSTILL 1 DROP INTO BOTH EYES TWICE A DAY  3  . simvastatin (ZOCOR) 20 MG tablet TAKE 1 TABLET DAILY AT 6PM. 90 tablet 1  . Testosterone 30 MG/ACT SOLN Place 30 g onto the skin.    Marland Kitchen sucralfate (CARAFATE) 1 GM/10ML suspension Take 10 mLs (1 g total) by mouth 4 (four) times daily -  with meals and at bedtime for 14 days. 560 mL 0  . albuterol (VENTOLIN HFA) 108 (90 Base) MCG/ACT inhaler Inhale 2 puffs into the lungs every 6 (six) hours as needed for wheezing or shortness of  breath. (Patient not taking: Reported on 04/04/2020) 18 g 3  . famotidine (PEPCID) 20 MG tablet Take 1 tablet (20 mg total) by mouth at bedtime. 60 tablet 1  . fluticasone (FLOVENT HFA) 44 MCG/ACT inhaler Inhale 1 puff into the lungs in the morning and at bedtime. 1 each 2   No facility-administered medications prior to visit.   Past Medical History:  Diagnosis Date  . Adenomatous colon polyp 03/2001  . Allergy    SEASONAL  . Cancer ([place])   . Cataract   . Dizziness   . GERD (gastroesophageal reflux disease)   . Hearing loss   . Hyperlipidemia   . Low testosterone    Past Surgical History:  Procedure Laterality Date  . APPENDECTOMY    . COLONOSCOPY    . EYE SURGERY    . POLYPECTOMY    . PROSTATE SURGERY    . SPINE SURGERY     Social History   Socioeconomic History  . Marital status: Married    Spouse name: Not on file  . Number of children: 3  . Years of education: 2 years GT  . Highest education level: Not on file  Occupational History  . Occupation: RETIRED  Tobacco Use  . Smoking status: Never Smoker  . Smokeless tobacco: Never Used  Vaping Use  . Vaping Use: Never used  Substance and Sexual Activity  . Alcohol use:  No    Alcohol/week: 0.0 standard drinks  . Drug use: No  . Sexual activity: Yes  Other Topics Concern  . Not on file  Social History Narrative   Married. Education: [employer]. Exercise: Yes.   Right-handed.   Drinks coffee daily.   Lives at home with his wife.   Social Determinants of Health   Financial Resource Strain: Not on file  Food Insecurity: Not on file  Transportation Needs: Not on file  Physical Activity: Not on file  Stress: Not on file  Social Connections: Not on file   Family History  Problem Relation Age of Onset  . Cancer Father 51       pancreatic  . Other Mother        age 20  . Prostate cancer Brother   . Colon cancer Neg Hx   . Esophageal cancer Neg Hx   . Liver cancer Neg Hx   . Pancreatic cancer Neg Hx   .  Rectal cancer Neg Hx   . Stomach cancer Neg Hx       Review of Systems: Pertinent positive and negative review of systems were noted in the above HPI section. All other review of systems were otherwise negative.   Physical Exam: General: Well developed, well nourished, no acute distress Head: Normocephalic and atraumatic Eyes: Sclerae anicteric, EOMI Ears: Normal auditory acuity Mouth: Not examined, mask on during Covid-19 pandemic Neck: Supple, no masses or thyromegaly Lungs: Clear throughout to auscultation Heart: Regular rate and rhythm; no murmurs, rubs or bruits Abdomen: Soft, non tender and non distended. No masses, hepatosplenomegaly or hernias noted. Normal Bowel sounds Rectal: Deferred to colonoscopy Musculoskeletal: Symmetrical with no gross deformities  Skin: No lesions on visible extremities Pulses:  Normal pulses noted Extremities: No clubbing, cyanosis, edema or deformities noted Neurological: Alert oriented x 4, grossly nonfocal Cervical Nodes:  No significant cervical adenopathy Inguinal Nodes: No significant inguinal adenopathy Psychological:  Alert and cooperative. Normal mood and affect   Assessment and Recommendations:  1. Personal history of adenomatous colon polyps.  Given his history of numerous tubular adenomas over the years and his last colonoscopy with 5 tubular adenomas, I recommended that he proceed with colonoscopy and after discussion he agrees.  Schedule colonoscopy. The risks (including bleeding, perforation, infection, missed lesions, medication reactions and possible hospitalization or surgery if complications occur), benefits, and alternatives to colonoscopy with possible biopsy and possible polypectomy were discussed with the patient and they consent to proceed.    cc: [name], MD [address] [place],  [place] [address]

## 2020-06-18 NOTE — Patient Instructions (Signed)
You have been scheduled for a colonoscopy. Please follow written instructions given to you at your visit today.  Please pick up your prep supplies at the pharmacy within the next 1-3 days. If you use inhalers (even only as needed), please bring them with you on the day of your procedure.  Thank you for choosing me and [facility].  Malcolm T. Stark, Jr., MD., FACG  

## 2020-06-21 ENCOUNTER — Telehealth: Payer: Self-pay | Admitting: Gastroenterology

## 2020-06-22 NOTE — Telephone Encounter (Signed)
Left message for patient to return my call.

## 2020-06-22 NOTE — Telephone Encounter (Signed)
Informed patient his procedure is in May and that still gives [place] plenty of time to see if the Plenvu comes back from back order. Patient states he was told by the pharmacist that they do not anticipate getting the prep anytime soon. Offered to switch patient to Suprep with new instructions now or we can wait a month and see if the prep is available. Patient states he will check the pharmacy in a month and see if it is available and if not he will contact our office.

## 2020-06-22 NOTE — Telephone Encounter (Signed)
Pt returned your call. Pls call him again.

## 2020-06-25 ENCOUNTER — Ambulatory Visit
Admission: RE | Admit: 2020-06-25 | Discharge: 2020-06-25 | Disposition: A | Discharge status: Home / Self Care | Payer: Medicare Other | Source: Ambulatory Visit

## 2020-06-25 ENCOUNTER — Other Ambulatory Visit: Payer: Self-pay

## 2020-06-25 VITALS — BP 151/76 | HR 73 | Temp 98.1°F | Resp 18 | Ht 71.0 in | Wt 201.0 lb

## 2020-06-25 DIAGNOSIS — J019 Acute sinusitis, unspecified: Secondary | ICD-10-CM | POA: Diagnosis not present

## 2020-06-25 MED ORDER — AMOXICILLIN-POT CLAVULANATE 875-125 MG PO TABS: 1.0000 | ORAL_TABLET | Freq: Two times a day (BID) | ORAL | 0 refills | Status: DC

## 2020-06-25 MED ORDER — PREDNISONE 10 MG (21) PO TBPK: ORAL_TABLET | Freq: Every day | ORAL | 0 refills | Status: DC

## 2020-06-25 NOTE — ED Provider Notes (Signed)
[facility]    CSN: [id number] Arrival date & time: 06/25/20  0948      History   Chief Complaint Chief Complaint  Patient presents with  . Cough  . Nasal Congestion    HPI Douglas [name] is a 85 y.o. male.   Douglas [name] presents with complaints of cough and congestion which started a week ago. No shortness of breath  Or chest pain. Denies fevers chills, body aches or headaches. No gi symptoms. Tried mucinex which hasn't helped. Seasonal allergies and has been using prescribed medications including flonase but feels that it only provides temporary relief of sinus pressure. No known ill contacts.    ROS per HPI, negative if not otherwise mentioned.      Past Medical History:  Diagnosis Date  . Adenomatous colon polyp 03/2001  . Allergy    SEASONAL  . Cancer ([place])   . Cataract   . Dizziness   . GERD (gastroesophageal reflux disease)   . Hearing loss   . Hyperlipidemia   . Low testosterone     Patient Active Problem List   Diagnosis Date Noted  . Atypical chest pain 09/15/2019  . Headache 05/17/2018  . Vitamin B12 deficiency 03/16/2017  . B12 deficiency 12/16/2016  . Dizziness 12/15/2016  . Nonintractable headache 12/15/2016  . Bladder outlet obstruction 04/25/2015  . Erectile dysfunction due to arterial insufficiency 04/25/2015  . Osteoporosis 04/25/2015  . Vitamin D deficiency 04/25/2015  . Heme positive stool 03/16/2014  . Abdominal pain, epigastric 03/16/2014  . Prostate cancer ([place]) 05/30/2011  . Dyslipidemia 05/30/2011  . Hypogonadism male 05/30/2011  . Acute sinusitis 01/07/2007  . RHINITIS 08/25/2006    Past Surgical History:  Procedure Laterality Date  . APPENDECTOMY    . COLONOSCOPY    . EYE SURGERY    . POLYPECTOMY    . PROSTATE SURGERY    . SPINE SURGERY         Home Medications    Prior to Admission medications   Medication Sig Start Date End Date Taking? Authorizing Provider  amoxicillin-clavulanate (AUGMENTIN)  875-125 MG tablet Take 1 tablet by mouth every 12 (twelve) hours. 06/25/20  Yes Kadin Bera, Lanelle Bal B, NP  guaiFENesin (ROBITUSSIN) 100 MG/5ML SOLN Take 5 mLs by mouth every 4 (four) hours as needed for cough or to loosen phlegm.   Yes [provider]  predniSONE (STERAPRED UNI-PAK 21 TAB) 10 MG (21) TBPK tablet Take by mouth daily. Per box instruction 06/25/20  Yes Augusto Gamble B, NP  alfuzosin (UROXATRAL) 10 MG 24 hr tablet  11/09/14   [provider]  cetirizine (ZYRTEC) 10 MG tablet Take 1 tablet (10 mg total) by mouth daily. 09/07/19   [name], MD  Cholecalciferol (VITAMIN D) 2000 UNITS tablet Take 2,000 Units by mouth daily.    [provider]  fluticasone (FLONASE) 50 MCG/ACT nasal spray Place 1 spray into both nostrils daily. 09/07/19   [name], MD  latanoprost (XALATAN) 0.005 % ophthalmic solution Place 1 drop into the left eye at bedtime. 07/14/19   [provider]  montelukast (SINGULAIR) 10 MG tablet Take 1 tablet (10 mg total) by mouth at bedtime. 04/04/20   Just, Laurita Quint, FNP  Multiple Vitamin (MULTIVITAMIN) tablet Take 1 tablet by mouth daily.    [provider]  pantoprazole (PROTONIX) 40 MG tablet TAKE 1 TABLET BY MOUTH EVERY DAY 02/29/20   [name], MD  RESTASIS 0.05 % ophthalmic emulsion INSTILL 1  DROP INTO BOTH EYES TWICE A DAY 07/13/17   [provider]  simvastatin (ZOCOR) 20 MG tablet TAKE 1 TABLET DAILY AT 6PM. 03/14/20   [name], MD  sucralfate (CARAFATE) 1 GM/10ML suspension Take 10 mLs (1 g total) by mouth 4 (four) times daily -  with meals and at bedtime for 14 days. 04/04/20 04/18/20  Just, Laurita Quint, FNP  Testosterone 30 MG/ACT SOLN Place 30 g onto the skin.    [provider]    Family History Family History  Problem Relation Age of Onset  . Cancer Father 63       pancreatic  . Other Mother        age 77  . Prostate cancer Brother   . Colon cancer Neg Hx   . Esophageal cancer  Neg Hx   . Liver cancer Neg Hx   . Pancreatic cancer Neg Hx   . Rectal cancer Neg Hx   . Stomach cancer Neg Hx     Social History Social History   Tobacco Use  . Smoking status: Never Smoker  . Smokeless tobacco: Never Used  Vaping Use  . Vaping Use: Never used  Substance Use Topics  . Alcohol use: No    Alcohol/week: 0.0 standard drinks  . Drug use: No     Allergies   Patient has no known allergies.   Review of Systems Review of Systems   Physical Exam Triage Vital Signs ED Triage Vitals  Enc Vitals Group     BP 06/25/20 1049 (!) 151/76     Pulse Rate 06/25/20 1049 73     Resp 06/25/20 1049 18     Temp 06/25/20 1049 98.1 F (36.7 C)     Temp Source 06/25/20 1049 Oral     SpO2 06/25/20 1049 95 %     Weight 06/25/20 1045 201 lb (91.2 kg)     Height 06/25/20 1045 5\' 11"  (1.803 m)     Head Circumference --      Peak Flow --      Pain Score 06/25/20 1045 0     Pain Loc --      Pain Edu? --      Excl. in [place]? --    No data found.  Updated Vital Signs BP (!) 151/76 (BP Location: Left Arm)   Pulse 73   Temp 98.1 F (36.7 C) (Oral)   Resp 18   Ht 5\' 11"  (1.803 m)   Wt 201 lb (91.2 kg)   SpO2 95%   BMI 28.03 kg/m   Visual Acuity Right Eye Distance:   Left Eye Distance:   Bilateral Distance:    Right Eye Near:   Left Eye Near:    Bilateral Near:     Physical Exam Vitals reviewed.  Constitutional:      Appearance: He is well-developed.  [name]:     Head: Normocephalic and atraumatic.     Right Ear: Tympanic membrane, ear canal and external ear normal.     Left Ear: Tympanic membrane, ear canal and external ear normal.     Nose: Congestion present.     Right Sinus: No maxillary sinus tenderness or frontal sinus tenderness.     Left Sinus: No maxillary sinus tenderness or frontal sinus tenderness.     Mouth/Throat:     Mouth: Mucous membranes are moist.     Pharynx: Uvula midline.  Eyes:     Conjunctiva/sclera: Conjunctivae normal.     Pupils:  Pupils are equal, round, and reactive to light.  Cardiovascular:     Rate and Rhythm: Normal rate and regular rhythm.  Pulmonary:     Effort: Pulmonary effort is normal.     Breath sounds: Normal breath sounds.  Musculoskeletal:     Cervical back: Normal range of motion.  Lymphadenopathy:     Cervical: No cervical adenopathy.  Skin:    General: Skin is warm and dry.  Neurological:     Mental Status: He is alert and oriented to person, place, and time.      UC Treatments / Results  Labs (all labs ordered are listed, but only abnormal results are displayed) Labs Reviewed - No data to display  EKG   Radiology No results found.  Procedures Procedures (including critical care time)  Medications Ordered in UC Medications - No data to display  Initial Impression / Assessment and Plan / UC Course  I have reviewed the triage vital signs and the nursing notes.  Pertinent labs & imaging results that were available during my care of the patient were reviewed by me and considered in my medical decision making (see chart for details).     Lungs clear, vitals stable. No work of breathing. Facial and sinus pressure and nasal congestion primarily what has persisted for patient. Prednisone and augmentin provided today with follow up recommendations provided. Patient verbalized understanding and agreeable to plan.   Final Clinical Impressions(s) / UC Diagnoses   Final diagnoses:  Acute sinusitis, recurrence not specified, unspecified location     Discharge Instructions     Push fluids to ensure adequate hydration and keep secretions thin.  Continue with your regularly scheduled allergy medications and flonase.  Start prednisone pack and antibiotics provided.  If symptoms worsen or do not improve in the next week to return to be seen or to follow up with your PCP.     ED Prescriptions    Medication Sig Dispense Auth. Provider   predniSONE (STERAPRED UNI-PAK 21 TAB) 10 MG (21)  TBPK tablet Take by mouth daily. Per box instruction 21 tablet Augusto Gamble B, NP   amoxicillin-clavulanate (AUGMENTIN) 875-125 MG tablet Take 1 tablet by mouth every 12 (twelve) hours. 14 tablet Zigmund Gottron, NP     PDMP not reviewed this encounter.   Zigmund Gottron, NP 06/25/20 1108

## 2020-06-25 NOTE — Discharge Instructions (Signed)
Push fluids to ensure adequate hydration and keep secretions thin.  Continue with your regularly scheduled allergy medications and flonase.  Start prednisone pack and antibiotics provided.  If symptoms worsen or do not improve in the next week to return to be seen or to follow up with your PCP.

## 2020-06-25 NOTE — ED Triage Notes (Signed)
Pt presents to Urgent Care with c/o cough and nasal congestion x 1 week. Has been taking Robitussin w/o relief. Pt states he has had COVID and flu vaccines.

## 2020-06-28 DIAGNOSIS — C61 Malignant neoplasm of prostate: Secondary | ICD-10-CM | POA: Diagnosis not present

## 2020-07-05 DIAGNOSIS — E291 Testicular hypofunction: Secondary | ICD-10-CM | POA: Diagnosis not present

## 2020-07-05 DIAGNOSIS — C61 Malignant neoplasm of prostate: Secondary | ICD-10-CM | POA: Diagnosis not present

## 2020-07-12 DIAGNOSIS — Z23 Encounter for immunization: Secondary | ICD-10-CM | POA: Diagnosis not present

## 2020-07-13 DIAGNOSIS — H401122 Primary open-angle glaucoma, left eye, moderate stage: Secondary | ICD-10-CM | POA: Diagnosis not present

## 2020-07-13 DIAGNOSIS — H401111 Primary open-angle glaucoma, right eye, mild stage: Secondary | ICD-10-CM | POA: Diagnosis not present

## 2020-08-21 ENCOUNTER — Encounter: Payer: Medicare Other | Admitting: Gastroenterology

## 2020-09-17 ENCOUNTER — Other Ambulatory Visit: Payer: Self-pay | Admitting: Family Medicine

## 2020-09-17 DIAGNOSIS — E785 Hyperlipidemia, unspecified: Secondary | ICD-10-CM

## 2020-09-17 DIAGNOSIS — R0982 Postnasal drip: Secondary | ICD-10-CM

## 2020-09-17 DIAGNOSIS — R0981 Nasal congestion: Secondary | ICD-10-CM

## 2020-09-20 ENCOUNTER — Ambulatory Visit (INDEPENDENT_AMBULATORY_CARE_PROVIDER_SITE_OTHER): Payer: Medicare Other | Admitting: Emergency Medicine

## 2020-09-20 ENCOUNTER — Encounter: Payer: Self-pay | Admitting: Emergency Medicine

## 2020-09-20 ENCOUNTER — Other Ambulatory Visit: Payer: Self-pay

## 2020-09-20 VITALS — BP 148/80 | HR 81 | Temp 98.6°F | Ht 71.0 in | Wt 201.0 lb

## 2020-09-20 DIAGNOSIS — I700 Atherosclerosis of aorta: Secondary | ICD-10-CM | POA: Diagnosis not present

## 2020-09-20 DIAGNOSIS — E119 Type 2 diabetes mellitus without complications: Secondary | ICD-10-CM

## 2020-09-20 DIAGNOSIS — Z7689 Persons encountering health services in other specified circumstances: Secondary | ICD-10-CM

## 2020-09-20 DIAGNOSIS — Z8546 Personal history of malignant neoplasm of prostate: Secondary | ICD-10-CM | POA: Diagnosis not present

## 2020-09-20 LAB — COMPREHENSIVE METABOLIC PANEL
ALT: 26 U/L (ref 0–53)
AST: 19 U/L (ref 0–37)
Albumin: 4.4 g/dL (ref 3.5–5.2)
Alkaline Phosphatase: 53 U/L (ref 39–117)
BUN: 12 mg/dL (ref 6–23)
CO2: 29 mEq/L (ref 19–32)
Calcium: 9.8 mg/dL (ref 8.4–10.5)
Chloride: 101 mEq/L (ref 96–112)
Creatinine, Ser: 1.03 mg/dL (ref 0.40–1.50)
GFR: 67.27 mL/min (ref >60.00)
Glucose, Bld: 180 mg/dL — ABNORMAL HIGH (ref 70–99)
Potassium: 4.1 mEq/L (ref 3.5–5.1)
Sodium: 137 mEq/L (ref 135–145)
Total Bilirubin: 0.6 mg/dL (ref 0.2–1.2)
Total Protein: 7.8 g/dL (ref 6.0–8.3)

## 2020-09-20 LAB — HEMOGLOBIN A1C: Hgb A1c MFr Bld: 7.2 % — ABNORMAL HIGH (ref 4.6–6.5)

## 2020-09-20 NOTE — Patient Instructions (Signed)
Preventive Care 65 Years and Older, Male Preventive care refers to lifestyle choices and visits with your health care provider that can promote health and wellness. This includes: A yearly physical exam. This is also called an annual wellness visit. Regular dental and eye exams. Immunizations. Screening for certain conditions. Healthy lifestyle choices, such as: Eating a healthy diet. Getting regular exercise. Not using drugs or products that contain nicotine and tobacco. Limiting alcohol use. What can I expect for my preventive care visit? Physical exam Your health care provider will check your: Height and weight. These may be used to calculate your BMI (body mass index). BMI is a measurement that tells if you are at a healthy weight. Heart rate and blood pressure. Body temperature. Skin for abnormal spots. Counseling Your health care provider may ask you questions about your: Past medical problems. Family's medical history. Alcohol, tobacco, and drug use. Emotional well-being. Home life and relationship well-being. Sexual activity. Diet, exercise, and sleep habits. History of falls. Memory and ability to understand (cognition). Work and work environment. Access to firearms. What immunizations do I need?  Vaccines are usually given at various ages, according to a schedule. Your health care provider will recommend vaccines for you based on your age, medicalhistory, and lifestyle or other factors, such as travel or where you work. What tests do I need? Blood tests Lipid and cholesterol levels. These may be checked every 5 years, or more often depending on your overall health. Hepatitis C test. Hepatitis B test. Screening Lung cancer screening. You may have this screening every year starting at age 55 if you have a 30-pack-year history of smoking and currently smoke or have quit within the past 15 years. Colorectal cancer screening. All adults should have this screening  starting at age 50 and continuing until age 75. Your health care provider may recommend screening at age 45 if you are at increased risk. You will have tests every 1-10 years, depending on your results and the type of screening test. Prostate cancer screening. Recommendations will vary depending on your family history and other risks. Genital exam to check for testicular cancer or hernias. Diabetes screening. This is done by checking your blood sugar (glucose) after you have not eaten for a while (fasting). You may have this done every 1-3 years. Abdominal aortic aneurysm (AAA) screening. You may need this if you are a current or former smoker. STD (sexually transmitted disease) testing, if you are at risk. Follow these instructions at home: Eating and drinking  Eat a diet that includes fresh fruits and vegetables, whole grains, lean protein, and low-fat dairy products. Limit your intake of foods with high amounts of sugar, saturated fats, and salt. Take vitamin and mineral supplements as recommended by your health care provider. Do not drink alcohol if your health care provider tells you not to drink. If you drink alcohol: Limit how much you have to 0-2 drinks a day. Be aware of how much alcohol is in your drink. In the [place] one drink equals one 12 oz bottle of beer (355 mL), one 5 oz glass of wine (148 mL), or one 1 oz glass of hard liquor (44 mL).  Lifestyle Take daily care of your teeth and gums. Brush your teeth every morning and night with fluoride toothpaste. Floss one time each day. Stay active. Exercise for at least 30 minutes 5 or more days each week. Do not use any products that contain nicotine or tobacco, such as cigarettes, e-cigarettes, and chewing tobacco.   If you need help quitting, ask your health care provider. Do not use drugs. If you are sexually active, practice safe sex. Use a condom or other form of protection to prevent STIs (sexually transmitted infections). Talk  with your health care provider about taking a low-dose aspirin or statin. Find healthy ways to cope with stress, such as: Meditation, yoga, or listening to music. Journaling. Talking to a trusted person. Spending time with friends and family. Safety Always wear your seat belt while driving or riding in a vehicle. Do not drive: If you have been drinking alcohol. Do not ride with someone who has been drinking. When you are tired or distracted. While texting. Wear a helmet and other protective equipment during sports activities. If you have firearms in your house, make sure you follow all gun safety procedures. What's next? Visit your health care provider once a year for an annual wellness visit. Ask your health care provider how often you should have your eyes and teeth checked. Stay up to date on all vaccines. This information is not intended to replace advice given to you by your health care provider. Make sure you discuss any questions you have with your healthcare provider. Document Revised: 12/14/2018 Document Reviewed: 03/11/2018 Elsevier Patient Education  2022 [employer] Inc[employer]  

## 2020-09-20 NOTE — Progress Notes (Signed)
Call patient please.  Elevated glucose and hemoglobin A1c compatible with diabetes.  Recommend to decrease amount of daily carbohydrate intake.  Follow-up with me in 3 months.

## 2020-09-20 NOTE — Assessment & Plan Note (Signed)
Lab Results  Component Value Date   HGBA1C 7.0 (H) 03/07/2020  We will repeat A1c today.  On no medications.  Diet controlled.

## 2020-09-20 NOTE — Progress Notes (Signed)
Douglas [name] 85 y.o.   Chief Complaint  Patient presents with   TRANFER OF CARE    Former Dr Carlota Raspberry     HISTORY OF PRESENT ILLNESS: This is a 85 y.o. male former patient of Dr. Carlota Raspberry here to establish care with me. Has history of prostate cancer which was treated with radiation seed implants. Otherwise healthy male with a healthy lifestyle.  Walks on the treadmill 3-4 times per week.  No chest pain or difficulty breathing on exertion. Non-smoker.  HPI   Prior to Admission medications   Medication Sig Start Date End Date Taking? Authorizing Provider  alfuzosin (UROXATRAL) 10 MG 24 hr tablet  11/09/14  Yes [provider]  cetirizine (ZYRTEC) 10 MG tablet Take 1 tablet (10 mg total) by mouth daily. 09/07/19  Yes [name], MD  Cholecalciferol (VITAMIN D) 2000 UNITS tablet Take 2,000 Units by mouth daily.   Yes [provider]  fluticasone (FLONASE) 50 MCG/ACT nasal spray PLACE 1 SPRAY INTO BOTH NOSTRILS DAILY. 09/17/20  Yes [name], MD  latanoprost (XALATAN) 0.005 % ophthalmic solution Place 1 drop into the left eye at bedtime. 07/14/19  Yes [provider]  Multiple Vitamin (MULTIVITAMIN) tablet Take 1 tablet by mouth daily.   Yes [provider]  RESTASIS 0.05 % ophthalmic emulsion INSTILL 1 DROP INTO BOTH EYES TWICE A DAY 07/13/17  Yes [provider]  simvastatin (ZOCOR) 20 MG tablet TAKE 1 TABLET DAILY AT 6PM. 09/17/20  Yes [name], MD  Testosterone 30 MG/ACT SOLN Place 30 g onto the skin.   Yes [provider]  amoxicillin-clavulanate (AUGMENTIN) 875-125 MG tablet Take 1 tablet by mouth every 12 (twelve) hours. Patient not taking: Reported on 09/20/2020 06/25/20   Augusto Gamble B, NP  guaiFENesin (ROBITUSSIN) 100 MG/5ML SOLN Take 5 mLs by mouth every 4 (four) hours as needed for cough or to loosen phlegm. Patient not taking: Reported on 09/20/2020    [provider]  montelukast (SINGULAIR) 10  MG tablet Take 1 tablet (10 mg total) by mouth at bedtime. Patient not taking: Reported on 09/20/2020 04/04/20   Just, Laurita Quint, FNP  pantoprazole (PROTONIX) 40 MG tablet TAKE 1 TABLET BY MOUTH EVERY DAY Patient not taking: Reported on 09/20/2020 02/29/20   [name], MD  predniSONE (STERAPRED UNI-PAK 21 TAB) 10 MG (21) TBPK tablet Take by mouth daily. Per box instruction Patient not taking: Reported on 09/20/2020 06/25/20   Augusto Gamble B, NP  sucralfate (CARAFATE) 1 GM/10ML suspension Take 10 mLs (1 g total) by mouth 4 (four) times daily -  with meals and at bedtime for 14 days. 04/04/20 04/18/20  Just, Laurita Quint, FNP    No Known Allergies  Patient Active Problem List   Diagnosis Date Noted   Headache 05/17/2018   Vitamin B12 deficiency 03/16/2017   B12 deficiency 12/16/2016   Bladder outlet obstruction 04/25/2015   Erectile dysfunction due to arterial insufficiency 04/25/2015   Osteoporosis 04/25/2015   Vitamin D deficiency 04/25/2015   Prostate cancer ([place]) 05/30/2011   Dyslipidemia 05/30/2011   Hypogonadism male 05/30/2011    Past Medical History:  Diagnosis Date   Adenomatous colon polyp 03/2001   Allergy    SEASONAL   Cancer ([place])    Cataract    Dizziness    GERD (gastroesophageal reflux disease)    Hearing loss    Hyperlipidemia    Low testosterone     Past Surgical History:  Procedure Laterality  Date   APPENDECTOMY     COLONOSCOPY     EYE SURGERY     POLYPECTOMY     PROSTATE SURGERY     SPINE SURGERY      Social History   Socioeconomic History   Marital status: Married    Spouse name: Not on file   Number of children: 3   Years of education: 2 years [employer]   Highest education level: Not on file  Occupational History   Occupation: RETIRED  Tobacco Use   Smoking status: Never   Smokeless tobacco: Never  Vaping Use   Vaping Use: Never used  Substance and Sexual Activity   Alcohol use: No    Alcohol/week: 0.0 standard drinks   Drug use: No   Sexual  activity: Yes  Other Topics Concern   Not on file  Social History Narrative   Married. Education: [employer]. Exercise: Yes.   Right-handed.   Drinks coffee daily.   Lives at home with his wife.   Social Determinants of Health   Financial Resource Strain: Not on file  Food Insecurity: Not on file  Transportation Needs: Not on file  Physical Activity: Not on file  Stress: Not on file  Social Connections: Not on file  Intimate Partner Violence: Not on file    Family History  Problem Relation Age of Onset   Cancer Father 57       pancreatic   Other Mother        age 21   Prostate cancer Brother    Colon cancer Neg Hx    Esophageal cancer Neg Hx    Liver cancer Neg Hx    Pancreatic cancer Neg Hx    Rectal cancer Neg Hx    Stomach cancer Neg Hx      Review of Systems  Constitutional: Negative.  Negative for chills and fever.  [name]:  Negative for congestion and sore throat.   Respiratory: Negative.  Negative for cough and shortness of breath.   Cardiovascular: Negative.  Negative for chest pain and palpitations.  Gastrointestinal:  Negative for abdominal pain, diarrhea, nausea and vomiting.  Genitourinary: Negative.  Negative for dysuria and hematuria.  Skin: Negative.  Negative for rash.  Neurological: Negative.  Negative for dizziness and headaches.  All other systems reviewed and are negative.   Physical Exam Vitals reviewed.  Constitutional:      Appearance: Normal appearance.  [name]:     Head: Normocephalic.  Eyes:     Extraocular Movements: Extraocular movements intact.     Conjunctiva/sclera: Conjunctivae normal.     Pupils: Pupils are equal, round, and reactive to light.  Cardiovascular:     Rate and Rhythm: Normal rate and regular rhythm.     Pulses: Normal pulses.     Heart sounds: Normal heart sounds.  Pulmonary:     Effort: Pulmonary effort is normal.     Breath sounds: Normal breath sounds.  Musculoskeletal:     Cervical back: Normal range of  motion and neck supple.  Skin:    General: Skin is warm and dry.  Neurological:     General: No focal deficit present.     Mental Status: He is alert and oriented to person, place, and time.  Psychiatric:        Mood and Affect: Mood normal.        Behavior: Behavior normal.     ASSESSMENT & PLAN: A total of 30 minutes was spent with the patient and counseling/coordination of  care regarding establishing care with me, review of past medical history and chronic medical problems, review of most recent office visit notes by Dr. Carlota Raspberry, review of all medications, review of health maintenance items, review of lifestyle habits and nutrition, review of most recent blood work results, prognosis, documentation and need for follow-up. Clinically stable.  No medical concerns identified during this visit. History of prostate cancer Stable.  No concerns.  Diet-controlled diabetes mellitus ([place]) Lab Results  Component Value Date   HGBA1C 7.0 (H) 03/07/2020  We will repeat A1c today.  On no medications.  Diet controlled. Douglas [name] was seen today for tranfer of care.  Diagnoses and all orders for this visit:  Diet-controlled diabetes mellitus ([place]) -     Comprehensive metabolic panel -     Hemoglobin A1c  History of prostate cancer  Encounter to establish care  Patient Instructions  Preventive Care 33 Years and Older, Male Preventive care refers to lifestyle choices and visits with your health care provider that can promote health and wellness. This includes: A yearly physical exam. This is also called an annual wellness visit. Regular dental and eye exams. Immunizations. Screening for certain conditions. Healthy lifestyle choices, such as: Eating a healthy diet. Getting regular exercise. Not using drugs or products that contain nicotine and tobacco. Limiting alcohol use. What can I expect for my preventive care visit? Physical exam Your health care provider will check your: Height and  weight. These may be used to calculate your BMI (body mass index). BMI is a measurement that tells if you are at a healthy weight. Heart rate and blood pressure. Body temperature. Skin for abnormal spots. Counseling Your health care provider may ask you questions about your: Past medical problems. Family's medical history. Alcohol, tobacco, and drug use. Emotional well-being. Home life and relationship well-being. Sexual activity. Diet, exercise, and sleep habits. History of falls. Memory and ability to understand (cognition). Work and work Statistician. Access to firearms. What immunizations do I need?  Vaccines are usually given at various ages, according to a schedule. Your health care provider will recommend vaccines for you based on your age, medicalhistory, and lifestyle or other factors, such as travel or where you work. What tests do I need? Blood tests Lipid and cholesterol levels. These may be checked every 5 years, or more often depending on your overall health. Hepatitis C test. Hepatitis B test. Screening Lung cancer screening. You may have this screening every year starting at age 96 if you have a 30-pack-year history of smoking and currently smoke or have quit within the past 15 years. Colorectal cancer screening. All adults should have this screening starting at age 65 and continuing until age 63. Your health care provider may recommend screening at age 46 if you are at increased risk. You will have tests every 1-10 years, depending on your results and the type of screening test. Prostate cancer screening. Recommendations will vary depending on your family history and other risks. Genital exam to check for testicular cancer or hernias. Diabetes screening. This is done by checking your blood sugar (glucose) after you have not eaten for a while (fasting). You may have this done every 1-3 years. Abdominal aortic aneurysm (AAA) screening. You may need this if you are a  current or former smoker. STD (sexually transmitted disease) testing, if you are at risk. Follow these instructions at home: Eating and drinking  Eat a diet that includes fresh fruits and vegetables, whole grains, lean protein, and low-fat dairy products.  Limit your intake of foods with high amounts of sugar, saturated fats, and salt. Take vitamin and mineral supplements as recommended by your health care provider. Do not drink alcohol if your health care provider tells you not to drink. If you drink alcohol: Limit how much you have to 0-2 drinks a day. Be aware of how much alcohol is in your drink. In the [place] one drink equals one 12 oz bottle of beer (355 mL), one 5 oz glass of wine (148 mL), or one 1 oz glass of hard liquor (44 mL).  Lifestyle Take daily care of your teeth and gums. Brush your teeth every morning and night with fluoride toothpaste. Floss one time each day. Stay active. Exercise for at least 30 minutes 5 or more days each week. Do not use any products that contain nicotine or tobacco, such as cigarettes, e-cigarettes, and chewing tobacco. If you need help quitting, ask your health care provider. Do not use drugs. If you are sexually active, practice safe sex. Use a condom or other form of protection to prevent STIs (sexually transmitted infections). Talk with your health care provider about taking a low-dose aspirin or statin. Find healthy ways to cope with stress, such as: Meditation, yoga, or listening to music. Journaling. Talking to a trusted person. Spending time with friends and family. Safety Always wear your seat belt while driving or riding in a vehicle. Do not drive: If you have been drinking alcohol. Do not ride with someone who has been drinking. When you are tired or distracted. While texting. Wear a helmet and other protective equipment during sports activities. If you have firearms in your house, make sure you follow all gun safety  procedures. What's next? Visit your health care provider once a year for an annual wellness visit. Ask your health care provider how often you should have your eyes and teeth checked. Stay up to date on all vaccines. This information is not intended to replace advice given to you by your health care provider. Make sure you discuss any questions you have with your healthcare provider. Document Revised: 12/14/2018 Document Reviewed: 03/11/2018 Elsevier Patient Education  2022 [place], [place] [facility]

## 2020-09-20 NOTE — Assessment & Plan Note (Signed)
Stable.  No concerns. °

## 2020-09-24 ENCOUNTER — Other Ambulatory Visit: Payer: Self-pay | Admitting: Family Medicine

## 2020-09-24 DIAGNOSIS — R0981 Nasal congestion: Secondary | ICD-10-CM

## 2020-09-24 DIAGNOSIS — R0982 Postnasal drip: Secondary | ICD-10-CM

## 2020-09-24 DIAGNOSIS — K219 Gastro-esophageal reflux disease without esophagitis: Secondary | ICD-10-CM

## 2020-10-26 DIAGNOSIS — H401122 Primary open-angle glaucoma, left eye, moderate stage: Secondary | ICD-10-CM | POA: Diagnosis not present

## 2020-10-26 DIAGNOSIS — H401111 Primary open-angle glaucoma, right eye, mild stage: Secondary | ICD-10-CM | POA: Diagnosis not present

## 2020-12-12 ENCOUNTER — Other Ambulatory Visit: Payer: Self-pay

## 2020-12-12 ENCOUNTER — Encounter: Payer: Self-pay | Admitting: Emergency Medicine

## 2020-12-12 ENCOUNTER — Ambulatory Visit (INDEPENDENT_AMBULATORY_CARE_PROVIDER_SITE_OTHER): Payer: Medicare Other | Admitting: Emergency Medicine

## 2020-12-12 VITALS — BP 138/82 | HR 67 | Temp 97.9°F | Ht 71.0 in | Wt 202.0 lb

## 2020-12-12 DIAGNOSIS — E119 Type 2 diabetes mellitus without complications: Secondary | ICD-10-CM | POA: Diagnosis not present

## 2020-12-12 DIAGNOSIS — H9391 Unspecified disorder of right ear: Secondary | ICD-10-CM | POA: Diagnosis not present

## 2020-12-12 DIAGNOSIS — H9311 Tinnitus, right ear: Secondary | ICD-10-CM | POA: Diagnosis not present

## 2020-12-12 DIAGNOSIS — E785 Hyperlipidemia, unspecified: Secondary | ICD-10-CM

## 2020-12-12 DIAGNOSIS — Z23 Encounter for immunization: Secondary | ICD-10-CM | POA: Diagnosis not present

## 2020-12-12 LAB — COMPREHENSIVE METABOLIC PANEL
ALT: 22 U/L (ref 0–53)
AST: 17 U/L (ref 0–37)
Albumin: 4.1 g/dL (ref 3.5–5.2)
Alkaline Phosphatase: 48 U/L (ref 39–117)
BUN: 11 mg/dL (ref 6–23)
CO2: 31 mEq/L (ref 19–32)
Calcium: 10 mg/dL (ref 8.4–10.5)
Chloride: 100 mEq/L (ref 96–112)
Creatinine, Ser: 1.04 mg/dL (ref 0.40–1.50)
GFR: 66.39 mL/min (ref >60.00)
Glucose, Bld: 135 mg/dL — ABNORMAL HIGH (ref 70–99)
Potassium: 4.1 mEq/L (ref 3.5–5.1)
Sodium: 138 mEq/L (ref 135–145)
Total Bilirubin: 0.7 mg/dL (ref 0.2–1.2)
Total Protein: 7.6 g/dL (ref 6.0–8.3)

## 2020-12-12 LAB — HEMOGLOBIN A1C: Hgb A1c MFr Bld: 7.2 % — ABNORMAL HIGH (ref 4.6–6.5)

## 2020-12-12 MED ORDER — HYDROCORTISONE-ACETIC ACID 1-2 % OT SOLN: 3.0000 [drp] | Freq: Three times a day (TID) | OTIC | 1 refills | Status: AC

## 2020-12-12 NOTE — Patient Instructions (Signed)
Diabetes Mellitus and Nutrition, Adult When you have diabetes, or diabetes mellitus, it is very important to have healthy eating habits because your blood sugar (glucose) levels are greatly affected by what you eat and drink. Eating healthy foods in the right amounts, at about the same times every day, can help you:  Control your blood glucose.  Lower your risk of heart disease.  Improve your blood pressure.  Reach or maintain a healthy weight. What can affect my meal plan? Every person with diabetes is different, and each person has different needs for a meal plan. Your health care provider may recommend that you work with a dietitian to make a meal plan that is best for you. Your meal plan may vary depending on factors such as:  The calories you need.  The medicines you take.  Your weight.  Your blood glucose, blood pressure, and cholesterol levels.  Your activity level.  Other health conditions you have, such as heart or kidney disease. How do carbohydrates affect me? Carbohydrates, also called carbs, affect your blood glucose level more than any other type of food. Eating carbs naturally raises the amount of glucose in your blood. Carb counting is a method for keeping track of how many carbs you eat. Counting carbs is important to keep your blood glucose at a healthy level, especially if you use insulin or take certain oral diabetes medicines. It is important to know how many carbs you can safely have in each meal. This is different for every person. Your dietitian can help you calculate how many carbs you should have at each meal and for each snack. How does alcohol affect me? Alcohol can cause a sudden decrease in blood glucose (hypoglycemia), especially if you use insulin or take certain oral diabetes medicines. Hypoglycemia can be a life-threatening condition. Symptoms of hypoglycemia, such as sleepiness, dizziness, and confusion, are similar to symptoms of having too much  alcohol.  Do not drink alcohol if: ? Your health care provider tells you not to drink. ? You are pregnant, may be pregnant, or are planning to become pregnant.  If you drink alcohol: ? Do not drink on an empty stomach. ? Limit how much you use to:  0-1 drink a day for women.  0-2 drinks a day for men. ? Be aware of how much alcohol is in your drink. In the [place] one drink equals one 12 oz bottle of beer (355 mL), one 5 oz glass of wine (148 mL), or one 1 oz glass of hard liquor (44 mL). ? Keep yourself hydrated with water, diet soda, or unsweetened iced tea.  Keep in mind that regular soda, juice, and other mixers may contain a lot of sugar and must be counted as carbs. What are tips for following this plan? Reading food labels  Start by checking the serving size on the "Nutrition Facts" label of packaged foods and drinks. The amount of calories, carbs, fats, and other nutrients listed on the label is based on one serving of the item. Many items contain more than one serving per package.  Check the total grams (g) of carbs in one serving. You can calculate the number of servings of carbs in one serving by dividing the total carbs by 15. For example, if a food has 30 g of total carbs per serving, it would be equal to 2 servings of carbs.  Check the number of grams (g) of saturated fats and trans fats in one serving. Choose foods that have   a low amount or none of these fats.  Check the number of milligrams (mg) of salt (sodium) in one serving. Most people should limit total sodium intake to less than 2,300 mg per day.  Always check the nutrition information of foods labeled as "low-fat" or "nonfat." These foods may be higher in added sugar or refined carbs and should be avoided.  Talk to your dietitian to identify your daily goals for nutrients listed on the label. Shopping  Avoid buying canned, pre-made, or processed foods. These foods tend to be high in fat, sodium, and added  sugar.  Shop around the outside edge of the grocery store. This is where you will most often find fresh fruits and vegetables, bulk grains, fresh meats, and fresh dairy. Cooking  Use low-heat cooking methods, such as baking, instead of high-heat cooking methods like deep frying.  Cook using healthy oils, such as olive, canola, or sunflower oil.  Avoid cooking with butter, cream, or high-fat meats. Meal planning  Eat meals and snacks regularly, preferably at the same times every day. Avoid going long periods of time without eating.  Eat foods that are high in fiber, such as fresh fruits, vegetables, beans, and whole grains. Talk with your dietitian about how many servings of carbs you can eat at each meal.  Eat 4-6 oz (112-168 g) of lean protein each day, such as lean meat, chicken, fish, eggs, or tofu. One ounce (oz) of lean protein is equal to: ? 1 oz (28 g) of meat, chicken, or fish. ? 1 egg. ?  cup (62 g) of tofu.  Eat some foods each day that contain healthy fats, such as avocado, nuts, seeds, and fish.   What foods should I eat? Fruits Berries. Apples. Oranges. Peaches. Apricots. Plums. Grapes. Mango. Papaya. Pomegranate. Kiwi. Cherries. Vegetables Lettuce. Spinach. Leafy greens, including kale, chard, collard greens, and mustard greens. Beets. Cauliflower. Cabbage. Broccoli. Carrots. Green beans. Tomatoes. Peppers. Onions. Cucumbers. Brussels sprouts. Grains Whole grains, such as whole-wheat or whole-grain bread, crackers, tortillas, cereal, and pasta. Unsweetened oatmeal. Quinoa. Brown or wild rice. Meats and other proteins Seafood. Poultry without skin. Lean cuts of poultry and beef. Tofu. Nuts. Seeds. Dairy Low-fat or fat-free dairy products such as milk, yogurt, and cheese. The items listed above may not be a complete list of foods and beverages you can eat. Contact a dietitian for more information. What foods should I avoid? Fruits Fruits canned with  syrup. Vegetables Canned vegetables. Frozen vegetables with butter or cream sauce. Grains Refined white flour and flour products such as bread, pasta, snack foods, and cereals. Avoid all processed foods. Meats and other proteins Fatty cuts of meat. Poultry with skin. Breaded or fried meats. Processed meat. Avoid saturated fats. Dairy Full-fat yogurt, cheese, or milk. Beverages Sweetened drinks, such as soda or iced tea. The items listed above may not be a complete list of foods and beverages you should avoid. Contact a dietitian for more information. Questions to ask a health care provider  Do I need to meet with a diabetes educator?  Do I need to meet with a dietitian?  What number can I call if I have questions?  When are the best times to check my blood glucose? Where to find more information:  American Diabetes Association: diabetes.org  Academy of Nutrition and Dietetics: www.eatright.org  National Institute of Diabetes and Digestive and Kidney Diseases: www.niddk.nih.gov  Association of Diabetes Care and Education Specialists: www.diabeteseducator.org Summary  It is important to have healthy eating   habits because your blood sugar (glucose) levels are greatly affected by what you eat and drink.  A healthy meal plan will help you control your blood glucose and maintain a healthy lifestyle.  Your health care provider may recommend that you work with a dietitian to make a meal plan that is best for you.  Keep in mind that carbohydrates (carbs) and alcohol have immediate effects on your blood glucose levels. It is important to count carbs and to use alcohol carefully. This information is not intended to replace advice given to you by your health care provider. Make sure you discuss any questions you have with your health care provider. Document Revised: 02/22/2019 Document Reviewed: 02/22/2019 Elsevier Patient Education  2021 Elsevier Inc.  

## 2020-12-12 NOTE — Assessment & Plan Note (Signed)
Diet and nutrition discussed.  A1c done today. Stable.  Asymptomatic.

## 2020-12-12 NOTE — Assessment & Plan Note (Signed)
Diet and nutrition discussed. Continue simvastatin 20 mg daily. 

## 2020-12-12 NOTE — Assessment & Plan Note (Signed)
Needs ENT evaluation.  Has acute on chronic problem to right ear.

## 2020-12-12 NOTE — Progress Notes (Signed)
Douglas [name] 85 y.o.   Chief Complaint  Patient presents with   Diabetes    Follow up, pt states that he feels like there is fluid in his right ear/ x 2 wks    HISTORY OF PRESENT ILLNESS: This is a 85 y.o. male with history of diabetes on no medications, diet controlled, here for follow-up. Eating better.  Stays physically active.  Exercises regularly. Also complaining of right ear problem that started about 2 weeks ago.  Feels like there is fluid in his right ear with occasional discomfort.  Has history of chronic ringing to right ear. No other complaints or medical concerns today. History of prostate cancer treated with radiation implants.  Doing well.  Stable.  Has no urinary symptoms.   Diabetes Pertinent negatives for hypoglycemia include no dizziness or headaches. Pertinent negatives for diabetes include no chest pain.    Prior to Admission medications   Medication Sig Start Date End Date Taking? Authorizing Provider  alfuzosin (UROXATRAL) 10 MG 24 hr tablet  11/09/14  Yes [provider]  cetirizine (ZYRTEC) 10 MG tablet TAKE 1 TABLET BY MOUTH EVERY DAY 09/24/20  Yes Satomi Buda, Ines Bloomer, MD  Cholecalciferol (VITAMIN D) 2000 UNITS tablet Take 2,000 Units by mouth daily.   Yes [provider]  fluticasone (FLONASE) 50 MCG/ACT nasal spray PLACE 1 SPRAY INTO BOTH NOSTRILS DAILY. 09/17/20  Yes [name], MD  latanoprost (XALATAN) 0.005 % ophthalmic solution Place 1 drop into the left eye at bedtime. 07/14/19  Yes [provider]  Multiple Vitamin (MULTIVITAMIN) tablet Take 1 tablet by mouth daily.   Yes [provider]  pantoprazole (PROTONIX) 40 MG tablet TAKE 1 TABLET BY MOUTH EVERY DAY 09/24/20  Yes [employer], [place], MD  RESTASIS 0.05 % ophthalmic emulsion INSTILL 1 DROP INTO BOTH EYES TWICE A DAY 07/13/17  Yes [provider]  simvastatin (ZOCOR) 20 MG tablet TAKE 1 TABLET DAILY AT 6PM. 09/17/20  Yes [name], MD   Testosterone 30 MG/ACT SOLN Place 30 g onto the skin.   Yes [provider]    No Known Allergies  Patient Active Problem List   Diagnosis Date Noted   Diet-controlled diabetes mellitus ([place]) 09/20/2020   History of prostate cancer 09/20/2020   Atherosclerosis of aorta ([place]) 09/20/2020   Headache 05/17/2018   Vitamin B12 deficiency 03/16/2017   B12 deficiency 12/16/2016   Bladder outlet obstruction 04/25/2015   Erectile dysfunction due to arterial insufficiency 04/25/2015   Osteoporosis 04/25/2015   Vitamin D deficiency 04/25/2015   Prostate cancer ([place]) 05/30/2011   Dyslipidemia 05/30/2011   Hypogonadism male 05/30/2011    Past Medical History:  Diagnosis Date   Adenomatous colon polyp 03/2001   Allergy    SEASONAL   Cancer ([place])    Cataract    Dizziness    GERD (gastroesophageal reflux disease)    Hearing loss    Hyperlipidemia    Low testosterone     Past Surgical History:  Procedure Laterality Date   APPENDECTOMY     COLONOSCOPY     EYE SURGERY     POLYPECTOMY     PROSTATE SURGERY     SPINE SURGERY      Social History   Socioeconomic History   Marital status: Married    Spouse name: Not on file   Number of children: 3   Years of education: 2 years [employer]   Highest education level: Not on file  Occupational History  Occupation: RETIRED  Tobacco Use   Smoking status: Never   Smokeless tobacco: Never  Vaping Use   Vaping Use: Never used  Substance and Sexual Activity   Alcohol use: No    Alcohol/week: 0.0 standard drinks   Drug use: No   Sexual activity: Yes  Other Topics Concern   Not on file  Social History Narrative   Married. Education: [employer]. Exercise: Yes.   Right-handed.   Drinks coffee daily.   Lives at home with his wife.   Social Determinants of Health   Financial Resource Strain: Not on file  Food Insecurity: Not on file  Transportation Needs: Not on file  Physical Activity: Not on file  Stress: Not on file  Social  Connections: Not on file  Intimate Partner Violence: Not on file    Family History  Problem Relation Age of Onset   Cancer Father 52       pancreatic   Other Mother        age 48   Prostate cancer Brother    Colon cancer Neg Hx    Esophageal cancer Neg Hx    Liver cancer Neg Hx    Pancreatic cancer Neg Hx    Rectal cancer Neg Hx    Stomach cancer Neg Hx      Review of Systems  Constitutional: Negative.  Negative for chills and fever.  [name]:  Positive for ear pain and tinnitus.   Respiratory: Negative.  Negative for cough and shortness of breath.   Cardiovascular: Negative.  Negative for chest pain and palpitations.  Gastrointestinal:  Negative for abdominal pain, blood in stool, diarrhea, nausea and vomiting.  Genitourinary: Negative.  Negative for dysuria and hematuria.  Skin: Negative.  Negative for rash.  Neurological: Negative.  Negative for dizziness and headaches.  All other systems reviewed and are negative.  Today's Vitals   12/12/20 1018  BP: 138/82  Pulse: 67  Temp: 97.9 F (36.6 C)  TempSrc: Oral  SpO2: 98%  Weight: 202 lb (91.6 kg)  Height: '5\' 11"'$  (1.803 m)   Body mass index is 28.17 kg/m.  Physical Exam Vitals reviewed.  Constitutional:      Appearance: Normal appearance.  [name]:     Head: Normocephalic.     Right Ear: Tympanic membrane, ear canal and external ear normal.     Left Ear: Tympanic membrane, ear canal and external ear normal.     Mouth/Throat:     Mouth: Mucous membranes are moist.     Pharynx: Oropharynx is clear.  Eyes:     Extraocular Movements: Extraocular movements intact.     Conjunctiva/sclera: Conjunctivae normal.     Pupils: Pupils are equal, round, and reactive to light.  Neck:     Vascular: No carotid bruit.  Cardiovascular:     Rate and Rhythm: Normal rate and regular rhythm.     Pulses: Normal pulses.     Heart sounds: Normal heart sounds.  Pulmonary:     Effort: Pulmonary effort is normal.     Breath sounds:  Normal breath sounds.  Abdominal:     General: There is no distension.     Palpations: Abdomen is soft.     Tenderness: There is no abdominal tenderness.  Musculoskeletal:        General: Normal range of motion.     Cervical back: Neck supple. No tenderness.     Right lower leg: No edema.     Left lower leg: No edema.  Lymphadenopathy:  Cervical: No cervical adenopathy.  Skin:    General: Skin is warm and dry.     Capillary Refill: Capillary refill takes less than 2 seconds.  Neurological:     General: No focal deficit present.     Mental Status: He is alert and oriented to person, place, and time.  Psychiatric:        Mood and Affect: Mood normal.        Behavior: Behavior normal.     ASSESSMENT & PLAN: Diet-controlled diabetes mellitus ([place]) Diet and nutrition discussed.  A1c done today. Stable.  Asymptomatic.  Dyslipidemia Diet and nutrition discussed.  Continue simvastatin 20 mg daily.  Ringing in right ear Needs ENT evaluation.  Has acute on chronic problem to right ear.  Daschel was seen today for diabetes.  Diagnoses and all orders for this visit:  Diet-controlled diabetes mellitus ([place]) -     Hemoglobin A1c -     Comprehensive metabolic panel  Need for influenza vaccination -     Flu Vaccine QUAD High Dose(Fluad)  Ear problem, right -     Ambulatory referral to ENT -     acetic acid-hydrocortisone (VOSOL-HC) OTIC solution; Place 3 drops into the right ear 3 (three) times daily for 5 days.  Ringing in right ear -     Ambulatory referral to ENT  Dyslipidemia  Patient Instructions  Diabetes Mellitus and Nutrition, Adult When you have diabetes, or diabetes mellitus, it is very important to have healthy eating habits because your blood sugar (glucose) levels are greatly affected by what you eat and drink. Eating healthy foods in the right amounts, at about the same times every day, can help you: Control your blood glucose. Lower your risk of heart  disease. Improve your blood pressure. Reach or maintain a healthy weight. What can affect my meal plan? Every person with diabetes is different, and each person has different needs for a meal plan. Your health care provider may recommend that you work with a dietitian to make a meal plan that is best for you. Your meal plan may vary depending on factors such as: The calories you need. The medicines you take. Your weight. Your blood glucose, blood pressure, and cholesterol levels. Your activity level. Other health conditions you have, such as heart or kidney disease. How do carbohydrates affect me? Carbohydrates, also called carbs, affect your blood glucose level more than any other type of food. Eating carbs naturally raises the amount of glucose in your blood. Carb counting is a method for keeping track of how many carbs you eat. Counting carbs is important to keep your blood glucose at a healthy level, especially if you use insulin or take certain oral diabetes medicines. It is important to know how many carbs you can safely have in each meal. This is different for every person. Your dietitian can help you calculate how many carbs you should have at each meal and for each snack. How does alcohol affect me? Alcohol can cause a sudden decrease in blood glucose (hypoglycemia), especially if you use insulin or take certain oral diabetes medicines. Hypoglycemia can be a life-threatening condition. Symptoms of hypoglycemia, such as sleepiness, dizziness, and confusion, are similar to symptoms of having too much alcohol. Do not drink alcohol if: Your health care provider tells you not to drink. You are pregnant, may be pregnant, or are planning to become pregnant. If you drink alcohol: Do not drink on an empty stomach. Limit how much you use to:  0-1 drink a day for women. 0-2 drinks a day for men. Be aware of how much alcohol is in your drink. In the [place] one drink equals one 12 oz bottle of beer  (355 mL), one 5 oz glass of wine (148 mL), or one 1 oz glass of hard liquor (44 mL). Keep yourself hydrated with water, diet soda, or unsweetened iced tea. Keep in mind that regular soda, juice, and other mixers may contain a lot of sugar and must be counted as carbs. What are tips for following this plan? Reading food labels Start by checking the serving size on the "Nutrition Facts" label of packaged foods and drinks. The amount of calories, carbs, fats, and other nutrients listed on the label is based on one serving of the item. Many items contain more than one serving per package. Check the total grams (g) of carbs in one serving. You can calculate the number of servings of carbs in one serving by dividing the total carbs by 15. For example, if a food has 30 g of total carbs per serving, it would be equal to 2 servings of carbs. Check the number of grams (g) of saturated fats and trans fats in one serving. Choose foods that have a low amount or none of these fats. Check the number of milligrams (mg) of salt (sodium) in one serving. Most people should limit total sodium intake to less than 2,300 mg per day. Always check the nutrition information of foods labeled as "low-fat" or "nonfat." These foods may be higher in added sugar or refined carbs and should be avoided. Talk to your dietitian to identify your daily goals for nutrients listed on the label. Shopping Avoid buying canned, pre-made, or processed foods. These foods tend to be high in fat, sodium, and added sugar. Shop around the outside edge of the grocery store. This is where you will most often find fresh fruits and vegetables, bulk grains, fresh meats, and fresh dairy. Cooking Use low-heat cooking methods, such as baking, instead of high-heat cooking methods like deep frying. Cook using healthy oils, such as olive, canola, or sunflower oil. Avoid cooking with butter, cream, or high-fat meats. Meal planning Eat meals and snacks  regularly, preferably at the same times every day. Avoid going long periods of time without eating. Eat foods that are high in fiber, such as fresh fruits, vegetables, beans, and whole grains. Talk with your dietitian about how many servings of carbs you can eat at each meal. Eat 4-6 oz (112-168 g) of lean protein each day, such as lean meat, chicken, fish, eggs, or tofu. One ounce (oz) of lean protein is equal to: 1 oz (28 g) of meat, chicken, or fish. 1 egg.  cup (62 g) of tofu. Eat some foods each day that contain healthy fats, such as avocado, nuts, seeds, and fish. What foods should I eat? Fruits Berries. Apples. Oranges. Peaches. Apricots. Plums. Grapes. Mango. Papaya. Pomegranate. Kiwi. Cherries. Vegetables Lettuce. Spinach. Leafy greens, including kale, chard, collard greens, and mustard greens. Beets. Cauliflower. Cabbage. Broccoli. Carrots. Green beans. Tomatoes. Peppers. Onions. Cucumbers. Brussels sprouts. Grains Whole grains, such as whole-wheat or whole-grain bread, crackers, tortillas, cereal, and pasta. Unsweetened oatmeal. Quinoa. Brown or wild rice. Meats and other proteins Seafood. Poultry without skin. Lean cuts of poultry and beef. Tofu. Nuts. Seeds. Dairy Low-fat or fat-free dairy products such as milk, yogurt, and cheese. The items listed above may not be a complete list of foods and beverages you can eat.  Contact a dietitian for more information. What foods should I avoid? Fruits Fruits canned with syrup. Vegetables Canned vegetables. Frozen vegetables with butter or cream sauce. Grains Refined white flour and flour products such as bread, pasta, snack foods, and cereals. Avoid all processed foods. Meats and other proteins Fatty cuts of meat. Poultry with skin. Breaded or fried meats. Processed meat. Avoid saturated fats. Dairy Full-fat yogurt, cheese, or milk. Beverages Sweetened drinks, such as soda or iced tea. The items listed above may not be a complete  list of foods and beverages you should avoid. Contact a dietitian for more information. Questions to ask a health care provider Do I need to meet with a diabetes educator? Do I need to meet with a dietitian? What number can I call if I have questions? When are the best times to check my blood glucose? Where to find more information: American Diabetes Association: diabetes.org Academy of Nutrition and Dietetics: www.eatright.[employer] of Diabetes and Digestive and Kidney Diseases: [employer] Association of Diabetes Care and Education Specialists: www.diabeteseducator.org Summary It is important to have healthy eating habits because your blood sugar (glucose) levels are greatly affected by what you eat and drink. A healthy meal plan will help you control your blood glucose and maintain a healthy lifestyle. Your health care provider may recommend that you work with a dietitian to make a meal plan that is best for you. Keep in mind that carbohydrates (carbs) and alcohol have immediate effects on your blood glucose levels. It is important to count carbs and to use alcohol carefully. This information is not intended to replace advice given to you by your health care provider. Make sure you discuss any questions you have with your health care provider. Document Revised: 02/22/2019 Document Reviewed: 02/22/2019 Elsevier Patient Education  2021 [place], MD [facility]

## 2020-12-18 ENCOUNTER — Ambulatory Visit: Payer: Medicare Other | Admitting: Emergency Medicine

## 2020-12-20 ENCOUNTER — Other Ambulatory Visit: Payer: Self-pay | Admitting: Family Medicine

## 2020-12-20 DIAGNOSIS — E785 Hyperlipidemia, unspecified: Secondary | ICD-10-CM

## 2020-12-27 DIAGNOSIS — E291 Testicular hypofunction: Secondary | ICD-10-CM | POA: Diagnosis not present

## 2020-12-27 DIAGNOSIS — C61 Malignant neoplasm of prostate: Secondary | ICD-10-CM | POA: Diagnosis not present

## 2021-01-03 DIAGNOSIS — C61 Malignant neoplasm of prostate: Secondary | ICD-10-CM | POA: Diagnosis not present

## 2021-01-03 DIAGNOSIS — E291 Testicular hypofunction: Secondary | ICD-10-CM | POA: Diagnosis not present

## 2021-01-24 DIAGNOSIS — H6981 Other specified disorders of Eustachian tube, right ear: Secondary | ICD-10-CM | POA: Diagnosis not present

## 2021-01-24 DIAGNOSIS — H9312 Tinnitus, left ear: Secondary | ICD-10-CM | POA: Diagnosis not present

## 2021-01-24 DIAGNOSIS — H838X3 Other specified diseases of inner ear, bilateral: Secondary | ICD-10-CM | POA: Diagnosis not present

## 2021-01-24 DIAGNOSIS — H903 Sensorineural hearing loss, bilateral: Secondary | ICD-10-CM | POA: Diagnosis not present

## 2021-02-08 DIAGNOSIS — H401111 Primary open-angle glaucoma, right eye, mild stage: Secondary | ICD-10-CM | POA: Diagnosis not present

## 2021-02-08 DIAGNOSIS — H401122 Primary open-angle glaucoma, left eye, moderate stage: Secondary | ICD-10-CM | POA: Diagnosis not present

## 2021-02-13 DIAGNOSIS — Z23 Encounter for immunization: Secondary | ICD-10-CM | POA: Diagnosis not present

## 2021-03-01 DIAGNOSIS — H401122 Primary open-angle glaucoma, left eye, moderate stage: Secondary | ICD-10-CM | POA: Diagnosis not present

## 2021-03-01 DIAGNOSIS — H401111 Primary open-angle glaucoma, right eye, mild stage: Secondary | ICD-10-CM | POA: Diagnosis not present

## 2021-03-21 ENCOUNTER — Other Ambulatory Visit: Payer: Self-pay | Admitting: Emergency Medicine

## 2021-03-21 DIAGNOSIS — K219 Gastro-esophageal reflux disease without esophagitis: Secondary | ICD-10-CM

## 2021-05-21 ENCOUNTER — Ambulatory Visit: Payer: Medicare Other | Admitting: Internal Medicine

## 2021-05-28 ENCOUNTER — Other Ambulatory Visit: Payer: Self-pay

## 2021-05-28 ENCOUNTER — Encounter: Payer: Self-pay | Admitting: Emergency Medicine

## 2021-05-28 ENCOUNTER — Ambulatory Visit (INDEPENDENT_AMBULATORY_CARE_PROVIDER_SITE_OTHER): Payer: Medicare Other

## 2021-05-28 ENCOUNTER — Ambulatory Visit (INDEPENDENT_AMBULATORY_CARE_PROVIDER_SITE_OTHER): Payer: Medicare Other | Admitting: Emergency Medicine

## 2021-05-28 VITALS — BP 142/72 | HR 73 | Temp 98.2°F | Ht 71.0 in | Wt 202.0 lb

## 2021-05-28 DIAGNOSIS — J22 Unspecified acute lower respiratory infection: Secondary | ICD-10-CM | POA: Diagnosis not present

## 2021-05-28 DIAGNOSIS — R051 Acute cough: Secondary | ICD-10-CM

## 2021-05-28 DIAGNOSIS — R059 Cough, unspecified: Secondary | ICD-10-CM | POA: Diagnosis not present

## 2021-05-28 MED ORDER — BENZONATATE 200 MG PO CAPS: 200.0000 mg | ORAL_CAPSULE | Freq: Two times a day (BID) | ORAL | 0 refills | Status: DC | PRN

## 2021-05-28 MED ORDER — AZITHROMYCIN 250 MG PO TABS: ORAL_TABLET | ORAL | 0 refills | Status: DC

## 2021-05-28 MED ORDER — GUAIFENESIN-CODEINE 100-10 MG/5ML PO SYRP: 5.0000 mL | ORAL_SOLUTION | Freq: Three times a day (TID) | ORAL | 1 refills | Status: DC | PRN

## 2021-05-28 NOTE — Patient Instructions (Signed)

## 2021-05-28 NOTE — Progress Notes (Signed)
Douglas [name] 86 y.o.   Chief Complaint  Patient presents with   Cough    Chest and nasal congestion, x 2 weeks    HISTORY OF PRESENT ILLNESS: Acute problem visit today. This is a 86 y.o. male complaining of cough and chest congestion for the past 3 weeks. No other complaints or medical concerns.  Tested negative for COVID couple times.  HPI   Prior to Admission medications   Medication Sig Start Date End Date Taking? Authorizing Provider  alfuzosin (UROXATRAL) 10 MG 24 hr tablet  11/09/14  Yes [provider]  cetirizine (ZYRTEC) 10 MG tablet TAKE 1 TABLET BY MOUTH EVERY DAY 09/24/20  Yes Shankar Silber, Ines Bloomer, MD  Cholecalciferol (VITAMIN D) 2000 UNITS tablet Take 2,000 Units by mouth daily.   Yes [provider]  fluticasone (FLONASE) 50 MCG/ACT nasal spray PLACE 1 SPRAY INTO BOTH NOSTRILS DAILY. 09/17/20  Yes [name], MD  latanoprost (XALATAN) 0.005 % ophthalmic solution Place 1 drop into the left eye at bedtime. 07/14/19  Yes [provider]  Multiple Vitamin (MULTIVITAMIN) tablet Take 1 tablet by mouth daily.   Yes [provider]  pantoprazole (PROTONIX) 40 MG tablet TAKE 1 TABLET BY MOUTH EVERY DAY 03/21/21  Yes Catrice Zuleta, Ines Bloomer, MD  RESTASIS 0.05 % ophthalmic emulsion INSTILL 1 DROP INTO BOTH EYES TWICE A DAY 07/13/17  Yes [provider]  simvastatin (ZOCOR) 20 MG tablet TAKE 1 TABLET DAILY AT 6PM. 12/20/20  Yes [name], MD  Testosterone 30 MG/ACT SOLN Place 30 g onto the skin.   Yes [provider]    No Known Allergies  Patient Active Problem List   Diagnosis Date Noted   Ringing in right ear 12/12/2020   Ear problem, right 12/12/2020   Diet-controlled diabetes mellitus ([place]) 09/20/2020   History of prostate cancer 09/20/2020   Atherosclerosis of aorta ([place]) 09/20/2020   Headache 05/17/2018   Vitamin B12 deficiency 03/16/2017   B12 deficiency 12/16/2016   Bladder outlet obstruction  04/25/2015   Erectile dysfunction due to arterial insufficiency 04/25/2015   Osteoporosis 04/25/2015   Vitamin D deficiency 04/25/2015   Prostate cancer ([place]) 05/30/2011   Dyslipidemia 05/30/2011   Hypogonadism male 05/30/2011    Past Medical History:  Diagnosis Date   Adenomatous colon polyp 03/2001   Allergy    SEASONAL   Cancer ([place])    Cataract    Dizziness    GERD (gastroesophageal reflux disease)    Hearing loss    Hyperlipidemia    Low testosterone     Past Surgical History:  Procedure Laterality Date   APPENDECTOMY     COLONOSCOPY     EYE SURGERY     POLYPECTOMY     PROSTATE SURGERY     SPINE SURGERY      Social History   Socioeconomic History   Marital status: Married    Spouse name: Not on file   Number of children: 3   Years of education: 2 years GT   Highest education level: Not on file  Occupational History   Occupation: RETIRED  Tobacco Use   Smoking status: Never   Smokeless tobacco: Never  Vaping Use   Vaping Use: Never used  Substance and Sexual Activity   Alcohol use: No    Alcohol/week: 0.0 standard drinks   Drug use: No   Sexual activity: Yes  Other Topics Concern   Not on file  Social History Narrative   Married. Education: [employer].  Exercise: Yes.   Right-handed.   Drinks coffee daily.   Lives at home with his wife.   Social Determinants of Health   Financial Resource Strain: Not on file  Food Insecurity: Not on file  Transportation Needs: Not on file  Physical Activity: Not on file  Stress: Not on file  Social Connections: Not on file  Intimate Partner Violence: Not on file    Family History  Problem Relation Age of Onset   Cancer Father 17       pancreatic   Other Mother        age 39   Prostate cancer Brother    Colon cancer Neg Hx    Esophageal cancer Neg Hx    Liver cancer Neg Hx    Pancreatic cancer Neg Hx    Rectal cancer Neg Hx    Stomach cancer Neg Hx      Review of Systems  Constitutional: Negative.   Negative for chills and fever.  [name]:  Positive for congestion.   Respiratory:  Positive for cough. Negative for shortness of breath.   Cardiovascular: Negative.  Negative for chest pain and palpitations.  Gastrointestinal:  Negative for abdominal pain, nausea and vomiting.  Genitourinary: Negative.  Negative for dysuria and hematuria.  Skin: Negative.   Neurological: Negative.  Negative for dizziness and headaches.  All other systems reviewed and are negative.  Today's Vitals   05/28/21 0845  BP: (!) 142/72  Pulse: 73  Temp: 98.2 F (36.8 C)  TempSrc: Oral  SpO2: 93%  Weight: 202 lb (91.6 kg)  Height: 5\' 11"  (1.803 m)   Body mass index is 28.17 kg/m.  Physical Exam Constitutional:      Appearance: Normal appearance.  [name]:     Head: Normocephalic.  Eyes:     Extraocular Movements: Extraocular movements intact.     Pupils: Pupils are equal, round, and reactive to light.  Cardiovascular:     Rate and Rhythm: Normal rate and regular rhythm.     Pulses: Normal pulses.     Heart sounds: Normal heart sounds.  Pulmonary:     Effort: Pulmonary effort is normal.     Breath sounds: Normal breath sounds.  Musculoskeletal:     Cervical back: No tenderness.  Lymphadenopathy:     Cervical: No cervical adenopathy.  Skin:    General: Skin is warm and dry.  Neurological:     General: No focal deficit present.     Mental Status: He is alert and oriented to person, place, and time.  Psychiatric:        Mood and Affect: Mood normal.        Behavior: Behavior normal.   DG Chest 2 View  Result Date: 05/28/2021 CLINICAL DATA:  Cough for the past 3 weeks. EXAM: CHEST - 2 VIEW COMPARISON:  Chest x-ray dated February 29, 2020. FINDINGS: The heart size and mediastinal contours are within normal limits. Both lungs are clear. The visualized skeletal structures are unremarkable. IMPRESSION: No active cardiopulmonary disease. Electronically Signed   By: Titus Dubin M.D.   On: 05/28/2021  09:30     ASSESSMENT & PLAN: Clinically stable.  No red flag signs or symptoms. Stable vital signs.  Oxygenating well.  Normal chest x-ray.  No acute findings. May have secondary bacterial infection.  Will start Zithromax and cough medicine. ED precautions given. Advised to contact the office if no better or worse during the next several days.  Problem List Items Addressed This Visit  None Visit Diagnoses     Lower respiratory infection    -  Primary   Relevant Medications   azithromycin (ZITHROMAX) 250 MG tablet   Other Relevant Orders   DG Chest 2 View (Completed)   Acute cough       Relevant Medications   benzonatate (TESSALON) 200 MG capsule   guaiFENesin-codeine (ROBITUSSIN AC) 100-10 MG/5ML syrup   Other Relevant Orders   DG Chest 2 View (Completed)      Patient Instructions  Cough, Adult A cough helps to clear your throat and lungs. A cough may be a sign of an illness or another medical condition. An acute cough may only last 2-3 weeks, while a chronic cough may last 8 or more weeks. Many things can cause a cough. They include: Germs (viruses or bacteria) that attack the airway. Breathing in things that bother (irritate) your lungs. Allergies. Asthma. Mucus that runs down the back of your throat (postnasal drip). Smoking. Acid backing up from the stomach into the tube that moves food from the mouth to the stomach (gastroesophageal reflux). Some medicines. Lung problems. Other medical conditions, such as heart failure or a blood clot in the lung (pulmonary embolism). Follow these instructions at home: Medicines Take over-the-counter and prescription medicines only as told by your doctor. Talk with your doctor before you take medicines that stop a cough (cough suppressants). Lifestyle  Do not smoke, and try not to be around smoke. Do not use any products that contain nicotine or tobacco, such as cigarettes, e-cigarettes, and chewing tobacco. If you need help  quitting, ask your doctor. Drink enough fluid to keep your pee (urine) pale yellow. Avoid caffeine. Do not drink alcohol if your doctor tells you not to drink. General instructions  Watch for any changes in your cough. Tell your doctor about them. Always cover your mouth when you cough. Stay away from things that make you cough, such as perfume, candles, campfire smoke, or cleaning products. If the air is dry, use a cool mist vaporizer or humidifier in your home. If your cough is worse at night, try using extra pillows to raise your head up higher while you sleep. Rest as needed. Keep all follow-up visits as told by your doctor. This is important. Contact a doctor if: You have new symptoms. You cough up pus. Your cough does not get better after 2-3 weeks, or your cough gets worse. Cough medicine does not help your cough and you are not sleeping well. You have pain that gets worse or pain that is not helped with medicine. You have a fever. You are losing weight and you do not know why. You have night sweats. Get help right away if: You cough up blood. You have trouble breathing. Your heartbeat is very fast. These symptoms may be an emergency. Do not wait to see if the symptoms will go away. Get medical help right away. Call your local emergency services (911 in the [place] Do not drive yourself to the hospital. Summary A cough helps to clear your throat and lungs. Many things can cause a cough. Take over-the-counter and prescription medicines only as told by your doctor. Always cover your mouth when you cough. Contact a doctor if you have new symptoms or you have a cough that does not get better or gets worse. This information is not intended to replace advice given to you by your health care provider. Make sure you discuss any questions you have with your health care provider. Document  Revised: 05/06/2019 Document Reviewed: 04/05/2018 Elsevier Patient Education  2022 [place], MD [facility]

## 2021-06-04 ENCOUNTER — Other Ambulatory Visit: Payer: Self-pay | Admitting: Family Medicine

## 2021-06-04 ENCOUNTER — Other Ambulatory Visit: Payer: Self-pay | Admitting: Registered Nurse

## 2021-06-04 DIAGNOSIS — E785 Hyperlipidemia, unspecified: Secondary | ICD-10-CM

## 2021-06-04 DIAGNOSIS — R059 Cough, unspecified: Secondary | ICD-10-CM

## 2021-06-12 ENCOUNTER — Telehealth: Payer: Self-pay | Admitting: Emergency Medicine

## 2021-06-12 DIAGNOSIS — R0981 Nasal congestion: Secondary | ICD-10-CM

## 2021-06-12 DIAGNOSIS — R0982 Postnasal drip: Secondary | ICD-10-CM

## 2021-06-12 MED ORDER — FLUTICASONE PROPIONATE 50 MCG/ACT NA SUSP
1.0000 | Freq: Every day | NASAL | 1 refills | Status: DC
Start: 2021-06-12 — End: 2022-04-09

## 2021-06-12 NOTE — Telephone Encounter (Signed)
1.Medication Requested: fluticasone (FLONASE) 50 MCG/ACT nasal spray ? ?2. Pharmacy (Name, Street, [place]): [id number] [place], [place]? ?3. On Med List: Y ? ?4. Last Visit with PCP: 05-28-2021 ? ?5. Next visit date with PCP: 06-18-2021 ? ? ?Agent: Please be advised that RX refills may take up to 3 business days. We ask that you follow-up with your pharmacy.  ?

## 2021-06-18 ENCOUNTER — Encounter: Payer: Self-pay | Admitting: Emergency Medicine

## 2021-06-18 ENCOUNTER — Ambulatory Visit (INDEPENDENT_AMBULATORY_CARE_PROVIDER_SITE_OTHER): Payer: Medicare Other | Admitting: Emergency Medicine

## 2021-06-18 ENCOUNTER — Other Ambulatory Visit: Payer: Self-pay

## 2021-06-18 VITALS — BP 140/62 | HR 76 | Temp 98.4°F | Ht 71.0 in | Wt 209.1 lb

## 2021-06-18 DIAGNOSIS — R051 Acute cough: Secondary | ICD-10-CM | POA: Insufficient documentation

## 2021-06-18 DIAGNOSIS — J410 Simple chronic bronchitis: Secondary | ICD-10-CM | POA: Diagnosis not present

## 2021-06-18 DIAGNOSIS — R053 Chronic cough: Secondary | ICD-10-CM | POA: Insufficient documentation

## 2021-06-18 MED ORDER — BREZTRI AEROSPHERE 160-9-4.8 MCG/ACT IN AERO: 2.0000 | INHALATION_SPRAY | Freq: Two times a day (BID) | RESPIRATORY_TRACT | 11 refills | Status: DC

## 2021-06-18 MED ORDER — BENZONATATE 200 MG PO CAPS: 200.0000 mg | ORAL_CAPSULE | Freq: Two times a day (BID) | ORAL | 0 refills | Status: DC | PRN

## 2021-06-18 NOTE — Patient Instructions (Signed)

## 2021-06-18 NOTE — Progress Notes (Signed)
Douglas [name] 86 y.o.   Chief Complaint  Patient presents with   Cough    Lingering x 6 weeks taking OTC medicine not working    HISTORY OF PRESENT ILLNESS: This is a 86 y.o. male complaining of persistent cough for the past 7 to 8 weeks. Seen by me on 05/28/2021 for lower respiratory infection.  Started on azithromycin and cough medicine.  Feels better but still has some residual cough.  Non-smoker. Denies flulike symptoms.  No fever or chills.  Denies hemoptysis.  Denies chest pain or trouble breathing.   Cough Pertinent negatives include no chest pain, chills, fever, headaches, rash or sore throat.    Prior to Admission medications   Medication Sig Start Date End Date Taking? Authorizing Provider  albuterol (VENTOLIN HFA) 108 (90 Base) MCG/ACT inhaler TAKE 2 PUFFS BY MOUTH EVERY 6 HOURS AS NEEDED FOR WHEEZE OR SHORTNESS OF BREATH 06/04/21  Yes Janeece Agee, NP  alfuzosin (UROXATRAL) 10 MG 24 hr tablet  11/09/14  Yes [provider]  azithromycin (ZITHROMAX) 250 MG tablet Sig as indicated 05/28/21  Yes Caitrin Pendergraph, Eilleen Kempf, MD  benzonatate (TESSALON) 200 MG capsule Take 1 capsule (200 mg total) by mouth 2 (two) times daily as needed for cough. 05/28/21  Yes Georgina Quint, MD  cetirizine (ZYRTEC) 10 MG tablet TAKE 1 TABLET BY MOUTH EVERY DAY 09/24/20  Yes Treveon Bourcier, Eilleen Kempf, MD  Cholecalciferol (VITAMIN D) 2000 UNITS tablet Take 2,000 Units by mouth daily.   Yes [provider]  fluticasone (FLONASE) 50 MCG/ACT nasal spray Place 1 spray into both nostrils daily. 06/12/21  Yes Alexande Sheerin, Eilleen Kempf, MD  guaiFENesin-codeine [facility]) 100-10 MG/5ML syrup Take 5 mLs by mouth 3 (three) times daily as needed for cough. 05/28/21  Yes Alvie Fowles, Eilleen Kempf, MD  latanoprost (XALATAN) 0.005 % ophthalmic solution Place 1 drop into the left eye at bedtime. 07/14/19  Yes [provider]  Multiple Vitamin (MULTIVITAMIN) tablet Take 1 tablet by mouth daily.    Yes [provider]  pantoprazole (PROTONIX) 40 MG tablet TAKE 1 TABLET BY MOUTH EVERY DAY 03/21/21  Yes Jonisha Kindig, Eilleen Kempf, MD  RESTASIS 0.05 % ophthalmic emulsion INSTILL 1 DROP INTO BOTH EYES TWICE A DAY 07/13/17  Yes [provider]  simvastatin (ZOCOR) 20 MG tablet TAKE 1 TABLET DAILY AT 6PM. 06/04/21  Yes Medora Roorda, Eilleen Kempf, MD  Testosterone 30 MG/ACT SOLN Place 30 g onto the skin.   Yes [provider]    No Known Allergies  Patient Active Problem List   Diagnosis Date Noted   Ringing in right ear 12/12/2020   Ear problem, right 12/12/2020   Diet-controlled diabetes mellitus ([facility]) 09/20/2020   History of prostate cancer 09/20/2020   Atherosclerosis of aorta ([facility]) 09/20/2020   Headache 05/17/2018   Vitamin B12 deficiency 03/16/2017   B12 deficiency 12/16/2016   Bladder outlet obstruction 04/25/2015   Erectile dysfunction due to arterial insufficiency 04/25/2015   Osteoporosis 04/25/2015   Vitamin D deficiency 04/25/2015   Prostate cancer ([facility]) 05/30/2011   Dyslipidemia 05/30/2011   Hypogonadism male 05/30/2011    Past Medical History:  Diagnosis Date   Adenomatous colon polyp 03/2001   Allergy    SEASONAL   Cancer ([facility])    Cataract    Dizziness    GERD (gastroesophageal reflux disease)    Hearing loss    Hyperlipidemia    Low testosterone     Past Surgical History:  Procedure Laterality Date   APPENDECTOMY  COLONOSCOPY     EYE SURGERY     POLYPECTOMY     PROSTATE SURGERY     SPINE SURGERY      Social History   Socioeconomic History   Marital status: Married    Spouse name: Not on file   Number of children: 3   Years of education: 2 years GT   Highest education level: Not on file  Occupational History   Occupation: RETIRED  Tobacco Use   Smoking status: Never   Smokeless tobacco: Never  Vaping Use   Vaping Use: Never used  Substance and Sexual Activity   Alcohol use: No    Alcohol/week: 0.0 standard drinks    Drug use: No   Sexual activity: Yes  Other Topics Concern   Not on file  Social History Narrative   Married. Education: [employer]. Exercise: Yes.   Right-handed.   Drinks coffee daily.   Lives at home with his wife.   Social Determinants of Health   Financial Resource Strain: Not on file  Food Insecurity: Not on file  Transportation Needs: Not on file  Physical Activity: Not on file  Stress: Not on file  Social Connections: Not on file  Intimate Partner Violence: Not on file    Family History  Problem Relation Age of Onset   Cancer Father 52       pancreatic   Other Mother        age 61   Prostate cancer Brother    Colon cancer Neg Hx    Esophageal cancer Neg Hx    Liver cancer Neg Hx    Pancreatic cancer Neg Hx    Rectal cancer Neg Hx    Stomach cancer Neg Hx      Review of Systems  Constitutional: Negative.  Negative for chills and fever.  [name]: Negative.  Negative for congestion and sore throat.   Respiratory:  Positive for cough.   Cardiovascular: Negative.  Negative for chest pain and palpitations.  Gastrointestinal:  Negative for abdominal pain, nausea and vomiting.  Skin: Negative.  Negative for rash.  Neurological: Negative.  Negative for dizziness and headaches.  All other systems reviewed and are negative.  Today's Vitals   06/18/21 1340  BP: 140/62  Pulse: 76  Temp: 98.4 F (36.9 C)  TempSrc: Oral  SpO2: 99%  Weight: 209 lb 2 oz (94.9 kg)  Height: 5\' 11"  (1.803 m)   Body mass index is 29.17 kg/m.  Physical Exam Vitals reviewed.  Constitutional:      Appearance: Normal appearance.  [name]:     Head: Normocephalic.  Eyes:     Extraocular Movements: Extraocular movements intact.     Pupils: Pupils are equal, round, and reactive to light.  Cardiovascular:     Rate and Rhythm: Normal rate and regular rhythm.     Pulses: Normal pulses.     Heart sounds: Normal heart sounds.  Pulmonary:     Effort: Pulmonary effort is normal.     Breath  sounds: Wheezing (Intermittent and diffuse) and rhonchi present.  Abdominal:     Palpations: Abdomen is soft.     Tenderness: There is no abdominal tenderness.  Musculoskeletal:     Cervical back: No tenderness.  Lymphadenopathy:     Cervical: No cervical adenopathy.  Skin:    General: Skin is warm and dry.     Capillary Refill: Capillary refill takes less than 2 seconds.  Neurological:     General: No focal deficit present.  Mental Status: He is alert and oriented to person, place, and time.  Psychiatric:        Mood and Affect: Mood normal.        Behavior: Behavior normal.    ASSESSMENT & PLAN: Problem List Items Addressed This Visit       Respiratory   Simple chronic bronchitis (HCC)    No red flag signs or symptoms.  No signs of pneumonia. Persistent cough and mucus production.  Signs of airway inflammation on physical exam.  Sequela of bronchitis. May benefit from [place] twice a day for couple weeks.      Relevant Medications   Budeson-Glycopyrrol-Formoterol (BREZTRI AEROSPHERE) 160-9-4.8 MCG/ACT AERO     Other   Persistent cough - Primary    Continue over-the-counter Mucinex-DM along with Tessalon Perles 200 mg.      Other Visit Diagnoses     Acute cough       Relevant Medications   benzonatate (TESSALON) 200 MG capsule      Patient Instructions  Cough, Adult A cough helps to clear your throat and lungs. A cough may be a sign of an illness or another medical condition. An acute cough may only last 2-3 weeks, while a chronic cough may last 8 or more weeks. Many things can cause a cough. They include: Germs (viruses or bacteria) that attack the airway. Breathing in things that bother (irritate) your lungs. Allergies. Asthma. Mucus that runs down the back of your throat (postnasal drip). Smoking. Acid backing up from the stomach into the tube that moves food from the mouth to the stomach (gastroesophageal reflux). Some medicines. Lung  problems. Other medical conditions, such as heart failure or a blood clot in the lung (pulmonary embolism). Follow these instructions at home: Medicines Take over-the-counter and prescription medicines only as told by your doctor. Talk with your doctor before you take medicines that stop a cough (cough suppressants). Lifestyle  Do not smoke, and try not to be around smoke. Do not use any products that contain nicotine or tobacco, such as cigarettes, e-cigarettes, and chewing tobacco. If you need help quitting, ask your doctor. Drink enough fluid to keep your pee (urine) pale yellow. Avoid caffeine. Do not drink alcohol if your doctor tells you not to drink. General instructions  Watch for any changes in your cough. Tell your doctor about them. Always cover your mouth when you cough. Stay away from things that make you cough, such as perfume, candles, campfire smoke, or cleaning products. If the air is dry, use a cool mist vaporizer or humidifier in your home. If your cough is worse at night, try using extra pillows to raise your head up higher while you sleep. Rest as needed. Keep all follow-up visits as told by your doctor. This is important. Contact a doctor if: You have new symptoms. You cough up pus. Your cough does not get better after 2-3 weeks, or your cough gets worse. Cough medicine does not help your cough and you are not sleeping well. You have pain that gets worse or pain that is not helped with medicine. You have a fever. You are losing weight and you do not know why. You have night sweats. Get help right away if: You cough up blood. You have trouble breathing. Your heartbeat is very fast. These symptoms may be an emergency. Do not wait to see if the symptoms will go away. Get medical help right away. Call your local emergency services (911 in the [place] Do  not drive yourself to the hospital. Summary A cough helps to clear your throat and lungs. Many things can cause  a cough. Take over-the-counter and prescription medicines only as told by your doctor. Always cover your mouth when you cough. Contact a doctor if you have new symptoms or you have a cough that does not get better or gets worse. This information is not intended to replace advice given to you by your health care provider. Make sure you discuss any questions you have with your health care provider. Document Revised: 05/06/2019 Document Reviewed: 04/05/2018 Elsevier Patient Education  2022 Elsevier Inc.     Edwina Barth, MD [facility]

## 2021-06-18 NOTE — Assessment & Plan Note (Signed)
Continue over-the-counter Mucinex-DM along with Tessalon Perles 200 mg. ?

## 2021-06-18 NOTE — Assessment & Plan Note (Signed)
No red flag signs or symptoms.  No signs of pneumonia. ?Persistent cough and mucus production.  Signs of airway inflammation on physical exam.  Sequela of bronchitis. ?May benefit from [place] twice a day for couple weeks. ?

## 2021-06-22 ENCOUNTER — Other Ambulatory Visit: Payer: Self-pay | Admitting: Emergency Medicine

## 2021-06-22 DIAGNOSIS — K219 Gastro-esophageal reflux disease without esophagitis: Secondary | ICD-10-CM

## 2021-06-27 DIAGNOSIS — E291 Testicular hypofunction: Secondary | ICD-10-CM | POA: Diagnosis not present

## 2021-06-27 DIAGNOSIS — C61 Malignant neoplasm of prostate: Secondary | ICD-10-CM | POA: Diagnosis not present

## 2021-07-04 DIAGNOSIS — C61 Malignant neoplasm of prostate: Secondary | ICD-10-CM | POA: Diagnosis not present

## 2021-07-04 DIAGNOSIS — E291 Testicular hypofunction: Secondary | ICD-10-CM | POA: Diagnosis not present

## 2021-07-04 DIAGNOSIS — N5201 Erectile dysfunction due to arterial insufficiency: Secondary | ICD-10-CM | POA: Diagnosis not present

## 2021-07-05 DIAGNOSIS — H401111 Primary open-angle glaucoma, right eye, mild stage: Secondary | ICD-10-CM | POA: Diagnosis not present

## 2021-07-05 DIAGNOSIS — H401122 Primary open-angle glaucoma, left eye, moderate stage: Secondary | ICD-10-CM | POA: Diagnosis not present

## 2021-07-12 ENCOUNTER — Ambulatory Visit (INDEPENDENT_AMBULATORY_CARE_PROVIDER_SITE_OTHER): Payer: Medicare Other

## 2021-07-12 DIAGNOSIS — Z0000 Encounter for general adult medical examination without abnormal findings: Secondary | ICD-10-CM

## 2021-07-12 NOTE — Progress Notes (Signed)
? ?Subjective:  ? FUAD FORGET is a 86 y.o. male who presents for an Subsequent  Medicare Annual Wellness Visit. ? ?Review of Systems    ? ?Cardiac Risk Factors include: advanced age (>15mn, >>79women);male gender;dyslipidemia ? ?   ?Objective:  ?  ?Today's Vitals  ? ?There is no height or weight on file to calculate BMI. ? ? ?  07/12/2021  ?  1:38 PM 03/12/2020  ? 11:04 AM 01/04/2019  ? 11:12 AM 04/30/2017  ?  8:09 AM 11/14/2016  ? 10:41 AM 07/20/2015  ?  9:28 AM 04/30/2015  ?  2:12 PM  ?Advanced Directives  ?Does Patient Have a Medical Advance Directive? Yes Yes Yes Yes No No Yes  ?Type of AParamedicof [place] will Healthcare Power of [place] will   Living will  ?Does patient want to make changes to medical advance directive?     No - Patient declined  No - Patient declined  ?Copy of [place] Chart? No - copy requested No - copy requested     No - copy requested  ?Would patient like information on creating a medical advance directive?     No - Patient declined No - patient declined information   ? ? ?Current Medications (verified) ?Outpatient Encounter Medications as of 07/12/2021  ?Medication Sig  ? albuterol (VENTOLIN HFA) 108 (90 Base) MCG/ACT inhaler TAKE 2 PUFFS BY MOUTH EVERY 6 HOURS AS NEEDED FOR WHEEZE OR SHORTNESS OF BREATH  ? benzonatate (TESSALON) 200 MG capsule Take 1 capsule (200 mg total) by mouth 2 (two) times daily as needed for cough.  ? Budeson-Glycopyrrol-Formoterol (BREZTRI AEROSPHERE) 160-9-4.8 MCG/ACT AERO Inhale 2 puffs into the lungs 2 (two) times daily.  ? fluticasone (FLONASE) 50 MCG/ACT nasal spray Place 1 spray into both nostrils daily.  ? guaiFENesin-codeine (ROBITUSSIN AC) 100-10 MG/5ML syrup Take 5 mLs by mouth 3 (three) times daily as needed for cough.  ? latanoprost (XALATAN) 0.005 % ophthalmic solution Place 1 drop into the left eye at bedtime.  ? Multiple Vitamin  (MULTIVITAMIN) tablet Take 1 tablet by mouth daily.  ? RESTASIS 0.05 % ophthalmic emulsion INSTILL 1 DROP INTO BOTH EYES TWICE A DAY  ? simvastatin (ZOCOR) 20 MG tablet TAKE 1 TABLET DAILY AT 6PM.  ? Testosterone 30 MG/ACT SOLN Place 30 g onto the skin.  ? alfuzosin (UROXATRAL) 10 MG 24 hr tablet  (Patient not taking: Reported on 07/12/2021)  ? azithromycin (ZITHROMAX) 250 MG tablet Sig as indicated (Patient not taking: Reported on 07/12/2021)  ? cetirizine (ZYRTEC) 10 MG tablet TAKE 1 TABLET BY MOUTH EVERY DAY (Patient not taking: Reported on 07/12/2021)  ? Cholecalciferol (VITAMIN D) 2000 UNITS tablet Take 2,000 Units by mouth daily. (Patient not taking: Reported on 07/12/2021)  ? pantoprazole (PROTONIX) 40 MG tablet TAKE 1 TABLET BY MOUTH EVERY DAY (Patient not taking: Reported on 07/12/2021)  ? ?No facility-administered encounter medications on file as of 07/12/2021.  ? ? ?Allergies (verified) ?Patient has no known allergies.  ? ?History: ?Past Medical History:  ?Diagnosis Date  ? Adenomatous colon polyp 03/2001  ? Allergy   ? SEASONAL  ? Cancer ([facility]   ? Cataract   ? Dizziness   ? GERD (gastroesophageal reflux disease)   ? Hearing loss   ? Hyperlipidemia   ? Low testosterone   ? ?Past Surgical History:  ?Procedure Laterality Date  ? APPENDECTOMY    ? COLONOSCOPY    ?  EYE SURGERY    ? POLYPECTOMY    ? PROSTATE SURGERY    ? SPINE SURGERY    ? ?Family History  ?Problem Relation Age of Onset  ? Cancer Father 63  ?     pancreatic  ? Other Mother   ?     age 5  ? Prostate cancer Brother   ? Colon cancer Neg Hx   ? Esophageal cancer Neg Hx   ? Liver cancer Neg Hx   ? Pancreatic cancer Neg Hx   ? Rectal cancer Neg Hx   ? Stomach cancer Neg Hx   ? ?Social History  ? ?Socioeconomic History  ? Marital status: Married  ?  Spouse name: Not on file  ? Number of children: 3  ? Years of education: 2 years [employer]  ? Highest education level: Not on file  ?Occupational History  ? Occupation: RETIRED  ?Tobacco Use  ? Smoking status: Never   ? Smokeless tobacco: Never  ?Vaping Use  ? Vaping Use: Never used  ?Substance and Sexual Activity  ? Alcohol use: No  ?  Alcohol/week: 0.0 standard drinks  ? Drug use: No  ? Sexual activity: Yes  ?Other Topics Concern  ? Not on file  ?Social History Narrative  ? Married. Education: [employer]. Exercise: Yes.  ? Right-handed.  ? Drinks coffee daily.  ? Lives at home with his wife.  ? ?Social Determinants of Health  ? ?Financial Resource Strain: Low Risk   ? Difficulty of Paying Living Expenses: Not hard at all  ?Food Insecurity: No Food Insecurity  ? Worried About Charity fundraiser in the Last Year: Never true  ? Ran Out of Food in the Last Year: Never true  ?Transportation Needs: No Transportation Needs  ? Lack of Transportation (Medical): No  ? Lack of Transportation (Non-Medical): No  ?Physical Activity: Sufficiently Active  ? Days of Exercise per Week: 3 days  ? Minutes of Exercise per Session: 50 min  ?Stress: No Stress Concern Present  ? Feeling of Stress : Not at all  ?Social Connections: Socially Integrated  ? Frequency of Communication with Friends and Family: Twice a week  ? Frequency of Social Gatherings with Friends and Family: Twice a week  ? Attends Religious Services: 1 to 4 times per year  ? Active Member of Clubs or Organizations: Yes  ? Attends Archivist Meetings: 1 to 4 times per year  ? Marital Status: Married  ? ? ?Tobacco Counseling ?Counseling given: Not Answered ? ? ?Clinical Intake: ? ?Pre-visit preparation completed: Yes ? ?Pain : No/denies pain ? ?  ? ?Nutritional Risks: None ?Diabetes: No ? ?How often do you need to have someone help you when you read instructions, pamphlets, or other written materials from your doctor or pharmacy?: 1 - Never ?What is the last grade level you completed in school?: trade school ? ?Diabetic?no  ? ?Interpreter Needed?: No ? ?Information entered by :: [email or web address] ? ? ?Activities of Daily Living ? ?  07/12/2021  ?  1:40 PM  ?In your present state of  health, do you have any difficulty performing the following activities:  ?Hearing? 0  ?Vision? 0  ?Difficulty concentrating or making decisions? 0  ?Walking or climbing stairs? 0  ?Dressing or bathing? 0  ?Doing errands, shopping? 0  ?Preparing Food and eating ? N  ?Using the Toilet? N  ?In the past six months, have you accidently leaked urine? N  ?Do you have problems with loss  of bowel control? N  ?Managing your Medications? N  ?Managing your Finances? N  ?Housekeeping or managing your Housekeeping? N  ? ? ?Patient Care Team: ?[name], MD as PCP - General (Internal Medicine) ?Monna Fam, MD as Consulting Physician (Ophthalmology) ?Carolan Clines, MD (Inactive) as Consulting Physician (Urology) ?Jodi Marble, MD as Consulting Physician (Otolaryngology) ? ?Indicate any recent Medical Services you may have received from other than [facility] providers in the past year (date may be approximate). ? ?   ?Assessment:  ? This is a routine wellness examination for Constantino. ? ?Hearing/Vision screen ?Vision Screening - Comments:: Annual eye exams wear glasses  ? ?Dietary issues and exercise activities discussed: ?Current Exercise Habits: Home exercise routine, Type of exercise: walking, Time (Minutes): 45, Frequency (Times/Week): 3, Weekly Exercise (Minutes/Week): 135, Intensity: Mild, Exercise limited by: None identified ? ? Goals Addressed   ?None ?  ? ?Depression Screen ? ?  07/12/2021  ?  1:38 PM 07/12/2021  ?  1:37 PM 06/18/2021  ?  1:41 PM 05/28/2021  ? 11:58 AM 09/20/2020  ? 11:02 AM 04/04/2020  ?  9:44 AM 03/12/2020  ? 11:03 AM  ?PHQ 2/9 Scores  ?PHQ - 2 Score 0 0 0 0 0 0 0  ?  ?Fall Risk ? ?  07/12/2021  ?  1:39 PM 06/18/2021  ?  1:41 PM 05/28/2021  ? 11:58 AM 05/28/2021  ? 11:57 AM 09/20/2020  ? 11:02 AM  ?Fall Risk   ?Falls in the past year? 0 0 0  1  ?Number falls in past yr: 0  0 0 0  ?Injury with Fall? 0  0 0 0  ?Follow up Falls evaluation completed    Falls evaluation completed  ? ? ?FALL RISK PREVENTION  PERTAINING TO THE HOME: ? ?Any stairs in or around the home? Yes  ?If so, are there any without handrails? No  ?Home free of loose throw rugs in walkways, pet beds, electrical cords, etc? Yes  ?Hulan Amato

## 2021-07-12 NOTE — Patient Instructions (Signed)
Douglas [name] , ?Thank you for taking time to come for your Medicare Wellness Visit. I appreciate your ongoing commitment to your health goals. Please review the following plan we discussed and let me know if I can assist you in the future.  ? ?Screening recommendations/referrals: ?Colonoscopy: no longer required  ?Recommended yearly ophthalmology/optometry visit for glaucoma screening and checkup ?Recommended yearly dental visit for hygiene and checkup ? ?Vaccinations: ?Influenza vaccine: completed  ?Pneumococcal vaccine: completed  ?Tdap vaccine: due  ?Shingles vaccine: will consider    ? ?Advanced directives: yes  ? ?Conditions/risks identified: none  ? ?Next appointment: none  ? ?Preventive Care 29 Years and Older, Male ?Preventive care refers to lifestyle choices and visits with your health care provider that can promote health and wellness. ?What does preventive care include? ?A yearly physical exam. This is also called an annual well check. ?Dental exams once or twice a year. ?Routine eye exams. Ask your health care provider how often you should have your eyes checked. ?Personal lifestyle choices, including: ?Daily care of your teeth and gums. ?Regular physical activity. ?Eating a healthy diet. ?Avoiding tobacco and drug use. ?Limiting alcohol use. ?Practicing safe sex. ?Taking low doses of aspirin every day. ?Taking vitamin and mineral supplements as recommended by your health care provider. ?What happens during an annual well check? ?The services and screenings done by your health care provider during your annual well check will depend on your age, overall health, lifestyle risk factors, and family history of disease. ?Counseling  ?Your health care provider may ask you questions about your: ?Alcohol use. ?Tobacco use. ?Drug use. ?Emotional well-being. ?Home and relationship well-being. ?Sexual activity. ?Eating habits. ?History of falls. ?Memory and ability to understand (cognition). ?Work and work  Statistician. ?Screening  ?You may have the following tests or measurements: ?Height, weight, and BMI. ?Blood pressure. ?Lipid and cholesterol levels. These may be checked every 5 years, or more frequently if you are over 11 years old. ?Skin check. ?Lung cancer screening. You may have this screening every year starting at age 54 if you have a 30-pack-year history of smoking and currently smoke or have quit within the past 15 years. ?Fecal occult blood test (FOBT) of the stool. You may have this test every year starting at age 22. ?Flexible sigmoidoscopy or colonoscopy. You may have a sigmoidoscopy every 5 years or a colonoscopy every 10 years starting at age 69. ?Prostate cancer screening. Recommendations will vary depending on your family history and other risks. ?Hepatitis C blood test. ?Hepatitis B blood test. ?Sexually transmitted disease (STD) testing. ?Diabetes screening. This is done by checking your blood sugar (glucose) after you have not eaten for a while (fasting). You may have this done every 1-3 years. ?Abdominal aortic aneurysm (AAA) screening. You may need this if you are a current or former smoker. ?Osteoporosis. You may be screened starting at age 27 if you are at high risk. ?Talk with your health care provider about your test results, treatment options, and if necessary, the need for more tests. ?Vaccines  ?Your health care provider may recommend certain vaccines, such as: ?Influenza vaccine. This is recommended every year. ?Tetanus, diphtheria, and acellular pertussis (Tdap, Td) vaccine. You may need a Td booster every 10 years. ?Zoster vaccine. You may need this after age 42. ?Pneumococcal 13-valent conjugate (PCV13) vaccine. One dose is recommended after age 78. ?Pneumococcal polysaccharide (PPSV23) vaccine. One dose is recommended after age 74. ?Talk to your health care provider about which screenings and vaccines you need  and how often you need them. ?This information is not intended to replace  advice given to you by your health care provider. Make sure you discuss any questions you have with your health care provider. ?Document Released: 04/13/2015 Document Revised: 12/05/2015 Document Reviewed: 01/16/2015 ?Elsevier Interactive Patient Education ? 2017 [place]. ? ?Fall Prevention in the Home ?Falls can cause injuries. They can happen to people of all ages. There are many things you can do to make your home safe and to help prevent falls. ?What can I do on the outside of my home? ?Regularly fix the edges of walkways and driveways and fix any cracks. ?Remove anything that might make you trip as you walk through a door, such as a raised step or threshold. ?Trim any bushes or trees on the path to your home. ?Use bright outdoor lighting. ?Clear any walking paths of anything that might make someone trip, such as rocks or tools. ?Regularly check to see if handrails are loose or broken. Make sure that both sides of any steps have handrails. ?Any raised decks and porches should have guardrails on the edges. ?Have any leaves, snow, or ice cleared regularly. ?Use sand or salt on walking paths during winter. ?Clean up any spills in your garage right away. This includes oil or grease spills. ?What can I do in the bathroom? ?Use night lights. ?Install grab bars by the toilet and in the tub and shower. Do not use towel bars as grab bars. ?Use non-skid mats or decals in the tub or shower. ?If you need to sit down in the shower, use a plastic, non-slip stool. ?Keep the floor dry. Clean up any water that spills on the floor as soon as it happens. ?Remove soap buildup in the tub or shower regularly. ?Attach bath mats securely with double-sided non-slip rug tape. ?Do not have throw rugs and other things on the floor that can make you trip. ?What can I do in the bedroom? ?Use night lights. ?Make sure that you have a light by your bed that is easy to reach. ?Do not use any sheets or blankets that are too big for your bed.  They should not hang down onto the floor. ?Have a firm chair that has side arms. You can use this for support while you get dressed. ?Do not have throw rugs and other things on the floor that can make you trip. ?What can I do in the kitchen? ?Clean up any spills right away. ?Avoid walking on wet floors. ?Keep items that you use a lot in easy-to-reach places. ?If you need to reach something above you, use a strong step stool that has a grab bar. ?Keep electrical cords out of the way. ?Do not use floor polish or wax that makes floors slippery. If you must use wax, use non-skid floor wax. ?Do not have throw rugs and other things on the floor that can make you trip. ?What can I do with my stairs? ?Do not leave any items on the stairs. ?Make sure that there are handrails on both sides of the stairs and use them. Fix handrails that are broken or loose. Make sure that handrails are as long as the stairways. ?Check any carpeting to make sure that it is firmly attached to the stairs. Fix any carpet that is loose or worn. ?Avoid having throw rugs at the top or bottom of the stairs. If you do have throw rugs, attach them to the floor with carpet tape. ?Make sure that you  have a light switch at the top of the stairs and the bottom of the stairs. If you do not have them, ask someone to add them for you. ?What else can I do to help prevent falls? ?Wear shoes that: ?Do not have high heels. ?Have rubber bottoms. ?Are comfortable and fit you well. ?Are closed at the toe. Do not wear sandals. ?If you use a stepladder: ?Make sure that it is fully opened. Do not climb a closed stepladder. ?Make sure that both sides of the stepladder are locked into place. ?Ask someone to hold it for you, if possible. ?Clearly mark and make sure that you can see: ?Any grab bars or handrails. ?First and last steps. ?Where the edge of each step is. ?Use tools that help you move around (mobility aids) if they are needed. These  include: ?Canes. ?Walkers. ?Scooters. ?Crutches. ?Turn on the lights when you go into a dark area. Replace any light bulbs as soon as they burn out. ?Set up your furniture so you have a clear path. Avoid moving your furniture around. ?If

## 2021-08-02 DIAGNOSIS — H401122 Primary open-angle glaucoma, left eye, moderate stage: Secondary | ICD-10-CM | POA: Diagnosis not present

## 2021-08-02 DIAGNOSIS — H401111 Primary open-angle glaucoma, right eye, mild stage: Secondary | ICD-10-CM | POA: Diagnosis not present

## 2021-08-09 ENCOUNTER — Encounter: Payer: Self-pay | Admitting: Internal Medicine

## 2021-08-09 ENCOUNTER — Ambulatory Visit (INDEPENDENT_AMBULATORY_CARE_PROVIDER_SITE_OTHER): Payer: Medicare Other | Admitting: Internal Medicine

## 2021-08-09 VITALS — BP 138/68 | HR 62 | Temp 98.3°F | Ht 71.0 in | Wt 207.0 lb

## 2021-08-09 DIAGNOSIS — E538 Deficiency of other specified B group vitamins: Secondary | ICD-10-CM | POA: Diagnosis not present

## 2021-08-09 DIAGNOSIS — E119 Type 2 diabetes mellitus without complications: Secondary | ICD-10-CM

## 2021-08-09 DIAGNOSIS — R600 Localized edema: Secondary | ICD-10-CM

## 2021-08-09 DIAGNOSIS — E559 Vitamin D deficiency, unspecified: Secondary | ICD-10-CM

## 2021-08-09 LAB — URINALYSIS, ROUTINE W REFLEX MICROSCOPIC
Bilirubin Urine: NEGATIVE
Hgb urine dipstick: NEGATIVE
Ketones, ur: NEGATIVE
Leukocytes,Ua: NEGATIVE
Nitrite: NEGATIVE
RBC / HPF: NONE SEEN (ref 0–?)
Specific Gravity, Urine: 1.015 (ref 1.000–1.030)
Total Protein, Urine: NEGATIVE
Urine Glucose: NEGATIVE
Urobilinogen, UA: 1 (ref 0.0–1.0)
pH: 7 (ref 5.0–8.0)

## 2021-08-09 LAB — CBC WITH DIFFERENTIAL/PLATELET
Basophils Absolute: 0 10*3/uL (ref 0.0–0.1)
Basophils Relative: 0.5 % (ref 0.0–3.0)
Eosinophils Absolute: 0.1 10*3/uL (ref 0.0–0.7)
Eosinophils Relative: 1.1 % (ref 0.0–5.0)
HCT: 37.6 % — ABNORMAL LOW (ref 39.0–52.0)
Hemoglobin: 12.5 g/dL — ABNORMAL LOW (ref 13.0–17.0)
Lymphocytes Relative: 41.7 % (ref 12.0–46.0)
Lymphs Abs: 2.2 10*3/uL (ref 0.7–4.0)
MCHC: 33.4 g/dL (ref 30.0–36.0)
MCV: 98.1 fl (ref 78.0–100.0)
Monocytes Absolute: 0.6 10*3/uL (ref 0.1–1.0)
Monocytes Relative: 10.8 % (ref 3.0–12.0)
Neutro Abs: 2.5 10*3/uL (ref 1.4–7.7)
Neutrophils Relative %: 45.9 % (ref 43.0–77.0)
Platelets: 157 10*3/uL (ref 150.0–400.0)
RBC: 3.83 Mil/uL — ABNORMAL LOW (ref 4.22–5.81)
RDW: 13.4 % (ref 11.5–15.5)
WBC: 5.4 10*3/uL (ref 4.0–10.5)

## 2021-08-09 LAB — BASIC METABOLIC PANEL
BUN: 9 mg/dL (ref 6–23)
CO2: 29 mEq/L (ref 19–32)
Calcium: 10 mg/dL (ref 8.4–10.5)
Chloride: 101 mEq/L (ref 96–112)
Creatinine, Ser: 1.1 mg/dL (ref 0.40–1.50)
GFR: 61.78 mL/min (ref >60.00)
Glucose, Bld: 177 mg/dL — ABNORMAL HIGH (ref 70–99)
Potassium: 4.2 mEq/L (ref 3.5–5.1)
Sodium: 139 mEq/L (ref 135–145)

## 2021-08-09 LAB — HEPATIC FUNCTION PANEL
ALT: 20 U/L (ref 0–53)
AST: 17 U/L (ref 0–37)
Albumin: 4.3 g/dL (ref 3.5–5.2)
Alkaline Phosphatase: 50 U/L (ref 39–117)
Bilirubin, Direct: 0.1 mg/dL (ref 0.0–0.3)
Total Bilirubin: 0.6 mg/dL (ref 0.2–1.2)
Total Protein: 7.7 g/dL (ref 6.0–8.3)

## 2021-08-09 LAB — HEMOGLOBIN A1C: Hgb A1c MFr Bld: 7.3 % — ABNORMAL HIGH (ref 4.6–6.5)

## 2021-08-09 LAB — TSH: TSH: 1.33 u[IU]/mL (ref 0.35–5.50)

## 2021-08-09 LAB — VITAMIN D 25 HYDROXY (VIT D DEFICIENCY, FRACTURES): VITD: 70.18 ng/mL (ref 30.00–100.00)

## 2021-08-09 LAB — VITAMIN B12: Vitamin B-12: 1097 pg/mL — ABNORMAL HIGH (ref 211–911)

## 2021-08-09 LAB — LIPID PANEL
Cholesterol: 110 mg/dL (ref 0–200)
HDL: 34.4 mg/dL — ABNORMAL LOW (ref >39.00)
LDL Cholesterol: 51 mg/dL (ref 0–99)
NonHDL: 75.82
Total CHOL/HDL Ratio: 3
Triglycerides: 126 mg/dL (ref 0.0–149.0)
VLDL: 25.2 mg/dL (ref 0.0–40.0)

## 2021-08-09 LAB — BRAIN NATRIURETIC PEPTIDE: Pro B Natriuretic peptide (BNP): 36 pg/mL (ref 0.0–100.0)

## 2021-08-09 MED ORDER — HYDROCHLOROTHIAZIDE 12.5 MG PO CAPS
12.5000 mg | ORAL_CAPSULE | Freq: Every day | ORAL | 3 refills | Status: DC
Start: 2021-08-09 — End: 2022-02-05

## 2021-08-09 NOTE — Patient Instructions (Signed)
Please take all new medication as prescribed - the mild fluid pill ? ?Please also try to elevate the legs if you are sitting, low salt diet, weight control, and compression stocking use during the daytime ? ?Please continue all other medications as before, and refills have been done if requested. ? ?Please have the pharmacy call with any other refills you may need. ? ?Please keep your appointments with your specialists as you may have planned ? ?Please go to the LAB at the blood drawing area for the tests to be done ? ?You will be contacted by phone if any changes need to be made immediately.  Otherwise, you will receive a letter about your results with an explanation, but please check with MyChart first. ? ?Please remember to sign up for MyChart if you have not done so, as this will be important to you in the future with finding out test results, communicating by private email, and scheduling acute appointments online when needed. ? ? ?

## 2021-08-09 NOTE — Progress Notes (Signed)
Patient ID: Douglas [name], male   DOB: 10/26/1935, 86 y.o.   MRN: [id number] ? ? ? ?    Chief Complaint: follow up pedal edema ? ?     HPI:  Douglas [name] is a 86 y.o. male here with c/o 3 wks onset mild worsening pedal edema left > right for unclear reason.  Pt denies chest pain, increased sob or doe, wheezing, orthopnea, PND, increased LE swelling, palpitations, dizziness or syncope.  Does tend to sit most days with legs dependent, maybe more lately.  No prior hx of same per pt.  Not taking diuretic.  Trying to follow lower salt diet.  Not really working on leg elevation or compression stockings.   Pt denies polydipsia, polyuria, or new focal neuro s/s.    Pt denies fever, wt loss, night sweats, loss of appetite, or other constitutional symptoms  Taking B12 ?      ?Wt Readings from Last 3 Encounters:  ?08/09/21 207 lb (93.9 kg)  ?06/18/21 209 lb 2 oz (94.9 kg)  ?05/28/21 202 lb (91.6 kg)  ? ?BP Readings from Last 3 Encounters:  ?08/09/21 138/68  ?06/18/21 140/62  ?05/28/21 (!) 142/72  ? ?      ?Past Medical History:  ?Diagnosis Date  ? Adenomatous colon polyp 03/2001  ? Allergy   ? SEASONAL  ? Cancer [facility])   ? Cataract   ? Dizziness   ? GERD (gastroesophageal reflux disease)   ? Hearing loss   ? Hyperlipidemia   ? Low testosterone   ? ?Past Surgical History:  ?Procedure Laterality Date  ? APPENDECTOMY    ? COLONOSCOPY    ? EYE SURGERY    ? POLYPECTOMY    ? PROSTATE SURGERY    ? SPINE SURGERY    ? ? reports that he has never smoked. He has never used smokeless tobacco. He reports that he does not drink alcohol and does not use drugs. ?family history includes Cancer (age of onset: 38) in his father; Other in his mother; Prostate cancer in his brother. ?No Known Allergies ?Current Outpatient Medications on File Prior to Visit  ?Medication Sig Dispense Refill  ? albuterol (VENTOLIN HFA) 108 (90 Base) MCG/ACT inhaler TAKE 2 PUFFS BY MOUTH EVERY 6 HOURS AS NEEDED FOR WHEEZE OR SHORTNESS OF BREATH 8.5 each 3  ? alfuzosin  (UROXATRAL) 10 MG 24 hr tablet     ? cetirizine (ZYRTEC) 10 MG tablet TAKE 1 TABLET BY MOUTH EVERY DAY 90 tablet 3  ? Cholecalciferol (VITAMIN D) 2000 UNITS tablet Take 2,000 Units by mouth daily.    ? fluticasone (FLONASE) 50 MCG/ACT nasal spray Place 1 spray into both nostrils daily. 48 mL 1  ? latanoprost (XALATAN) 0.005 % ophthalmic solution Place 1 drop into the left eye at bedtime.    ? Multiple Vitamin (MULTIVITAMIN) tablet Take 1 tablet by mouth daily.    ? pantoprazole (PROTONIX) 40 MG tablet TAKE 1 TABLET BY MOUTH EVERY DAY 90 tablet 1  ? RESTASIS 0.05 % ophthalmic emulsion INSTILL 1 DROP INTO BOTH EYES TWICE A DAY  3  ? simvastatin (ZOCOR) 20 MG tablet TAKE 1 TABLET DAILY AT 6PM. 90 tablet 1  ? Testosterone 30 MG/ACT SOLN Place 30 g onto the skin.    ? Budeson-Glycopyrrol-Formoterol (BREZTRI AEROSPHERE) 160-9-4.8 MCG/ACT AERO Inhale 2 puffs into the lungs 2 (two) times daily. (Patient not taking: Reported on 08/09/2021) 10.7 g 11  ? guaiFENesin-codeine (ROBITUSSIN AC) 100-10 MG/5ML syrup Take 5 mLs by  mouth 3 (three) times daily as needed for cough. (Patient not taking: Reported on 08/09/2021) 120 mL 1  ? ?No current facility-administered medications on file prior to visit.  ? ?     ROS:  All others reviewed and negative. ? ?Objective  ? ?     PE:  BP 138/68 (BP Location: Right Arm, Patient Position: Sitting, Cuff Size: Large)   Pulse 62   Temp 98.3 ?F (36.8 ?C) (Oral)   Ht '5\' 11"'$  (1.803 m)   Wt 207 lb (93.9 kg)   SpO2 98%   BMI 28.87 kg/m?  ? ?              Constitutional: Pt appears in NAD ?              [name]: Head: NCAT.  ?              Right Ear: External ear normal.   ?              Left Ear: External ear normal.  ?              Eyes: . Pupils are equal, round, and reactive to light. Conjunctivae and EOM are normal ?              Nose: without d/c or deformity ?              Neck: Neck supple. Gross normal ROM ?              Cardiovascular: Normal rate and regular rhythm.   ?               Pulmonary/Chest: Effort normal and breath sounds without rales or wheezing.  ?              Abd:  Soft, NT, ND, + BS, no organomegaly ?              Neurological: Pt is alert. At baseline orientation, motor grossly intact ?              Skin: Skin is warm. No rashes, no other new lesions, LE edema - trace to 1+ bilateral pedal edema, o/w neurovascularly intact ?              Psychiatric: Pt behavior is normal without agitation  ? ?Micro: none ? ?Cardiac tracings I have personally interpreted today:  none ? ?Pertinent Radiological findings (summarize): none  ? ?Lab Results  ?Component Value Date  ? WBC 5.4 08/09/2021  ? HGB 12.5 (L) 08/09/2021  ? HCT 37.6 (L) 08/09/2021  ? PLT 157.0 08/09/2021  ? GLUCOSE 177 (H) 08/09/2021  ? CHOL 110 08/09/2021  ? TRIG 126.0 08/09/2021  ? HDL 34.40 (L) 08/09/2021  ? [place] 51 08/09/2021  ? ALT 20 08/09/2021  ? AST 17 08/09/2021  ? NA 139 08/09/2021  ? K 4.2 08/09/2021  ? CL 101 08/09/2021  ? CREATININE 1.10 08/09/2021  ? BUN 9 08/09/2021  ? CO2 29 08/09/2021  ? TSH 1.33 08/09/2021  ? PSA 0.14 01/03/2013  ? HGBA1C 7.3 (H) 08/09/2021  ? MICROALBUR 0.6 04/30/2015  ? ?Assessment/Plan:  ?Douglas [name] is a 86 y.o. Black or African American [2] male with  has a past medical history of Adenomatous colon polyp (03/2001), Allergy, Cancer ([place]), Cataract, Dizziness, GERD (gastroesophageal reflux disease), Hearing loss, Hyperlipidemia, and Low testosterone. ? ?Diet-controlled diabetes mellitus ([place]) ?Lab Results  ?Component Value Date  ? HGBA1C 7.3 (H) 08/09/2021  ? ?  Mild uncontrolled, but adequte for age, pt to continue current medical treatment  - diet ? ? ?Pedal edema ?Etiology unclear, cant r/o venous insufficiency vs chf vs other - for BNP with labs, hct 12.5 qd, and compression stockings, leg elevation, low salt diet ? ?B12 deficiency ?Lab Results  ?Component Value Date  ? [id number] 1,097 (H) 08/09/2021  ? ?Stable, cont oral replacement - b12 1000 mcg qd ? ?Vitamin D deficiency ?Last  vitamin D ?Lab Results  ?Component Value Date  ? VD25OH 70.18 08/09/2021  ? ?Stable, cont oral replacement ? ?Followup: Return if symptoms worsen or fail to improve. ? ?Cathlean Cower, MD 08/11/2021 7:57 PM ?[place] ?[place] ?Internal Medicine ?

## 2021-08-11 ENCOUNTER — Encounter: Payer: Self-pay | Admitting: Internal Medicine

## 2021-08-11 NOTE — Assessment & Plan Note (Signed)
Etiology unclear, cant r/o venous insufficiency vs chf vs other - for BNP with labs, hct 12.5 qd, and compression stockings, leg elevation, low salt diet ?

## 2021-08-11 NOTE — Assessment & Plan Note (Signed)
Last vitamin D ?Lab Results  ?Component Value Date  ? VD25OH 70.18 08/09/2021  ? ?Stable, cont oral replacement ? ?

## 2021-08-11 NOTE — Assessment & Plan Note (Signed)
Lab Results  ?Component Value Date  ? [id number] 1,097 (H) 08/09/2021  ? ?Stable, cont oral replacement - b12 1000 mcg qd ?

## 2021-08-11 NOTE — Assessment & Plan Note (Signed)
Lab Results  ?Component Value Date  ? HGBA1C 7.3 (H) 08/09/2021  ? ?Mild uncontrolled, but adequte for age, pt to continue current medical treatment  - diet ? ?

## 2021-08-19 ENCOUNTER — Ambulatory Visit: Payer: Medicare Other | Admitting: Emergency Medicine

## 2021-09-09 ENCOUNTER — Other Ambulatory Visit: Payer: Self-pay | Admitting: Emergency Medicine

## 2021-09-09 DIAGNOSIS — R0981 Nasal congestion: Secondary | ICD-10-CM

## 2021-09-09 DIAGNOSIS — R0982 Postnasal drip: Secondary | ICD-10-CM

## 2021-09-24 DIAGNOSIS — H401122 Primary open-angle glaucoma, left eye, moderate stage: Secondary | ICD-10-CM | POA: Diagnosis not present

## 2021-09-24 DIAGNOSIS — H401111 Primary open-angle glaucoma, right eye, mild stage: Secondary | ICD-10-CM | POA: Diagnosis not present

## 2021-09-26 DIAGNOSIS — R948 Abnormal results of function studies of other organs and systems: Secondary | ICD-10-CM | POA: Diagnosis not present

## 2021-09-26 DIAGNOSIS — E291 Testicular hypofunction: Secondary | ICD-10-CM | POA: Diagnosis not present

## 2021-10-03 DIAGNOSIS — E291 Testicular hypofunction: Secondary | ICD-10-CM | POA: Diagnosis not present

## 2021-10-03 DIAGNOSIS — C61 Malignant neoplasm of prostate: Secondary | ICD-10-CM | POA: Diagnosis not present

## 2021-10-04 ENCOUNTER — Other Ambulatory Visit (HOSPITAL_COMMUNITY): Payer: Self-pay | Admitting: Nurse Practitioner

## 2021-10-04 DIAGNOSIS — C61 Malignant neoplasm of prostate: Secondary | ICD-10-CM

## 2021-10-09 ENCOUNTER — Ambulatory Visit (HOSPITAL_COMMUNITY)
Admission: RE | Admit: 2021-10-09 | Discharge: 2021-10-09 | Disposition: A | Discharge status: Home / Self Care | Payer: Medicare Other | Source: Ambulatory Visit | Attending: Nurse Practitioner | Admitting: Nurse Practitioner

## 2021-10-09 DIAGNOSIS — C61 Malignant neoplasm of prostate: Secondary | ICD-10-CM | POA: Diagnosis not present

## 2021-10-09 MED ORDER — PIFLIFOLASTAT F 18 (PYLARIFY) INJECTION
9.0000 | Freq: Once | INTRAVENOUS | Status: AC
  Administered 2021-10-09: 9.7 via INTRAVENOUS

## 2021-11-05 ENCOUNTER — Encounter: Payer: Self-pay | Admitting: Emergency Medicine

## 2021-11-05 ENCOUNTER — Ambulatory Visit (INDEPENDENT_AMBULATORY_CARE_PROVIDER_SITE_OTHER): Payer: Medicare Other | Admitting: Emergency Medicine

## 2021-11-05 VITALS — BP 134/76 | HR 67 | Temp 98.0°F | Ht 71.0 in | Wt 202.1 lb

## 2021-11-05 DIAGNOSIS — E119 Type 2 diabetes mellitus without complications: Secondary | ICD-10-CM | POA: Diagnosis not present

## 2021-11-05 DIAGNOSIS — E785 Hyperlipidemia, unspecified: Secondary | ICD-10-CM

## 2021-11-05 DIAGNOSIS — G5793 Unspecified mononeuropathy of bilateral lower limbs: Secondary | ICD-10-CM

## 2021-11-05 DIAGNOSIS — H9192 Unspecified hearing loss, left ear: Secondary | ICD-10-CM | POA: Diagnosis not present

## 2021-11-05 LAB — CBC WITH DIFFERENTIAL/PLATELET
Basophils Absolute: 0.1 10*3/uL (ref 0.0–0.1)
Basophils Relative: 1 % (ref 0.0–3.0)
Eosinophils Absolute: 0.1 10*3/uL (ref 0.0–0.7)
Eosinophils Relative: 1.5 % (ref 0.0–5.0)
HCT: 41.7 % (ref 39.0–52.0)
Hemoglobin: 13.9 g/dL (ref 13.0–17.0)
Lymphocytes Relative: 56.9 % — ABNORMAL HIGH (ref 12.0–46.0)
Lymphs Abs: 3.2 10*3/uL (ref 0.7–4.0)
MCHC: 33.3 g/dL (ref 30.0–36.0)
MCV: 96.1 fl (ref 78.0–100.0)
Monocytes Absolute: 0.5 10*3/uL (ref 0.1–1.0)
Monocytes Relative: 9.3 % (ref 3.0–12.0)
Neutro Abs: 1.8 10*3/uL (ref 1.4–7.7)
Neutrophils Relative %: 31.3 % — ABNORMAL LOW (ref 43.0–77.0)
Platelets: 131 10*3/uL — ABNORMAL LOW (ref 150.0–400.0)
RBC: 4.34 Mil/uL (ref 4.22–5.81)
RDW: 12.9 % (ref 11.5–15.5)
WBC: 5.7 10*3/uL (ref 4.0–10.5)

## 2021-11-05 LAB — VITAMIN B12: Vitamin B-12: 1163 pg/mL — ABNORMAL HIGH (ref 211–911)

## 2021-11-05 LAB — COMPREHENSIVE METABOLIC PANEL
ALT: 30 U/L (ref 0–53)
AST: 23 U/L (ref 0–37)
Albumin: 4.5 g/dL (ref 3.5–5.2)
Alkaline Phosphatase: 51 U/L (ref 39–117)
BUN: 14 mg/dL (ref 6–23)
CO2: 30 mEq/L (ref 19–32)
Calcium: 9.9 mg/dL (ref 8.4–10.5)
Chloride: 101 mEq/L (ref 96–112)
Creatinine, Ser: 1.09 mg/dL (ref 0.40–1.50)
GFR: 62.35 mL/min (ref >60.00)
Glucose, Bld: 157 mg/dL — ABNORMAL HIGH (ref 70–99)
Potassium: 3.8 mEq/L (ref 3.5–5.1)
Sodium: 135 mEq/L (ref 135–145)
Total Bilirubin: 0.4 mg/dL (ref 0.2–1.2)
Total Protein: 8.1 g/dL (ref 6.0–8.3)

## 2021-11-05 LAB — HEMOGLOBIN A1C: Hgb A1c MFr Bld: 7.7 % — ABNORMAL HIGH (ref 4.6–6.5)

## 2021-11-05 LAB — TSH: TSH: 1.2 u[IU]/mL (ref 0.35–5.50)

## 2021-11-05 MED ORDER — GABAPENTIN 100 MG PO CAPS: 200.0000 mg | ORAL_CAPSULE | Freq: Every day | ORAL | 3 refills | Status: DC

## 2021-11-05 NOTE — Assessment & Plan Note (Signed)
Normal exam.  No cerumen impaction. Needs ENT evaluation. Referral placed today.

## 2021-11-05 NOTE — Assessment & Plan Note (Signed)
Stable.  Diet and nutrition discussed.  Continue simvastatin 20 mg daily. 

## 2021-11-05 NOTE — Progress Notes (Signed)
Douglas [name] 86 y.o.   Chief Complaint  Patient presents with   Joint Swelling    Swelling in both ankles    HISTORY OF PRESENT ILLNESS: This is a 86 y.o. male complaining of burning and numbness to both feet for the past several months.  Mostly at nighttime.  No other associated symptoms. Also complaining of decreased hearing from left ear. No other complaints or medical concerns today.  HPI   Prior to Admission medications   Medication Sig Start Date End Date Taking? Authorizing Provider  albuterol (VENTOLIN HFA) 108 (90 Base) MCG/ACT inhaler TAKE 2 PUFFS BY MOUTH EVERY 6 HOURS AS NEEDED FOR WHEEZE OR SHORTNESS OF BREATH 06/04/21  Yes Maximiano Coss, NP  alfuzosin (UROXATRAL) 10 MG 24 hr tablet  11/09/14  Yes [provider]  cetirizine (ZYRTEC) 10 MG tablet TAKE 1 TABLET BY MOUTH EVERY DAY 09/09/21  Yes Nohemi Nicklaus, Ines Bloomer, MD  Cholecalciferol (VITAMIN D) 2000 UNITS tablet Take 2,000 Units by mouth daily.   Yes [provider]  fluticasone (FLONASE) 50 MCG/ACT nasal spray Place 1 spray into both nostrils daily. 06/12/21  Yes Sheyla Zaffino, Ines Bloomer, MD  latanoprost (XALATAN) 0.005 % ophthalmic solution Place 1 drop into the left eye at bedtime. 07/14/19  Yes [provider]  Multiple Vitamin (MULTIVITAMIN) tablet Take 1 tablet by mouth daily.   Yes [provider]  pantoprazole (PROTONIX) 40 MG tablet TAKE 1 TABLET BY MOUTH EVERY DAY 06/23/21  Yes [name] Albea, [place], MD  RESTASIS 0.05 % ophthalmic emulsion INSTILL 1 DROP INTO BOTH EYES TWICE A DAY 07/13/17  Yes [provider]  simvastatin (ZOCOR) 20 MG tablet TAKE 1 TABLET DAILY AT 6PM. 06/04/21  Yes Aella Ronda, Ines Bloomer, MD  Budeson-Glycopyrrol-Formoterol (BREZTRI AEROSPHERE) 160-9-4.8 MCG/ACT AERO Inhale 2 puffs into the lungs 2 (two) times daily. Patient not taking: Reported on 08/09/2021 06/18/21   [name], MD  guaiFENesin-codeine [facility]) 100-10 MG/5ML syrup Take 5  mLs by mouth 3 (three) times daily as needed for cough. Patient not taking: Reported on 08/09/2021 05/28/21   [name], MD  hydrochlorothiazide (MICROZIDE) 12.5 MG capsule Take 1 capsule (12.5 mg total) by mouth daily. Patient not taking: Reported on 11/05/2021 08/09/21   Biagio Borg, MD  Testosterone 30 MG/ACT SOLN Place 30 g onto the skin. Patient not taking: Reported on 11/05/2021    [provider]    No Known Allergies  Patient Active Problem List   Diagnosis Date Noted   Pedal edema 08/09/2021   Simple chronic bronchitis ([place]) 06/18/2021   Persistent cough 06/18/2021   Diet-controlled diabetes mellitus ([place]) 09/20/2020   History of prostate cancer 09/20/2020   Atherosclerosis of aorta ([place]) 09/20/2020   Headache 05/17/2018   B12 deficiency 12/16/2016   Bladder outlet obstruction 04/25/2015   Erectile dysfunction due to arterial insufficiency 04/25/2015   Osteoporosis 04/25/2015   Vitamin D deficiency 04/25/2015   Prostate cancer ([place]) 05/30/2011   Dyslipidemia 05/30/2011   Hypogonadism male 05/30/2011    Past Medical History:  Diagnosis Date   Adenomatous colon polyp 03/2001   Allergy    SEASONAL   Cancer ([place])    Cataract    Dizziness    GERD (gastroesophageal reflux disease)    Hearing loss    Hyperlipidemia    Low testosterone     Past Surgical History:  Procedure Laterality Date   APPENDECTOMY     COLONOSCOPY     EYE SURGERY  POLYPECTOMY     PROSTATE SURGERY     SPINE SURGERY      Social History   Socioeconomic History   Marital status: Married    Spouse name: Not on file   Number of children: 3   Years of education: 2 years [employer]   Highest education level: Not on file  Occupational History   Occupation: RETIRED  Tobacco Use   Smoking status: Never   Smokeless tobacco: Never  Vaping Use   Vaping Use: Never used  Substance and Sexual Activity   Alcohol use: No    Alcohol/week: 0.0 standard drinks of alcohol   Drug use: No    Sexual activity: Yes  Other Topics Concern   Not on file  Social History Narrative   Married. Education: [employer]. Exercise: Yes.   Right-handed.   Drinks coffee daily.   Lives at home with his wife.   Social Determinants of Health   Financial Resource Strain: Low Risk  (07/12/2021)   Overall Financial Resource Strain (CARDIA)    Difficulty of Paying Living Expenses: Not hard at all  Food Insecurity: No Food Insecurity (07/12/2021)   Hunger Vital Sign    Worried About Running Out of Food in the Last Year: Never true    Ran Out of Food in the Last Year: Never true  Transportation Needs: No Transportation Needs (07/12/2021)   PRAPARE - Hydrologist (Medical): No    Lack of Transportation (Non-Medical): No  Physical Activity: Sufficiently Active (07/12/2021)   Exercise Vital Sign    Days of Exercise per Week: 3 days    Minutes of Exercise per Session: 50 min  Stress: No Stress Concern Present (07/12/2021)   [place]    Feeling of Stress : Not at all  Social Connections: [place] (07/12/2021)   Social Connection and Isolation Panel [NHANES]    Frequency of Communication with Friends and Family: Twice a week    Frequency of Social Gatherings with Friends and Family: Twice a week    Attends Religious Services: 1 to 4 times per year    Active Member of [employer] or Organizations: Yes    Attends Archivist Meetings: 1 to 4 times per year    Marital Status: Married  Human resources officer Violence: Not At Risk (07/12/2021)   Humiliation, Afraid, Rape, and Kick questionnaire    Fear of Current or Ex-Partner: No    Emotionally Abused: No    Physically Abused: No    Sexually Abused: No    Family History  Problem Relation Age of Onset   Cancer Father 50       pancreatic   Other Mother        age 82   Prostate cancer Brother    Colon cancer Neg Hx    Esophageal cancer Neg Hx     Liver cancer Neg Hx    Pancreatic cancer Neg Hx    Rectal cancer Neg Hx    Stomach cancer Neg Hx      Review of Systems  Constitutional: Negative.  Negative for chills and fever.  [name]: Negative.  Negative for congestion and sore throat.   Respiratory: Negative.  Negative for cough and shortness of breath.   Cardiovascular: Negative.  Negative for chest pain and palpitations.  Gastrointestinal:  Negative for abdominal pain, diarrhea, nausea and vomiting.  Genitourinary: Negative.  Negative for dysuria and hematuria.  Skin: Negative.  Negative  for rash.  Neurological:  Negative for dizziness and headaches.  All other systems reviewed and are negative.  Today's Vitals   11/05/21 1508  BP: 134/76  Pulse: 67  Temp: 98 F (36.7 C)  TempSrc: Oral  SpO2: 94%  Weight: 202 lb 2 oz (91.7 kg)  Height: '5\' 11"'$  (1.803 m)   Body mass index is 28.19 kg/m.   Physical Exam Vitals reviewed.  Constitutional:      Appearance: Normal appearance.  [name]:     Head: Normocephalic.     Right Ear: Tympanic membrane, ear canal and external ear normal.     Left Ear: Tympanic membrane, ear canal and external ear normal.     Mouth/Throat:     Mouth: Mucous membranes are moist.     Pharynx: Oropharynx is clear.  Eyes:     Extraocular Movements: Extraocular movements intact.     Pupils: Pupils are equal, round, and reactive to light.  Cardiovascular:     Rate and Rhythm: Normal rate and regular rhythm.     Pulses: Normal pulses.     Heart sounds: Normal heart sounds.  Pulmonary:     Effort: Pulmonary effort is normal.     Breath sounds: Normal breath sounds.  Abdominal:     Palpations: Abdomen is soft.     Tenderness: There is no abdominal tenderness.  Musculoskeletal:        General: Normal range of motion.     Cervical back: No tenderness.     Right lower leg: No edema.     Left lower leg: No edema.     Comments: Lower extremities: Warm to touch.  No erythema or ecchymosis.  No  swelling.  No tenderness.  Excellent peripheral pulses and capillary refill.  Good sensation.  No significant abnormal findings.  Lymphadenopathy:     Cervical: No cervical adenopathy.  Skin:    General: Skin is warm and dry.     Capillary Refill: Capillary refill takes less than 2 seconds.  Neurological:     General: No focal deficit present.     Mental Status: He is alert and oriented to person, place, and time.  Psychiatric:        Mood and Affect: Mood normal.        Behavior: Behavior normal.      ASSESSMENT & PLAN: A total of 44 minutes was spent with the patient and counseling/coordination of care regarding preparing for this visit, review of most recent office visit notes, review of multiple chronic medical problems and their management, review of all medications and changes made, education on nutrition, differential diagnosis of neuropathic pain to both feet, prognosis, need for blood work, documentation, and need for follow-up.  Problem List Items Addressed This Visit       Endocrine   Diet-controlled diabetes mellitus ([place])    Presently on no medications. Lab Results  Component Value Date   HGBA1C 7.3 (H) 08/09/2021  Diet controlled. May be contributing to neuropathic pain of both feet.       Relevant Orders   Hemoglobin A1c     Nervous and Auditory   Decreased hearing of left ear    Normal exam.  No cerumen impaction. Needs ENT evaluation. Referral placed today.      Relevant Orders   Ambulatory referral to ENT     Other   Dyslipidemia    Stable.  Diet and nutrition discussed. Continue simvastatin 20 mg daily.       Neuropathic  pain of both feet - Primary    May benefit from gabapentin at bedtime to 100 mg. Could be secondary to diabetes. Blood work including vitamin B12, TSH, and hemoglobin A1c done today. No circulation issues identified during physical exam. No signs of chronic venous insufficiency either.      Relevant Medications    gabapentin (NEURONTIN) 100 MG capsule   Other Relevant Orders   CBC with Differential/Platelet   Comprehensive metabolic panel   Vitamin A35   TSH   Hemoglobin A1c   Patient Instructions  Neuropathic Pain Neuropathic pain is pain caused by damage to the nerves that are responsible for certain sensations in your body (sensory nerves). Neuropathic pain can make you more sensitive to pain. Even a minor sensation can feel very painful. This is usually a long-term (chronic) condition that can be difficult to treat. The type of pain differs from person to person. It may: Start suddenly (acute), or it may develop slowly and become chronic. Come and go as damaged nerves heal, or it may stay at the same level for years. Cause emotional distress, loss of sleep, and a lower quality of life. What are the causes? The most common cause of this condition is diabetes. Many other diseases and conditions can also cause neuropathic pain. Causes of neuropathic pain can be classified as: Toxic. This is caused by medicines and chemicals. The most common causes of toxic neuropathic pain is damage from medicines that kill cancer cells (chemotherapy) or alcohol abuse. Metabolic. This can be caused by: Diabetes. Lack of vitamins like B12. Traumatic. Any injury that cuts, crushes, or stretches a nerve can cause damage and pain. Compression-related. If a sensory nerve gets trapped or compressed for a long period of time, the blood supply to the nerve can be cut off. Vascular. Many blood vessel diseases can cause neuropathic pain by decreasing blood supply and oxygen to nerves. Autoimmune. This type of pain results from diseases in which the body's defense system (immune system) mistakenly attacks sensory nerves. Examples of autoimmune diseases that can cause neuropathic pain include lupus and multiple sclerosis. Infectious. Many types of viral infections can damage sensory nerves and cause pain. Shingles infection is a  common cause of this type of pain. Inherited. Neuropathic pain can be a symptom of many diseases that are passed down through families (genetic). What increases the risk? You are more likely to develop this condition if: You have diabetes. You smoke. You drink too much alcohol. You are taking certain medicines, including chemotherapy or medicines that treat immune system disorders. What are the signs or symptoms? The main symptom is pain. Neuropathic pain is often described as: Burning. Shock-like. Stinging. Hot or cold. Itching. How is this diagnosed? No single test can diagnose neuropathic pain. It is diagnosed based on: A physical exam and your symptoms. Your health care provider will ask you about your pain. You may be asked to use a pain scale to describe how bad your pain is. Tests. These may be done to see if you have a cause and location of any nerve damage. They include: Nerve conduction studies and electromyography to test how well nerve signals travel through your nerves and muscles (electrodiagnostic testing). Skin biopsy to evaluate for small fiber neuropathy. Imaging studies, such as: X-rays. CT scan. MRI. How is this treated? Treatment for neuropathic pain may change over time. You may need to try different treatment options or a combination of treatments. Some options include: Treating the underlying cause of the  neuropathy, such as diabetes, kidney disease, or vitamin deficiencies. Stopping medicines that can cause neuropathy, such as chemotherapy. Medicine to relieve pain. Medicines may include: Prescription or over-the-counter pain medicine. Anti-seizure medicine. Antidepressant medicines. Pain-relieving patches or creams that are applied to painful areas of skin. A medicine to numb the area (local anesthetic), which can be injected as a nerve block. Transcutaneous nerve stimulation. This uses electrical currents to block painful nerve signals. The treatment is  painless. Alternative treatments, such as: Acupuncture. Meditation. Massage. Occupational or physical therapy. Pain management programs. Counseling. Follow these instructions at home: Medicines  Take over-the-counter and prescription medicines only as told by your health care provider. Ask your health care provider if the medicine prescribed to you: Requires you to avoid driving or using machinery. Can cause constipation. You may need to take these actions to prevent or treat constipation: Drink enough fluid to keep your urine pale yellow. Take over-the-counter or prescription medicines. Eat foods that are high in fiber, such as beans, whole grains, and fresh fruits and vegetables. Limit foods that are high in fat and processed sugars, such as fried or sweet foods. Lifestyle  Have a good support system at home. Consider joining a chronic pain support group. Do not use any products that contain nicotine or tobacco. These products include cigarettes, chewing tobacco, and vaping devices, such as e-cigarettes. If you need help quitting, ask your health care provider. Do not drink alcohol. General instructions Learn as much as you can about your condition. Work closely with all your health care providers to find the treatment plan that works best for you. Ask your health care provider what activities are safe for you. Keep all follow-up visits. This is important. Contact a health care provider if: Your pain treatments are not working. You are having side effects from your medicines. You are struggling with tiredness (fatigue), mood changes, depression, or anxiety. Get help right away if: You have thoughts of hurting yourself. Get help right away if you feel like you may hurt yourself or others, or have thoughts about taking your own life. Go to your nearest emergency room or: Call 911. Call the [place] at (518)212-5773 or 988. This is open 24 hours a  day. Text the Crisis Text Line at (223) 487-5330. Summary Neuropathic pain is pain caused by damage to the nerves that are responsible for certain sensations in your body (sensory nerves). Neuropathic pain may come and go as damaged nerves heal, or it may stay at the same level for years. Neuropathic pain is usually a long-term condition that can be difficult to treat. Consider joining a chronic pain support group. This information is not intended to replace advice given to you by your health care provider. Make sure you discuss any questions you have with your health care provider. Document Revised: 11/12/2020 Document Reviewed: 11/12/2020 Elsevier Patient Education  [place], MD [facility]

## 2021-11-05 NOTE — Assessment & Plan Note (Signed)
Presently on no medications. Lab Results  Component Value Date   HGBA1C 7.3 (H) 08/09/2021  Diet controlled. May be contributing to neuropathic pain of both feet.

## 2021-11-05 NOTE — Assessment & Plan Note (Signed)
May benefit from gabapentin at bedtime to 100 mg. Could be secondary to diabetes. Blood work including vitamin B12, TSH, and hemoglobin A1c done today. No circulation issues identified during physical exam. No signs of chronic venous insufficiency either.

## 2021-11-05 NOTE — Patient Instructions (Signed)
Neuropathic Pain Neuropathic pain is pain caused by damage to the nerves that are responsible for certain sensations in your body (sensory nerves). Neuropathic pain can make you more sensitive to pain. Even a minor sensation can feel very painful. This is usually a long-term (chronic) condition that can be difficult to treat. The type of pain differs from person to person. It may: Start suddenly (acute), or it may develop slowly and become chronic. Come and go as damaged nerves heal, or it may stay at the same level for years. Cause emotional distress, loss of sleep, and a lower quality of life. What are the causes? The most common cause of this condition is diabetes. Many other diseases and conditions can also cause neuropathic pain. Causes of neuropathic pain can be classified as: Toxic. This is caused by medicines and chemicals. The most common causes of toxic neuropathic pain is damage from medicines that kill cancer cells (chemotherapy) or alcohol abuse. Metabolic. This can be caused by: Diabetes. Lack of vitamins like B12. Traumatic. Any injury that cuts, crushes, or stretches a nerve can cause damage and pain. Compression-related. If a sensory nerve gets trapped or compressed for a long period of time, the blood supply to the nerve can be cut off. Vascular. Many blood vessel diseases can cause neuropathic pain by decreasing blood supply and oxygen to nerves. Autoimmune. This type of pain results from diseases in which the body's defense system (immune system) mistakenly attacks sensory nerves. Examples of autoimmune diseases that can cause neuropathic pain include lupus and multiple sclerosis. Infectious. Many types of viral infections can damage sensory nerves and cause pain. Shingles infection is a common cause of this type of pain. Inherited. Neuropathic pain can be a symptom of many diseases that are passed down through families (genetic). What increases the risk? You are more likely to  develop this condition if: You have diabetes. You smoke. You drink too much alcohol. You are taking certain medicines, including chemotherapy or medicines that treat immune system disorders. What are the signs or symptoms? The main symptom is pain. Neuropathic pain is often described as: Burning. Shock-like. Stinging. Hot or cold. Itching. How is this diagnosed? No single test can diagnose neuropathic pain. It is diagnosed based on: A physical exam and your symptoms. Your health care provider will ask you about your pain. You may be asked to use a pain scale to describe how bad your pain is. Tests. These may be done to see if you have a cause and location of any nerve damage. They include: Nerve conduction studies and electromyography to test how well nerve signals travel through your nerves and muscles (electrodiagnostic testing). Skin biopsy to evaluate for small fiber neuropathy. Imaging studies, such as: X-rays. CT scan. MRI. How is this treated? Treatment for neuropathic pain may change over time. You may need to try different treatment options or a combination of treatments. Some options include: Treating the underlying cause of the neuropathy, such as diabetes, kidney disease, or vitamin deficiencies. Stopping medicines that can cause neuropathy, such as chemotherapy. Medicine to relieve pain. Medicines may include: Prescription or over-the-counter pain medicine. Anti-seizure medicine. Antidepressant medicines. Pain-relieving patches or creams that are applied to painful areas of skin. A medicine to numb the area (local anesthetic), which can be injected as a nerve block. Transcutaneous nerve stimulation. This uses electrical currents to block painful nerve signals. The treatment is painless. Alternative treatments, such as: Acupuncture. Meditation. Massage. Occupational or physical therapy. Pain management programs. Counseling. Follow   these instructions at  home: Medicines  Take over-the-counter and prescription medicines only as told by your health care provider. Ask your health care provider if the medicine prescribed to you: Requires you to avoid driving or using machinery. Can cause constipation. You may need to take these actions to prevent or treat constipation: Drink enough fluid to keep your urine pale yellow. Take over-the-counter or prescription medicines. Eat foods that are high in fiber, such as beans, whole grains, and fresh fruits and vegetables. Limit foods that are high in fat and processed sugars, such as fried or sweet foods. Lifestyle  Have a good support system at home. Consider joining a chronic pain support group. Do not use any products that contain nicotine or tobacco. These products include cigarettes, chewing tobacco, and vaping devices, such as e-cigarettes. If you need help quitting, ask your health care provider. Do not drink alcohol. General instructions Learn as much as you can about your condition. Work closely with all your health care providers to find the treatment plan that works best for you. Ask your health care provider what activities are safe for you. Keep all follow-up visits. This is important. Contact a health care provider if: Your pain treatments are not working. You are having side effects from your medicines. You are struggling with tiredness (fatigue), mood changes, depression, or anxiety. Get help right away if: You have thoughts of hurting yourself. Get help right away if you feel like you may hurt yourself or others, or have thoughts about taking your own life. Go to your nearest emergency room or: Call 911. Call the National Suicide Prevention Lifeline at 1-800-273-8255 or 988. This is open 24 hours a day. Text the Crisis Text Line at 741741. Summary Neuropathic pain is pain caused by damage to the nerves that are responsible for certain sensations in your body (sensory  nerves). Neuropathic pain may come and go as damaged nerves heal, or it may stay at the same level for years. Neuropathic pain is usually a long-term condition that can be difficult to treat. Consider joining a chronic pain support group. This information is not intended to replace advice given to you by your health care provider. Make sure you discuss any questions you have with your health care provider. Document Revised: 11/12/2020 Document Reviewed: 11/12/2020 Elsevier Patient Education  2023 [employer]  

## 2021-12-06 DIAGNOSIS — H401111 Primary open-angle glaucoma, right eye, mild stage: Secondary | ICD-10-CM | POA: Diagnosis not present

## 2021-12-06 DIAGNOSIS — H04123 Dry eye syndrome of bilateral lacrimal glands: Secondary | ICD-10-CM | POA: Diagnosis not present

## 2021-12-06 DIAGNOSIS — Z79899 Other long term (current) drug therapy: Secondary | ICD-10-CM | POA: Diagnosis not present

## 2021-12-06 DIAGNOSIS — H401122 Primary open-angle glaucoma, left eye, moderate stage: Secondary | ICD-10-CM | POA: Diagnosis not present

## 2021-12-10 ENCOUNTER — Encounter: Payer: Self-pay | Admitting: Podiatry

## 2021-12-10 ENCOUNTER — Ambulatory Visit (INDEPENDENT_AMBULATORY_CARE_PROVIDER_SITE_OTHER): Payer: Medicare Other | Admitting: Podiatry

## 2021-12-10 ENCOUNTER — Ambulatory Visit (INDEPENDENT_AMBULATORY_CARE_PROVIDER_SITE_OTHER): Payer: Medicare Other

## 2021-12-10 DIAGNOSIS — G5793 Unspecified mononeuropathy of bilateral lower limbs: Secondary | ICD-10-CM

## 2021-12-10 DIAGNOSIS — M778 Other enthesopathies, not elsewhere classified: Secondary | ICD-10-CM

## 2021-12-10 DIAGNOSIS — G629 Polyneuropathy, unspecified: Secondary | ICD-10-CM

## 2021-12-10 MED ORDER — GABAPENTIN 300 MG PO CAPS: 300.0000 mg | ORAL_CAPSULE | Freq: Every day | ORAL | 3 refills | Status: DC

## 2021-12-10 NOTE — Progress Notes (Signed)
  Subjective:  Patient ID: Douglas [name], male    DOB: 1936/02/16,   MRN: [id number]  Chief Complaint  Patient presents with   Foot Pain    Bilateral foot pain and swelling. Patient states he stopped wear compression socks about a month ago because it was rubbing his skin off. Dull, tingling, burning, numbness feeling. Non diabetic     86 y.o. male presents for concern of bilateral foot pain and swelling that has been going on for several months. Relate he was wearing compression but had to stop because it was rubbing his skin. Relates burning and numb feeling. He is not diabetic.  Marland Kitchen Denies any other pedal complaints. Denies n/v/f/c.   Past Medical History:  Diagnosis Date   Adenomatous colon polyp 03/2001   Allergy    SEASONAL   Cancer ([place])    Cataract    Dizziness    GERD (gastroesophageal reflux disease)    Hearing loss    Hyperlipidemia    Low testosterone     Objective:  Physical Exam: Vascular: DP/PT pulses 2/4 bilateral. CFT <3 seconds. Absent hair growth on digits. Edema noted to bilateral lower extremities. Xerosis noted bilaterally.  Skin. No lacerations or abrasions bilateral feet. Nails 1-5 bilateral  normal in appearacne.  Musculoskeletal: MMT 5/5 bilateral lower extremities in DF, PF, Inversion and Eversion. Deceased ROM in DF of ankle joint.  Neurological: Sensation intact to light touch. Protective sensation diminished bilateral.    Assessment:   1. Neuropathy   2. Neuropathic pain of both feet      Plan:  Patient was evaluated and treated and all questions answered. Discussed neuropathy and etiology as well as treatment with patient.  Radiographs reviewed and discussed with patient.  -Discussed and educated patient o foot care, especially with  regards to the vascular, neurological and musculoskeletal systems.  -Discussed supportive shoes at all times and checking feet regularly.  -Will increase gabapentin to 300 mg nightly.l  -Discussed topical creams  to try.  -Discussed some of this pain could be coming from previous back issues. Had back surgery 30 years ago.  -Patient to return in 3 months for follow-up evaluation.    Lorenda Peck, DPM

## 2021-12-24 ENCOUNTER — Other Ambulatory Visit: Payer: Self-pay | Admitting: Podiatry

## 2021-12-24 DIAGNOSIS — M778 Other enthesopathies, not elsewhere classified: Secondary | ICD-10-CM

## 2021-12-26 DIAGNOSIS — R948 Abnormal results of function studies of other organs and systems: Secondary | ICD-10-CM | POA: Diagnosis not present

## 2021-12-26 DIAGNOSIS — E291 Testicular hypofunction: Secondary | ICD-10-CM | POA: Diagnosis not present

## 2022-01-02 DIAGNOSIS — C61 Malignant neoplasm of prostate: Secondary | ICD-10-CM | POA: Diagnosis not present

## 2022-01-02 DIAGNOSIS — E291 Testicular hypofunction: Secondary | ICD-10-CM | POA: Diagnosis not present

## 2022-01-02 DIAGNOSIS — Z23 Encounter for immunization: Secondary | ICD-10-CM | POA: Diagnosis not present

## 2022-01-03 ENCOUNTER — Other Ambulatory Visit: Payer: Self-pay | Admitting: Emergency Medicine

## 2022-01-03 DIAGNOSIS — E785 Hyperlipidemia, unspecified: Secondary | ICD-10-CM

## 2022-01-27 DIAGNOSIS — R059 Cough, unspecified: Secondary | ICD-10-CM | POA: Diagnosis not present

## 2022-01-30 DIAGNOSIS — H04123 Dry eye syndrome of bilateral lacrimal glands: Secondary | ICD-10-CM | POA: Diagnosis not present

## 2022-01-30 DIAGNOSIS — H401111 Primary open-angle glaucoma, right eye, mild stage: Secondary | ICD-10-CM | POA: Diagnosis not present

## 2022-01-30 DIAGNOSIS — H401122 Primary open-angle glaucoma, left eye, moderate stage: Secondary | ICD-10-CM | POA: Diagnosis not present

## 2022-01-30 DIAGNOSIS — Z79899 Other long term (current) drug therapy: Secondary | ICD-10-CM | POA: Diagnosis not present

## 2022-02-04 DIAGNOSIS — H524 Presbyopia: Secondary | ICD-10-CM | POA: Diagnosis not present

## 2022-02-04 DIAGNOSIS — H401111 Primary open-angle glaucoma, right eye, mild stage: Secondary | ICD-10-CM | POA: Diagnosis not present

## 2022-02-04 DIAGNOSIS — H401122 Primary open-angle glaucoma, left eye, moderate stage: Secondary | ICD-10-CM | POA: Diagnosis not present

## 2022-02-05 ENCOUNTER — Ambulatory Visit (INDEPENDENT_AMBULATORY_CARE_PROVIDER_SITE_OTHER): Payer: Medicare Other | Admitting: Emergency Medicine

## 2022-02-05 ENCOUNTER — Encounter: Payer: Self-pay | Admitting: Emergency Medicine

## 2022-02-05 ENCOUNTER — Ambulatory Visit (INDEPENDENT_AMBULATORY_CARE_PROVIDER_SITE_OTHER): Payer: Medicare Other

## 2022-02-05 VITALS — BP 128/68 | HR 78 | Temp 98.5°F | Ht 71.0 in | Wt 200.2 lb

## 2022-02-05 DIAGNOSIS — R059 Cough, unspecified: Secondary | ICD-10-CM | POA: Diagnosis not present

## 2022-02-05 DIAGNOSIS — J22 Unspecified acute lower respiratory infection: Secondary | ICD-10-CM | POA: Insufficient documentation

## 2022-02-05 DIAGNOSIS — E119 Type 2 diabetes mellitus without complications: Secondary | ICD-10-CM | POA: Diagnosis not present

## 2022-02-05 DIAGNOSIS — Z1211 Encounter for screening for malignant neoplasm of colon: Secondary | ICD-10-CM

## 2022-02-05 DIAGNOSIS — I700 Atherosclerosis of aorta: Secondary | ICD-10-CM

## 2022-02-05 DIAGNOSIS — G5793 Unspecified mononeuropathy of bilateral lower limbs: Secondary | ICD-10-CM

## 2022-02-05 DIAGNOSIS — J410 Simple chronic bronchitis: Secondary | ICD-10-CM

## 2022-02-05 DIAGNOSIS — R051 Acute cough: Secondary | ICD-10-CM

## 2022-02-05 MED ORDER — BENZONATATE 200 MG PO CAPS: 200.0000 mg | ORAL_CAPSULE | Freq: Two times a day (BID) | ORAL | 0 refills | Status: DC | PRN

## 2022-02-05 MED ORDER — AZITHROMYCIN 250 MG PO TABS: ORAL_TABLET | ORAL | 0 refills | Status: DC

## 2022-02-05 NOTE — Assessment & Plan Note (Signed)
No pneumonia on chest x-ray. We will start azithromycin daily for 5 days

## 2022-02-05 NOTE — Assessment & Plan Note (Signed)
Diet and nutrition discussed. Continue simvastatin 20 mg daily.

## 2022-02-05 NOTE — Assessment & Plan Note (Signed)
With acute exacerbation today. Continue Breztri 2 puffs 2 times daily May benefit from daily azithromycin for 5 days

## 2022-02-05 NOTE — Assessment & Plan Note (Signed)
Improved.  Continue gabapentin 300 mg at bedtime

## 2022-02-05 NOTE — Progress Notes (Signed)
Douglas [name] 86 y.o.   Chief Complaint  Patient presents with   Follow-up    21mth f/u appt, patient states he has a cold, coughing, hoarse voice, x 1 month     HISTORY OF PRESENT ILLNESS: This is a 86y.o. male complaining of persistent cough for [redacted] weeks along with hoarse voice and some congestion. Otherwise doing well.  No other complaints or medical concerns today.  HPI   Prior to Admission medications   Medication Sig Start Date End Date Taking? Authorizing Provider  albuterol (VENTOLIN HFA) 108 (90 Base) MCG/ACT inhaler TAKE 2 PUFFS BY MOUTH EVERY 6 HOURS AS NEEDED FOR WHEEZE OR SHORTNESS OF BREATH 06/04/21  Yes MMaximiano Coss NP  alfuzosin (UROXATRAL) 10 MG 24 hr tablet  11/09/14  Yes [provider]  Budeson-Glycopyrrol-Formoterol (BREZTRI AEROSPHERE) 160-9-4.8 MCG/ACT AERO Inhale 2 puffs into the lungs 2 (two) times daily. 06/18/21  Yes SHorald Pollen MD  cetirizine (ZYRTEC) 10 MG tablet TAKE 1 TABLET BY MOUTH EVERY DAY 09/09/21  Yes Rhyleigh Grassel, MInes Bloomer MD  Cholecalciferol (VITAMIN D) 2000 UNITS tablet Take 2,000 Units by mouth daily.   Yes [provider]  fluticasone (FLONASE) 50 MCG/ACT nasal spray Place 1 spray into both nostrils daily. 06/12/21  Yes Lateasha Breuer, MInes Bloomer MD  gabapentin (NEURONTIN) 300 MG capsule Take 1 capsule (300 mg total) by mouth at bedtime. 12/10/21  Yes SLorenda Peck DPM  guaiFENesin-codeine (ROBITUSSIN AC) 100-10 MG/5ML syrup Take 5 mLs by mouth 3 (three) times daily as needed for cough. 05/28/21  Yes Gredmarie Delange, MInes Bloomer MD  latanoprost (XALATAN) 0.005 % ophthalmic solution Place 1 drop into the left eye at bedtime. 07/14/19  Yes [provider]  Multiple Vitamin (MULTIVITAMIN) tablet Take 1 tablet by mouth daily.   Yes [provider]  pantoprazole (PROTONIX) 40 MG tablet TAKE 1 TABLET BY MOUTH EVERY DAY 06/23/21  Yes Donnalynn Wheeless, M[place] MD  RESTASIS 0.05 % ophthalmic emulsion INSTILL 1 DROP INTO  BOTH EYES TWICE A DAY 07/13/17  Yes [provider]  simvastatin (ZOCOR) 20 MG tablet TAKE 1 TABLET DAILY AT 6PM. 01/03/22  Yes Blue Ruggerio, MInes Bloomer MD  Testosterone 30 MG/ACT SOLN Place 30 g onto the skin.   Yes [provider]    No Known Allergies  Patient Active Problem List   Diagnosis Date Noted   Neuropathic pain of both feet 11/05/2021   Decreased hearing of left ear 11/05/2021   Pedal edema 08/09/2021   Simple chronic bronchitis ([place] 06/18/2021   Persistent cough 06/18/2021   Diet-controlled diabetes mellitus ([place] 09/20/2020   History of prostate cancer 09/20/2020   Atherosclerosis of aorta ([facility] 09/20/2020   B12 deficiency 12/16/2016   Bladder outlet obstruction 04/25/2015   Erectile dysfunction due to arterial insufficiency 04/25/2015   Osteoporosis 04/25/2015   Vitamin D deficiency 04/25/2015   Prostate cancer ([place] 05/30/2011   Dyslipidemia 05/30/2011   Hypogonadism male 05/30/2011    Past Medical History:  Diagnosis Date   Adenomatous colon polyp 03/2001   Allergy    SEASONAL   Cancer ([facility]    Cataract    Dizziness    GERD (gastroesophageal reflux disease)    Hearing loss    Hyperlipidemia    Low testosterone     Past Surgical History:  Procedure Laterality Date   APPENDECTOMY     COLONOSCOPY     EYE SURGERY     POLYPECTOMY     PROSTATE SURGERY     SPINE SURGERY  Social History   Socioeconomic History   Marital status: Married    Spouse name: Not on file   Number of children: 3   Years of education: 2 years [employer]   Highest education level: Not on file  Occupational History   Occupation: RETIRED  Tobacco Use   Smoking status: Never   Smokeless tobacco: Never  Vaping Use   Vaping Use: Never used  Substance and Sexual Activity   Alcohol use: No    Alcohol/week: 0.0 standard drinks of alcohol   Drug use: No   Sexual activity: Yes  Other Topics Concern   Not on file  Social History Narrative   Married. Education:  [employer]. Exercise: Yes.   Right-handed.   Drinks coffee daily.   Lives at home with his wife.   Social Determinants of Health   Financial Resource Strain: Low Risk  (07/12/2021)   Overall Financial Resource Strain (CARDIA)    Difficulty of Paying Living Expenses: Not hard at all  Food Insecurity: No Food Insecurity (07/12/2021)   Hunger Vital Sign    Worried About Running Out of Food in the Last Year: Never true    Ran Out of Food in the Last Year: Never true  Transportation Needs: No Transportation Needs (07/12/2021)   PRAPARE - Hydrologist (Medical): No    Lack of Transportation (Non-Medical): No  Physical Activity: Sufficiently Active (07/12/2021)   Exercise Vital Sign    Days of Exercise per Week: 3 days    Minutes of Exercise per Session: 50 min  Stress: No Stress Concern Present (07/12/2021)   [place]    Feeling of Stress : Not at all  Social Connections: [place] (07/12/2021)   Social Connection and Isolation Panel [NHANES]    Frequency of Communication with Friends and Family: Twice a week    Frequency of Social Gatherings with Friends and Family: Twice a week    Attends Religious Services: 1 to 4 times per year    Active Member of [employer] or Organizations: Yes    Attends Archivist Meetings: 1 to 4 times per year    Marital Status: Married  Human resources officer Violence: Not At Risk (07/12/2021)   Humiliation, Afraid, Rape, and Kick questionnaire    Fear of Current or Ex-Partner: No    Emotionally Abused: No    Physically Abused: No    Sexually Abused: No    Family History  Problem Relation Age of Onset   Cancer Father 34       pancreatic   Other Mother        age 29   Prostate cancer Brother    Colon cancer Neg Hx    Esophageal cancer Neg Hx    Liver cancer Neg Hx    Pancreatic cancer Neg Hx    Rectal cancer Neg Hx    Stomach cancer Neg Hx       Review of Systems  Constitutional: Negative.   [name]:  Positive for congestion.   Eyes: Negative.   Respiratory:  Positive for cough.   Gastrointestinal: Negative.  Negative for abdominal pain, diarrhea, nausea and vomiting.  Genitourinary: Negative.   Skin: Negative.  Negative for rash.  Neurological: Negative.  Negative for dizziness and headaches.  All other systems reviewed and are negative.   Today's Vitals   02/05/22 1040  BP: 128/68  Pulse: 78  Temp: 98.5 F (36.9 C)  TempSrc: Oral  SpO2: 94%  Weight: 200 lb 4 oz (90.8 kg)  Height: '5\' 11"'$  (1.803 m)   Body mass index is 27.93 kg/m. Wt Readings from Last 3 Encounters:  02/05/22 200 lb 4 oz (90.8 kg)  11/05/21 202 lb 2 oz (91.7 kg)  08/09/21 207 lb (93.9 kg)    Physical Exam Vitals reviewed.  Constitutional:      Appearance: Normal appearance.  [name]:     Head: Normocephalic.     Mouth/Throat:     Mouth: Mucous membranes are moist.     Pharynx: Oropharynx is clear.  Eyes:     Extraocular Movements: Extraocular movements intact.     Conjunctiva/sclera: Conjunctivae normal.     Pupils: Pupils are equal, round, and reactive to light.  Cardiovascular:     Rate and Rhythm: Normal rate and regular rhythm.     Pulses: Normal pulses.     Heart sounds: Normal heart sounds.  Pulmonary:     Effort: Pulmonary effort is normal.     Breath sounds: Normal breath sounds.  Musculoskeletal:     Cervical back: No tenderness.  Lymphadenopathy:     Cervical: No cervical adenopathy.  Skin:    General: Skin is warm and dry.  Neurological:     General: No focal deficit present.     Mental Status: He is alert and oriented to person, place, and time.  Psychiatric:        Mood and Affect: Mood normal.        Behavior: Behavior normal.    DG Chest 2 View  Result Date: 02/05/2022 CLINICAL DATA:  86 year old male with cough for 3-4 weeks. EXAM: CHEST - 2 VIEW COMPARISON:  Chest radiographs 05/28/2021 and earlier.  FINDINGS: Lung volumes and mediastinal contours remain normal. Lung markings appear stable since 2016. No pneumothorax, pulmonary edema, pleural effusion or acute pulmonary opacity. Visualized tracheal air column is within normal limits. No acute osseous abnormality identified. Negative visible bowel gas. IMPRESSION: No acute cardiopulmonary abnormality. Electronically Signed   By: Genevie Ann M.D.   On: 02/05/2022 11:19     ASSESSMENT & PLAN: A total of 47 minutes was spent with the patient and counseling/coordination of care regarding preparing for this visit, review of most recent office visit notes, review of multiple chronic medical problems and their management, review of all medications, review of chest x-ray report, diagnosis of lower respiratory infection and need for antibiotics, cough management, education on nutrition, prognosis, documentation, need for follow-up.  Problem List Items Addressed This Visit       Cardiovascular and Mediastinum   Atherosclerosis of aorta ([facility])    Diet and nutrition discussed. Continue simvastatin 20 mg daily.        Respiratory   Simple chronic bronchitis ([place])    With acute exacerbation today. Continue Breztri 2 puffs 2 times daily May benefit from daily azithromycin for 5 days      Lower respiratory infection - Primary    No pneumonia on chest x-ray. We will start azithromycin daily for 5 days      Relevant Medications   azithromycin (ZITHROMAX) 250 MG tablet     Endocrine   Diet-controlled diabetes mellitus ([place])    Diet and nutrition discussed. Lab Results  Component Value Date   HGBA1C 7.7 (H) 11/05/2021          Other   Acute cough    Affecting quality of life.  Continue over-the-counter Mucinex DM and start Tessalon 200 mg 3 times a  day as needed.      Relevant Medications   benzonatate (TESSALON) 200 MG capsule   Other Relevant Orders   DG Chest 2 View (Completed)   Neuropathic pain of both feet    Improved.  Continue  gabapentin 300 mg at bedtime      Other Visit Diagnoses     Colon cancer screening       Relevant Orders   Ambulatory referral to Gastroenterology        Patient Instructions  Cough, Adult A cough helps to clear your throat and lungs. A cough may be a sign of an illness or another medical condition. An acute cough may only last 2-3 weeks, while a chronic cough may last 8 or more weeks. Many things can cause a cough. They include: Germs (viruses or bacteria) that attack the airway. Breathing in things that bother (irritate) your lungs. Allergies. Asthma. Mucus that runs down the back of your throat (postnasal drip). Smoking. Acid backing up from the stomach into the tube that moves food from the mouth to the stomach (gastroesophageal reflux). Some medicines. Lung problems. Other medical conditions, such as heart failure or a blood clot in the lung (pulmonary embolism). Follow these instructions at home: Medicines Take over-the-counter and prescription medicines only as told by your doctor. Talk with your doctor before you take medicines that stop a cough (cough suppressants). Lifestyle  Do not smoke, and try not to be around smoke. Do not use any products that contain nicotine or tobacco, such as cigarettes, e-cigarettes, and chewing tobacco. If you need help quitting, ask your doctor. Drink enough fluid to keep your pee (urine) pale yellow. Avoid caffeine. Do not drink alcohol if your doctor tells you not to drink. General instructions  Watch for any changes in your cough. Tell your doctor about them. Always cover your mouth when you cough. Stay away from things that make you cough, such as perfume, candles, campfire smoke, or cleaning products. If the air is dry, use a cool mist vaporizer or humidifier in your home. If your cough is worse at night, try using extra pillows to raise your head up higher while you sleep. Rest as needed. Keep all follow-up visits as told by  your doctor. This is important. Contact a doctor if: You have new symptoms. You cough up pus. Your cough does not get better after 2-3 weeks, or your cough gets worse. Cough medicine does not help your cough and you are not sleeping well. You have pain that gets worse or pain that is not helped with medicine. You have a fever. You are losing weight and you do not know why. You have night sweats. Get help right away if: You cough up blood. You have trouble breathing. Your heartbeat is very fast. These symptoms may be an emergency. Do not wait to see if the symptoms will go away. Get medical help right away. Call your local emergency services (911 in the [place] Do not drive yourself to the hospital. Summary A cough helps to clear your throat and lungs. Many things can cause a cough. Take over-the-counter and prescription medicines only as told by your doctor. Always cover your mouth when you cough. Contact a doctor if you have new symptoms or you have a cough that does not get better or gets worse. This information is not intended to replace advice given to you by your health care provider. Make sure you discuss any questions you have with your health care provider.  Document Revised: 05/06/2019 Document Reviewed: 04/05/2018 Elsevier Patient Education  [place], MD [facility]

## 2022-02-05 NOTE — Assessment & Plan Note (Signed)
Affecting quality of life.  Continue over-the-counter Mucinex DM and start Tessalon 200 mg 3 times a day as needed.

## 2022-02-05 NOTE — Patient Instructions (Signed)

## 2022-02-05 NOTE — Assessment & Plan Note (Signed)
Diet and nutrition discussed. Lab Results  Component Value Date   HGBA1C 7.7 (H) 11/05/2021

## 2022-03-02 ENCOUNTER — Other Ambulatory Visit: Payer: Self-pay | Admitting: Emergency Medicine

## 2022-03-02 DIAGNOSIS — K219 Gastro-esophageal reflux disease without esophagitis: Secondary | ICD-10-CM

## 2022-03-06 ENCOUNTER — Encounter: Payer: Self-pay | Admitting: Emergency Medicine

## 2022-03-06 ENCOUNTER — Ambulatory Visit (INDEPENDENT_AMBULATORY_CARE_PROVIDER_SITE_OTHER): Payer: Medicare Other | Admitting: Emergency Medicine

## 2022-03-06 ENCOUNTER — Ambulatory Visit (INDEPENDENT_AMBULATORY_CARE_PROVIDER_SITE_OTHER): Payer: Medicare Other

## 2022-03-06 VITALS — BP 142/88 | HR 78 | Temp 98.3°F | Ht 71.0 in | Wt 204.0 lb

## 2022-03-06 DIAGNOSIS — R059 Cough, unspecified: Secondary | ICD-10-CM | POA: Diagnosis not present

## 2022-03-06 DIAGNOSIS — J040 Acute laryngitis: Secondary | ICD-10-CM | POA: Diagnosis not present

## 2022-03-06 DIAGNOSIS — R053 Chronic cough: Secondary | ICD-10-CM

## 2022-03-06 DIAGNOSIS — J069 Acute upper respiratory infection, unspecified: Secondary | ICD-10-CM | POA: Diagnosis not present

## 2022-03-06 MED ORDER — PREDNISONE 20 MG PO TABS: 40.0000 mg | ORAL_TABLET | Freq: Every day | ORAL | 0 refills | Status: AC

## 2022-03-06 MED ORDER — DM-GUAIFENESIN ER 30-600 MG PO TB12: 1.0000 | ORAL_TABLET | Freq: Two times a day (BID) | ORAL | 1 refills | Status: DC

## 2022-03-06 NOTE — Assessment & Plan Note (Addendum)
Mucinex DM twice a day for the next 5 to 7 days Hydration.  Cough drops. Chest x-ray done today.

## 2022-03-06 NOTE — Progress Notes (Signed)
Douglas [name] 86 y.o.   Chief Complaint  Patient presents with   Acute Visit    Hoarse, still having cold symptoms     HISTORY OF PRESENT ILLNESS: This is a 86 y.o. male complaining of hoarse voice.  Still having dry cough and chest congestion Seen by me on 02/05/2022 with lower respiratory infection and started on azithromycin. No new symptoms just persistent ones. No other complaint or medical concerns today.  HPI   Prior to Admission medications   Medication Sig Start Date End Date Taking? Authorizing Provider  albuterol (VENTOLIN HFA) 108 (90 Base) MCG/ACT inhaler TAKE 2 PUFFS BY MOUTH EVERY 6 HOURS AS NEEDED FOR WHEEZE OR SHORTNESS OF BREATH 06/04/21  Yes Maximiano Coss, NP  alfuzosin (UROXATRAL) 10 MG 24 hr tablet  11/09/14  Yes [provider]  Budeson-Glycopyrrol-Formoterol (BREZTRI AEROSPHERE) 160-9-4.8 MCG/ACT AERO Inhale 2 puffs into the lungs 2 (two) times daily. 06/18/21  Yes [name], MD  cetirizine (ZYRTEC) 10 MG tablet TAKE 1 TABLET BY MOUTH EVERY DAY 09/09/21  Yes Amaziah Raisanen, Ines Bloomer, MD  Cholecalciferol (VITAMIN D) 2000 UNITS tablet Take 2,000 Units by mouth daily.   Yes [provider]  fluticasone (FLONASE) 50 MCG/ACT nasal spray Place 1 spray into both nostrils daily. 06/12/21  Yes Aleigha Gilani, Ines Bloomer, MD  gabapentin (NEURONTIN) 300 MG capsule Take 1 capsule (300 mg total) by mouth at bedtime. 12/10/21  Yes Lorenda Peck, DPM  latanoprost (XALATAN) 0.005 % ophthalmic solution Place 1 drop into the left eye at bedtime. 07/14/19  Yes [provider]  Multiple Vitamin (MULTIVITAMIN) tablet Take 1 tablet by mouth daily.   Yes [provider]  pantoprazole (PROTONIX) 40 MG tablet TAKE 1 TABLET BY MOUTH EVERY DAY 03/02/22  Yes Esther Bradstreet, [place], MD  RESTASIS 0.05 % ophthalmic emulsion INSTILL 1 DROP INTO BOTH EYES TWICE A DAY 07/13/17  Yes [provider]  simvastatin (ZOCOR) 20 MG tablet TAKE 1 TABLET DAILY AT  6PM. 01/03/22  Yes Evart Mcdonnell, Ines Bloomer, MD  Testosterone 30 MG/ACT SOLN Place 30 g onto the skin.   Yes [provider]    No Known Allergies  Patient Active Problem List   Diagnosis Date Noted   Lower respiratory infection 02/05/2022   Neuropathic pain of both feet 11/05/2021   Decreased hearing of left ear 11/05/2021   Pedal edema 08/09/2021   Simple chronic bronchitis ([place]) 06/18/2021   Acute cough 06/18/2021   Diet-controlled diabetes mellitus ([place]) 09/20/2020   History of prostate cancer 09/20/2020   Atherosclerosis of aorta ([place]) 09/20/2020   B12 deficiency 12/16/2016   Bladder outlet obstruction 04/25/2015   Erectile dysfunction due to arterial insufficiency 04/25/2015   Osteoporosis 04/25/2015   Vitamin D deficiency 04/25/2015   Prostate cancer ([place]) 05/30/2011   Dyslipidemia 05/30/2011   Hypogonadism male 05/30/2011    Past Medical History:  Diagnosis Date   Adenomatous colon polyp 03/2001   Allergy    SEASONAL   Cancer ([place])    Cataract    Dizziness    GERD (gastroesophageal reflux disease)    Hearing loss    Hyperlipidemia    Low testosterone     Past Surgical History:  Procedure Laterality Date   APPENDECTOMY     COLONOSCOPY     EYE SURGERY     POLYPECTOMY     PROSTATE SURGERY     SPINE SURGERY      Social History   Socioeconomic History   Marital status: Married  Spouse name: Not on file   Number of children: 3   Years of education: 2 years [employer]   Highest education level: Not on file  Occupational History   Occupation: RETIRED  Tobacco Use   Smoking status: Never   Smokeless tobacco: Never  Vaping Use   Vaping Use: Never used  Substance and Sexual Activity   Alcohol use: No    Alcohol/week: 0.0 standard drinks of alcohol   Drug use: No   Sexual activity: Yes  Other Topics Concern   Not on file  Social History Narrative   Married. Education: [employer]. Exercise: Yes.   Right-handed.   Drinks coffee daily.   Lives at home  with his wife.   Social Determinants of Health   Financial Resource Strain: Low Risk  (07/12/2021)   Overall Financial Resource Strain (CARDIA)    Difficulty of Paying Living Expenses: Not hard at all  Food Insecurity: No Food Insecurity (07/12/2021)   Hunger Vital Sign    Worried About Running Out of Food in the Last Year: Never true    Ran Out of Food in the Last Year: Never true  Transportation Needs: No Transportation Needs (07/12/2021)   PRAPARE - Hydrologist (Medical): No    Lack of Transportation (Non-Medical): No  Physical Activity: Sufficiently Active (07/12/2021)   Exercise Vital Sign    Days of Exercise per Week: 3 days    Minutes of Exercise per Session: 50 min  Stress: No Stress Concern Present (07/12/2021)   [place]    Feeling of Stress : Not at all  Social Connections: [place] (07/12/2021)   Social Connection and Isolation Panel [NHANES]    Frequency of Communication with Friends and Family: Twice a week    Frequency of Social Gatherings with Friends and Family: Twice a week    Attends Religious Services: 1 to 4 times per year    Active Member of [employer] or Organizations: Yes    Attends Archivist Meetings: 1 to 4 times per year    Marital Status: Married  Human resources officer Violence: Not At Risk (07/12/2021)   Humiliation, Afraid, Rape, and Kick questionnaire    Fear of Current or Ex-Partner: No    Emotionally Abused: No    Physically Abused: No    Sexually Abused: No    Family History  Problem Relation Age of Onset   Cancer Father 64       pancreatic   Other Mother        age 17   Prostate cancer Brother    Colon cancer Neg Hx    Esophageal cancer Neg Hx    Liver cancer Neg Hx    Pancreatic cancer Neg Hx    Rectal cancer Neg Hx    Stomach cancer Neg Hx      Review of Systems  Constitutional: Negative.  Negative for chills and fever.   [name]:  Positive for congestion.   Respiratory:  Positive for cough.   Cardiovascular:  Negative for chest pain and palpitations.  Gastrointestinal:  Negative for abdominal pain, nausea and vomiting.  Genitourinary:  Negative for dysuria and hematuria.  Skin: Negative.  Negative for rash.  Neurological:  Negative for dizziness and headaches.  All other systems reviewed and are negative.   Today's Vitals   03/06/22 1404  Weight: 204 lb (92.5 kg)  Height: '5\' 11"'$  (1.803 m)   Body mass index is  28.45 kg/m.  Physical Exam Vitals reviewed.  Constitutional:      Appearance: Normal appearance.  [name]:     Head: Normocephalic.     Mouth/Throat:     Mouth: Mucous membranes are moist.     Pharynx: Oropharynx is clear.  Eyes:     Extraocular Movements: Extraocular movements intact.     Conjunctiva/sclera: Conjunctivae normal.     Pupils: Pupils are equal, round, and reactive to light.  Cardiovascular:     Rate and Rhythm: Normal rate and regular rhythm.     Pulses: Normal pulses.     Heart sounds: Normal heart sounds.  Pulmonary:     Effort: Pulmonary effort is normal.     Breath sounds: Normal breath sounds.  Musculoskeletal:     Cervical back: No tenderness.  Lymphadenopathy:     Cervical: No cervical adenopathy.  Skin:    General: Skin is warm and dry.  Neurological:     General: No focal deficit present.     Mental Status: He is alert and oriented to person, place, and time.  Psychiatric:        Mood and Affect: Mood normal.        Behavior: Behavior normal.    DG Chest 2 View  Result Date: 03/06/2022 CLINICAL DATA:  Persistent cough. EXAM: CHEST - 2 VIEW COMPARISON:  Chest two views 02/05/2022 FINDINGS: Cardiac silhouette and mediastinal contours are within normal limits. Mild calcification within the aortic arch. There is flattening of the diaphragms and mild hyperinflation. The lungs are clear. No pleural effusion or pneumothorax. No acute skeletal abnormality.  IMPRESSION: No active cardiopulmonary disease. Electronically Signed   By: Yvonne Kendall M.D.   On: 03/06/2022 14:28     ASSESSMENT & PLAN: A total of 34 minutes was spent with the patient and counseling/coordination of care regarding preparing for this visit, review of most recent office visit notes, review of most recent blood work results, review of today's chest x-ray report, review of multiple chronic medical conditions under management, review of all medications, diagnosis of laryngitis and treatment, prognosis, documentation, need for follow-up.  Problem List Items Addressed This Visit       Respiratory   Acute laryngitis - Primary    Secondary to viral infection. Recommend to start prednisone 40 mg daily for 5 days Stay well-hydrated       Relevant Medications   predniSONE (DELTASONE) 20 MG tablet   Viral upper respiratory tract infection    Running its course without complications.        Other   Persistent cough    Mucinex DM twice a day for the next 5 to 7 days Hydration.  Cough drops. Chest x-ray done today.      Relevant Medications   dextromethorphan-guaiFENesin (MUCINEX DM) 30-600 MG 12hr tablet   Other Relevant Orders   DG Chest 2 View   Patient Instructions  Laryngitis  Laryngitis is irritation and swelling (inflammation) of your vocal cords. It causes your voice to sound hoarse and may cause you to lose your voice. Depending on the cause, this condition may go away after a short time or may last for more than 3 weeks. Treatment often involves resting your voice and using medicines to soothe your throat. What are the causes? Laryngitis that lasts for a short time may be caused by: An infection caused by a virus. Lots of talking, yelling, or singing. This is also called vocal strain. An infection caused by bacteria. Laryngitis that  lasts for more than 3 weeks can be caused by: Lots of talking, yelling, or singing. An injury to the vocal cords. Acid  reflux. Allergies. Sinus infection. Mucus draining from the nose down the throat (postnasal drip). Smoking. Drinking too much alcohol. Breathing in chemicals or dust. Having growths on the vocal cords. What increases the risk? Smoking. Drinking too much alcohol. Having allergies. Breathing in fumes at work. What are the signs or symptoms? A change in your voice. It may sound low and hoarse. Loss of voice. Dry cough. Sore throat. Dry throat. Stuffy nose. How is this treated? Treatment depends on what is causing the laryngitis. Usually, treatment includes: Resting your voice. Using medicines to soothe your throat. If your laryngitis is caused by an infection from bacteria, you may need to take antibiotics. If your laryngitis is caused by a growth on your vocal cords, you may need to have a surgery to remove it. Follow these instructions at home: Medicines Take over-the-counter and prescription medicines only as told by your doctor. If you were prescribed an antibiotic medicine, take it as told by your doctor. Do not stop taking it even if you start to feel better. Use throat lozenges or sprays to soothe your throat as told by your doctor. General instructions  Talk as little as possible. To do this: Avoid whispering. Write instead of talking. Do this until your voice is back to normal. Rinse your mouth (gargle) with a salt water mixture 3-4 times a day or as needed. To make salt water, dissolve -1 tsp (3-6 g) of salt in 1 cup (237 mL) of warm water. Do not swallow this mixture. Drink enough fluid to keep your pee (urine) pale yellow. Breathe in moist air. Use a humidifier if you live in a dry climate. Do not smoke or use any products that contain nicotine or tobacco. If you need help quitting, ask your doctor. Contact a doctor if: You have a fever. Your pain is worse. Your symptoms do not get better in 2 weeks. Get help right away if: You cough up blood. You have  trouble swallowing. You have trouble breathing. Summary Laryngitis is inflammation of your vocal cords. This condition causes your voice to sound low and hoarse. Rest your voice by talking as little as possible. Also avoid whispering. Get help right away if you have trouble swallowing or breathing or if you cough up blood. This information is not intended to replace advice given to you by your health care provider. Make sure you discuss any questions you have with your health care provider. Document Revised: 06/04/2020 Document Reviewed: 06/04/2020 Elsevier Patient Education  [place], MD [facility]

## 2022-03-06 NOTE — Assessment & Plan Note (Signed)
Secondary to viral infection. Recommend to start prednisone 40 mg daily for 5 days Stay well-hydrated

## 2022-03-06 NOTE — Patient Instructions (Signed)
Laryngitis  Laryngitis is irritation and swelling (inflammation) of your vocal cords. It causes your voice to sound hoarse and may cause you to lose your voice. Depending on the cause, this condition may go away after a short time or may last for more than 3 weeks. Treatment often involves resting your voice and using medicines to soothe your throat. What are the causes? Laryngitis that lasts for a short time may be caused by: An infection caused by a virus. Lots of talking, yelling, or singing. This is also called vocal strain. An infection caused by bacteria. Laryngitis that lasts for more than 3 weeks can be caused by: Lots of talking, yelling, or singing. An injury to the vocal cords. Acid reflux. Allergies. Sinus infection. Mucus draining from the nose down the throat (postnasal drip). Smoking. Drinking too much alcohol. Breathing in chemicals or dust. Having growths on the vocal cords. What increases the risk? Smoking. Drinking too much alcohol. Having allergies. Breathing in fumes at work. What are the signs or symptoms? A change in your voice. It may sound low and hoarse. Loss of voice. Dry cough. Sore throat. Dry throat. Stuffy nose. How is this treated? Treatment depends on what is causing the laryngitis. Usually, treatment includes: Resting your voice. Using medicines to soothe your throat. If your laryngitis is caused by an infection from bacteria, you may need to take antibiotics. If your laryngitis is caused by a growth on your vocal cords, you may need to have a surgery to remove it. Follow these instructions at home: Medicines Take over-the-counter and prescription medicines only as told by your doctor. If you were prescribed an antibiotic medicine, take it as told by your doctor. Do not stop taking it even if you start to feel better. Use throat lozenges or sprays to soothe your throat as told by your doctor. General instructions  Talk as little as  possible. To do this: Avoid whispering. Write instead of talking. Do this until your voice is back to normal. Rinse your mouth (gargle) with a salt water mixture 3-4 times a day or as needed. To make salt water, dissolve -1 tsp (3-6 g) of salt in 1 cup (237 mL) of warm water. Do not swallow this mixture. Drink enough fluid to keep your pee (urine) pale yellow. Breathe in moist air. Use a humidifier if you live in a dry climate. Do not smoke or use any products that contain nicotine or tobacco. If you need help quitting, ask your doctor. Contact a doctor if: You have a fever. Your pain is worse. Your symptoms do not get better in 2 weeks. Get help right away if: You cough up blood. You have trouble swallowing. You have trouble breathing. Summary Laryngitis is inflammation of your vocal cords. This condition causes your voice to sound low and hoarse. Rest your voice by talking as little as possible. Also avoid whispering. Get help right away if you have trouble swallowing or breathing or if you cough up blood. This information is not intended to replace advice given to you by your health care provider. Make sure you discuss any questions you have with your health care provider. Document Revised: 06/04/2020 Document Reviewed: 06/04/2020 Elsevier Patient Education  [place].

## 2022-03-06 NOTE — Assessment & Plan Note (Signed)
Running its course without complications.

## 2022-03-17 ENCOUNTER — Telehealth: Payer: Self-pay | Admitting: Gastroenterology

## 2022-03-17 NOTE — Telephone Encounter (Signed)
Patient called in stating he received a message from our office about a colonoscopy & would like to discuss first if it is necessary given his age. He has been advised that it was previously recommended d/t his history of polyps. OV has been scheduled for 04/09/22 at 11:10 am with Dr. Fuller Plan for patient to discuss concerns further. He was last seen in for OV in 05/2020.

## 2022-03-17 NOTE — Telephone Encounter (Signed)
Patient is calling he has a referral for a colonoscopy but he is wanting to know if it is necessary for him to have one due to his age. Please advise

## 2022-04-09 ENCOUNTER — Encounter: Payer: Self-pay | Admitting: Gastroenterology

## 2022-04-09 ENCOUNTER — Ambulatory Visit (INDEPENDENT_AMBULATORY_CARE_PROVIDER_SITE_OTHER): Payer: Medicare Other | Admitting: Gastroenterology

## 2022-04-09 VITALS — BP 110/60 | HR 86 | Ht 71.0 in | Wt 197.0 lb

## 2022-04-09 DIAGNOSIS — Z86010 Personal history of colonic polyps: Secondary | ICD-10-CM | POA: Diagnosis not present

## 2022-04-09 NOTE — Patient Instructions (Signed)
Please call our office if you decide to schedule a colonoscopy.   The [facility] providers would like to encourage you to use [facility] to communicate with providers for non-urgent requests or questions.  Due to long hold times on the telephone, sending your provider a message by [facility] may be a faster and more efficient way to get a response.  Please allow 48 business hours for a response.  Please remember that this is for non-urgent requests.   Thank you for choosing me and [facility].  Pricilla Riffle. Dagoberto Ligas., MD., [facility] Regal

## 2022-04-09 NOTE — Progress Notes (Signed)
    Assessment     Personal history of multiple adenomatous colon polyps   Recommendations    I recommended against surveillance colonoscopy due to his age however if he felt strongly about proceeding I'm happy to schedule a colonoscopy.  He is uncertain about what he would like to do and wishes to think about it.  He will contact us if he would like to proceed with colonoscopy in the near future.   HPI    This is a 87 year old male returning to discuss personal history of adenomatous colon polyps.  His last office visit was in March 2022 for the same discussion.  His last colonoscopy was performed in January 2019 with 5 subcentimeter tubular adenomas and his prior colonoscopy in November 2016 had 9 subcentimeter tubular adenomas.  He mild stable constipation and no other gastrointestinal complaints.  After our discussion in March 2022 he was interested in proceeding with colonoscopy however that was never completed.  He turns 87 next month.  He wishes to review and discuss discuss the pros and cons of surveillance colonoscopy.  Denies weight loss, abdominal pain, diarrhea, change in stool caliber, melena, hematochezia, nausea, vomiting, dysphagia, reflux symptoms, chest pain.    Labs / Imaging       Latest Ref Rng & Units 11/05/2021    3:50 PM 08/09/2021   12:05 PM 12/12/2020   10:46 AM  Hepatic Function  Total Protein 6.0 - 8.3 g/dL 8.1  7.7  7.6   Albumin 3.5 - 5.2 g/dL 4.5  4.3  4.1   AST 0 - 37 U/L '23  17  17   '$ ALT 0 - 53 U/L '30  20  22   '$ Alk Phosphatase 39 - 117 U/L 51  50  48   Total Bilirubin 0.2 - 1.2 mg/dL 0.4  0.6  0.7   Bilirubin, Direct 0.0 - 0.3 mg/dL  0.1         Latest Ref Rng & Units 11/05/2021    3:50 PM 08/09/2021   12:05 PM 10/25/2015   10:03 AM  CBC  WBC 4.0 - 10.5 K/uL 5.7  5.4  5.0   Hemoglobin 13.0 - 17.0 g/dL 13.9  12.5  12.9   Hematocrit 39.0 - 52.0 % 41.7  37.6  38.3   Platelets 150.0 - 400.0 K/uL 131.0  157.0  186     Current Medications, Allergies,  Past Medical History, Past Surgical History, Family History and Social History were reviewed in [employer] record.   Physical Exam: General: Well developed, well nourished, elderly, no acute distress Head: Normocephalic and atraumatic Eyes: Sclerae anicteric, EOMI Ears: Normal auditory acuity Mouth: No deformities or lesions noted Lungs: Clear throughout to auscultation Heart: Regular rate and rhythm; No murmurs, rubs or bruits Abdomen: Soft, non tender and non distended. No masses, hepatosplenomegaly or hernias noted. Normal Bowel sounds Rectal: Not  done Musculoskeletal: Symmetrical with no gross deformities  Pulses:  Normal pulses noted Extremities: No edema or deformities noted Neurological: Alert oriented x 4, grossly nonfocal Psychological:  Alert and cooperative. Normal mood and affect   Loyed Wilmes T. Fuller Plan, MD 04/09/2022, 11:20 AM

## 2022-04-14 ENCOUNTER — Ambulatory Visit: Payer: Medicare Other | Admitting: Podiatry

## 2022-04-21 ENCOUNTER — Encounter: Payer: Self-pay | Admitting: Emergency Medicine

## 2022-04-21 ENCOUNTER — Ambulatory Visit (INDEPENDENT_AMBULATORY_CARE_PROVIDER_SITE_OTHER): Payer: Medicare Other | Admitting: Emergency Medicine

## 2022-04-21 VITALS — BP 128/74 | HR 82 | Temp 98.1°F | Ht 71.0 in | Wt 192.5 lb

## 2022-04-21 DIAGNOSIS — R0989 Other specified symptoms and signs involving the circulatory and respiratory systems: Secondary | ICD-10-CM

## 2022-04-21 DIAGNOSIS — J22 Unspecified acute lower respiratory infection: Secondary | ICD-10-CM | POA: Diagnosis not present

## 2022-04-21 DIAGNOSIS — R051 Acute cough: Secondary | ICD-10-CM

## 2022-04-21 MED ORDER — DM-GUAIFENESIN ER 30-600 MG PO TB12: 1.0000 | ORAL_TABLET | Freq: Two times a day (BID) | ORAL | 1 refills | Status: DC

## 2022-04-21 MED ORDER — AMOXICILLIN-POT CLAVULANATE 875-125 MG PO TABS: 1.0000 | ORAL_TABLET | Freq: Two times a day (BID) | ORAL | 0 refills | Status: AC

## 2022-04-21 NOTE — Progress Notes (Signed)
Douglas [name] 87 y.o.   Chief Complaint  Patient presents with   Acute Visit    Cough x 2 weeks, scratchy throat     HISTORY OF PRESENT ILLNESS: Acute problem visit today This is a 87 y.o. male complaining of productive cough with scratchy throat and congestion that started 2 weeks ago No other associated symptoms No other complaints or medical concerns today.  HPI   Prior to Admission medications   Medication Sig Start Date End Date Taking? Authorizing Provider  albuterol (VENTOLIN HFA) 108 (90 Base) MCG/ACT inhaler TAKE 2 PUFFS BY MOUTH EVERY 6 HOURS AS NEEDED FOR WHEEZE OR SHORTNESS OF BREATH 06/04/21  Yes Maximiano Coss, NP  cetirizine (ZYRTEC) 10 MG tablet TAKE 1 TABLET BY MOUTH EVERY DAY 09/09/21  Yes Abreanna Drawdy, Ines Bloomer, MD  Cholecalciferol (VITAMIN D) 2000 UNITS tablet Take 2,000 Units by mouth daily.   Yes [provider]  latanoprost (XALATAN) 0.005 % ophthalmic solution Place 1 drop into the left eye at bedtime. 07/14/19  Yes [provider]  Multiple Vitamin (MULTIVITAMIN) tablet Take 1 tablet by mouth daily.   Yes [provider]  pantoprazole (PROTONIX) 40 MG tablet TAKE 1 TABLET BY MOUTH EVERY DAY 03/02/22  Yes Alysha Doolan, [place], MD  RESTASIS 0.05 % ophthalmic emulsion INSTILL 1 DROP INTO BOTH EYES TWICE A DAY 07/13/17  Yes [provider]  simvastatin (ZOCOR) 20 MG tablet TAKE 1 TABLET DAILY AT 6PM. 01/03/22  Yes Vermon Grays, Ines Bloomer, MD  Testosterone 30 MG/ACT SOLN Place 30 g onto the skin.   Yes [provider]    No Known Allergies  Patient Active Problem List   Diagnosis Date Noted   Acute laryngitis 03/06/2022   Viral upper respiratory tract infection 03/06/2022   Lower respiratory infection 02/05/2022   Neuropathic pain of both feet 11/05/2021   Decreased hearing of left ear 11/05/2021   Pedal edema 08/09/2021   Simple chronic bronchitis ([place]) 06/18/2021   Persistent cough 06/18/2021   Diet-controlled  diabetes mellitus ([place]) 09/20/2020   History of prostate cancer 09/20/2020   Atherosclerosis of aorta ([place]) 09/20/2020   B12 deficiency 12/16/2016   Bladder outlet obstruction 04/25/2015   Erectile dysfunction due to arterial insufficiency 04/25/2015   Osteoporosis 04/25/2015   Vitamin D deficiency 04/25/2015   Prostate cancer ([place]) 05/30/2011   Dyslipidemia 05/30/2011   Hypogonadism male 05/30/2011    Past Medical History:  Diagnosis Date   Adenomatous colon polyp 03/2001   Allergy    SEASONAL   Cancer ([place])    Cataract    Dizziness    GERD (gastroesophageal reflux disease)    Hearing loss    Hyperlipidemia    Low testosterone     Past Surgical History:  Procedure Laterality Date   APPENDECTOMY     COLONOSCOPY     EYE SURGERY     POLYPECTOMY     PROSTATE SURGERY     SPINE SURGERY      Social History   Socioeconomic History   Marital status: Married    Spouse name: Not on file   Number of children: 3   Years of education: 2 years [employer]   Highest education level: Not on file  Occupational History   Occupation: RETIRED  Tobacco Use   Smoking status: Never   Smokeless tobacco: Never  Vaping Use   Vaping Use: Never used  Substance and Sexual Activity   Alcohol use: No    Alcohol/week: 0.0 standard drinks of alcohol  Drug use: No   Sexual activity: Yes  Other Topics Concern   Not on file  Social History Narrative   Married. Education: [employer]. Exercise: Yes.   Right-handed.   Drinks coffee daily.   Lives at home with his wife.   Social Determinants of Health   Financial Resource Strain: Low Risk  (07/12/2021)   Overall Financial Resource Strain (CARDIA)    Difficulty of Paying Living Expenses: Not hard at all  Food Insecurity: No Food Insecurity (07/12/2021)   Hunger Vital Sign    Worried About Running Out of Food in the Last Year: Never true    Ran Out of Food in the Last Year: Never true  Transportation Needs: No Transportation Needs (07/12/2021)    PRAPARE - Hydrologist (Medical): No    Lack of Transportation (Non-Medical): No  Physical Activity: Sufficiently Active (07/12/2021)   Exercise Vital Sign    Days of Exercise per Week: 3 days    Minutes of Exercise per Session: 50 min  Stress: No Stress Concern Present (07/12/2021)   [place]    Feeling of Stress : Not at all  Social Connections: [place] (07/12/2021)   Social Connection and Isolation Panel [NHANES]    Frequency of Communication with Friends and Family: Twice a week    Frequency of Social Gatherings with Friends and Family: Twice a week    Attends Religious Services: 1 to 4 times per year    Active Member of [employer] or Organizations: Yes    Attends Archivist Meetings: 1 to 4 times per year    Marital Status: Married  Human resources officer Violence: Not At Risk (07/12/2021)   Humiliation, Afraid, Rape, and Kick questionnaire    Fear of Current or Ex-Partner: No    Emotionally Abused: No    Physically Abused: No    Sexually Abused: No    Family History  Problem Relation Age of Onset   Cancer Father 77       pancreatic   Other Mother        age 66   Prostate cancer Brother    Colon cancer Neg Hx    Esophageal cancer Neg Hx    Liver cancer Neg Hx    Pancreatic cancer Neg Hx    Rectal cancer Neg Hx    Stomach cancer Neg Hx      Review of Systems  Constitutional: Negative.  Negative for chills and fever.  [name]:  Positive for congestion and sore throat.   Respiratory:  Positive for cough and sputum production.   Cardiovascular: Negative.  Negative for chest pain and palpitations.  Gastrointestinal:  Negative for abdominal pain, diarrhea, nausea and vomiting.  Genitourinary: Negative.  Negative for dysuria and hematuria.  Skin: Negative.  Negative for rash.  Neurological: Negative.  Negative for dizziness and headaches.  All other systems reviewed and  are negative.  Today's Vitals   04/21/22 1030  BP: 128/74  Pulse: 82  Temp: 98.1 F (36.7 C)  TempSrc: Oral  SpO2: 94%  Weight: 192 lb 8 oz (87.3 kg)  Height: '5\' 11"'$  (1.803 m)   Body mass index is 26.85 kg/m.   Physical Exam Vitals reviewed.  Constitutional:      Appearance: Normal appearance.  [name]:     Head: Normocephalic.     Right Ear: Tympanic membrane, ear canal and external ear normal.     Left Ear: Tympanic membrane,  ear canal and external ear normal.     Mouth/Throat:     Mouth: Mucous membranes are moist.     Pharynx: Oropharynx is clear.  Eyes:     Extraocular Movements: Extraocular movements intact.     Conjunctiva/sclera: Conjunctivae normal.     Pupils: Pupils are equal, round, and reactive to light.  Cardiovascular:     Rate and Rhythm: Normal rate and regular rhythm.     Pulses: Normal pulses.     Heart sounds: Normal heart sounds.  Pulmonary:     Effort: Pulmonary effort is normal.     Breath sounds: Normal breath sounds.  Musculoskeletal:     Cervical back: No tenderness.  Lymphadenopathy:     Cervical: No cervical adenopathy.  Skin:    General: Skin is warm and dry.  Neurological:     General: No focal deficit present.     Mental Status: He is alert and oriented to person, place, and time.  Psychiatric:        Mood and Affect: Mood normal.        Behavior: Behavior normal.      ASSESSMENT & PLAN: A total of 34 minutes was spent with the patient and counseling/coordination of care regarding preparing for this visit, review of most recent office visit notes, review of chronic medical conditions under management, review of all medications, diagnosis of lower respiratory infection and need for antibiotics, cough management, prognosis, documentation, and need for follow-up if no better or worse during the next several days.  Problem List Items Addressed This Visit       Respiratory   Lower respiratory infection - Primary    Viral  respiratory infection now with secondary bacterial infection. Will benefit from Augmentin 875 mg twice a day for 7 days No signs of pneumonia or respiratory compromise Clinically stable.  No red flag signs or symptoms      Relevant Medications   amoxicillin-clavulanate (AUGMENTIN) 875-125 MG tablet   Chest congestion    Advised to rest and stay well-hydrated May take over-the-counter Mucinex DM twice a day as needed      Relevant Medications   dextromethorphan-guaiFENesin (MUCINEX DM) 30-600 MG 12hr tablet     Other   Acute cough    Start over-the-counter Mucinex DM twice a day with cough drops Advised to stay well-hydrated and rest Cough management discussed      Relevant Medications   dextromethorphan-guaiFENesin (MUCINEX DM) 30-600 MG 12hr tablet   Patient Instructions  Upper Respiratory Infection, Adult An upper respiratory infection (URI) affects the nose, throat, and upper airways that lead to the lungs. The most common type of URI is often called the common cold. URIs usually get better on their own, without medical treatment. What are the causes? A URI is caused by a germ (virus). You may catch these germs by: Breathing in droplets from an infected person's cough or sneeze. Touching something that has the germ on it (is contaminated) and then touching your mouth, nose, or eyes. What increases the risk? You are more likely to get a URI if: You are very young or very old. You have close contact with others, such as at work, school, or a health care facility. You smoke. You have long-term (chronic) heart or lung disease. You have a weakened disease-fighting system (immune system). You have nasal allergies or asthma. You have a lot of stress. You have poor nutrition. What are the signs or symptoms? Runny or stuffy (congested) nose. Cough.  Sneezing. Sore throat. Headache. Feeling tired (fatigue). Fever. Not wanting to eat as much as usual. Pain in your forehead,  behind your eyes, and over your cheekbones (sinus pain). Muscle aches. Redness or irritation of the eyes. Pressure in the ears or face. How is this treated? URIs usually get better on their own within 7-10 days. Medicines cannot cure URIs, but your doctor may recommend certain medicines to help relieve symptoms, such as: Over-the-counter cold medicines. Medicines to reduce coughing (cough suppressants). Coughing is a type of defense against infection that helps to clear the nose, throat, windpipe, and lungs (respiratory system). Take these medicines only as told by your doctor. Medicines to lower your fever. Follow these instructions at home: Activity Rest as needed. If you have a fever, stay home from work or school until your fever is gone, or until your doctor says you may return to work or school. You should stay home until you cannot spread the infection anymore (you are not contagious). Your doctor may have you wear a face mask so you have less risk of spreading the infection. Relieving symptoms Rinse your mouth often with salt water. To make salt water, dissolve -1 tsp (3-6 g) of salt in 1 cup (237 mL) of warm water. Use a cool-mist humidifier to add moisture to the air. This can help you breathe more easily. Eating and drinking  Drink enough fluid to keep your pee (urine) pale yellow. Eat soups and other clear broths. General instructions  Take over-the-counter and prescription medicines only as told by your doctor. Do not smoke or use any products that contain nicotine or tobacco. If you need help quitting, ask your doctor. Avoid being where people are smoking (avoid secondhand smoke). Stay up to date on all your shots (immunizations), and get the flu shot every year. Keep all follow-up visits. How to prevent the spread of infection to others  Wash your hands with soap and water for at least 20 seconds. If you cannot use soap and water, use hand sanitizer. Avoid touching  your mouth, face, eyes, or nose. Cough or sneeze into a tissue or your sleeve or elbow. Do not cough or sneeze into your hand or into the air. Contact a doctor if: You are getting worse, not better. You have any of these: A fever or chills. Brown or red mucus in your nose. Yellow or brown fluid (discharge)coming from your nose. Pain in your face, especially when you bend forward. Swollen neck glands. Pain when you swallow. White areas in the back of your throat. Get help right away if: You have shortness of breath that gets worse. You have very bad or constant: Headache. Ear pain. Pain in your forehead, behind your eyes, and over your cheekbones (sinus pain). Chest pain. You have long-lasting (chronic) lung disease along with any of these: Making high-pitched whistling sounds when you breathe, most often when you breathe out (wheezing). Long-lasting cough (more than 14 days). Coughing up blood. A change in your usual mucus. You have a stiff neck. You have changes in your: Vision. Hearing. Thinking. Mood. These symptoms may be an emergency. Get help right away. Call 911. Do not wait to see if the symptoms will go away. Do not drive yourself to the hospital. Summary An upper respiratory infection (URI) is caused by a germ (virus). The most common type of URI is often called the common cold. URIs usually get better within 7-10 days. Take over-the-counter and prescription medicines only as told by your  doctor. This information is not intended to replace advice given to you by your health care provider. Make sure you discuss any questions you have with your health care provider. Document Revised: 10/17/2020 Document Reviewed: 10/17/2020 Elsevier Patient Education  Murtaugh, MD [facility]

## 2022-04-21 NOTE — Assessment & Plan Note (Signed)
Advised to rest and stay well-hydrated May take over-the-counter Mucinex DM twice a day as needed

## 2022-04-21 NOTE — Patient Instructions (Signed)

## 2022-04-21 NOTE — Assessment & Plan Note (Signed)
Viral respiratory infection now with secondary bacterial infection. Will benefit from Augmentin 875 mg twice a day for 7 days No signs of pneumonia or respiratory compromise Clinically stable.  No red flag signs or symptoms

## 2022-04-21 NOTE — Assessment & Plan Note (Signed)
Start over-the-counter Mucinex DM twice a day with cough drops Advised to stay well-hydrated and rest Cough management discussed

## 2022-05-06 ENCOUNTER — Ambulatory Visit (INDEPENDENT_AMBULATORY_CARE_PROVIDER_SITE_OTHER): Payer: Medicare Other

## 2022-05-06 ENCOUNTER — Encounter: Payer: Self-pay | Admitting: Emergency Medicine

## 2022-05-06 ENCOUNTER — Ambulatory Visit (INDEPENDENT_AMBULATORY_CARE_PROVIDER_SITE_OTHER): Payer: Medicare Other | Admitting: Emergency Medicine

## 2022-05-06 VITALS — BP 148/76 | HR 76 | Temp 98.1°F | Ht 71.0 in | Wt 185.4 lb

## 2022-05-06 DIAGNOSIS — C61 Malignant neoplasm of prostate: Secondary | ICD-10-CM

## 2022-05-06 DIAGNOSIS — I700 Atherosclerosis of aorta: Secondary | ICD-10-CM

## 2022-05-06 DIAGNOSIS — R5383 Other fatigue: Secondary | ICD-10-CM

## 2022-05-06 DIAGNOSIS — J069 Acute upper respiratory infection, unspecified: Secondary | ICD-10-CM | POA: Diagnosis not present

## 2022-05-06 DIAGNOSIS — E119 Type 2 diabetes mellitus without complications: Secondary | ICD-10-CM

## 2022-05-06 DIAGNOSIS — E538 Deficiency of other specified B group vitamins: Secondary | ICD-10-CM

## 2022-05-06 DIAGNOSIS — R634 Abnormal weight loss: Secondary | ICD-10-CM

## 2022-05-06 DIAGNOSIS — E785 Hyperlipidemia, unspecified: Secondary | ICD-10-CM | POA: Diagnosis not present

## 2022-05-06 DIAGNOSIS — R531 Weakness: Secondary | ICD-10-CM | POA: Diagnosis not present

## 2022-05-06 LAB — MICROALBUMIN / CREATININE URINE RATIO
Creatinine,U: 51.2 mg/dL
Microalb Creat Ratio: 3.6 mg/g (ref 0.0–30.0)
Microalb, Ur: 1.8 mg/dL (ref 0.0–1.9)

## 2022-05-06 LAB — CBC WITH DIFFERENTIAL/PLATELET
Basophils Absolute: 0 10*3/uL (ref 0.0–0.1)
Basophils Relative: 0.5 % (ref 0.0–3.0)
Eosinophils Absolute: 0.1 10*3/uL (ref 0.0–0.7)
Eosinophils Relative: 1 % (ref 0.0–5.0)
HCT: 38.5 % — ABNORMAL LOW (ref 39.0–52.0)
Hemoglobin: 13 g/dL (ref 13.0–17.0)
Lymphocytes Relative: 44.3 % (ref 12.0–46.0)
Lymphs Abs: 2.9 10*3/uL (ref 0.7–4.0)
MCHC: 33.8 g/dL (ref 30.0–36.0)
MCV: 94.9 fl (ref 78.0–100.0)
Monocytes Absolute: 0.5 10*3/uL (ref 0.1–1.0)
Monocytes Relative: 7.7 % (ref 3.0–12.0)
Neutro Abs: 3 10*3/uL (ref 1.4–7.7)
Neutrophils Relative %: 46.5 % (ref 43.0–77.0)
Platelets: 188 10*3/uL (ref 150.0–400.0)
RBC: 4.06 Mil/uL — ABNORMAL LOW (ref 4.22–5.81)
RDW: 12.8 % (ref 11.5–15.5)
WBC: 6.5 10*3/uL (ref 4.0–10.5)

## 2022-05-06 LAB — URINALYSIS
Bilirubin Urine: NEGATIVE
Hgb urine dipstick: NEGATIVE
Ketones, ur: 15 — AB
Leukocytes,Ua: NEGATIVE
Nitrite: NEGATIVE
Specific Gravity, Urine: 1.015 (ref 1.000–1.030)
Total Protein, Urine: NEGATIVE
Urine Glucose: >=1000 — AB
Urobilinogen, UA: 0.2 (ref 0.0–1.0)
pH: 6 (ref 5.0–8.0)

## 2022-05-06 LAB — HEMOGLOBIN A1C: Hgb A1c MFr Bld: 14.5 % — ABNORMAL HIGH (ref 4.6–6.5)

## 2022-05-06 LAB — COMPREHENSIVE METABOLIC PANEL
ALT: 26 U/L (ref 0–53)
AST: 18 U/L (ref 0–37)
Albumin: 4.6 g/dL (ref 3.5–5.2)
Alkaline Phosphatase: 64 U/L (ref 39–117)
BUN: 21 mg/dL (ref 6–23)
CO2: 29 mEq/L (ref 19–32)
Calcium: 10.3 mg/dL (ref 8.4–10.5)
Chloride: 92 mEq/L — ABNORMAL LOW (ref 96–112)
Creatinine, Ser: 1.29 mg/dL (ref 0.40–1.50)
GFR: 50.76 mL/min — ABNORMAL LOW (ref >60.00)
Glucose, Bld: 502 mg/dL (ref 70–99)
Potassium: 5.2 mEq/L — ABNORMAL HIGH (ref 3.5–5.1)
Sodium: 133 mEq/L — ABNORMAL LOW (ref 135–145)
Total Bilirubin: 0.7 mg/dL (ref 0.2–1.2)
Total Protein: 8 g/dL (ref 6.0–8.3)

## 2022-05-06 LAB — LIPID PANEL
Cholesterol: 158 mg/dL (ref 0–200)
HDL: 32.3 mg/dL — ABNORMAL LOW (ref >39.00)
Total CHOL/HDL Ratio: 5
Triglycerides: 583 mg/dL — ABNORMAL HIGH (ref 0.0–149.0)

## 2022-05-06 LAB — PSA: PSA: 1.39 ng/mL (ref 0.10–4.00)

## 2022-05-06 LAB — LDL CHOLESTEROL, DIRECT: Direct LDL: 43 mg/dL

## 2022-05-06 LAB — TSH: TSH: 0.78 u[IU]/mL (ref 0.35–5.50)

## 2022-05-06 LAB — VITAMIN B12: Vitamin B-12: 1209 pg/mL — ABNORMAL HIGH (ref 211–911)

## 2022-05-06 NOTE — Assessment & Plan Note (Signed)
Most likely out-of-control and creating symptoms of polyuria and increased thirst. Blood work done today. On no medications at present time.

## 2022-05-06 NOTE — Progress Notes (Signed)
Douglas [name] 87 y.o.   Chief Complaint  Patient presents with   Follow-up    F/u appt, patient has fallen 3 times and has no appetite, constant urination at night.   Patient concern about weight loss and his balance is off     HISTORY OF PRESENT ILLNESS: This is a 87 y.o. male complaining of several things today: 1.  Frequent falls at night when he gets up to pee.  States he starts to urinate and starts feeling lightheaded making him lose his balance.  Does not happen during the day 2.  Thirsty all the time 3.  Problems with bladder control.  Has history of prostate cancer 4.  Staying sleepy all the time 5.  Tired all the time Recent upper respiratory tract infection.  Still lingering. No other complaints or medical concerns today. Wt Readings from Last 3 Encounters:  05/06/22 185 lb 6 oz (84.1 kg)  04/21/22 192 lb 8 oz (87.3 kg)  04/09/22 197 lb (89.4 kg)     HPI   Prior to Admission medications   Medication Sig Start Date End Date Taking? Authorizing Provider  albuterol (VENTOLIN HFA) 108 (90 Base) MCG/ACT inhaler TAKE 2 PUFFS BY MOUTH EVERY 6 HOURS AS NEEDED FOR WHEEZE OR SHORTNESS OF BREATH 06/04/21  Yes Maximiano Coss, NP  cetirizine (ZYRTEC) 10 MG tablet TAKE 1 TABLET BY MOUTH EVERY DAY 09/09/21  Yes Aashi Derrington, Ines Bloomer, MD  Cholecalciferol (VITAMIN D) 2000 UNITS tablet Take 2,000 Units by mouth daily.   Yes [provider]  latanoprost (XALATAN) 0.005 % ophthalmic solution Place 1 drop into the left eye at bedtime. 07/14/19  Yes [provider]  Multiple Vitamin (MULTIVITAMIN) tablet Take 1 tablet by mouth daily.   Yes [provider]  pantoprazole (PROTONIX) 40 MG tablet TAKE 1 TABLET BY MOUTH EVERY DAY 03/02/22  Yes Kasey Ewings, [place], MD  RESTASIS 0.05 % ophthalmic emulsion INSTILL 1 DROP INTO BOTH EYES TWICE A DAY 07/13/17  Yes [provider]  simvastatin (ZOCOR) 20 MG tablet TAKE 1 TABLET DAILY AT 6PM. 01/03/22  Yes Meryl Ponder,  Ines Bloomer, MD  Testosterone 30 MG/ACT SOLN Place 30 g onto the skin. Patient not taking: Reported on 05/06/2022    [provider]    No Known Allergies  Patient Active Problem List   Diagnosis Date Noted   Neuropathic pain of both feet 11/05/2021   Decreased hearing of left ear 11/05/2021   Pedal edema 08/09/2021   Simple chronic bronchitis ([place]) 06/18/2021   Diet-controlled diabetes mellitus ([place]) 09/20/2020   History of prostate cancer 09/20/2020   Atherosclerosis of aorta ([place]) 09/20/2020   B12 deficiency 12/16/2016   Bladder outlet obstruction 04/25/2015   Erectile dysfunction due to arterial insufficiency 04/25/2015   Osteoporosis 04/25/2015   Vitamin D deficiency 04/25/2015   Prostate cancer ([place]) 05/30/2011   Dyslipidemia 05/30/2011   Hypogonadism male 05/30/2011    Past Medical History:  Diagnosis Date   Adenomatous colon polyp 03/2001   Allergy    SEASONAL   Cancer ([place])    Cataract    Dizziness    GERD (gastroesophageal reflux disease)    Hearing loss    Hyperlipidemia    Low testosterone     Past Surgical History:  Procedure Laterality Date   APPENDECTOMY     COLONOSCOPY     EYE SURGERY     POLYPECTOMY     PROSTATE SURGERY     SPINE SURGERY  Social History   Socioeconomic History   Marital status: Married    Spouse name: Not on file   Number of children: 3   Years of education: 2 years [employer]   Highest education level: Not on file  Occupational History   Occupation: RETIRED  Tobacco Use   Smoking status: Never   Smokeless tobacco: Never  Vaping Use   Vaping Use: Never used  Substance and Sexual Activity   Alcohol use: No    Alcohol/week: 0.0 standard drinks of alcohol   Drug use: No   Sexual activity: Yes  Other Topics Concern   Not on file  Social History Narrative   Married. Education: [employer]. Exercise: Yes.   Right-handed.   Drinks coffee daily.   Lives at home with his wife.   Social Determinants of Health    Financial Resource Strain: Low Risk  (07/12/2021)   Overall Financial Resource Strain (CARDIA)    Difficulty of Paying Living Expenses: Not hard at all  Food Insecurity: No Food Insecurity (07/12/2021)   Hunger Vital Sign    Worried About Running Out of Food in the Last Year: Never true    Ran Out of Food in the Last Year: Never true  Transportation Needs: No Transportation Needs (07/12/2021)   PRAPARE - Hydrologist (Medical): No    Lack of Transportation (Non-Medical): No  Physical Activity: Sufficiently Active (07/12/2021)   Exercise Vital Sign    Days of Exercise per Week: 3 days    Minutes of Exercise per Session: 50 min  Stress: No Stress Concern Present (07/12/2021)   [place]    Feeling of Stress : Not at all  Social Connections: [place] (07/12/2021)   Social Connection and Isolation Panel [NHANES]    Frequency of Communication with Friends and Family: Twice a week    Frequency of Social Gatherings with Friends and Family: Twice a week    Attends Religious Services: 1 to 4 times per year    Active Member of [employer] or Organizations: Yes    Attends Archivist Meetings: 1 to 4 times per year    Marital Status: Married  Human resources officer Violence: Not At Risk (07/12/2021)   Humiliation, Afraid, Rape, and Kick questionnaire    Fear of Current or Ex-Partner: No    Emotionally Abused: No    Physically Abused: No    Sexually Abused: No    Family History  Problem Relation Age of Onset   Cancer Father 74       pancreatic   Other Mother        age 28   Prostate cancer Brother    Colon cancer Neg Hx    Esophageal cancer Neg Hx    Liver cancer Neg Hx    Pancreatic cancer Neg Hx    Rectal cancer Neg Hx    Stomach cancer Neg Hx      Review of Systems  Constitutional:  Positive for weight loss. Negative for chills and fever.  [name]:  Positive for congestion and  sore throat.   Respiratory: Negative.  Negative for cough, hemoptysis and shortness of breath.   Cardiovascular: Negative.  Negative for chest pain and palpitations.  Gastrointestinal:  Negative for abdominal pain, diarrhea, nausea and vomiting.  Genitourinary: Negative.  Negative for dysuria and hematuria.  Skin: Negative.  Negative for rash.  Neurological: Negative.  Negative for dizziness and headaches.  All other systems  reviewed and are negative.   Today's Vitals   05/06/22 1335  BP: (!) 150/76  Pulse: 76  Temp: 98.1 F (36.7 C)  TempSrc: Oral  SpO2: 97%  Weight: 185 lb 6 oz (84.1 kg)  Height: '5\' 11"'$  (1.803 m)   Body mass index is 25.85 kg/m.  Physical Exam Vitals reviewed.  Constitutional:      Appearance: Normal appearance.  [name]:     Head: Normocephalic.  Eyes:     Extraocular Movements: Extraocular movements intact.     Conjunctiva/sclera: Conjunctivae normal.     Pupils: Pupils are equal, round, and reactive to light.  Cardiovascular:     Rate and Rhythm: Normal rate and regular rhythm.     Pulses: Normal pulses.     Heart sounds: Normal heart sounds.  Pulmonary:     Effort: Pulmonary effort is normal.     Breath sounds: Normal breath sounds.  Abdominal:     Palpations: Abdomen is soft.     Tenderness: There is no abdominal tenderness.  Musculoskeletal:     Cervical back: No tenderness.  Lymphadenopathy:     Cervical: No cervical adenopathy.  Skin:    General: Skin is warm and dry.  Neurological:     General: No focal deficit present.     Mental Status: He is alert and oriented to person, place, and time.  Psychiatric:        Mood and Affect: Mood normal.        Behavior: Behavior normal.    DG Chest 2 View  Result Date: 05/06/2022 CLINICAL DATA:  Reason for exam: weight loss - 20 lbs over the last month. Patient c/o weakness and fatigue x 1 month. Nonsmoker. EXAM: CHEST - 2 VIEW COMPARISON:  03/06/2022 FINDINGS: Lungs are clear. Heart size and  mediastinal contours are within normal limits. Aortic Atherosclerosis (ICD10-170.0). No effusion. Visualized bones unremarkable. IMPRESSION: No acute cardiopulmonary disease. Electronically Signed   By: Lucrezia Europe M.D.   On: 05/06/2022 14:28     ASSESSMENT & PLAN: A total of 48 minutes was spent with the patient and counseling/coordination of care regarding preparing for this visit, review of most recent office visit notes, review of multiple chronic medical conditions and their management, review of all medications, review of most recent blood work results, review of today's chest x-ray report, differential diagnosis of tiredness with weight loss and need for workup, education on nutrition, prognosis, documentation, and need for follow-up.  Problem List Items Addressed This Visit       Cardiovascular and Mediastinum   Atherosclerosis of aorta (HCC)    Stable.  Diet and nutrition discussed. Continue simvastatin 20 mg daily.        Respiratory   Viral upper respiratory tract infection    Still active but running its course without complications.      Relevant Orders   DG Chest 2 View (Completed)     Endocrine   Diet-controlled diabetes mellitus ([place])    Most likely out-of-control and creating symptoms of polyuria and increased thirst. Blood work done today. On no medications at present time.      Relevant Orders   Urine Microalbumin w/creat. ratio   Comprehensive metabolic panel   Hemoglobin A1c     Genitourinary   Prostate cancer (H[facility])    Stable.  PSA done today. Contributing to urinary symptoms. Nuclear medicine scan done last July as follows: MPRESSION: 1. Focal activity in the LEFT lobe of the prostate gland could represent local  prostate cancer recurrence.   2. No evidence metastatic adenopathy in the pelvis or periaortic retroperitoneum. 3. No evidence of visceral metastasis or skeletal metastasis.     Electronically Signed   By: Suzy Bouchard M.D.   On:  10/10/2021 11:58      Relevant Orders   PSA     Other   Dyslipidemia    Stable.  Diet and nutrition discussed.  Continue simvastatin 20 mg daily.      Relevant Orders   Comprehensive metabolic panel   Lipid panel   B12 deficiency   Relevant Orders   Vitamin B12   Tiredness - Primary    Differential diagnosis discussed.  Needs blood work today. Multifactorial.      Relevant Orders   Urinalysis   CBC with Differential/Platelet   Comprehensive metabolic panel   TSH   Weight loss    Multifactorial.  Has diagnosis of prostate cancer. Blood work done today. Negative chest x-ray for malignancy or pleural effusions.      Relevant Orders   DG Chest 2 View (Completed)   Patient Instructions  Health Maintenance After Age 81 After age 14, you are at a higher risk for certain long-term diseases and infections as well as injuries from falls. Falls are a major cause of broken bones and head injuries in people who are older than age 28. Getting regular preventive care can help to keep you healthy and well. Preventive care includes getting regular testing and making lifestyle changes as recommended by your health care provider. Talk with your health care provider about: Which screenings and tests you should have. A screening is a test that checks for a disease when you have no symptoms. A diet and exercise plan that is right for you. What should I know about screenings and tests to prevent falls? Screening and testing are the best ways to find a health problem early. Early diagnosis and treatment give you the best chance of managing medical conditions that are common after age 10. Certain conditions and lifestyle choices may make you more likely to have a fall. Your health care provider may recommend: Regular vision checks. Poor vision and conditions such as cataracts can make you more likely to have a fall. If you wear glasses, make sure to get your prescription updated if your vision  changes. Medicine review. Work with your health care provider to regularly review all of the medicines you are taking, including over-the-counter medicines. Ask your health care provider about any side effects that may make you more likely to have a fall. Tell your health care provider if any medicines that you take make you feel dizzy or sleepy. Strength and balance checks. Your health care provider may recommend certain tests to check your strength and balance while standing, walking, or changing positions. Foot health exam. Foot pain and numbness, as well as not wearing proper footwear, can make you more likely to have a fall. Screenings, including: Osteoporosis screening. Osteoporosis is a condition that causes the bones to get weaker and break more easily. Blood pressure screening. Blood pressure changes and medicines to control blood pressure can make you feel dizzy. Depression screening. You may be more likely to have a fall if you have a fear of falling, feel depressed, or feel unable to do activities that you used to do. Alcohol use screening. Using too much alcohol can affect your balance and may make you more likely to have a fall. Follow these instructions at home: Lifestyle Do  not drink alcohol if: Your health care provider tells you not to drink. If you drink alcohol: Limit how much you have to: 0-1 drink a day for women. 0-2 drinks a day for men. Know how much alcohol is in your drink. In the [place] one drink equals one 12 oz bottle of beer (355 mL), one 5 oz glass of wine (148 mL), or one 1 oz glass of hard liquor (44 mL). Do not use any products that contain nicotine or tobacco. These products include cigarettes, chewing tobacco, and vaping devices, such as e-cigarettes. If you need help quitting, ask your health care provider. Activity  Follow a regular exercise program to stay fit. This will help you maintain your balance. Ask your health care provider what types of exercise  are appropriate for you. If you need a cane or walker, use it as recommended by your health care provider. Wear supportive shoes that have nonskid soles. Safety  Remove any tripping hazards, such as rugs, cords, and clutter. Install safety equipment such as grab bars in bathrooms and safety rails on stairs. Keep rooms and walkways well-lit. General instructions Talk with your health care provider about your risks for falling. Tell your health care provider if: You fall. Be sure to tell your health care provider about all falls, even ones that seem minor. You feel dizzy, tiredness (fatigue), or off-balance. Take over-the-counter and prescription medicines only as told by your health care provider. These include supplements. Eat a healthy diet and maintain a healthy weight. A healthy diet includes low-fat dairy products, low-fat (lean) meats, and fiber from whole grains, beans, and lots of fruits and vegetables. Stay current with your vaccines. Schedule regular health, dental, and eye exams. Summary Having a healthy lifestyle and getting preventive care can help to protect your health and wellness after age 24. Screening and testing are the best way to find a health problem early and help you avoid having a fall. Early diagnosis and treatment give you the best chance for managing medical conditions that are more common for people who are older than age 25. Falls are a major cause of broken bones and head injuries in people who are older than age 77. Take precautions to prevent a fall at home. Work with your health care provider to learn what changes you can make to improve your health and wellness and to prevent falls. This information is not intended to replace advice given to you by your health care provider. Make sure you discuss any questions you have with your health care provider. Document Revised: 08/06/2020 Document Reviewed: 08/06/2020 Elsevier Patient Education  [place], MD [facility]

## 2022-05-06 NOTE — Assessment & Plan Note (Signed)
Multifactorial.  Has diagnosis of prostate cancer. Blood work done today. Negative chest x-ray for malignancy or pleural effusions.

## 2022-05-06 NOTE — Assessment & Plan Note (Signed)
Differential diagnosis discussed.  Needs blood work today. Multifactorial.

## 2022-05-06 NOTE — Patient Instructions (Signed)
Health Maintenance After Age 85 After age 85, you are at a higher risk for certain long-term diseases and infections as well as injuries from falls. Falls are a major cause of broken bones and head injuries in people who are older than age 85. Getting regular preventive care can help to keep you healthy and well. Preventive care includes getting regular testing and making lifestyle changes as recommended by your health care provider. Talk with your health care provider about: Which screenings and tests you should have. A screening is a test that checks for a disease when you have no symptoms. A diet and exercise plan that is right for you. What should I know about screenings and tests to prevent falls? Screening and testing are the best ways to find a health problem early. Early diagnosis and treatment give you the best chance of managing medical conditions that are common after age 85. Certain conditions and lifestyle choices may make you more likely to have a fall. Your health care provider may recommend: Regular vision checks. Poor vision and conditions such as cataracts can make you more likely to have a fall. If you wear glasses, make sure to get your prescription updated if your vision changes. Medicine review. Work with your health care provider to regularly review all of the medicines you are taking, including over-the-counter medicines. Ask your health care provider about any side effects that may make you more likely to have a fall. Tell your health care provider if any medicines that you take make you feel dizzy or sleepy. Strength and balance checks. Your health care provider may recommend certain tests to check your strength and balance while standing, walking, or changing positions. Foot health exam. Foot pain and numbness, as well as not wearing proper footwear, can make you more likely to have a fall. Screenings, including: Osteoporosis screening. Osteoporosis is a condition that causes  the bones to get weaker and break more easily. Blood pressure screening. Blood pressure changes and medicines to control blood pressure can make you feel dizzy. Depression screening. You may be more likely to have a fall if you have a fear of falling, feel depressed, or feel unable to do activities that you used to do. Alcohol use screening. Using too much alcohol can affect your balance and may make you more likely to have a fall. Follow these instructions at home: Lifestyle Do not drink alcohol if: Your health care provider tells you not to drink. If you drink alcohol: Limit how much you have to: 0-1 drink a day for women. 0-2 drinks a day for men. Know how much alcohol is in your drink. In the [place] one drink equals one 12 oz bottle of beer (355 mL), one 5 oz glass of wine (148 mL), or one 1 oz glass of hard liquor (44 mL). Do not use any products that contain nicotine or tobacco. These products include cigarettes, chewing tobacco, and vaping devices, such as e-cigarettes. If you need help quitting, ask your health care provider. Activity  Follow a regular exercise program to stay fit. This will help you maintain your balance. Ask your health care provider what types of exercise are appropriate for you. If you need a cane or walker, use it as recommended by your health care provider. Wear supportive shoes that have nonskid soles. Safety  Remove any tripping hazards, such as rugs, cords, and clutter. Install safety equipment such as grab bars in bathrooms and safety rails on stairs. Keep rooms and walkways   well-lit. General instructions Talk with your health care provider about your risks for falling. Tell your health care provider if: You fall. Be sure to tell your health care provider about all falls, even ones that seem minor. You feel dizzy, tiredness (fatigue), or off-balance. Take over-the-counter and prescription medicines only as told by your health care provider. These include  supplements. Eat a healthy diet and maintain a healthy weight. A healthy diet includes low-fat dairy products, low-fat (lean) meats, and fiber from whole grains, beans, and lots of fruits and vegetables. Stay current with your vaccines. Schedule regular health, dental, and eye exams. Summary Having a healthy lifestyle and getting preventive care can help to protect your health and wellness after age 85. Screening and testing are the best way to find a health problem early and help you avoid having a fall. Early diagnosis and treatment give you the best chance for managing medical conditions that are more common for people who are older than age 85. Falls are a major cause of broken bones and head injuries in people who are older than age 85. Take precautions to prevent a fall at home. Work with your health care provider to learn what changes you can make to improve your health and wellness and to prevent falls. This information is not intended to replace advice given to you by your health care provider. Make sure you discuss any questions you have with your health care provider. Document Revised: 08/06/2020 Document Reviewed: 08/06/2020 Elsevier Patient Education  2023 Elsevier Inc.  

## 2022-05-06 NOTE — Assessment & Plan Note (Signed)
Stable.  Diet and nutrition discussed.  Continue simvastatin 20 mg daily.

## 2022-05-06 NOTE — Assessment & Plan Note (Signed)
Stable.  PSA done today. Contributing to urinary symptoms. Nuclear medicine scan done last July as follows: MPRESSION: 1. Focal activity in the LEFT lobe of the prostate gland could represent local prostate cancer recurrence.   2. No evidence metastatic adenopathy in the pelvis or periaortic retroperitoneum. 3. No evidence of visceral metastasis or skeletal metastasis.     Electronically Signed   By: Suzy Bouchard M.D.   On: 10/10/2021 11:58

## 2022-05-06 NOTE — Assessment & Plan Note (Signed)
Still active but running its course without complications.

## 2022-05-06 NOTE — Assessment & Plan Note (Signed)
Stable.  Diet and nutrition discussed. Continue simvastatin 20 mg daily.

## 2022-05-07 ENCOUNTER — Emergency Department (HOSPITAL_COMMUNITY): Payer: Medicare Other

## 2022-05-07 ENCOUNTER — Observation Stay (HOSPITAL_COMMUNITY): Payer: Medicare Other

## 2022-05-07 ENCOUNTER — Inpatient Hospital Stay (HOSPITAL_COMMUNITY)
Admission: EM | Admit: 2022-05-07 | Discharge: 2022-05-09 | DRG: 639 | Disposition: A | Discharge status: Home Health | Payer: Medicare Other | Source: Ambulatory Visit | Attending: Internal Medicine | Admitting: Internal Medicine

## 2022-05-07 DIAGNOSIS — K5900 Constipation, unspecified: Secondary | ICD-10-CM | POA: Diagnosis present

## 2022-05-07 DIAGNOSIS — Z1152 Encounter for screening for COVID-19: Secondary | ICD-10-CM | POA: Diagnosis not present

## 2022-05-07 DIAGNOSIS — I700 Atherosclerosis of aorta: Secondary | ICD-10-CM | POA: Diagnosis present

## 2022-05-07 DIAGNOSIS — Z86010 Personal history of colonic polyps: Secondary | ICD-10-CM

## 2022-05-07 DIAGNOSIS — R824 Acetonuria: Secondary | ICD-10-CM | POA: Diagnosis present

## 2022-05-07 DIAGNOSIS — E785 Hyperlipidemia, unspecified: Secondary | ICD-10-CM | POA: Diagnosis present

## 2022-05-07 DIAGNOSIS — H9190 Unspecified hearing loss, unspecified ear: Secondary | ICD-10-CM | POA: Diagnosis present

## 2022-05-07 DIAGNOSIS — H04123 Dry eye syndrome of bilateral lacrimal glands: Secondary | ICD-10-CM | POA: Diagnosis not present

## 2022-05-07 DIAGNOSIS — E860 Dehydration: Secondary | ICD-10-CM | POA: Diagnosis present

## 2022-05-07 DIAGNOSIS — H401122 Primary open-angle glaucoma, left eye, moderate stage: Secondary | ICD-10-CM | POA: Diagnosis not present

## 2022-05-07 DIAGNOSIS — Z66 Do not resuscitate: Secondary | ICD-10-CM | POA: Diagnosis present

## 2022-05-07 DIAGNOSIS — E559 Vitamin D deficiency, unspecified: Secondary | ICD-10-CM | POA: Diagnosis present

## 2022-05-07 DIAGNOSIS — E1165 Type 2 diabetes mellitus with hyperglycemia: Principal | ICD-10-CM | POA: Diagnosis present

## 2022-05-07 DIAGNOSIS — C61 Malignant neoplasm of prostate: Secondary | ICD-10-CM | POA: Diagnosis present

## 2022-05-07 DIAGNOSIS — N320 Bladder-neck obstruction: Secondary | ICD-10-CM | POA: Diagnosis present

## 2022-05-07 DIAGNOSIS — R0789 Other chest pain: Secondary | ICD-10-CM | POA: Diagnosis not present

## 2022-05-07 DIAGNOSIS — Z800 Family history of malignant neoplasm of digestive organs: Secondary | ICD-10-CM | POA: Diagnosis not present

## 2022-05-07 DIAGNOSIS — K219 Gastro-esophageal reflux disease without esophagitis: Secondary | ICD-10-CM | POA: Diagnosis present

## 2022-05-07 DIAGNOSIS — R7989 Other specified abnormal findings of blood chemistry: Secondary | ICD-10-CM | POA: Diagnosis present

## 2022-05-07 DIAGNOSIS — R81 Glycosuria: Secondary | ICD-10-CM | POA: Diagnosis present

## 2022-05-07 DIAGNOSIS — R079 Chest pain, unspecified: Secondary | ICD-10-CM | POA: Diagnosis not present

## 2022-05-07 DIAGNOSIS — E538 Deficiency of other specified B group vitamins: Secondary | ICD-10-CM | POA: Diagnosis present

## 2022-05-07 DIAGNOSIS — Z8546 Personal history of malignant neoplasm of prostate: Secondary | ICD-10-CM

## 2022-05-07 DIAGNOSIS — Z8719 Personal history of other diseases of the digestive system: Secondary | ICD-10-CM | POA: Diagnosis not present

## 2022-05-07 DIAGNOSIS — Z79899 Other long term (current) drug therapy: Secondary | ICD-10-CM

## 2022-05-07 DIAGNOSIS — H401111 Primary open-angle glaucoma, right eye, mild stage: Secondary | ICD-10-CM | POA: Diagnosis not present

## 2022-05-07 DIAGNOSIS — Z8042 Family history of malignant neoplasm of prostate: Secondary | ICD-10-CM | POA: Diagnosis not present

## 2022-05-07 DIAGNOSIS — R739 Hyperglycemia, unspecified: Secondary | ICD-10-CM | POA: Diagnosis present

## 2022-05-07 DIAGNOSIS — R55 Syncope and collapse: Secondary | ICD-10-CM | POA: Diagnosis not present

## 2022-05-07 DIAGNOSIS — H409 Unspecified glaucoma: Secondary | ICD-10-CM | POA: Diagnosis present

## 2022-05-07 DIAGNOSIS — E876 Hypokalemia: Secondary | ICD-10-CM | POA: Diagnosis present

## 2022-05-07 LAB — CBC WITH DIFFERENTIAL/PLATELET
Abs Immature Granulocytes: 0.1 10*3/uL — ABNORMAL HIGH (ref 0.00–0.07)
Basophils Absolute: 0 10*3/uL (ref 0.0–0.1)
Basophils Relative: 0 %
Eosinophils Absolute: 0 10*3/uL (ref 0.0–0.5)
Eosinophils Relative: 1 %
HCT: 37.5 % — ABNORMAL LOW (ref 39.0–52.0)
Hemoglobin: 12.8 g/dL — ABNORMAL LOW (ref 13.0–17.0)
Immature Granulocytes: 2 %
Lymphocytes Relative: 50 %
Lymphs Abs: 2.8 10*3/uL (ref 0.7–4.0)
MCH: 31.7 pg (ref 26.0–34.0)
MCHC: 34.1 g/dL (ref 30.0–36.0)
MCV: 92.8 fL (ref 80.0–100.0)
Monocytes Absolute: 0.5 10*3/uL (ref 0.1–1.0)
Monocytes Relative: 10 %
Neutro Abs: 2 10*3/uL (ref 1.7–7.7)
Neutrophils Relative %: 37 %
Platelets: 173 10*3/uL (ref 150–400)
RBC: 4.04 MIL/uL — ABNORMAL LOW (ref 4.22–5.81)
RDW: 11.9 % (ref 11.5–15.5)
WBC: 5.5 10*3/uL (ref 4.0–10.5)
nRBC: 0 % (ref 0.0–0.2)

## 2022-05-07 LAB — BLOOD GAS, VENOUS
Acid-Base Excess: 5.8 mmol/L — ABNORMAL HIGH (ref 0.0–2.0)
Bicarbonate: 31.7 mmol/L — ABNORMAL HIGH (ref 20.0–28.0)
O2 Saturation: 67.5 %
Patient temperature: 37
pCO2, Ven: 50 mmHg (ref 44–60)
pH, Ven: 7.41 (ref 7.25–7.43)
pO2, Ven: 37 mmHg (ref 32–45)

## 2022-05-07 LAB — COMPREHENSIVE METABOLIC PANEL WITH GFR
ALT: 30 U/L (ref 0–44)
AST: 23 U/L (ref 15–41)
Albumin: 4.3 g/dL (ref 3.5–5.0)
Alkaline Phosphatase: 57 U/L (ref 38–126)
Anion gap: 15 (ref 5–15)
BUN: 19 mg/dL (ref 8–23)
CO2: 26 mmol/L (ref 22–32)
Calcium: 10 mg/dL (ref 8.9–10.3)
Chloride: 94 mmol/L — ABNORMAL LOW (ref 98–111)
Creatinine, Ser: 1.26 mg/dL — ABNORMAL HIGH (ref 0.61–1.24)
GFR, Estimated: 56 mL/min — ABNORMAL LOW
Glucose, Bld: 419 mg/dL — ABNORMAL HIGH (ref 70–99)
Potassium: 3.7 mmol/L (ref 3.5–5.1)
Sodium: 135 mmol/L (ref 135–145)
Total Bilirubin: 1.1 mg/dL (ref 0.3–1.2)
Total Protein: 8.4 g/dL — ABNORMAL HIGH (ref 6.5–8.1)

## 2022-05-07 LAB — CBG MONITORING, ED
Glucose-Capillary: 344 mg/dL — ABNORMAL HIGH (ref 70–99)
Glucose-Capillary: 207 mg/dL — ABNORMAL HIGH (ref 70–99)
Glucose-Capillary: 266 mg/dL — ABNORMAL HIGH (ref 70–99)
Glucose-Capillary: 181 mg/dL — ABNORMAL HIGH (ref 70–99)
Glucose-Capillary: 148 mg/dL — ABNORMAL HIGH (ref 70–99)
Glucose-Capillary: 161 mg/dL — ABNORMAL HIGH (ref 70–99)

## 2022-05-07 LAB — TROPONIN I (HIGH SENSITIVITY)
Troponin I (High Sensitivity): 25 ng/L — ABNORMAL HIGH
Troponin I (High Sensitivity): 25 ng/L — ABNORMAL HIGH (ref <18)

## 2022-05-07 LAB — URINALYSIS, ROUTINE W REFLEX MICROSCOPIC
Bilirubin Urine: NEGATIVE
Glucose, UA: >=500 mg/dL — AB
Hgb urine dipstick: NEGATIVE
Ketones, ur: 20 mg/dL — AB
Leukocytes,Ua: NEGATIVE
Nitrite: NEGATIVE
Protein, ur: NEGATIVE mg/dL
Specific Gravity, Urine: 1.03 (ref 1.005–1.030)
pH: 6 (ref 5.0–8.0)

## 2022-05-07 LAB — RESP PANEL BY RT-PCR (RSV, FLU A&B, COVID)  RVPGX2
Influenza A by PCR: NEGATIVE
Influenza B by PCR: NEGATIVE
Resp Syncytial Virus by PCR: NEGATIVE
SARS Coronavirus 2 by RT PCR: NEGATIVE

## 2022-05-07 LAB — LIPASE, BLOOD: Lipase: 46 U/L (ref 11–51)

## 2022-05-07 LAB — LACTIC ACID, PLASMA: Lactic Acid, Venous: 1.6 mmol/L (ref 0.5–1.9)

## 2022-05-07 LAB — BETA-HYDROXYBUTYRIC ACID: Beta-Hydroxybutyric Acid: 1.34 mmol/L — ABNORMAL HIGH (ref 0.05–0.27)

## 2022-05-07 LAB — D-DIMER, QUANTITATIVE: D-Dimer, Quant: 0.5 ug{FEU}/mL (ref 0.00–0.50)

## 2022-05-07 LAB — TSH: TSH: 1.092 u[IU]/mL (ref 0.350–4.500)

## 2022-05-07 LAB — GLUCOSE, CAPILLARY: Glucose-Capillary: 167 mg/dL — ABNORMAL HIGH (ref 70–99)

## 2022-05-07 MED ORDER — INSULIN REGULAR(HUMAN) IN NACL 100-0.9 UT/100ML-% IV SOLN: INTRAVENOUS | Status: DC

## 2022-05-07 MED ORDER — ONDANSETRON HCL 4 MG/2ML IJ SOLN: 4.0000 mg | Freq: Four times a day (QID) | INTRAMUSCULAR | Status: DC | PRN

## 2022-05-07 MED ORDER — ACETAMINOPHEN 650 MG RE SUPP: 650.0000 mg | Freq: Four times a day (QID) | RECTAL | Status: DC | PRN

## 2022-05-07 MED ORDER — LATANOPROST 0.005 % OP SOLN
1.0000 [drp] | Freq: Every day | OPHTHALMIC | Status: DC
  Administered 2022-05-07 – 2022-05-08 (×2): 1 [drp] via OPHTHALMIC
  Filled 2022-05-07: qty 2.5

## 2022-05-07 MED ORDER — DEXTROSE IN LACTATED RINGERS 5 % IV SOLN: INTRAVENOUS | Status: DC

## 2022-05-07 MED ORDER — BRIMONIDINE TARTRATE 0.2 % OP SOLN
1.0000 [drp] | Freq: Two times a day (BID) | OPHTHALMIC | Status: DC
  Administered 2022-05-07 – 2022-05-09 (×4): 1 [drp] via OPHTHALMIC
  Filled 2022-05-07: qty 5

## 2022-05-07 MED ORDER — INSULIN GLARGINE-YFGN 100 UNIT/ML ~~LOC~~ SOLN
8.0000 [IU] | Freq: Every day | SUBCUTANEOUS | Status: DC
  Administered 2022-05-07: 8 [IU] via SUBCUTANEOUS
  Filled 2022-05-07 (×2): qty 0.08

## 2022-05-07 MED ORDER — CYCLOSPORINE 0.05 % OP EMUL
1.0000 [drp] | Freq: Two times a day (BID) | OPHTHALMIC | Status: DC
  Administered 2022-05-07 – 2022-05-09 (×4): 1 [drp] via OPHTHALMIC
  Filled 2022-05-07 (×4): qty 30

## 2022-05-07 MED ORDER — DORZOLAMIDE HCL-TIMOLOL MAL 2-0.5 % OP SOLN
1.0000 [drp] | Freq: Two times a day (BID) | OPHTHALMIC | Status: DC
  Administered 2022-05-07 – 2022-05-09 (×4): 1 [drp] via OPHTHALMIC
  Filled 2022-05-07: qty 10

## 2022-05-07 MED ORDER — INSULIN ASPART 100 UNIT/ML IJ SOLN
0.0000 [IU] | Freq: Three times a day (TID) | INTRAMUSCULAR | Status: DC
  Administered 2022-05-08 (×2): 8 [IU] via SUBCUTANEOUS
  Administered 2022-05-08: 11 [IU] via SUBCUTANEOUS
  Administered 2022-05-09: 8 [IU] via SUBCUTANEOUS

## 2022-05-07 MED ORDER — PANTOPRAZOLE SODIUM 40 MG PO TBEC
40.0000 mg | DELAYED_RELEASE_TABLET | Freq: Every day | ORAL | Status: DC
  Administered 2022-05-07 – 2022-05-09 (×3): 40 mg via ORAL
  Filled 2022-05-07 (×3): qty 1

## 2022-05-07 MED ORDER — INSULIN REGULAR(HUMAN) IN NACL 100-0.9 UT/100ML-% IV SOLN
INTRAVENOUS | Status: DC
  Administered 2022-05-07: 6 [IU]/h via INTRAVENOUS
  Filled 2022-05-07: qty 100

## 2022-05-07 MED ORDER — ASPIRIN 81 MG PO TBEC
81.0000 mg | DELAYED_RELEASE_TABLET | Freq: Every day | ORAL | Status: DC
  Administered 2022-05-07 – 2022-05-08 (×2): 81 mg via ORAL
  Filled 2022-05-07 (×2): qty 1

## 2022-05-07 MED ORDER — ACETAMINOPHEN 325 MG PO TABS: 650.0000 mg | ORAL_TABLET | Freq: Four times a day (QID) | ORAL | Status: DC | PRN

## 2022-05-07 MED ORDER — SODIUM CHLORIDE 0.9 % IV BOLUS
500.0000 mL | Freq: Once | INTRAVENOUS | Status: AC
  Administered 2022-05-07: 500 mL via INTRAVENOUS

## 2022-05-07 MED ORDER — LACTATED RINGERS IV SOLN: INTRAVENOUS | Status: DC

## 2022-05-07 MED ORDER — ALBUTEROL SULFATE (2.5 MG/3ML) 0.083% IN NEBU: 2.5000 mg | INHALATION_SOLUTION | RESPIRATORY_TRACT | Status: DC | PRN

## 2022-05-07 MED ORDER — NETARSUDIL DIMESYLATE 0.02 % OP SOLN
1.0000 [drp] | Freq: Every day | OPHTHALMIC | Status: DC
  Administered 2022-05-08: 1 [drp] via OPHTHALMIC

## 2022-05-07 MED ORDER — INSULIN ASPART 100 UNIT/ML IJ SOLN: 3.0000 [IU] | Freq: Three times a day (TID) | INTRAMUSCULAR | Status: DC

## 2022-05-07 MED ORDER — ALFUZOSIN HCL ER 10 MG PO TB24
10.0000 mg | ORAL_TABLET | Freq: Every day | ORAL | Status: DC
  Administered 2022-05-07 – 2022-05-08 (×2): 10 mg via ORAL
  Filled 2022-05-07 (×2): qty 1

## 2022-05-07 MED ORDER — ONDANSETRON HCL 4 MG PO TABS: 4.0000 mg | ORAL_TABLET | Freq: Four times a day (QID) | ORAL | Status: DC | PRN

## 2022-05-07 MED ORDER — DEXTROSE 50 % IV SOLN: 0.0000 mL | INTRAVENOUS | Status: DC | PRN

## 2022-05-07 MED ORDER — ENOXAPARIN SODIUM 40 MG/0.4ML IJ SOSY
40.0000 mg | PREFILLED_SYRINGE | INTRAMUSCULAR | Status: DC
  Administered 2022-05-07 – 2022-05-08 (×2): 40 mg via SUBCUTANEOUS
  Filled 2022-05-07 (×2): qty 0.4

## 2022-05-07 MED ORDER — SIMVASTATIN 20 MG PO TABS
20.0000 mg | ORAL_TABLET | Freq: Every day | ORAL | Status: DC
  Administered 2022-05-07 – 2022-05-08 (×2): 20 mg via ORAL
  Filled 2022-05-07 (×2): qty 1

## 2022-05-07 NOTE — Progress Notes (Signed)
Carotid artery duplex has been completed. Preliminary results can be found in CV Proc through chart review.   05/07/22 5:04 PM Douglas [name] RVT

## 2022-05-07 NOTE — ED Provider Notes (Signed)
[place] [facility]ERGENCY DEPARTMENT AT [facility] Provider Note   CSN: [id number] Arrival date & time: 05/07/22  1005     History  Chief Complaint  Patient presents with   Abnormal Lab    Douglas [name] is a 87 y.o. male.  The history is provided by the patient, a relative and medical records. No language interpreter was used.  Abnormal Lab Time since result:  Yesterday Patient referred by:  PCP Resulting agency:  Internal Result type: chemistry   Chemistry:    Glucose:  High      Home Medications Prior to Admission medications   Medication Sig Start Date End Date Taking? Authorizing Provider  albuterol (VENTOLIN HFA) 108 (90 Base) MCG/ACT inhaler TAKE 2 PUFFS BY MOUTH EVERY 6 HOURS AS NEEDED FOR WHEEZE OR SHORTNESS OF BREATH 06/04/21   Maximiano Coss, NP  cetirizine (ZYRTEC) 10 MG tablet TAKE 1 TABLET BY MOUTH EVERY DAY 09/09/21   [name], MD  Cholecalciferol (VITAMIN D) 2000 UNITS tablet Take 2,000 Units by mouth daily.    [provider]  latanoprost (XALATAN) 0.005 % ophthalmic solution Place 1 drop into the left eye at bedtime. 07/14/19   [provider]  Multiple Vitamin (MULTIVITAMIN) tablet Take 1 tablet by mouth daily.    [provider]  pantoprazole (PROTONIX) 40 MG tablet TAKE 1 TABLET BY MOUTH EVERY DAY 03/02/22   [name], Ines Bloomer, MD  RESTASIS 0.05 % ophthalmic emulsion INSTILL 1 DROP INTO BOTH EYES TWICE A DAY 07/13/17   [provider]  simvastatin (ZOCOR) 20 MG tablet TAKE 1 TABLET DAILY AT 6PM. 01/03/22   [name], MD  Testosterone 30 MG/ACT SOLN Place 30 g onto the skin. Patient not taking: Reported on 05/06/2022    [provider]      Allergies    Patient has no known allergies.    Review of Systems   Review of Systems  Constitutional:  Positive for fatigue. Negative for chills, diaphoresis and fever.  [name]:  Positive for congestion.   Eyes:  Negative for visual  disturbance.  Respiratory:  Positive for cough and shortness of breath. Negative for chest tightness and wheezing.   Cardiovascular:  Positive for chest pain. Negative for palpitations and leg swelling (resolved).  Gastrointestinal:  Positive for constipation. Negative for abdominal pain, diarrhea, nausea and vomiting.  Endocrine: Positive for polydipsia and polyuria.  Genitourinary:  Positive for frequency and urgency. Negative for dysuria and flank pain.  Musculoskeletal:  Negative for back pain, neck pain and neck stiffness.  Skin:  Negative for rash and wound.  Neurological:  Positive for light-headedness. Negative for headaches.  Psychiatric/Behavioral:  Negative for agitation and confusion.   All other systems reviewed and are negative.   Physical Exam Updated Vital Signs BP (!) 187/94 (BP Location: Left Arm)   Pulse 97   Temp 97.7 F (36.5 C) (Oral)   Resp 16   SpO2 95%  Physical Exam Constitutional:      General: He is not in acute distress.    Appearance: He is well-developed. He is not ill-appearing, toxic-appearing or diaphoretic.  [name]:     Head: Atraumatic.     Right Ear: External ear normal.     Left Ear: External ear normal.     Nose: Nose normal.     Mouth/Throat:     Mouth: Mucous membranes are dry.     Pharynx: No oropharyngeal exudate or posterior oropharyngeal erythema.  Eyes:  Extraocular Movements: Extraocular movements intact.     Conjunctiva/sclera: Conjunctivae normal.     Pupils: Pupils are equal, round, and reactive to light.  Cardiovascular:     Rate and Rhythm: Normal rate.     Heart sounds: No murmur heard. Pulmonary:     Effort: Pulmonary effort is normal. No respiratory distress.     Breath sounds: No stridor. No wheezing, rhonchi or rales.  Chest:     Chest wall: No tenderness.  Abdominal:     General: Abdomen is flat. There is no distension.     Palpations: Abdomen is soft.     Tenderness: There is no abdominal tenderness. There is  no right CVA tenderness, left CVA tenderness, guarding or rebound.  Musculoskeletal:        General: No tenderness.     Cervical back: Normal range of motion and neck supple. No tenderness.     Right lower leg: No edema.     Left lower leg: No edema.  Skin:    General: Skin is warm.     Capillary Refill: Capillary refill takes less than 2 seconds.     Coloration: Skin is not pale.     Findings: No erythema or rash.  Neurological:     Mental Status: He is alert and oriented to person, place, and time.     Cranial Nerves: No cranial nerve deficit.     Sensory: No sensory deficit.     Motor: No weakness or abnormal muscle tone.  Psychiatric:        Mood and Affect: Mood normal.     ED Results / Procedures / Treatments   Labs (all labs ordered are listed, but only abnormal results are displayed) Labs Reviewed  CBC WITH DIFFERENTIAL/PLATELET - Abnormal; Notable for the following components:      Result Value   RBC 4.04 (*)    Hemoglobin 12.8 (*)    HCT 37.5 (*)    Abs Immature Granulocytes 0.10 (*)    All other components within normal limits  COMPREHENSIVE METABOLIC PANEL - Abnormal; Notable for the following components:   Chloride 94 (*)    Glucose, Bld 419 (*)    Creatinine, Ser 1.26 (*)    Total Protein 8.4 (*)    GFR, Estimated 56 (*)    All other components within normal limits  URINALYSIS, ROUTINE W REFLEX MICROSCOPIC - Abnormal; Notable for the following components:   Glucose, UA >=500 (*)    Ketones, ur 20 (*)    Bacteria, UA RARE (*)    All other components within normal limits  BETA-HYDROXYBUTYRIC ACID - Abnormal; Notable for the following components:   Beta-Hydroxybutyric Acid 1.34 (*)    All other components within normal limits  BLOOD GAS, VENOUS - Abnormal; Notable for the following components:   Bicarbonate 31.7 (*)    Acid-Base Excess 5.8 (*)    All other components within normal limits  TROPONIN I (HIGH SENSITIVITY) - Abnormal; Notable for the following  components:   Troponin I (High Sensitivity) 25 (*)    All other components within normal limits  RESP PANEL BY RT-PCR (RSV, FLU A&B, COVID)  RVPGX2  URINE CULTURE  LIPASE, BLOOD  D-DIMER, QUANTITATIVE  LACTIC ACID, PLASMA  TSH  LACTIC ACID, PLASMA  TROPONIN I (HIGH SENSITIVITY)    EKG None  Radiology DG Chest Portable 1 View  Result Date: 05/07/2022 CLINICAL DATA:  Chest pain. EXAM: PORTABLE CHEST 1 VIEW COMPARISON:  05/06/2022. FINDINGS: Clear lungs. Atherosclerotic  calcifications of the aortic arch and tortuosity of the descending thoracic aorta. Normal heart size. No pleural effusion pneumothorax. IMPRESSION: 1. No evidence of acute cardiopulmonary disease. 2.  Aortic Atherosclerosis (ICD10-I70.0). Electronically Signed   By: Emmit Alexanders M.D.   On: 05/07/2022 10:58   DG Chest 2 View  Result Date: 05/06/2022 CLINICAL DATA:  Reason for exam: weight loss - 20 lbs over the last month. Patient c/o weakness and fatigue x 1 month. Nonsmoker. EXAM: CHEST - 2 VIEW COMPARISON:  03/06/2022 FINDINGS: Lungs are clear. Heart size and mediastinal contours are within normal limits. Aortic Atherosclerosis (ICD10-170.0). No effusion. Visualized bones unremarkable. IMPRESSION: No acute cardiopulmonary disease. Electronically Signed   By: Lucrezia Europe M.D.   On: 05/06/2022 14:28    Procedures Procedures     Medications Ordered in ED Medications  insulin regular, human (MYXREDLIN) 100 units/ 100 mL infusion (has no administration in time range)  lactated ringers infusion (has no administration in time range)  dextrose 5 % in lactated ringers infusion (has no administration in time range)  dextrose 50 % solution 0-50 mL (has no administration in time range)  sodium chloride 0.9 % bolus 500 mL (0 mLs Intravenous Stopped 05/07/22 1200)    ED Course/ Medical Decision Making/ A&P                             Medical Decision Making Amount and/or Complexity of Data Reviewed Labs:  ordered. Radiology: ordered.  Risk Prescription drug management. Decision regarding hospitalization.    GARLAN DREWES is a 87 y.o. male with a past medical history significant for previous prostate cancer, hyperlipidemia, GERD, and aortic atherosclerosis who presents from PCP for further evaluation of hyperglycemia as well as recent chest pain, shortness of breath, congestion, cough, constipation, urinary frequency, urgency, polyuria, and polydipsia.  Patient reports that for the last few weeks he has been having worsened fatigue and feeling dehydrated with dry mouth and increased thirst.  He reports he has been peeing more and is now peeing less.  He denies any abdominal pain but does report he had some left-sided chest pain for some time.  He reports it is not exertional or pleuritic but it is a pressure in his left central chest.  He reportedly had a fall recently but does not think is related to the fall.  He denies nausea and vomiting.  Denies any diarrhea but does report some constipation.  Denies any history of hyperglycemia or diabetes troubles.  Denies history of MI or previous chest pain like this.  Denies history of blood clots.  He does report one of his ankles were swollen in the past but is not bothering him now.  According to patient he went to PCP yesterday where he had some blood work that showed his A1c was over 14 and his glucose was in the 500s.  This is new for him.  He was told to come in today for further evaluation of new hyperglycemia in the setting of all this fatigue and other symptoms.  On exam, lungs were clear and chest was nontender.  I cannot reproduce the discomfort.  Abdomen was nontender.  Normal bowel sounds.  Legs are nontender and nonedematous.  Good pulses in extremities.  No focal neurologic deficits.  His mucous membranes are dry.  Patient otherwise well-appearing.  Family reports he is lost about 20 pounds recently.  EKG showed no STEMI.  Clinically I  suspect  patient has new hyperglycemia and new diabetes causing the dehydration and polyuria/polydipsia.  I suspect this is causing his fatigue and dehydration causing the constipation.  With the URI symptoms with cough and his urinary symptoms we will make sure that is not a bacterial or viral infection causing his precipitating hyperglycemia.  Will get screening labs.  Will check for DKA.  Due to this new hyperglycemia and his constellation of symptoms I do anticipate he will need admission for further workup after workup is completed.   Workup continues to return.  Beta hydroxybutyric acid was elevated at 1.34.  He does not appear to be in DKA at this time however I am concerned about this hyperglycemia in the 400s with no history of diabetes.  X-ray did not show new pneumonia.  Urinalysis showed ketones but he was not acidotic.  Troponin slightly elevated at 25, will trend.  TSH normal.  Due to his likely new diabetes and his constellation of symptoms and dehydration, I called medicine who agrees with admission.  Pharmacy team recommended insulin drip which was ordered.  Patient will be admitted for further management.        Final Clinical Impression(s) / ED Diagnoses Final diagnoses:  Hyperglycemia     Clinical Impression: 1. Hyperglycemia     Disposition: Admit  This note was prepared with assistance of Dragon voice recognition software. Occasional wrong-word or sound-a-like substitutions may have occurred due to the inherent limitations of voice recognition software.     Cing Davidson, Gwenyth Allegra, MD 05/07/22 1259

## 2022-05-07 NOTE — ED Triage Notes (Signed)
Pt referred to ED from PCP's office for abnormal labs. Pt states he was told he was having high glucose and decreased kidney fxn. Pt states that last week he had syncopal episode and fell, injuring his L ribs.

## 2022-05-07 NOTE — H&P (Signed)
History and Physical    Patient: Douglas [name] TWS:[id number] DOB: 06-09-1935 DOA: 05/07/2022 DOS: the patient was seen and examined on 05/07/2022 PCP: [name], MD  Patient coming from: Home  Chief Complaint:  Chief Complaint  Patient presents with   Abnormal Lab   HPI: Douglas [name] is a 87 y.o. male with medical history significant of colon polyp, seasonal allergies, prostate cancer, cataracts, glaucoma, unspecified dizziness, hearing loss, GERD, hyperlipidemia, male hypogonadism, vitamin B12 deficiency, vitamin D deficiency, diet controlled type 2 diabetes who was asked to come to the emergency department due to abnormal glucose level.  He has been experiencing blurry vision, polyuria and polydipsia over the past 2 weeks.  He has also has an unspecified amount of weight loss. He denied fever, chills, rhinorrhea, sore throat, wheezing or hemoptysis.  No chest pain, palpitations, diaphoresis, PND, orthopnea or pitting edema of the lower extremities.  No abdominal pain, nausea, emesis, diarrhea, constipation, melena or hematochezia.  No flank pain, dysuria, frequency or hematuria.    Lab work: Urinalysis showed glucosuria more than 500 and ketonuria 20 mg deciliter with rare bacteria.  CBC showed a white count 5.5, hemoglobin 12.9 g/dL platelets 173.  D-dimer was 0.5.  Lactic acid was normal.  Beta hydroxybutyric acid was 1.34 mmol/L.  Troponin was 25 x 2.  Venous blood gas showed a bicarbonate of 31.7 and acid-base axis of 5.8 mmol/L, the rest of the measurements were unremarkable.  TSH was normal.  Lipase 46.  CMP showed a chloride of 94, the rest of the electrolytes were normal.  Glucose 419 BUN 19 and creatinine 1.26 mg/deciliter.  Total protein is 8.4 g/dL.  The rest of the CMP measurements were normal.  Imaging: Portable 1 view chest radiograph show aortic atherosclerosis but no evidence of acute cardiopulmonary disease.  ED course: Initial vital signs were temperature 97.7 F,  pulse 97, respiration 16, BP 187/94 mmHg O2 sat 95% on room air.  The patient received 500 mL of normal saline bolus and was started on an insulin infusion.    Review of Systems: As mentioned in the history of present illness. All other systems reviewed and are negative.  Past Medical History:  Diagnosis Date   Adenomatous colon polyp 03/2001   Allergy    SEASONAL   Cancer ([place])    Cataract    Dizziness    GERD (gastroesophageal reflux disease)    Hearing loss    Hyperlipidemia    Low testosterone    Past Surgical History:  Procedure Laterality Date   APPENDECTOMY     COLONOSCOPY     EYE SURGERY     POLYPECTOMY     PROSTATE SURGERY     SPINE SURGERY     Social History:  reports that he has never smoked. He has never used smokeless tobacco. He reports that he does not drink alcohol and does not use drugs.  No Known Allergies  Family History  Problem Relation Age of Onset   Cancer Father 52       pancreatic   Other Mother        age 1   Prostate cancer Brother    Colon cancer Neg Hx    Esophageal cancer Neg Hx    Liver cancer Neg Hx    Pancreatic cancer Neg Hx    Rectal cancer Neg Hx    Stomach cancer Neg Hx     Prior to Admission medications   Medication Sig Start Date End  Date Taking? Authorizing Provider  albuterol (VENTOLIN HFA) 108 (90 Base) MCG/ACT inhaler TAKE 2 PUFFS BY MOUTH EVERY 6 HOURS AS NEEDED FOR WHEEZE OR SHORTNESS OF BREATH 06/04/21   Maximiano Coss, NP  cetirizine (ZYRTEC) 10 MG tablet TAKE 1 TABLET BY MOUTH EVERY DAY 09/09/21   [name], MD  Cholecalciferol (VITAMIN D) 2000 UNITS tablet Take 2,000 Units by mouth daily.    [provider]  latanoprost (XALATAN) 0.005 % ophthalmic solution Place 1 drop into the left eye at bedtime. 07/14/19   [provider]  Multiple Vitamin (MULTIVITAMIN) tablet Take 1 tablet by mouth daily.    [provider]  pantoprazole (PROTONIX) 40 MG tablet TAKE 1 TABLET BY MOUTH EVERY  DAY 03/02/22   [name], Ines Bloomer, MD  RESTASIS 0.05 % ophthalmic emulsion INSTILL 1 DROP INTO BOTH EYES TWICE A DAY 07/13/17   [provider]  simvastatin (ZOCOR) 20 MG tablet TAKE 1 TABLET DAILY AT 6PM. 01/03/22   [name], MD  Testosterone 30 MG/ACT SOLN Place 30 g onto the skin. Patient not taking: Reported on 05/06/2022    [provider]    Physical Exam: Vitals:   05/07/22 1010  BP: (!) 187/94  Pulse: 97  Resp: 16  Temp: 97.7 F (36.5 C)  TempSrc: Oral  SpO2: 95%   Physical Exam Vitals and nursing note reviewed.  Constitutional:      Appearance: Normal appearance.  [name]:     Head: Normocephalic.     Nose: No rhinorrhea.     Mouth/Throat:     Mouth: Mucous membranes are dry.  Eyes:     General: No scleral icterus.    Pupils: Pupils are equal, round, and reactive to light.  Cardiovascular:     Rate and Rhythm: Normal rate and regular rhythm.  Pulmonary:     Effort: Pulmonary effort is normal.     Breath sounds: Normal breath sounds.  Abdominal:     General: Bowel sounds are normal. There is no distension.     Palpations: Abdomen is soft.     Tenderness: There is no abdominal tenderness. There is no guarding.  Musculoskeletal:     Cervical back: Neck supple.     Right lower leg: No edema.     Left lower leg: No edema.  Skin:    General: Skin is warm and dry.  Neurological:     General: No focal deficit present.     Mental Status: He is alert and oriented to person, place, and time.  Psychiatric:        Mood and Affect: Mood normal.        Behavior: Behavior normal.    Data Reviewed:  Results are pending, will review when available.  Assessment and Plan: Principal Problem:   Uncontrolled type 2 diabetes mellitus with hyperglycemia ([place]) Observation/stepdown. Keep NPO. Continue IV fluids. Continue insulin infusion. Monitor CBG closely. BMP every 4 hours. BHA every 8 hours. Replace electrolytes as needed. Consult  diabetes coordinator. Transition to SQ insulin per Endo tool.  Active Problems:   History of prostate cancer (HCC)   Bladder outlet obstruction Continue alfuzosin 10 mg p.o. at bedtime. Follow-up with urology as an outpatient.    Dyslipidemia     Atherosclerosis of aorta (HCC) Continue simvastatin 20 mg p.o. daily.    Glaucoma Will reschedule ophthalmology appointment for next week. Continue Alphagan 1 drop on left eye twice daily. Continue Restasis 1 drop on both eyes twice daily. Continue  Cosopt 1 drop on both eyes twice daily. Continue Xalatan drops on left eye at bedtime. Continue netarsudil drops on both eyes nightly.     Advance Care Planning:   Code Status: Full Code   Consults:   Family Communication: His daughter and spouse were at bedside.  Severity of Illness: The appropriate patient status for this patient is OBSERVATION. Observation status is judged to be reasonable and necessary in order to provide the required intensity of service to ensure the patient's safety. The patient's presenting symptoms, physical exam findings, and initial radiographic and laboratory data in the context of their medical condition is felt to place them at decreased risk for further clinical deterioration. Furthermore, it is anticipated that the patient will be medically stable for discharge from the hospital within 2 midnights of admission.   Author: Reubin Milan, MD 05/07/2022 12:56 PM  For on call review [email or web address].   This document was prepared using Dragon voice recognition software and may contain some unintended transcription errors.\

## 2022-05-07 NOTE — Inpatient Diabetes Management (Signed)
Inpatient Diabetes Program Recommendations  AACE/ADA: New Consensus Statement on Inpatient Glycemic Control (2015)  Target Ranges:  Prepandial:   less than 140 mg/dL      Peak postprandial:   less than 180 mg/dL (1-2 hours)      Critically ill patients:  140 - 180 mg/dL   Lab Results  Component Value Date   GLUCAP 344 (H) 05/07/2022   HGBA1C 14.5 Repeated and verified X2. (H) 05/06/2022   Diabetes history: DM2 Outpatient Diabetes medications: Diet controlled Current orders for Inpatient glycemic control: IV insulin to be started  Inpatient Diabetes Program Recommendations:   Patient in ED to be started on IV insulin drip. Hx diet controlled diabetes.  Will follow while inpatient.  Thank you, Nani Gasser. Patrecia Veiga, RN, MSN, CDE  Diabetes Coordinator Inpatient Glycemic Control Team Team Pager [phone] (8am-5pm) 05/07/2022 1:03 PM

## 2022-05-07 NOTE — Progress Notes (Signed)
Thanks

## 2022-05-08 ENCOUNTER — Other Ambulatory Visit (HOSPITAL_COMMUNITY): Payer: Self-pay

## 2022-05-08 ENCOUNTER — Other Ambulatory Visit: Payer: Self-pay

## 2022-05-08 ENCOUNTER — Observation Stay (HOSPITAL_COMMUNITY): Payer: Medicare Other

## 2022-05-08 ENCOUNTER — Encounter (HOSPITAL_COMMUNITY): Payer: Self-pay | Admitting: Internal Medicine

## 2022-05-08 DIAGNOSIS — H409 Unspecified glaucoma: Secondary | ICD-10-CM | POA: Diagnosis present

## 2022-05-08 DIAGNOSIS — Z8719 Personal history of other diseases of the digestive system: Secondary | ICD-10-CM | POA: Diagnosis not present

## 2022-05-08 DIAGNOSIS — H401122 Primary open-angle glaucoma, left eye, moderate stage: Secondary | ICD-10-CM | POA: Diagnosis not present

## 2022-05-08 DIAGNOSIS — E538 Deficiency of other specified B group vitamins: Secondary | ICD-10-CM | POA: Diagnosis present

## 2022-05-08 DIAGNOSIS — E876 Hypokalemia: Secondary | ICD-10-CM | POA: Diagnosis present

## 2022-05-08 DIAGNOSIS — R55 Syncope and collapse: Secondary | ICD-10-CM

## 2022-05-08 DIAGNOSIS — K219 Gastro-esophageal reflux disease without esophagitis: Secondary | ICD-10-CM | POA: Diagnosis present

## 2022-05-08 DIAGNOSIS — E559 Vitamin D deficiency, unspecified: Secondary | ICD-10-CM | POA: Diagnosis present

## 2022-05-08 DIAGNOSIS — H401111 Primary open-angle glaucoma, right eye, mild stage: Secondary | ICD-10-CM | POA: Diagnosis not present

## 2022-05-08 DIAGNOSIS — H04123 Dry eye syndrome of bilateral lacrimal glands: Secondary | ICD-10-CM | POA: Diagnosis not present

## 2022-05-08 DIAGNOSIS — E1165 Type 2 diabetes mellitus with hyperglycemia: Secondary | ICD-10-CM | POA: Diagnosis present

## 2022-05-08 DIAGNOSIS — Z86010 Personal history of colonic polyps: Secondary | ICD-10-CM | POA: Diagnosis not present

## 2022-05-08 DIAGNOSIS — R7989 Other specified abnormal findings of blood chemistry: Secondary | ICD-10-CM | POA: Diagnosis present

## 2022-05-08 DIAGNOSIS — H9190 Unspecified hearing loss, unspecified ear: Secondary | ICD-10-CM | POA: Diagnosis present

## 2022-05-08 DIAGNOSIS — Z1152 Encounter for screening for COVID-19: Secondary | ICD-10-CM | POA: Diagnosis not present

## 2022-05-08 DIAGNOSIS — R739 Hyperglycemia, unspecified: Secondary | ICD-10-CM | POA: Diagnosis present

## 2022-05-08 DIAGNOSIS — Z79899 Other long term (current) drug therapy: Secondary | ICD-10-CM | POA: Diagnosis not present

## 2022-05-08 DIAGNOSIS — K5900 Constipation, unspecified: Secondary | ICD-10-CM | POA: Diagnosis present

## 2022-05-08 DIAGNOSIS — Z800 Family history of malignant neoplasm of digestive organs: Secondary | ICD-10-CM | POA: Diagnosis not present

## 2022-05-08 DIAGNOSIS — I700 Atherosclerosis of aorta: Secondary | ICD-10-CM | POA: Diagnosis present

## 2022-05-08 DIAGNOSIS — Z66 Do not resuscitate: Secondary | ICD-10-CM | POA: Diagnosis present

## 2022-05-08 DIAGNOSIS — E860 Dehydration: Secondary | ICD-10-CM | POA: Diagnosis present

## 2022-05-08 DIAGNOSIS — E785 Hyperlipidemia, unspecified: Secondary | ICD-10-CM | POA: Diagnosis present

## 2022-05-08 DIAGNOSIS — Z8546 Personal history of malignant neoplasm of prostate: Secondary | ICD-10-CM | POA: Diagnosis not present

## 2022-05-08 DIAGNOSIS — R824 Acetonuria: Secondary | ICD-10-CM | POA: Diagnosis present

## 2022-05-08 DIAGNOSIS — Z8042 Family history of malignant neoplasm of prostate: Secondary | ICD-10-CM | POA: Diagnosis not present

## 2022-05-08 DIAGNOSIS — N320 Bladder-neck obstruction: Secondary | ICD-10-CM | POA: Diagnosis present

## 2022-05-08 DIAGNOSIS — R81 Glycosuria: Secondary | ICD-10-CM | POA: Diagnosis present

## 2022-05-08 LAB — CBC WITH DIFFERENTIAL/PLATELET
Abs Immature Granulocytes: 0.25 10*3/uL — ABNORMAL HIGH (ref 0.00–0.07)
Basophils Absolute: 0 10*3/uL (ref 0.0–0.1)
Basophils Relative: 0 %
Eosinophils Absolute: 0.1 10*3/uL (ref 0.0–0.5)
Eosinophils Relative: 1 %
HCT: 35 % — ABNORMAL LOW (ref 39.0–52.0)
Hemoglobin: 11.9 g/dL — ABNORMAL LOW (ref 13.0–17.0)
Immature Granulocytes: 5 %
Lymphocytes Relative: 51 %
Lymphs Abs: 2.9 10*3/uL (ref 0.7–4.0)
MCH: 31.9 pg (ref 26.0–34.0)
MCHC: 34 g/dL (ref 30.0–36.0)
MCV: 93.8 fL (ref 80.0–100.0)
Monocytes Absolute: 0.4 10*3/uL (ref 0.1–1.0)
Monocytes Relative: 8 %
Neutro Abs: 2 10*3/uL (ref 1.7–7.7)
Neutrophils Relative %: 35 %
Platelets: 166 10*3/uL (ref 150–400)
RBC: 3.73 MIL/uL — ABNORMAL LOW (ref 4.22–5.81)
RDW: 11.9 % (ref 11.5–15.5)
WBC: 5.6 10*3/uL (ref 4.0–10.5)
nRBC: 0 % (ref 0.0–0.2)

## 2022-05-08 LAB — ECHOCARDIOGRAM COMPLETE
Area-P 1/2: 2.28 cm2
Calc EF: 63.5 %
S' Lateral: 3 cm
Single Plane A2C EF: 64.7 %
Single Plane A4C EF: 63.8 %

## 2022-05-08 LAB — BASIC METABOLIC PANEL
Anion gap: 10 (ref 5–15)
BUN: 12 mg/dL (ref 8–23)
CO2: 25 mmol/L (ref 22–32)
Calcium: 9.1 mg/dL (ref 8.9–10.3)
Chloride: 101 mmol/L (ref 98–111)
Creatinine, Ser: 0.89 mg/dL (ref 0.61–1.24)
GFR, Estimated: >60 mL/min (ref >60)
Glucose, Bld: 263 mg/dL — ABNORMAL HIGH (ref 70–99)
Potassium: 4.3 mmol/L (ref 3.5–5.1)
Sodium: 136 mmol/L (ref 135–145)

## 2022-05-08 LAB — GLUCOSE, CAPILLARY
Glucose-Capillary: 331 mg/dL — ABNORMAL HIGH (ref 70–99)
Glucose-Capillary: 349 mg/dL — ABNORMAL HIGH (ref 70–99)
Glucose-Capillary: 278 mg/dL — ABNORMAL HIGH (ref 70–99)
Glucose-Capillary: 245 mg/dL — ABNORMAL HIGH (ref 70–99)

## 2022-05-08 LAB — COMPREHENSIVE METABOLIC PANEL
ALT: 26 U/L (ref 0–44)
AST: 23 U/L (ref 15–41)
Albumin: 3.4 g/dL — ABNORMAL LOW (ref 3.5–5.0)
Alkaline Phosphatase: 42 U/L (ref 38–126)
Anion gap: 13 (ref 5–15)
BUN: 13 mg/dL (ref 8–23)
CO2: 25 mmol/L (ref 22–32)
Calcium: 9.2 mg/dL (ref 8.9–10.3)
Chloride: 95 mmol/L — ABNORMAL LOW (ref 98–111)
Creatinine, Ser: 0.82 mg/dL (ref 0.61–1.24)
GFR, Estimated: >60 mL/min (ref >60)
Glucose, Bld: 308 mg/dL — ABNORMAL HIGH (ref 70–99)
Potassium: 3.4 mmol/L — ABNORMAL LOW (ref 3.5–5.1)
Sodium: 133 mmol/L — ABNORMAL LOW (ref 135–145)
Total Bilirubin: 1 mg/dL (ref 0.3–1.2)
Total Protein: 6.2 g/dL — ABNORMAL LOW (ref 6.5–8.1)

## 2022-05-08 LAB — URINE CULTURE: Culture: NO GROWTH

## 2022-05-08 MED ORDER — LIVING WELL WITH DIABETES BOOK
Freq: Once | Status: AC
  Filled 2022-05-08: qty 1

## 2022-05-08 MED ORDER — INSULIN GLARGINE-YFGN 100 UNIT/ML ~~LOC~~ SOLN
15.0000 [IU] | Freq: Every day | SUBCUTANEOUS | Status: DC
  Administered 2022-05-08: 15 [IU] via SUBCUTANEOUS
  Filled 2022-05-08: qty 0.15

## 2022-05-08 MED ORDER — POTASSIUM CHLORIDE CRYS ER 20 MEQ PO TBCR
40.0000 meq | EXTENDED_RELEASE_TABLET | Freq: Once | ORAL | Status: AC
  Administered 2022-05-08: 40 meq via ORAL
  Filled 2022-05-08: qty 2

## 2022-05-08 MED ORDER — INSULIN ASPART PROT & ASPART (70-30 MIX) 100 UNIT/ML ~~LOC~~ SUSP
12.0000 [IU] | Freq: Once | SUBCUTANEOUS | Status: AC
  Administered 2022-05-09: 12 [IU] via SUBCUTANEOUS
  Filled 2022-05-08: qty 10

## 2022-05-08 MED ORDER — INSULIN STARTER KIT- PEN NEEDLES (ENGLISH)
1.0000 | Freq: Once | Status: AC
  Administered 2022-05-08: 1
  Filled 2022-05-08: qty 1

## 2022-05-08 NOTE — TOC Initial Note (Signed)
Transition of Care [facility]) - Initial/Assessment Note    Patient Details  Name: Douglas [name] MRN: [id number] Date of Birth: 1935/09/17  [facility] CM/SW Contact:    Angelita Ingles, RN Phone [phone]  05/08/2022, 7:06 PM  Clinical Narrative:                 [facility] consulted for Home health needs. CM at bedside to discuss disposition with patient. Patient states that he is from home where he normally functions independently. Patient and wife are agreeable to home health and states that they do not have a preference as long as insurance will cover. Home health referral has been accepted by Claiborne Billings with [facility]. Orders have been entered.  AVS has been updated.       Patient Goals and CMS Choice            Expected Discharge Plan and Services                                              Prior Living Arrangements/Services                       Activities of Daily Living Home Assistive Devices/Equipment: None ADL Screening (condition at time of admission) Patient's cognitive ability adequate to safely complete daily activities?: Yes Is the patient deaf or have difficulty hearing?: Yes Does the patient have difficulty seeing, even when wearing glasses/contacts?: No Does the patient have difficulty concentrating, remembering, or making decisions?: No Patient able to express need for assistance with ADLs?: No Does the patient have difficulty dressing or bathing?: No Independently performs ADLs?: Yes (appropriate for developmental age) Does the patient have difficulty walking or climbing stairs?: No Weakness of Legs: None Weakness of Arms/Hands: None  Permission Sought/Granted                  Emotional Assessment              Admission diagnosis:  Hyperglycemia [R73.9] Uncontrolled type 2 diabetes mellitus with hyperglycemia ([place]) [E11.65] Patient Active Problem List   Diagnosis Date Noted   Hyperglycemia 05/08/2022    Uncontrolled type 2 diabetes mellitus with hyperglycemia ([place]) 05/07/2022   Glaucoma 05/07/2022   Tiredness 05/06/2022   Weight loss 05/06/2022   Viral upper respiratory tract infection 03/06/2022   Neuropathic pain of both feet 11/05/2021   Decreased hearing of left ear 11/05/2021   Simple chronic bronchitis ([place]) 06/18/2021   Diet-controlled diabetes mellitus ([place]) 09/20/2020   History of prostate cancer 09/20/2020   Atherosclerosis of aorta ([place]) 09/20/2020   B12 deficiency 12/16/2016   Bladder outlet obstruction 04/25/2015   Erectile dysfunction due to arterial insufficiency 04/25/2015   Osteoporosis 04/25/2015   Vitamin D deficiency 04/25/2015   Prostate cancer ([place]) 05/30/2011   Dyslipidemia 05/30/2011   Hypogonadism male 05/30/2011   PCP:  [name], MD Pharmacy:   [id number] [id number]: [phone]: [phone]    Social Determinants of Health (SDOH) Social History: SDOH Screenings   Food Insecurity: No Food Insecurity (05/07/2022)  Housing: Low Risk  (05/07/2022)  Transportation Needs: No Transportation Needs (05/07/2022)  Utilities: Not At Risk (05/07/2022)  Alcohol Screen: Low Risk  (07/12/2021)  Depression (PHQ2-9): Low  Risk  (05/06/2022)  Financial Resource Strain: Low Risk  (07/12/2021)  Physical Activity: Sufficiently Active (07/12/2021)  Social Connections: Socially Integrated (07/12/2021)  Stress: No Stress Concern Present (07/12/2021)  Tobacco Use: Low Risk  (05/08/2022)   SDOH Interventions:     Readmission Risk Interventions     No data to display

## 2022-05-08 NOTE — Evaluation (Signed)
Physical Therapy Evaluation Patient Details Name: Douglas [name] MRN: [id number] DOB: 1935-09-02 Today's Date: 05/08/2022  History of Present Illness  87 yo male who presented to the ED with abnormal glucose levels >500. Admitted with uncontrolled DM type 2 with hyperglycemic state. PMH CA, dizziness, hearing loss, HLD, spine surgery, glaucoma  Clinical Impression  Pt received in bed, family present during eval. Able to mobilize well but was generally weak and unsteady, walked in the hallway with light MinA for balance and was fairly easily fatigued- but would likely do much better with BUE support on RW. Declined up to chair and was left in bed in chair position with all needs met, bed alarm active. Would benefit from skilled HHPT f/u at DC.        Recommendations for follow up therapy are one component of a multi-disciplinary discharge planning process, led by the attending physician.  Recommendations may be updated based on patient status, additional functional criteria and insurance authorization.  Follow Up Recommendations Home health PT      Assistance Recommended at Discharge Intermittent Supervision/Assistance  Patient can return home with the following  A little help with walking and/or transfers;Assistance with cooking/housework;Assist for transportation;A little help with bathing/dressing/bathroom;Help with stairs or ramp for entrance    Equipment Recommendations Rolling walker (2 wheels)  Recommendations for Other Services       Functional Status Assessment Patient has had a recent decline in their functional status and demonstrates the ability to make significant improvements in function in a reasonable and predictable amount of time.     Precautions / Restrictions Precautions Precautions: Fall Restrictions Weight Bearing Restrictions: No      Mobility  Bed Mobility Overal bed mobility: Modified Independent             General bed mobility comments: HOB  considerably elevated    Transfers Overall transfer level: Needs assistance Equipment used: None Transfers: Sit to/from Stand Sit to Stand: Min guard           General transfer comment: min guard for safety, grossly unsteady but did not need extra assist for balance with transfers    Ambulation/Gait Ambulation/Gait assistance: Min assist Gait Distance (Feet): 80 Feet Assistive device: None Gait Pattern/deviations: Step-through pattern, Decreased step length - right, Decreased step length - left, Trendelenburg, Drifts right/left, Narrow base of support Gait velocity: decreased     General Gait Details: mild L/R drift with gait with no BUE support, light MinA generally provided but feel he would improve to S-min guard level with RW; easily fatigued  Stairs            Wheelchair Mobility    Modified Rankin (Stroke Patients Only)       Balance Overall balance assessment: Needs assistance Sitting-balance support: Feet supported, No upper extremity supported Sitting balance-Leahy Scale: Good     Standing balance support: No upper extremity supported, During functional activity Standing balance-Leahy Scale: Poor                               Pertinent Vitals/Pain Pain Assessment Pain Assessment: No/denies pain    Home Living Family/patient expects to be discharged to:: Private residence Living Arrangements: Spouse/significant other Available Help at Discharge: Family;Available 24 hours/day Type of Home: House Home Access: Level entry     Alternate Level Stairs-Number of Steps: 12-13 to upper level with L ascending rail, can stay downstairs if needed Home Layout: Two level  Home Equipment: Conservation officer, nature (2 wheels);Cane - single point Additional Comments: 2 falls in the past 6 months, "blanked out in the commode", didn't get dizzy just felt like I blacked out no warning    Prior Function Prior Level of Function : Independent/Modified  Independent                     Hand Dominance        Extremity/Trunk Assessment   Upper Extremity Assessment Upper Extremity Assessment: Generalized weakness    Lower Extremity Assessment Lower Extremity Assessment: Generalized weakness    Cervical / Trunk Assessment Cervical / Trunk Assessment: Normal  Communication   Communication: No difficulties  Cognition Arousal/Alertness: Awake/alert Behavior During Therapy: WFL for tasks assessed/performed Overall Cognitive Status: Within Functional Limits for tasks assessed                                          General Comments      Exercises     Assessment/Plan    PT Assessment Patient needs continued PT services  PT Problem List Decreased strength;Decreased knowledge of use of DME;Decreased activity tolerance;Decreased safety awareness;Decreased balance;Decreased mobility;Decreased coordination       PT Treatment Interventions DME instruction;Balance training;Gait training;Neuromuscular re-education;Stair training;Functional mobility training;Patient/family education;Therapeutic activities;Therapeutic exercise    PT Goals (Current goals can be found in the Care Plan section)  Acute Rehab PT Goals Patient Stated Goal: go home when ready PT Goal Formulation: With patient/family Time For Goal Achievement: 05/22/22 Potential to Achieve Goals: Good    Frequency Min 3X/week     Co-evaluation               AM-PAC PT "6 Clicks" Mobility  Outcome Measure Help needed turning from your back to your side while in a flat bed without using bedrails?: None Help needed moving from lying on your back to sitting on the side of a flat bed without using bedrails?: None Help needed moving to and from a bed to a chair (including a wheelchair)?: A Little Help needed standing up from a chair using your arms (e.g., wheelchair or bedside chair)?: A Little Help needed to walk in hospital room?: A  Little Help needed climbing 3-5 steps with a railing? : A Lot 6 Click Score: 19    End of Session Equipment Utilized During Treatment: Gait belt Activity Tolerance: Patient tolerated treatment well Patient left: with call bell/phone within reach;with bed alarm set;with family/visitor present;in bed (declined up to chair) Nurse Communication: Mobility status PT Visit Diagnosis: Unsteadiness on feet (R26.81);Difficulty in walking, not elsewhere classified (R26.2);Muscle weakness (generalized) (M62.81);Repeated falls (R29.6)    Time: 5597-4163 PT Time Calculation (min) (ACUTE ONLY): 22 min   Charges:   PT Evaluation $PT Eval Low Complexity: 1 Low         Deniece Ree PT DPT PN2

## 2022-05-08 NOTE — Progress Notes (Signed)
PROGRESS NOTE  Douglas [name]  GUR:[id number] DOB: 12-Oct-1935 DOA: 05/07/2022 PCP: [name], MD   Brief Narrative: Patient is 87 year old male with history of colon polyp, prostate cancer, GERD, hyperlipidemia, type 2 diabetes not on meds who presented to the emergency department for the evaluation of abnormal glucose level.  Patient endorses symptoms of blurry vision, polyuria, polydipsia over 2 weeks.  On presentation UA showed glycosuria, ketonuria.  Blood work showed glucose of 119, normal anion gap, creatinine 1.2.  Patient was admitted for the management of uncontrolled diabetes type 2 with hyperglycemia.  Diabetic coordinator consulted.  Plan for providing insulin education today and monitoring blood sugars  Assessment & Plan:  Principal Problem:   Uncontrolled type 2 diabetes mellitus with hyperglycemia (HCC) Active Problems:   Prostate cancer (HCC)   Dyslipidemia   Bladder outlet obstruction   Atherosclerosis of aorta (HCC)   Glaucoma  Uncontrolled diabetes type 2 with hyperglycemia: Presented with complaints of blurry vision, polyuria, polydipsia for 2 weeks.  Also report of syncopal episode x 2 at home a week ago.  Apparently not taking any medications at home.  Hemoglobin A1c 14.5.  Diabetic coordinator consulted.  Started on insulin drip initially.  Now has been changed to sliding scale and long-acting insulin.  He needs insulin on discharge. Will wait for diabetic coordinator recommendation on choice of insulin with dose  History of prostate cancer/bladder outlet obstruction: On alfuzosin.  We recommend follow-up with urology as an outpatient  Hyperlipidemia: On simvastatin  History of glaucoma: Continue home eyedrops.  Follow-up with ophthalmology tomorrow at [facility]  Hypokalemia: supplemented with  potassium  Syncope: Likely from dehydration from hyperglycemia causing diuresis.  PT consulted       DVT prophylaxis:enoxaparin (LOVENOX) injection 40 mg  Start: 05/07/22 2200     Code Status: Full Code  Family Communication: Wife and daughter at bedside  Patient status: obs  Patient is from : Home  Anticipated discharge to: Home  Estimated DC date: Tomorrow   Consultants: None  Procedures: None  Antimicrobials:  Anti-infectives (From admission, onward)    None       Subjective:  Seen and examined at bedside today.  Very comfortable, hemodynamically stable.  Eating his breakfast.  Daughter and wife at bedside too.  Denies any complaints  Objective: Vitals:   05/07/22 1954 05/08/22 0006 05/08/22 0415 05/08/22 0757  BP: (!) 161/79 135/72 137/73 (!) 149/68  Pulse: 71 65 63 64  Resp: '18 18 18 18  '$ Temp: 98 F (36.7 C) 98.1 F (36.7 C) 97.8 F (36.6 C) 97.7 F (36.5 C)  TempSrc: Oral Oral Oral Oral  SpO2: 100% 99% 97% 97%    Intake/Output Summary (Last 24 hours) at 05/08/2022 0818 Last data filed at 05/08/2022 0800 Gross per 24 hour  Intake 1751.65 ml  Output --  Net 1751.65 ml   There were no vitals filed for this visit.  Examination:  General exam: Overall comfortable, not in distress HEENT: PERRL Respiratory system:  no wheezes or crackles  Cardiovascular system: S1 & S2 heard, RRR.  Gastrointestinal system: Abdomen is nondistended, soft and nontender. Central nervous system: Alert and oriented Extremities: No edema, no clubbing ,no cyanosis Skin: No rashes, no ulcers,no icterus     Data Reviewed: I have personally reviewed following labs and imaging studies  CBC: Recent Labs  Lab 05/06/22 1412 05/07/22 1047 05/08/22 0523  WBC 6.5 5.5 5.6  NEUTROABS 3.0 2.0 2.0  HGB 13.0 12.8* 11.9*  HCT 38.5* 37.5*  35.0*  MCV 94.9 92.8 93.8  PLT 188.0 173 361   Basic Metabolic Panel: Recent Labs  Lab 05/06/22 1412 05/07/22 1047 05/08/22 0523  NA 133* 135 133*  K 5.2* 3.7 3.4*  CL 92* 94* 95*  CO2 '29 26 25  '$ GLUCOSE 502* 419* 308*  BUN '21 19 13  '$ CREATININE 1.29 1.26* 0.82  CALCIUM 10.3 10.0 9.2      Recent Results (from the past 240 hour(s))  Resp panel by RT-PCR (RSV, Flu A&B, Covid) Anterior Nasal Swab     Status: None   Collection Time: 05/07/22 10:32 AM   Specimen: Anterior Nasal Swab  Result Value Ref Range Status   SARS Coronavirus 2 by RT PCR NEGATIVE NEGATIVE Final    Comment: (NOTE) SARS-CoV-2 target nucleic acids are NOT DETECTED.  The SARS-CoV-2 RNA is generally detectable in upper respiratory specimens during the acute phase of infection. The lowest concentration of SARS-CoV-2 viral copies this assay can detect is 138 copies/mL. A negative result does not preclude SARS-Cov-2 infection and should not be used as the sole basis for treatment or other patient management decisions. A negative result may occur with  improper specimen collection/handling, submission of specimen other than nasopharyngeal swab, presence of viral mutation(s) within the areas targeted by this assay, and inadequate number of viral copies(<138 copies/mL). A negative result must be combined with clinical observations, patient history, and epidemiological information. The expected result is Negative.  Fact Sheet for Patients:  EntrepreneurPulse.com.au  Fact Sheet for Healthcare Providers:  IncredibleEmployment.be  This test is no t yet approved or cleared by the [place] FDA and  has been authorized for detection and/or diagnosis of SARS-CoV-2 by FDA under an Emergency Use Authorization (EUA). This EUA will remain  in effect (meaning this test can be used) for the duration of the COVID-19 declaration under Section 564(b)(1) of the Act, 21 U.S.C.section 360bbb-3(b)(1), unless the authorization is terminated  or revoked sooner.       Influenza A by PCR NEGATIVE NEGATIVE Final   Influenza B by PCR NEGATIVE NEGATIVE Final    Comment: (NOTE) The Xpert Xpress SARS-CoV-2/FLU/RSV plus assay is intended as an aid in the diagnosis of influenza from  Nasopharyngeal swab specimens and should not be used as a sole basis for treatment. Nasal washings and aspirates are unacceptable for Xpert Xpress SARS-CoV-2/FLU/RSV testing.  Fact Sheet for Patients: EntrepreneurPulse.com.au  Fact Sheet for Healthcare Providers: IncredibleEmployment.be  This test is not yet approved or cleared by the [place] FDA and has been authorized for detection and/or diagnosis of SARS-CoV-2 by FDA under an Emergency Use Authorization (EUA). This EUA will remain in effect (meaning this test can be used) for the duration of the COVID-19 declaration under Section 564(b)(1) of the Act, 21 U.S.C. section 360bbb-3(b)(1), unless the authorization is terminated or revoked.     Resp Syncytial Virus by PCR NEGATIVE NEGATIVE Final    Comment: (NOTE) Fact Sheet for Patients: EntrepreneurPulse.com.au  Fact Sheet for Healthcare Providers: IncredibleEmployment.be  This test is not yet approved or cleared by the [place] FDA and has been authorized for detection and/or diagnosis of SARS-CoV-2 by FDA under an Emergency Use Authorization (EUA). This EUA will remain in effect (meaning this test can be used) for the duration of the COVID-19 declaration under Section 564(b)(1) of the Act, 21 U.S.C. section 360bbb-3(b)(1), unless the authorization is terminated or revoked.  Performed at [facility], [place] [address] [place], [place] [address]  Radiology Studies: VAS US CAROTID  Result Date: 05/07/2022 Carotid Arterial Duplex Study Patient Name:  TEREK BEE  Date of Exam:   05/07/2022 Medical Rec #: [id number]       Accession #:    [id number] Date of Birth: Sep 16, 1935       Patient Gender: M Patient Age:   85 years Exam Location:  [facility] Procedure:      VAS US CAROTID Referring Phys: DAVID ORTIZ  --------------------------------------------------------------------------------  Indications:       Syncope. Risk Factors:      None. Comparison Study:  No prior studies. Performing Technologist: Oliver Hum RVT  Examination Guidelines: A complete evaluation includes B-mode imaging, spectral Doppler, color Doppler, and power Doppler as needed of all accessible portions of each vessel. Bilateral testing is considered an integral part of a complete examination. Limited examinations for reoccurring indications may be performed as noted.  Right Carotid Findings: +----------+--------+--------+--------+-----------------------+--------+           PSV cm/sEDV cm/sStenosisPlaque Description     Comments +----------+--------+--------+--------+-----------------------+--------+ CCA Prox  57      6               smooth and heterogenoustortuous +----------+--------+--------+--------+-----------------------+--------+ CCA Distal92      14              smooth and heterogenous         +----------+--------+--------+--------+-----------------------+--------+ ICA Prox  64      13              smooth and heterogenous         +----------+--------+--------+--------+-----------------------+--------+ ICA Distal55      12                                              +----------+--------+--------+--------+-----------------------+--------+ ECA       50      4                                               +----------+--------+--------+--------+-----------------------+--------+ +----------+--------+-------+--------+-------------------+           PSV cm/sEDV cmsDescribeArm Pressure (mmHG) +----------+--------+-------+--------+-------------------+ Subclavian127                                        +----------+--------+-------+--------+-------------------+ +---------+--------+--+--------+-+---------+ VertebralPSV cm/s43EDV cm/s6Antegrade +---------+--------+--+--------+-+---------+   Left Carotid Findings: +----------+--------+--------+--------+-----------------------+--------+           PSV cm/sEDV cm/sStenosisPlaque Description     Comments +----------+--------+--------+--------+-----------------------+--------+ CCA Prox  92      12              smooth and heterogenoustortuous +----------+--------+--------+--------+-----------------------+--------+ CCA Distal64      10              smooth and heterogenous         +----------+--------+--------+--------+-----------------------+--------+ ICA Prox  53      11              smooth and heterogenous         +----------+--------+--------+--------+-----------------------+--------+ ICA Distal57      12                                              +----------+--------+--------+--------+-----------------------+--------+  ECA       51      4                                               +----------+--------+--------+--------+-----------------------+--------+ +----------+--------+--------+--------+-------------------+           PSV cm/sEDV cm/sDescribeArm Pressure (mmHG) +----------+--------+--------+--------+-------------------+ [id number]                                         +----------+--------+--------+--------+-------------------+ +---------+--------+--+--------+-+---------+ VertebralPSV cm/s37EDV cm/s8Antegrade +---------+--------+--+--------+-+---------+   Summary: Right Carotid: Velocities in the right ICA are consistent with a 1-39% stenosis. Left Carotid: Velocities in the left ICA are consistent with a 1-39% stenosis. Vertebrals: Bilateral vertebral arteries demonstrate antegrade flow. *See table(s) above for measurements and observations.     Preliminary    DG Chest Portable 1 View  Result Date: 05/07/2022 CLINICAL DATA:  Chest pain. EXAM: PORTABLE CHEST 1 VIEW COMPARISON:  05/06/2022. FINDINGS: Clear lungs. Atherosclerotic calcifications of the aortic arch and tortuosity of the  descending thoracic aorta. Normal heart size. No pleural effusion pneumothorax. IMPRESSION: 1. No evidence of acute cardiopulmonary disease. 2.  Aortic Atherosclerosis (ICD10-I70.0). Electronically Signed   By: Emmit Alexanders M.D.   On: 05/07/2022 10:58   DG Chest 2 View  Result Date: 05/06/2022 CLINICAL DATA:  Reason for exam: weight loss - 20 lbs over the last month. Patient c/o weakness and fatigue x 1 month. Nonsmoker. EXAM: CHEST - 2 VIEW COMPARISON:  03/06/2022 FINDINGS: Lungs are clear. Heart size and mediastinal contours are within normal limits. Aortic Atherosclerosis (ICD10-170.0). No effusion. Visualized bones unremarkable. IMPRESSION: No acute cardiopulmonary disease. Electronically Signed   By: Lucrezia Europe M.D.   On: 05/06/2022 14:28    Scheduled Meds:  alfuzosin  10 mg Oral QHS   aspirin EC  81 mg Oral QHS   brimonidine  1 drop Left Eye BID   cycloSPORINE  1 drop Both Eyes BID   dorzolamide-timolol  1 drop Both Eyes BID   enoxaparin (LOVENOX) injection  40 mg Subcutaneous Q24H   insulin aspart  0-15 Units Subcutaneous TID WC   insulin glargine-yfgn  15 Units Subcutaneous Daily   latanoprost  1 drop Left Eye QHS   Netarsudil Dimesylate  1 drop Both Eyes QHS   pantoprazole  40 mg Oral Daily   simvastatin  20 mg Oral q1800   Continuous Infusions:  lactated ringers Stopped (05/07/22 1529)     LOS: 0 days   [name], MD [facility] P2/10/2022, 8:18 AM  PROGRESS NOTE  BYRL LATIN  KGM:[id number] DOB: 1936/01/10 DOA: 05/07/2022 PCP: [name], MD   Brief Narrative:   Assessment & Plan:  Principal Problem:   Uncontrolled type 2 diabetes mellitus with hyperglycemia ([place]) Active Problems:   Prostate cancer ([place])   Dyslipidemia   Bladder outlet obstruction   Atherosclerosis of aorta (HCC)   Glaucoma           DVT prophylaxis:enoxaparin (LOVENOX) injection 40 mg Start: 05/07/22 2200     Code Status: Full Code  Family Communication:    Patient status:  Patient is from :  Anticipated discharge to:  Estimated DC date:   Consultants:   Procedures:  Antimicrobials:  Anti-infectives (From admission, onward)    None  Subjective:   Objective: Vitals:   05/07/22 1954 05/08/22 0006 05/08/22 0415 05/08/22 0757  BP: (!) 161/79 135/72 137/73 (!) 149/68  Pulse: 71 65 63 64  Resp: '18 18 18 18  '$ Temp: 98 F (36.7 C) 98.1 F (36.7 C) 97.8 F (36.6 C) 97.7 F (36.5 C)  TempSrc: Oral Oral Oral Oral  SpO2: 100% 99% 97% 97%    Intake/Output Summary (Last 24 hours) at 05/08/2022 0818 Last data filed at 05/08/2022 0800 Gross per 24 hour  Intake 1751.65 ml  Output --  Net 1751.65 ml   There were no vitals filed for this visit.  Examination:  General exam: Overall comfortable, not in distress HEENT: PERRL Respiratory system:  no wheezes or crackles  Cardiovascular system: S1 & S2 heard, RRR.  Gastrointestinal system: Abdomen is nondistended, soft and nontender. Central nervous system: Alert and oriented Extremities: No edema, no clubbing ,no cyanosis Skin: No rashes, no ulcers,no icterus     Data Reviewed: I have personally reviewed following labs and imaging studies  CBC: Recent Labs  Lab 05/06/22 1412 05/07/22 1047 05/08/22 0523  WBC 6.5 5.5 5.6  NEUTROABS 3.0 2.0 2.0  HGB 13.0 12.8* 11.9*  HCT 38.5* 37.5* 35.0*  MCV 94.9 92.8 93.8  PLT 188.0 173 829   Basic Metabolic Panel: Recent Labs  Lab 05/06/22 1412 05/07/22 1047 05/08/22 0523  NA 133* 135 133*  K 5.2* 3.7 3.4*  CL 92* 94* 95*  CO2 '29 26 25  '$ GLUCOSE 502* 419* 308*  BUN '21 19 13  '$ CREATININE 1.29 1.26* 0.82  CALCIUM 10.3 10.0 9.2     Recent Results (from the past 240 hour(s))  Resp panel by RT-PCR (RSV, Flu A&B, Covid) Anterior Nasal Swab     Status: None   Collection Time: 05/07/22 10:32 AM   Specimen: Anterior Nasal Swab  Result Value Ref Range Status   SARS Coronavirus 2 by RT PCR NEGATIVE NEGATIVE Final     Comment: (NOTE) SARS-CoV-2 target nucleic acids are NOT DETECTED.  The SARS-CoV-2 RNA is generally detectable in upper respiratory specimens during the acute phase of infection. The lowest concentration of SARS-CoV-2 viral copies this assay can detect is 138 copies/mL. A negative result does not preclude SARS-Cov-2 infection and should not be used as the sole basis for treatment or other patient management decisions. A negative result may occur with  improper specimen collection/handling, submission of specimen other than nasopharyngeal swab, presence of viral mutation(s) within the areas targeted by this assay, and inadequate number of viral copies(<138 copies/mL). A negative result must be combined with clinical observations, patient history, and epidemiological information. The expected result is Negative.  Fact Sheet for Patients:  EntrepreneurPulse.com.au  Fact Sheet for Healthcare Providers:  IncredibleEmployment.be  This test is no t yet approved or cleared by the [place] FDA and  has been authorized for detection and/or diagnosis of SARS-CoV-2 by FDA under an Emergency Use Authorization (EUA). This EUA will remain  in effect (meaning this test can be used) for the duration of the COVID-19 declaration under Section 564(b)(1) of the Act, 21 U.S.C.section 360bbb-3(b)(1), unless the authorization is terminated  or revoked sooner.       Influenza A by PCR NEGATIVE NEGATIVE Final   Influenza B by PCR NEGATIVE NEGATIVE Final    Comment: (NOTE) The Xpert Xpress SARS-CoV-2/FLU/RSV plus assay is intended as an aid in the diagnosis of influenza from Nasopharyngeal swab specimens and should not be used as a sole basis for treatment.  Nasal washings and aspirates are unacceptable for Xpert Xpress SARS-CoV-2/FLU/RSV testing.  Fact Sheet for Patients: EntrepreneurPulse.com.au  Fact Sheet for Healthcare  Providers: IncredibleEmployment.be  This test is not yet approved or cleared by the [place] FDA and has been authorized for detection and/or diagnosis of SARS-CoV-2 by FDA under an Emergency Use Authorization (EUA). This EUA will remain in effect (meaning this test can be used) for the duration of the COVID-19 declaration under Section 564(b)(1) of the Act, 21 U.S.C. section 360bbb-3(b)(1), unless the authorization is terminated or revoked.     Resp Syncytial Virus by PCR NEGATIVE NEGATIVE Final    Comment: (NOTE) Fact Sheet for Patients: EntrepreneurPulse.com.au  Fact Sheet for Healthcare Providers: IncredibleEmployment.be  This test is not yet approved or cleared by the [place] FDA and has been authorized for detection and/or diagnosis of SARS-CoV-2 by FDA under an Emergency Use Authorization (EUA). This EUA will remain in effect (meaning this test can be used) for the duration of the COVID-19 declaration under Section 564(b)(1) of the Act, 21 U.S.C. section 360bbb-3(b)(1), unless the authorization is terminated or revoked.  Performed at [facility], [place] [address], [place], [place] [address]      Radiology Studies: VAS US CAROTID  Result Date: 05/07/2022 Carotid Arterial Duplex Study Patient Name:  ZAKARIAH URWIN  Date of Exam:   05/07/2022 Medical Rec #: [id number]       Accession #:    [id number] Date of Birth: 04-11-1935       Patient Gender: M Patient Age:   63 years Exam Location:  [facility] Procedure:      VAS US CAROTID Referring Phys: DAVID ORTIZ --------------------------------------------------------------------------------  Indications:       Syncope. Risk Factors:      None. Comparison Study:  No prior studies. Performing Technologist: Oliver Hum RVT  Examination Guidelines: A complete evaluation includes B-mode imaging, spectral Doppler, color Doppler, and power Doppler  as needed of all accessible portions of each vessel. Bilateral testing is considered an integral part of a complete examination. Limited examinations for reoccurring indications may be performed as noted.  Right Carotid Findings: +----------+--------+--------+--------+-----------------------+--------+           PSV cm/sEDV cm/sStenosisPlaque Description     Comments +----------+--------+--------+--------+-----------------------+--------+ CCA Prox  57      6               smooth and heterogenoustortuous +----------+--------+--------+--------+-----------------------+--------+ CCA Distal92      14              smooth and heterogenous         +----------+--------+--------+--------+-----------------------+--------+ ICA Prox  64      13              smooth and heterogenous         +----------+--------+--------+--------+-----------------------+--------+ ICA Distal55      12                                              +----------+--------+--------+--------+-----------------------+--------+ ECA       50      4                                               +----------+--------+--------+--------+-----------------------+--------+ +----------+--------+-------+--------+-------------------+  PSV cm/sEDV cmsDescribeArm Pressure (mmHG) +----------+--------+-------+--------+-------------------+ Subclavian127                                        +----------+--------+-------+--------+-------------------+ +---------+--------+--+--------+-+---------+ VertebralPSV cm/s43EDV cm/s6Antegrade +---------+--------+--+--------+-+---------+  Left Carotid Findings: +----------+--------+--------+--------+-----------------------+--------+           PSV cm/sEDV cm/sStenosisPlaque Description     Comments +----------+--------+--------+--------+-----------------------+--------+ CCA Prox  92      12              smooth and heterogenoustortuous  +----------+--------+--------+--------+-----------------------+--------+ CCA Distal64      10              smooth and heterogenous         +----------+--------+--------+--------+-----------------------+--------+ ICA Prox  53      11              smooth and heterogenous         +----------+--------+--------+--------+-----------------------+--------+ ICA Distal57      12                                              +----------+--------+--------+--------+-----------------------+--------+ ECA       51      4                                               +----------+--------+--------+--------+-----------------------+--------+ +----------+--------+--------+--------+-------------------+           PSV cm/sEDV cm/sDescribeArm Pressure (mmHG) +----------+--------+--------+--------+-------------------+ [id number]                                         +----------+--------+--------+--------+-------------------+ +---------+--------+--+--------+-+---------+ VertebralPSV cm/s37EDV cm/s8Antegrade +---------+--------+--+--------+-+---------+   Summary: Right Carotid: Velocities in the right ICA are consistent with a 1-39% stenosis. Left Carotid: Velocities in the left ICA are consistent with a 1-39% stenosis. Vertebrals: Bilateral vertebral arteries demonstrate antegrade flow. *See table(s) above for measurements and observations.     Preliminary    DG Chest Portable 1 View  Result Date: 05/07/2022 CLINICAL DATA:  Chest pain. EXAM: PORTABLE CHEST 1 VIEW COMPARISON:  05/06/2022. FINDINGS: Clear lungs. Atherosclerotic calcifications of the aortic arch and tortuosity of the descending thoracic aorta. Normal heart size. No pleural effusion pneumothorax. IMPRESSION: 1. No evidence of acute cardiopulmonary disease. 2.  Aortic Atherosclerosis (ICD10-I70.0). Electronically Signed   By: Emmit Alexanders M.D.   On: 05/07/2022 10:58   DG Chest 2 View  Result Date: 05/06/2022 CLINICAL  DATA:  Reason for exam: weight loss - 20 lbs over the last month. Patient c/o weakness and fatigue x 1 month. Nonsmoker. EXAM: CHEST - 2 VIEW COMPARISON:  03/06/2022 FINDINGS: Lungs are clear. Heart size and mediastinal contours are within normal limits. Aortic Atherosclerosis (ICD10-170.0). No effusion. Visualized bones unremarkable. IMPRESSION: No acute cardiopulmonary disease. Electronically Signed   By: Lucrezia Europe M.D.   On: 05/06/2022 14:28    Scheduled Meds:  alfuzosin  10 mg Oral QHS   aspirin EC  81 mg Oral QHS   brimonidine  1 drop Left Eye BID   cycloSPORINE  1 drop Both Eyes BID   dorzolamide-timolol  1 drop Both Eyes BID   enoxaparin (LOVENOX) injection  40 mg Subcutaneous Q24H   insulin aspart  0-15 Units Subcutaneous TID WC   insulin glargine-yfgn  15 Units Subcutaneous Daily   latanoprost  1 drop Left Eye QHS   Netarsudil Dimesylate  1 drop Both Eyes QHS   pantoprazole  40 mg Oral Daily   simvastatin  20 mg Oral q1800   Continuous Infusions:  lactated ringers Stopped (05/07/22 1529)     LOS: 0 days   [name], MD [facility] P2/10/2022, 8:18 AM

## 2022-05-08 NOTE — Progress Notes (Signed)
PT Cancellation Note  Patient Details Name: EIVIN MASCIO MRN: [id number] DOB: [birth year]   Cancelled Treatment:    Reason Eval/Treat Not Completed: Other (comment) DM educator in room with pt/family, will return shortly.  Deniece Ree PT DPT PN2

## 2022-05-08 NOTE — Progress Notes (Signed)
Echocardiogram 2D Echocardiogram has been performed.  Frances Furbish 05/08/2022, 9:34 AM

## 2022-05-08 NOTE — Inpatient Diabetes Management (Addendum)
Inpatient Diabetes Program Recommendations  AACE/ADA: New Consensus Statement on Inpatient Glycemic Control (2015)  Target Ranges:  Prepandial:   less than 140 mg/dL      Peak postprandial:   less than 180 mg/dL (1-2 hours)      Critically ill patients:  140 - 180 mg/dL   Lab Results  Component Value Date   GLUCAP 349 (H) 05/08/2022   HGBA1C 14.5 Repeated and verified X2. (H) 05/06/2022    Review of Glycemic Control  Diabetes history: DM2 (diet controlled) Outpatient Diabetes medications: None Current orders for Inpatient glycemic control: Lantus 15 QD, Novolog 0-15 TID with meals  HgbA1C - 14.5%  Inpatient Diabetes Program Recommendations:    Consider 70/30 12 units QAM before pt is discharged. Then he would start the 75/25 12 units before his dinner meal.  For discharge:  Humalog 75/25 Kwikpen 12 units BID (Order # [id number])  Insulin pen needles (Order # [id number])  Blood glucose meter kit (Order # [id number])  Spoke with patient and family about diabetes diagnosis.  Discussed A1C results (14.5%) and explained what an A1C is and informed patient that his current A1C indicates an average glucose of 385 mg/dl over the past 2-3 months. Discussed basic pathophysiology of DM Type 2, basic home care, importance of checking CBGs and maintaining good CBG control to prevent long-term and short-term complications. Reviewed glucose and A1C goals.  Reviewed signs and symptoms of hyperglycemia and hypoglycemia along with treatment for both. Discussed impact of nutrition, exercise, stress, sickness, and medications on diabetes control. Reviewed Living Well with diabetes booklet and encouraged patient to read through entire book. Pt states he exercises on treadmill every day for 45 minutes. Eats fairly healthy, but does like sweets. Has been drinking sodas, sweet tea, juice. Talked about healthy options for high CHO drinks. Asked patient to check his glucose 3 times per day and to keep a log book of  glucose readings and insulin taken. Explained how the doctor he follows up with can use the log book to continue to make insulin adjustments if needed.  Educated patient, spouse and daughter on insulin pen use at home. Reviewed contents of insulin flexpen starter kit. Reviewed all steps if insulin pen including attachment of needle, 2-unit air shot, dialing up dose, giving injection, removing needle, disposal of sharps, storage of unused insulin, disposal of insulin etc. Patient able to provide successful return demonstration. Also reviewed troubleshooting with insulin pen. MD to give patient Rxs for insulin pens and insulin pen needles. Discussed 75/25 insulin pen and stressed importance of taking with meal (breakfast and dinner). Family had many questions that were all answered. To f/u with PC within a week or two. Expect good compliance.  Thank you. Lorenda Peck, RD, LDN, [place] Inpatient Diabetes Coordinator [phone]

## 2022-05-09 DIAGNOSIS — Z79899 Other long term (current) drug therapy: Secondary | ICD-10-CM | POA: Diagnosis not present

## 2022-05-09 DIAGNOSIS — H401111 Primary open-angle glaucoma, right eye, mild stage: Secondary | ICD-10-CM | POA: Diagnosis not present

## 2022-05-09 DIAGNOSIS — H04123 Dry eye syndrome of bilateral lacrimal glands: Secondary | ICD-10-CM | POA: Diagnosis not present

## 2022-05-09 DIAGNOSIS — H401122 Primary open-angle glaucoma, left eye, moderate stage: Secondary | ICD-10-CM | POA: Diagnosis not present

## 2022-05-09 DIAGNOSIS — E1165 Type 2 diabetes mellitus with hyperglycemia: Secondary | ICD-10-CM | POA: Diagnosis not present

## 2022-05-09 LAB — BASIC METABOLIC PANEL
Anion gap: 7 (ref 5–15)
BUN: 15 mg/dL (ref 8–23)
CO2: 28 mmol/L (ref 22–32)
Calcium: 9.3 mg/dL (ref 8.9–10.3)
Chloride: 101 mmol/L (ref 98–111)
Creatinine, Ser: 1.04 mg/dL (ref 0.61–1.24)
GFR, Estimated: >60 mL/min (ref >60)
Glucose, Bld: 282 mg/dL — ABNORMAL HIGH (ref 70–99)
Potassium: 4.6 mmol/L (ref 3.5–5.1)
Sodium: 136 mmol/L (ref 135–145)

## 2022-05-09 LAB — GLUCOSE, CAPILLARY: Glucose-Capillary: 274 mg/dL — ABNORMAL HIGH (ref 70–99)

## 2022-05-09 MED ORDER — "PEN NEEDLES 3/16"" 31G X 5 MM MISC": 1.0000 | Freq: Two times a day (BID) | 0 refills | Status: DC

## 2022-05-09 MED ORDER — INSULIN LISPRO PROT & LISPRO (75-25 MIX) 100 UNIT/ML KWIKPEN: 15.0000 [IU] | PEN_INJECTOR | Freq: Two times a day (BID) | SUBCUTANEOUS | 0 refills | Status: DC

## 2022-05-09 MED ORDER — BLOOD GLUCOSE METER KIT: 1.0000 | PACK | Freq: Three times a day (TID) | 0 refills | Status: AC

## 2022-05-09 NOTE — TOC Transition Note (Signed)
Transition of Care [facility]) - CM/SW Discharge Note   Patient Details  Name: Douglas [name] MRN: [id number] Date of Birth: 1935-10-07  [facility] CM/SW Contact:  Henrietta Dine, RN Phone Number: 05/09/2022, 9:43 AM   Clinical Narrative:    D/C orders received; Claiborne Billings at [facility] notified that pt will d/c today; no TOC needs.   Final next level of care: [place] Barriers to Discharge: No Barriers Identified   Patient Goals and CMS Choice      Discharge Placement                         Discharge Plan and Services Additional resources added to the After Visit Summary for                              [facility] Agency: [place] Date [place]: 05/08/22 Time [place]: [id number] Representative spoke with at [place]: [place] Determinants of Health ([place]) Interventions SDOH Screenings   Food Insecurity: No Food Insecurity (05/07/2022)  Housing: Low Risk  (05/07/2022)  Transportation Needs: No Transportation Needs (05/07/2022)  Utilities: Not At Risk (05/07/2022)  Alcohol Screen: Low Risk  (07/12/2021)  Depression (PHQ2-9): Low Risk  (05/06/2022)  Financial Resource Strain: Low Risk  (07/12/2021)  Physical Activity: Sufficiently Active (07/12/2021)  Social Connections: Socially Integrated (07/12/2021)  Stress: No Stress Concern Present (07/12/2021)  Tobacco Use: Low Risk  (05/08/2022)     Readmission Risk Interventions     No data to display

## 2022-05-09 NOTE — Plan of Care (Signed)
Patient is stable for discharge. Discharge instructions have been given. Patient is discharged to home with daughter.

## 2022-05-09 NOTE — Discharge Summary (Signed)
Physician Discharge Summary  Douglas [name] [id number] DOB: 06-09-1935 DOA: 05/07/2022  PCP: Douglas Pollen, MD  Admit date: 05/07/2022 Discharge date: 05/09/2022  Admitted From: Home Disposition:  Home  Discharge Condition:Stable CODE STATUS:FULL Diet recommendation:  Carb Modified  Brief/Interim Summary:  Patient is 87 year old male with history of colon polyp, prostate cancer, GERD, hyperlipidemia, type 2 diabetes not on meds who presented to the emergency department for the evaluation of abnormal glucose level.  Patient endorses symptoms of blurry vision, polyuria, polydipsia over 2 weeks.  On presentation UA showed glycosuria, ketonuria.  Blood work showed glucose of 419, normal anion gap, creatinine 1.2.  Patient was admitted for the management of uncontrolled diabetes type 2 with hyperglycemia.  Diabetic coordinator consulted.  He has been provided  insulin education ,monitoring blood sugars and has been started on insulin.  Medically stable for discharge home today.  Following problems were addressed during the hospitalization:  Uncontrolled diabetes type 2 with hyperglycemia: Presented with complaints of blurry vision, polyuria, polydipsia for 2 weeks.  Also report of syncopal episode x 2 at home a week ago.  Apparently not taking any medications at home.  Hemoglobin A1c 14.5.  Diabetic coordinator consulted.  Started on insulin drip initially then  changed to sliding scale and long-acting insulin. He has been prescribed insulin on discharge.  Educated on monitoring blood sugars at home, insulin use.   History of prostate cancer/bladder outlet obstruction: On alfuzosin.  We recommend follow-up with urology as an outpatient   Hyperlipidemia: On simvastatin   History of glaucoma: Continue home eyedrops.  Follow-up with ophthalmology tomorrow at [facility]   Hypokalemia: supplemented with  potassium   Syncope: Likely from dehydration from hyperglycemia causing diuresis.  PT  consulted, recommended home health   Discharge Diagnoses:  Principal Problem:   Uncontrolled type 2 diabetes mellitus with hyperglycemia ([place]) Active Problems:   Prostate cancer ([place])   Dyslipidemia   Bladder outlet obstruction   Atherosclerosis of aorta ([facility])   Glaucoma   Hyperglycemia    Discharge Instructions  Discharge Instructions     Diet Carb Modified   Complete by: As directed    Discharge instructions   Complete by: As directed    1)Please take prescribed medications as instructed 2)Follow up with your PCP within a week 3)Monitor your blood sugars at home.You need to closely follow up with your PCP regarding insulin dose adjustment if needed   Increase activity slowly   Complete by: As directed       Allergies as of 05/09/2022   No Known Allergies      Medication List     TAKE these medications    acetaminophen 500 MG tablet Commonly known as: TYLENOL Take 500-1,000 mg by mouth every 6 (six) hours as needed for mild pain or headache.   albuterol 108 (90 Base) MCG/ACT inhaler Commonly known as: VENTOLIN HFA TAKE 2 PUFFS BY MOUTH EVERY 6 HOURS AS NEEDED FOR WHEEZE OR SHORTNESS OF BREATH What changed: See the new instructions.   alfuzosin 10 MG 24 hr tablet Commonly known as: UROXATRAL Take 10 mg by mouth at bedtime.   aspirin EC 81 MG tablet Take 81 mg by mouth at bedtime. Swallow whole.   blood glucose meter kit and supplies 1 each by Other route 3 (three) times daily.   brimonidine 0.2 % ophthalmic solution Commonly known as: ALPHAGAN Place 1 drop into the left eye 2 (two) times daily.   cetirizine 10 MG tablet Commonly known as: ZYRTEC  TAKE 1 TABLET BY MOUTH EVERY DAY What changed: when to take this   dorzolamide-timolol 2-0.5 % ophthalmic solution Commonly known as: COSOPT Place 1 drop into both eyes 2 (two) times daily.   hydrochlorothiazide 12.5 MG capsule Commonly known as: MICROZIDE Take 12.5 mg by mouth at bedtime.   Insulin  Lispro Prot & Lispro (75-25) 100 UNIT/ML Kwikpen Commonly known as: HUMALOG 75/25 MIX Inject 15 Units into the skin 2 (two) times daily.   latanoprost 0.005 % ophthalmic solution Commonly known as: XALATAN Place 1 drop into both eyes at bedtime.   multivitamin tablet Take 1 tablet by mouth at bedtime.   pantoprazole 40 MG tablet Commonly known as: PROTONIX TAKE 1 TABLET BY MOUTH EVERY DAY What changed: when to take this   Pen Needles 3/16" 31G X 5 MM Misc 1 each by Does not apply route 2 (two) times daily.   Restasis 0.05 % ophthalmic emulsion Generic drug: cycloSPORINE Place 1 drop into both eyes 2 (two) times daily.   Rhopressa 0.02 % Soln Generic drug: Netarsudil Dimesylate Place 1 drop into both eyes at bedtime.   simvastatin 20 MG tablet Commonly known as: ZOCOR TAKE 1 TABLET DAILY AT 6PM. What changed: See the new instructions.   Vitamin D3 50 MCG (2000 UT) Tabs Take 2,000 Units by mouth at bedtime.        Follow-up Information     [facility] Follow up.   Specialty: Home Health Services Why: Your home health has been set up with [facility]. The office will call you with start of care information. If you have any questions or concerns please call the number listed above. Contact information: [address] [place] [address] [phone]         Douglas Pollen, MD. Schedule an appointment as soon as possible for a visit in 1 week(s).   Specialty: Internal Medicine Contact information: [place] [address] [phone]                No Known Allergies  Consultations: None   Procedures/Studies: VAS US CAROTID  Result Date: 05/08/2022 Carotid Arterial Duplex Study Patient Name:  Douglas [name]  Date of Exam:   05/07/2022 Medical Rec #: [id number]       Accession #:    [id number] Date of Birth: [birth year]       Patient Gender: M Patient Age:   56 years Exam Location:  [facility]  Procedure:      VAS US CAROTID Referring Phys: Douglas [name] --------------------------------------------------------------------------------  Indications:       Syncope. Risk Factors:      None. Comparison Study:  No prior studies. Performing Technologist: Oliver Hum RVT  Examination Guidelines: A complete evaluation includes B-mode imaging, spectral Doppler, color Doppler, and power Doppler as needed of all accessible portions of each vessel. Bilateral testing is considered an integral part of a complete examination. Limited examinations for reoccurring indications may be performed as noted.  Right Carotid Findings: +----------+--------+--------+--------+-----------------------+--------+           PSV cm/sEDV cm/sStenosisPlaque Description     Comments +----------+--------+--------+--------+-----------------------+--------+ CCA Prox  57      6               smooth and heterogenoustortuous +----------+--------+--------+--------+-----------------------+--------+ CCA Distal92      14              smooth and heterogenous         +----------+--------+--------+--------+-----------------------+--------+  ICA Prox  64      13              smooth and heterogenous         +----------+--------+--------+--------+-----------------------+--------+ ICA Distal55      12                                              +----------+--------+--------+--------+-----------------------+--------+ ECA       50      4                                               +----------+--------+--------+--------+-----------------------+--------+ +----------+--------+-------+--------+-------------------+           PSV cm/sEDV cmsDescribeArm Pressure (mmHG) +----------+--------+-------+--------+-------------------+ Subclavian127                                        +----------+--------+-------+--------+-------------------+ +---------+--------+--+--------+-+---------+ VertebralPSV cm/s43EDV  cm/s6Antegrade +---------+--------+--+--------+-+---------+  Left Carotid Findings: +----------+--------+--------+--------+-----------------------+--------+           PSV cm/sEDV cm/sStenosisPlaque Description     Comments +----------+--------+--------+--------+-----------------------+--------+ CCA Prox  92      12              smooth and heterogenoustortuous +----------+--------+--------+--------+-----------------------+--------+ CCA Distal64      10              smooth and heterogenous         +----------+--------+--------+--------+-----------------------+--------+ ICA Prox  53      11              smooth and heterogenous         +----------+--------+--------+--------+-----------------------+--------+ ICA Distal57      12                                              +----------+--------+--------+--------+-----------------------+--------+ ECA       51      4                                               +----------+--------+--------+--------+-----------------------+--------+ +----------+--------+--------+--------+-------------------+           PSV cm/sEDV cm/sDescribeArm Pressure (mmHG) +----------+--------+--------+--------+-------------------+ [id number]                                         +----------+--------+--------+--------+-------------------+ +---------+--------+--+--------+-+---------+ VertebralPSV cm/s37EDV cm/s8Antegrade +---------+--------+--+--------+-+---------+   Summary: Right Carotid: Velocities in the right ICA are consistent with a 1-39% stenosis. Left Carotid: Velocities in the left ICA are consistent with a 1-39% stenosis. Vertebrals: Bilateral vertebral arteries demonstrate antegrade flow. *See table(s) above for measurements and observations.  Electronically signed by Jamelle Haring on 05/08/2022 at 2:52:19 PM.    Final    ECHOCARDIOGRAM COMPLETE  Result Date: 05/08/2022    ECHOCARDIOGRAM REPORT   Patient Name:   RENARDO BONOMO Date of Exam: 05/08/2022 Medical  Rec #:  [id number]      Height:       71.0 in Accession #:    [id number]     Weight:       185.4 lb Date of Birth:  09/27/1935      BSA:          2.042 m Patient Age:    4 years       BP:           149/68 mmHg Patient Gender: M              HR:           67 bpm. Exam Location:  Inpatient Procedure: 2D Echo, Cardiac Doppler and Color Doppler Indications:    Syncope  History:        Patient has no prior history of Echocardiogram examinations.                 Risk Factors:Dyslipidemia.  Sonographer:    Phineas Douglas Referring Phys: [id number] [place]  1. Left ventricular ejection fraction, by estimation, is 55 to 60%. The left ventricle has normal function. The left ventricle has no regional wall motion abnormalities. There is mild concentric left ventricular hypertrophy. Left ventricular diastolic parameters are consistent with Grade I diastolic dysfunction (impaired relaxation).  2. Right ventricular systolic function is normal. The right ventricular size is normal. There is normal pulmonary artery systolic pressure.  3. The mitral valve is normal in structure. Mild mitral valve regurgitation. No evidence of mitral stenosis.  4. The aortic valve is tricuspid. Aortic valve regurgitation is not visualized. No aortic stenosis is present.  5. Aortic dilatation noted. There is borderline dilatation of the aortic root, measuring 39 mm.  6. The inferior vena cava is dilated in size with >50% respiratory variability, suggesting right atrial pressure of 8 mmHg. Comparison(s): No prior Echocardiogram. FINDINGS  Left Ventricle: Left ventricular ejection fraction, by estimation, is 55 to 60%. The left ventricle has normal function. The left ventricle has no regional wall motion abnormalities. The left ventricular internal cavity size was normal in size. There is  mild concentric left ventricular hypertrophy. Left ventricular diastolic parameters are consistent with Grade  I diastolic dysfunction (impaired relaxation). Right Ventricle: The right ventricular size is normal. Right ventricular systolic function is normal. There is normal pulmonary artery systolic pressure. The tricuspid regurgitant velocity is 2.19 m/s, and with an assumed right atrial pressure of 8 mmHg,  the estimated right ventricular systolic pressure is [id number] mmHg. Left Atrium: Left atrial size was normal in size. Right Atrium: Right atrial size was normal in size. Pericardium: There is no evidence of pericardial effusion. Mitral Valve: The mitral valve is normal in structure. Mild mitral valve regurgitation. No evidence of mitral valve stenosis. Tricuspid Valve: The tricuspid valve is normal in structure. Tricuspid valve regurgitation is trivial. No evidence of tricuspid stenosis. Aortic Valve: The aortic valve is tricuspid. Aortic valve regurgitation is not visualized. No aortic stenosis is present. Pulmonic Valve: The pulmonic valve was not well visualized. Pulmonic valve regurgitation is not visualized. No evidence of pulmonic stenosis. Aorta: Aortic dilatation noted. There is borderline dilatation of the aortic root, measuring 39 mm. Venous: The inferior vena cava is dilated in size with greater than 50% respiratory variability, suggesting right atrial pressure of 8 mmHg. IAS/Shunts: No atrial level shunt detected by color flow Doppler.  LEFT VENTRICLE PLAX 2D LVIDd:         4.40 cm  Diastology LVIDs:         3.00 cm     LV e' medial:    6.20 cm/s LV PW:         1.30 cm     LV E/e' medial:  13.8 LV IVS:        1.30 cm     LV e' lateral:   8.27 cm/s LVOT diam:     2.00 cm     LV E/e' lateral: 10.3 LV SV:         85 LV SV Index:   41 LVOT Area:     3.14 cm  LV Volumes (MOD) LV vol d, MOD A2C: 92.3 ml LV vol d, MOD A4C: 99.0 ml LV vol s, MOD A2C: 32.6 ml LV vol s, MOD A4C: 35.8 ml LV SV MOD A2C:     59.7 ml LV SV MOD A4C:     99.0 ml LV SV MOD BP:      61.8 ml RIGHT VENTRICLE             IVC RV Basal diam:   3.60 cm     IVC diam: 2.30 cm RV S prime:     15.10 cm/s TAPSE (M-mode): 2.0 cm LEFT ATRIUM             Index        RIGHT ATRIUM           Index LA diam:        3.60 cm 1.76 cm/m   RA Area:     15.40 cm LA Vol (A2C):   35.1 ml 17.19 ml/m  RA Volume:   35.30 ml  17.29 ml/m LA Vol (A4C):   37.5 ml 18.37 ml/m LA Biplane Vol: 36.9 ml 18.07 ml/m  AORTIC VALVE LVOT Vmax:   117.00 cm/s LVOT Vmean:  77.400 cm/s LVOT VTI:    0.269 m  AORTA Ao Root diam: 3.90 cm MITRAL VALVE               TRICUSPID VALVE MV Area (PHT): 2.28 cm    TR Peak grad:   19.2 mmHg MV Decel Time: 333 msec    TR Vmax:        219.00 cm/s MV E velocity: 85.30 cm/s MV A velocity: 96.30 cm/s  SHUNTS MV E/A ratio:  0.89        Systemic VTI:  0.27 m                            Systemic Diam: 2.00 cm Kirk Ruths MD Electronically signed by Kirk Ruths MD Signature Date/Time: 05/08/2022/12:13:29 PM    Final    DG Chest Portable 1 View  Result Date: 05/07/2022 CLINICAL DATA:  Chest pain. EXAM: PORTABLE CHEST 1 VIEW COMPARISON:  05/06/2022. FINDINGS: Clear lungs. Atherosclerotic calcifications of the aortic arch and tortuosity of the descending thoracic aorta. Normal heart size. No pleural effusion pneumothorax. IMPRESSION: 1. No evidence of acute cardiopulmonary disease. 2.  Aortic Atherosclerosis (ICD10-I70.0). Electronically Signed   By: Emmit Alexanders M.D.   On: 05/07/2022 10:58   DG Chest 2 View  Result Date: 05/06/2022 CLINICAL DATA:  Reason for exam: weight loss - 20 lbs over the last month. Patient c/o weakness and fatigue x 1 month. Nonsmoker. EXAM: CHEST - 2 VIEW COMPARISON:  03/06/2022 FINDINGS: Lungs are clear. Heart size and mediastinal contours are within normal limits. Aortic Atherosclerosis (ICD10-170.0). No effusion. Visualized  bones unremarkable. IMPRESSION: No acute cardiopulmonary disease. Electronically Signed   By: Lucrezia Europe M.D.   On: 05/06/2022 14:28      Subjective: Patient seen and examined at bedside today.   Hemodynamically stable for discharge.  Discharge Exam: Vitals:   05/08/22 2100 05/09/22 0503  BP: (!) 156/66 135/71  Pulse: 78 69  Resp: 18 18  Temp: 97.6 F (36.4 C) 98.2 F (36.8 C)  SpO2: 99% 97%   Vitals:   05/08/22 1333 05/08/22 1452 05/08/22 2100 05/09/22 0503  BP: 110/74  (!) 156/66 135/71  Pulse: 77  78 69  Resp: 18  18 18  $ Temp: 98.2 F (36.8 C)  97.6 F (36.4 C) 98.2 F (36.8 C)  TempSrc: Oral  Oral Oral  SpO2: 95%  99% 97%  Weight:  84.1 kg    Height:  5' 11"$  (1.803 m)      General: Pt is alert, awake, not in acute distress Cardiovascular: RRR, S1/S2 +, no rubs, no gallops Respiratory: CTA bilaterally, no wheezing, no rhonchi Abdominal: Soft, NT, ND, bowel sounds + Extremities: no edema, no cyanosis    The results of significant diagnostics from this hospitalization (including imaging, microbiology, ancillary and laboratory) are listed below for reference.     Microbiology: Recent Results (from the past 240 hour(s))  Resp panel by RT-PCR (RSV, Flu A&B, Covid) Anterior Nasal Swab     Status: None   Collection Time: 05/07/22 10:32 AM   Specimen: Anterior Nasal Swab  Result Value Ref Range Status   SARS Coronavirus 2 by RT PCR NEGATIVE NEGATIVE Final    Comment: (NOTE) SARS-CoV-2 target nucleic acids are NOT DETECTED.  The SARS-CoV-2 RNA is generally detectable in upper respiratory specimens during the acute phase of infection. The lowest concentration of SARS-CoV-2 viral copies this assay can detect is 138 copies/mL. A negative result does not preclude SARS-Cov-2 infection and should not be used as the sole basis for treatment or other patient management decisions. A negative result may occur with  improper specimen collection/handling, submission of specimen other than nasopharyngeal swab, presence of viral mutation(s) within the areas targeted by this assay, and inadequate number of viral copies(<138 copies/mL). A negative result must be combined  with clinical observations, patient history, and epidemiological information. The expected result is Negative.  Fact Sheet for Patients:  EntrepreneurPulse.com.au  Fact Sheet for Healthcare Providers:  IncredibleEmployment.be  This test is no t yet approved or cleared by the [place] FDA and  has been authorized for detection and/or diagnosis of SARS-CoV-2 by FDA under an Emergency Use Authorization (EUA). This EUA will remain  in effect (meaning this test can be used) for the duration of the COVID-19 declaration under Section 564(b)(1) of the Act, 21 U.S.C.section 360bbb-3(b)(1), unless the authorization is terminated  or revoked sooner.       Influenza A by PCR NEGATIVE NEGATIVE Final   Influenza B by PCR NEGATIVE NEGATIVE Final    Comment: (NOTE) The Xpert Xpress SARS-CoV-2/FLU/RSV plus assay is intended as an aid in the diagnosis of influenza from Nasopharyngeal swab specimens and should not be used as a sole basis for treatment. Nasal washings and aspirates are unacceptable for Xpert Xpress SARS-CoV-2/FLU/RSV testing.  Fact Sheet for Patients: EntrepreneurPulse.com.au  Fact Sheet for Healthcare Providers: IncredibleEmployment.be  This test is not yet approved or cleared by the [place] FDA and has been authorized for detection and/or diagnosis of SARS-CoV-2 by FDA under an Emergency Use Authorization (EUA). This EUA will remain  in effect (meaning this test can be used) for the duration of the COVID-19 declaration under Section 564(b)(1) of the Act, 21 U.S.C. section 360bbb-3(b)(1), unless the authorization is terminated or revoked.     Resp Syncytial Virus by PCR NEGATIVE NEGATIVE Final    Comment: (NOTE) Fact Sheet for Patients: EntrepreneurPulse.com.au  Fact Sheet for Healthcare Providers: IncredibleEmployment.be  This test is not yet approved  or cleared by the [place] FDA and has been authorized for detection and/or diagnosis of SARS-CoV-2 by FDA under an Emergency Use Authorization (EUA). This EUA will remain in effect (meaning this test can be used) for the duration of the COVID-19 declaration under Section 564(b)(1) of the Act, 21 U.S.C. section 360bbb-3(b)(1), unless the authorization is terminated or revoked.  Performed at [facility], [place] [address], [place], [place] [address]   Urine Culture (for pregnant, neutropenic or urologic patients or patients with an indwelling urinary catheter)     Status: None   Collection Time: 05/07/22 12:08 PM   Specimen: Urine, Clean Catch  Result Value Ref Range Status   Specimen Description   Final    URINE, CLEAN CATCH Performed at [facility], [place] [address] [place], [place] [address]    Special Requests   Final    NONE Performed at [facility], [place] [address], [place], [place] [address]    Culture   Final    NO GROWTH Performed at [facility], [place] [address], [place], [place] [address]    Report Status 05/08/2022 FINAL  Final     Labs: BNP (last 3 results) No results for input(s): "BNP" in the last 8760 hours. Basic Metabolic Panel: Recent Labs  Lab 05/06/22 1412 05/07/22 1047 05/08/22 0523 05/08/22 1415 05/09/22 0437  NA 133* 135 133* 136 136  K 5.2* 3.7 3.4* 4.3 4.6  CL 92* 94* 95* 101 101  CO2 29 26 25 25 28  $ GLUCOSE 502* 419* 308* 263* 282*  BUN 21 19 13 12 15  $ CREATININE 1.29 1.26* 0.82 0.89 1.04  CALCIUM 10.3 10.0 9.2 9.1 9.3   Liver Function Tests: Recent Labs  Lab 05/06/22 1412 05/07/22 1047 05/08/22 0523  AST 18 23 23  $ ALT 26 30 26  $ ALKPHOS 64 57 42  BILITOT 0.7 1.1 1.0  PROT 8.0 8.4* 6.2*  ALBUMIN 4.6 4.3 3.4*   Recent Labs  Lab 05/07/22 1047  LIPASE 46   No results for input(s): "AMMONIA" in the last 168 hours. CBC: Recent Labs  Lab 05/06/22 1412  05/07/22 1047 05/08/22 0523  WBC 6.5 5.5 5.6  NEUTROABS 3.0 2.0 2.0  HGB 13.0 12.8* 11.9*  HCT 38.5* 37.5* 35.0*  MCV 94.9 92.8 93.8  PLT 188.0 173 166   Cardiac Enzymes: No results for input(s): "CKTOTAL", "CKMB", "CKMBINDEX", "TROPONINI" in the last 168 hours. BNP: Invalid input(s): "POCBNP" CBG: Recent Labs  Lab 05/08/22 0730 05/08/22 1122 05/08/22 1615 05/08/22 2102 05/09/22 0733  GLUCAP 331* 349* 278* 245* 274*   D-Dimer Recent Labs    05/07/22 1047  DDIMER 0.50   Hgb A1c Recent Labs    05/06/22 1412  HGBA1C 14.5 Repeated and verified X2.*   Lipid Profile Recent Labs    05/06/22 1412  CHOL 158  HDL 32.30*  TRIG 583.0 Triglyceride is over 400; calculations on Lipids are invalid.*  CHOLHDL 5  LDLDIRECT 43.0   Thyroid function studies Recent Labs    05/07/22 1047  TSH 1.092   Anemia  work up Recent Labs    05/06/22 1412  VITAMINB12 1,209*   Urinalysis    Component Value Date/Time   COLORURINE YELLOW 05/07/2022 [place] 05/07/2022 1208   LABSPEC 1.030 05/07/2022 1208   PHURINE 6.0 05/07/2022 1208   GLUCOSEU >=500 (A) 05/07/2022 1208   GLUCOSEU >=1000 (A) 05/06/2022 1412   HGBUR NEGATIVE 05/07/2022 [place] 05/07/2022 1208   BILIRUBINUR neg 01/03/2013 1051   KETONESUR 20 (A) 05/07/2022 1208   PROTEINUR NEGATIVE 05/07/2022 1208   UROBILINOGEN 0.2 05/06/2022 1412   NITRITE NEGATIVE 05/07/2022 1208   LEUKOCYTESUR NEGATIVE 05/07/2022 1208   Sepsis Labs Recent Labs  Lab 05/06/22 1412 05/07/22 1047 05/08/22 0523  WBC 6.5 5.5 5.6   Microbiology Recent Results (from the past 240 hour(s))  Resp panel by RT-PCR (RSV, Flu A&B, Covid) Anterior Nasal Swab     Status: None   Collection Time: 05/07/22 10:32 AM   Specimen: Anterior Nasal Swab  Result Value Ref Range Status   SARS Coronavirus 2 by RT PCR NEGATIVE NEGATIVE Final    Comment: (NOTE) SARS-CoV-2 target nucleic acids are NOT DETECTED.  The  SARS-CoV-2 RNA is generally detectable in upper respiratory specimens during the acute phase of infection. The lowest concentration of SARS-CoV-2 viral copies this assay can detect is 138 copies/mL. A negative result does not preclude SARS-Cov-2 infection and should not be used as the sole basis for treatment or other patient management decisions. A negative result may occur with  improper specimen collection/handling, submission of specimen other than nasopharyngeal swab, presence of viral mutation(s) within the areas targeted by this assay, and inadequate number of viral copies(<138 copies/mL). A negative result must be combined with clinical observations, patient history, and epidemiological information. The expected result is Negative.  Fact Sheet for Patients:  EntrepreneurPulse.com.au  Fact Sheet for Healthcare Providers:  IncredibleEmployment.be  This test is no t yet approved or cleared by the [place] FDA and  has been authorized for detection and/or diagnosis of SARS-CoV-2 by FDA under an Emergency Use Authorization (EUA). This EUA will remain  in effect (meaning this test can be used) for the duration of the COVID-19 declaration under Section 564(b)(1) of the Act, 21 U.S.C.section 360bbb-3(b)(1), unless the authorization is terminated  or revoked sooner.       Influenza A by PCR NEGATIVE NEGATIVE Final   Influenza B by PCR NEGATIVE NEGATIVE Final    Comment: (NOTE) The Xpert Xpress SARS-CoV-2/FLU/RSV plus assay is intended as an aid in the diagnosis of influenza from Nasopharyngeal swab specimens and should not be used as a sole basis for treatment. Nasal washings and aspirates are unacceptable for Xpert Xpress SARS-CoV-2/FLU/RSV testing.  Fact Sheet for Patients: EntrepreneurPulse.com.au  Fact Sheet for Healthcare Providers: IncredibleEmployment.be  This test is not yet approved or  cleared by the [place] FDA and has been authorized for detection and/or diagnosis of SARS-CoV-2 by FDA under an Emergency Use Authorization (EUA). This EUA will remain in effect (meaning this test can be used) for the duration of the COVID-19 declaration under Section 564(b)(1) of the Act, 21 U.S.C. section 360bbb-3(b)(1), unless the authorization is terminated or revoked.     Resp Syncytial Virus by PCR NEGATIVE NEGATIVE Final    Comment: (NOTE) Fact Sheet for Patients: EntrepreneurPulse.com.au  Fact Sheet for Healthcare Providers: IncredibleEmployment.be  This test is not yet approved or cleared by the [place] FDA and has been authorized for detection and/or diagnosis of SARS-CoV-2 by FDA  under an Emergency Use Authorization (EUA). This EUA will remain in effect (meaning this test can be used) for the duration of the COVID-19 declaration under Section 564(b)(1) of the Act, 21 U.S.C. section 360bbb-3(b)(1), unless the authorization is terminated or revoked.  Performed at [facility], [place] [address] [place], [place] [address]   Urine Culture (for pregnant, neutropenic or urologic patients or patients with an indwelling urinary catheter)     Status: None   Collection Time: 05/07/22 12:08 PM   Specimen: Urine, Clean Catch  Result Value Ref Range Status   Specimen Description   Final    URINE, CLEAN CATCH Performed at [facility], [place] [address], [place], [place] [address]    Special Requests   Final    NONE Performed at [facility], [place] [address] [place], [place] [address]    Culture   Final    NO GROWTH Performed at [facility], [place] [address], [place],  [address]    Report Status 05/08/2022 FINAL  Final    Please note: You were cared for by a hospitalist during your hospital stay. Once you are discharged, your primary care physician will handle  any further medical issues. Please note that NO REFILLS for any discharge medications will be authorized once you are discharged, as it is imperative that you return to your primary care physician (or establish a relationship with a primary care physician if you do not have one) for your post hospital discharge needs so that they can reassess your need for medications and monitor your lab values.    Time coordinating discharge: 40 minutes  SIGNED:   [name], MD  [facility] 05/09/2022, 9:01 AM Pager [phone]  If 7PM-7AM, please contact night-coverage [email or web address] Physician Discharge Summary  Douglas [name] [id number] DOB: 1936-02-18 DOA: 05/07/2022  PCP: Douglas Pollen, MD  Admit date: 05/07/2022 Discharge date: 05/09/2022  Admitted From: Home Disposition:  Home  Discharge Condition:Stable CODE STATUS:FULL, DNR, Comfort Care Diet recommendation: Heart Healthy / Carb Modified / Regular / Dysphagia   Brief/Interim Summary:   Following problems were addressed during the hospitalization:   Discharge Diagnoses:  Principal Problem:   Uncontrolled type 2 diabetes mellitus with hyperglycemia ([place]) Active Problems:   Prostate cancer ([place])   Dyslipidemia   Bladder outlet obstruction   Atherosclerosis of aorta ([place])   Glaucoma   Hyperglycemia    Discharge Instructions  Discharge Instructions     Diet Carb Modified   Complete by: As directed    Discharge instructions   Complete by: As directed    1)Please take prescribed medications as instructed 2)Follow up with your PCP within a week 3)Monitor your blood sugars at home.You need to closely follow up with your PCP regarding insulin dose adjustment if needed   Increase activity slowly   Complete by: As directed       Allergies as of 05/09/2022   No Known Allergies      Medication List     TAKE these medications    acetaminophen 500 MG tablet Commonly known as:  TYLENOL Take 500-1,000 mg by mouth every 6 (six) hours as needed for mild pain or headache.   albuterol 108 (90 Base) MCG/ACT inhaler Commonly known as: VENTOLIN HFA TAKE 2 PUFFS BY MOUTH EVERY 6 HOURS AS NEEDED FOR WHEEZE OR SHORTNESS OF BREATH What changed: See the new instructions.   alfuzosin 10 MG 24 hr tablet Commonly known as:  UROXATRAL Take 10 mg by mouth at bedtime.   aspirin EC 81 MG tablet Take 81 mg by mouth at bedtime. Swallow whole.   blood glucose meter kit and supplies 1 each by Other route 3 (three) times daily.   brimonidine 0.2 % ophthalmic solution Commonly known as: ALPHAGAN Place 1 drop into the left eye 2 (two) times daily.   cetirizine 10 MG tablet Commonly known as: ZYRTEC TAKE 1 TABLET BY MOUTH EVERY DAY What changed: when to take this   dorzolamide-timolol 2-0.5 % ophthalmic solution Commonly known as: COSOPT Place 1 drop into both eyes 2 (two) times daily.   hydrochlorothiazide 12.5 MG capsule Commonly known as: MICROZIDE Take 12.5 mg by mouth at bedtime.   Insulin Lispro Prot & Lispro (75-25) 100 UNIT/ML Kwikpen Commonly known as: HUMALOG 75/25 MIX Inject 15 Units into the skin 2 (two) times daily.   latanoprost 0.005 % ophthalmic solution Commonly known as: XALATAN Place 1 drop into both eyes at bedtime.   multivitamin tablet Take 1 tablet by mouth at bedtime.   pantoprazole 40 MG tablet Commonly known as: PROTONIX TAKE 1 TABLET BY MOUTH EVERY DAY What changed: when to take this   Pen Needles 3/16" 31G X 5 MM Misc 1 each by Does not apply route 2 (two) times daily.   Restasis 0.05 % ophthalmic emulsion Generic drug: cycloSPORINE Place 1 drop into both eyes 2 (two) times daily.   Rhopressa 0.02 % Soln Generic drug: Netarsudil Dimesylate Place 1 drop into both eyes at bedtime.   simvastatin 20 MG tablet Commonly known as: ZOCOR TAKE 1 TABLET DAILY AT 6PM. What changed: See the new instructions.   Vitamin D3 50 MCG (2000  UT) Tabs Take 2,000 Units by mouth at bedtime.        Follow-up Information     [facility], [place] Follow up.   Specialty: Home Health Services Why: Your home health has been set up with [facility]. The office will call you with start of care information. If you have any questions or concerns please call the number listed above. Contact information: [address] [place] [address] [phone]         Douglas Pollen, MD. Schedule an appointment as soon as possible for a visit in 1 week(s).   Specialty: Internal Medicine Contact information: [place] [address] [phone]                No Known Allergies  Consultations:    Procedures/Studies: VAS US CAROTID  Result Date: 05/08/2022 Carotid Arterial Duplex Study Patient Name:  DUNCAN BLOYER  Date of Exam:   05/07/2022 Medical Rec #: [id number]       Accession #:    [id number] Date of Birth: 10-15-1935       Patient Gender: M Patient Age:   86 years Exam Location:  [facility] Procedure:      VAS US CAROTID Referring Phys: Douglas [name] --------------------------------------------------------------------------------  Indications:       Syncope. Risk Factors:      None. Comparison Study:  No prior studies. Performing Technologist: Oliver Hum RVT  Examination Guidelines: A complete evaluation includes B-mode imaging, spectral Doppler, color Doppler, and power Doppler as needed of all accessible portions of each vessel. Bilateral testing is considered an integral part of a complete examination. Limited examinations for reoccurring indications may be performed as noted.  Right Carotid Findings: +----------+--------+--------+--------+-----------------------+--------+  PSV cm/sEDV cm/sStenosisPlaque Description     Comments +----------+--------+--------+--------+-----------------------+--------+ CCA Prox  57      6               smooth and  heterogenoustortuous +----------+--------+--------+--------+-----------------------+--------+ CCA Distal92      14              smooth and heterogenous         +----------+--------+--------+--------+-----------------------+--------+ ICA Prox  64      13              smooth and heterogenous         +----------+--------+--------+--------+-----------------------+--------+ ICA Distal55      12                                              +----------+--------+--------+--------+-----------------------+--------+ ECA       50      4                                               +----------+--------+--------+--------+-----------------------+--------+ +----------+--------+-------+--------+-------------------+           PSV cm/sEDV cmsDescribeArm Pressure (mmHG) +----------+--------+-------+--------+-------------------+ Subclavian127                                        +----------+--------+-------+--------+-------------------+ +---------+--------+--+--------+-+---------+ VertebralPSV cm/s43EDV cm/s6Antegrade +---------+--------+--+--------+-+---------+  Left Carotid Findings: +----------+--------+--------+--------+-----------------------+--------+           PSV cm/sEDV cm/sStenosisPlaque Description     Comments +----------+--------+--------+--------+-----------------------+--------+ CCA Prox  92      12              smooth and heterogenoustortuous +----------+--------+--------+--------+-----------------------+--------+ CCA Distal64      10              smooth and heterogenous         +----------+--------+--------+--------+-----------------------+--------+ ICA Prox  53      11              smooth and heterogenous         +----------+--------+--------+--------+-----------------------+--------+ ICA Distal57      12                                              +----------+--------+--------+--------+-----------------------+--------+ ECA        51      4                                               +----------+--------+--------+--------+-----------------------+--------+ +----------+--------+--------+--------+-------------------+           PSV cm/sEDV cm/sDescribeArm Pressure (mmHG) +----------+--------+--------+--------+-------------------+ [id number]                                         +----------+--------+--------+--------+-------------------+ +---------+--------+--+--------+-+---------+ VertebralPSV cm/s37EDV cm/s8Antegrade +---------+--------+--+--------+-+---------+   Summary: Right Carotid: Velocities in the right ICA are consistent with a 1-39%  stenosis. Left Carotid: Velocities in the left ICA are consistent with a 1-39% stenosis. Vertebrals: Bilateral vertebral arteries demonstrate antegrade flow. *See table(s) above for measurements and observations.  Electronically signed by Jamelle Haring on 05/08/2022 at 2:52:19 PM.    Final    ECHOCARDIOGRAM COMPLETE  Result Date: 05/08/2022    ECHOCARDIOGRAM REPORT   Patient Name:   LETROY DEBORDE Date of Exam: 05/08/2022 Medical Rec #:  [id number]      Height:       71.0 in Accession #:    [id number]     Weight:       185.4 lb Date of Birth:  June 08, 1935      BSA:          2.042 m Patient Age:    38 years       BP:           149/68 mmHg Patient Gender: M              HR:           67 bpm. Exam Location:  Inpatient Procedure: 2D Echo, Cardiac Doppler and Color Doppler Indications:    Syncope  History:        Patient has no prior history of Echocardiogram examinations.                 Risk Factors:Dyslipidemia.  Sonographer:    Phineas Douglas Referring Phys: [id number] [place]  1. Left ventricular ejection fraction, by estimation, is 55 to 60%. The left ventricle has normal function. The left ventricle has no regional wall motion abnormalities. There is mild concentric left ventricular hypertrophy. Left ventricular diastolic parameters are consistent with Grade  I diastolic dysfunction (impaired relaxation).  2. Right ventricular systolic function is normal. The right ventricular size is normal. There is normal pulmonary artery systolic pressure.  3. The mitral valve is normal in structure. Mild mitral valve regurgitation. No evidence of mitral stenosis.  4. The aortic valve is tricuspid. Aortic valve regurgitation is not visualized. No aortic stenosis is present.  5. Aortic dilatation noted. There is borderline dilatation of the aortic root, measuring 39 mm.  6. The inferior vena cava is dilated in size with >50% respiratory variability, suggesting right atrial pressure of 8 mmHg. Comparison(s): No prior Echocardiogram. FINDINGS  Left Ventricle: Left ventricular ejection fraction, by estimation, is 55 to 60%. The left ventricle has normal function. The left ventricle has no regional wall motion abnormalities. The left ventricular internal cavity size was normal in size. There is  mild concentric left ventricular hypertrophy. Left ventricular diastolic parameters are consistent with Grade I diastolic dysfunction (impaired relaxation). Right Ventricle: The right ventricular size is normal. Right ventricular systolic function is normal. There is normal pulmonary artery systolic pressure. The tricuspid regurgitant velocity is 2.19 m/s, and with an assumed right atrial pressure of 8 mmHg,  the estimated right ventricular systolic pressure is [id number] mmHg. Left Atrium: Left atrial size was normal in size. Right Atrium: Right atrial size was normal in size. Pericardium: There is no evidence of pericardial effusion. Mitral Valve: The mitral valve is normal in structure. Mild mitral valve regurgitation. No evidence of mitral valve stenosis. Tricuspid Valve: The tricuspid valve is normal in structure. Tricuspid valve regurgitation is trivial. No evidence of tricuspid stenosis. Aortic Valve: The aortic valve is tricuspid. Aortic valve regurgitation is not visualized. No aortic stenosis  is present. Pulmonic Valve: The pulmonic valve was not well visualized. Pulmonic valve regurgitation  is not visualized. No evidence of pulmonic stenosis. Aorta: Aortic dilatation noted. There is borderline dilatation of the aortic root, measuring 39 mm. Venous: The inferior vena cava is dilated in size with greater than 50% respiratory variability, suggesting right atrial pressure of 8 mmHg. IAS/Shunts: No atrial level shunt detected by color flow Doppler.  LEFT VENTRICLE PLAX 2D LVIDd:         4.40 cm     Diastology LVIDs:         3.00 cm     LV e' medial:    6.20 cm/s LV PW:         1.30 cm     LV E/e' medial:  13.8 LV IVS:        1.30 cm     LV e' lateral:   8.27 cm/s LVOT diam:     2.00 cm     LV E/e' lateral: 10.3 LV SV:         85 LV SV Index:   41 LVOT Area:     3.14 cm  LV Volumes (MOD) LV vol d, MOD A2C: 92.3 ml LV vol d, MOD A4C: 99.0 ml LV vol s, MOD A2C: 32.6 ml LV vol s, MOD A4C: 35.8 ml LV SV MOD A2C:     59.7 ml LV SV MOD A4C:     99.0 ml LV SV MOD BP:      61.8 ml RIGHT VENTRICLE             IVC RV Basal diam:  3.60 cm     IVC diam: 2.30 cm RV S prime:     15.10 cm/s TAPSE (M-mode): 2.0 cm LEFT ATRIUM             Index        RIGHT ATRIUM           Index LA diam:        3.60 cm 1.76 cm/m   RA Area:     15.40 cm LA Vol (A2C):   35.1 ml 17.19 ml/m  RA Volume:   35.30 ml  17.29 ml/m LA Vol (A4C):   37.5 ml 18.37 ml/m LA Biplane Vol: 36.9 ml 18.07 ml/m  AORTIC VALVE LVOT Vmax:   117.00 cm/s LVOT Vmean:  77.400 cm/s LVOT VTI:    0.269 m  AORTA Ao Root diam: 3.90 cm MITRAL VALVE               TRICUSPID VALVE MV Area (PHT): 2.28 cm    TR Peak grad:   19.2 mmHg MV Decel Time: 333 msec    TR Vmax:        219.00 cm/s MV E velocity: 85.30 cm/s MV A velocity: 96.30 cm/s  SHUNTS MV E/A ratio:  0.89        Systemic VTI:  0.27 m                            Systemic Diam: 2.00 cm Kirk Ruths MD Electronically signed by Kirk Ruths MD Signature Date/Time: 05/08/2022/12:13:29 PM    Final    DG Chest  Portable 1 View  Result Date: 05/07/2022 CLINICAL DATA:  Chest pain. EXAM: PORTABLE CHEST 1 VIEW COMPARISON:  05/06/2022. FINDINGS: Clear lungs. Atherosclerotic calcifications of the aortic arch and tortuosity of the descending thoracic aorta. Normal heart size. No pleural effusion pneumothorax. IMPRESSION: 1. No evidence of acute cardiopulmonary disease. 2.  Aortic Atherosclerosis (ICD10-I70.0).  Electronically Signed   By: Emmit Alexanders M.D.   On: 05/07/2022 10:58   DG Chest 2 View  Result Date: 05/06/2022 CLINICAL DATA:  Reason for exam: weight loss - 20 lbs over the last month. Patient c/o weakness and fatigue x 1 month. Nonsmoker. EXAM: CHEST - 2 VIEW COMPARISON:  03/06/2022 FINDINGS: Lungs are clear. Heart size and mediastinal contours are within normal limits. Aortic Atherosclerosis (ICD10-170.0). No effusion. Visualized bones unremarkable. IMPRESSION: No acute cardiopulmonary disease. Electronically Signed   By: Lucrezia Europe M.D.   On: 05/06/2022 14:28      Subjective:   Discharge Exam: Vitals:   05/08/22 2100 05/09/22 0503  BP: (!) 156/66 135/71  Pulse: 78 69  Resp: 18 18  Temp: 97.6 F (36.4 C) 98.2 F (36.8 C)  SpO2: 99% 97%   Vitals:   05/08/22 1333 05/08/22 1452 05/08/22 2100 05/09/22 0503  BP: 110/74  (!) 156/66 135/71  Pulse: 77  78 69  Resp: 18  18 18  $ Temp: 98.2 F (36.8 C)  97.6 F (36.4 C) 98.2 F (36.8 C)  TempSrc: Oral  Oral Oral  SpO2: 95%  99% 97%  Weight:  84.1 kg    Height:  5' 11"$  (1.803 m)      General: Pt is alert, awake, not in acute distress Cardiovascular: RRR, S1/S2 +, no rubs, no gallops Respiratory: CTA bilaterally, no wheezing, no rhonchi Abdominal: Soft, NT, ND, bowel sounds + Extremities: no edema, no cyanosis    The results of significant diagnostics from this hospitalization (including imaging, microbiology, ancillary and laboratory) are listed below for reference.     Microbiology: Recent Results (from the past 240 hour(s))   Resp panel by RT-PCR (RSV, Flu A&B, Covid) Anterior Nasal Swab     Status: None   Collection Time: 05/07/22 10:32 AM   Specimen: Anterior Nasal Swab  Result Value Ref Range Status   SARS Coronavirus 2 by RT PCR NEGATIVE NEGATIVE Final    Comment: (NOTE) SARS-CoV-2 target nucleic acids are NOT DETECTED.  The SARS-CoV-2 RNA is generally detectable in upper respiratory specimens during the acute phase of infection. The lowest concentration of SARS-CoV-2 viral copies this assay can detect is 138 copies/mL. A negative result does not preclude SARS-Cov-2 infection and should not be used as the sole basis for treatment or other patient management decisions. A negative result may occur with  improper specimen collection/handling, submission of specimen other than nasopharyngeal swab, presence of viral mutation(s) within the areas targeted by this assay, and inadequate number of viral copies(<138 copies/mL). A negative result must be combined with clinical observations, patient history, and epidemiological information. The expected result is Negative.  Fact Sheet for Patients:  EntrepreneurPulse.com.au  Fact Sheet for Healthcare Providers:  IncredibleEmployment.be  This test is no t yet approved or cleared by the [place] FDA and  has been authorized for detection and/or diagnosis of SARS-CoV-2 by FDA under an Emergency Use Authorization (EUA). This EUA will remain  in effect (meaning this test can be used) for the duration of the COVID-19 declaration under Section 564(b)(1) of the Act, 21 U.S.C.section 360bbb-3(b)(1), unless the authorization is terminated  or revoked sooner.       Influenza A by PCR NEGATIVE NEGATIVE Final   Influenza B by PCR NEGATIVE NEGATIVE Final    Comment: (NOTE) The Xpert Xpress SARS-CoV-2/FLU/RSV plus assay is intended as an aid in the diagnosis of influenza from Nasopharyngeal swab specimens and should not be used  as a  sole basis for treatment. Nasal washings and aspirates are unacceptable for Xpert Xpress SARS-CoV-2/FLU/RSV testing.  Fact Sheet for Patients: EntrepreneurPulse.com.au  Fact Sheet for Healthcare Providers: IncredibleEmployment.be  This test is not yet approved or cleared by the [place] FDA and has been authorized for detection and/or diagnosis of SARS-CoV-2 by FDA under an Emergency Use Authorization (EUA). This EUA will remain in effect (meaning this test can be used) for the duration of the COVID-19 declaration under Section 564(b)(1) of the Act, 21 U.S.C. section 360bbb-3(b)(1), unless the authorization is terminated or revoked.     Resp Syncytial Virus by PCR NEGATIVE NEGATIVE Final    Comment: (NOTE) Fact Sheet for Patients: EntrepreneurPulse.com.au  Fact Sheet for Healthcare Providers: IncredibleEmployment.be  This test is not yet approved or cleared by the [place] FDA and has been authorized for detection and/or diagnosis of SARS-CoV-2 by FDA under an Emergency Use Authorization (EUA). This EUA will remain in effect (meaning this test can be used) for the duration of the COVID-19 declaration under Section 564(b)(1) of the Act, 21 U.S.C. section 360bbb-3(b)(1), unless the authorization is terminated or revoked.  Performed at [facility], [place] [address], [place], [place] [address]   Urine Culture (for pregnant, neutropenic or urologic patients or patients with an indwelling urinary catheter)     Status: None   Collection Time: 05/07/22 12:08 PM   Specimen: Urine, Clean Catch  Result Value Ref Range Status   Specimen Description   Final    URINE, CLEAN CATCH Performed at [facility], [place] [address], [place], [place] [address]    Special Requests   Final    NONE Performed at [facility], [place] [address] [place],  [place] [address]    Culture   Final    NO GROWTH Performed at [facility], [place] [address] [place], [place] [address]    Report Status 05/08/2022 FINAL  Final     Labs: BNP (last 3 results) No results for input(s): "BNP" in the last 8760 hours. Basic Metabolic Panel: Recent Labs  Lab 05/06/22 1412 05/07/22 1047 05/08/22 0523 05/08/22 1415 05/09/22 0437  NA 133* 135 133* 136 136  K 5.2* 3.7 3.4* 4.3 4.6  CL 92* 94* 95* 101 101  CO2 29 26 25 25 28  $ GLUCOSE 502* 419* 308* 263* 282*  BUN 21 19 13 12 15  $ CREATININE 1.29 1.26* 0.82 0.89 1.04  CALCIUM 10.3 10.0 9.2 9.1 9.3   Liver Function Tests: Recent Labs  Lab 05/06/22 1412 05/07/22 1047 05/08/22 0523  AST 18 23 23  $ ALT 26 30 26  $ ALKPHOS 64 57 42  BILITOT 0.7 1.1 1.0  PROT 8.0 8.4* 6.2*  ALBUMIN 4.6 4.3 3.4*   Recent Labs  Lab 05/07/22 1047  LIPASE 46   No results for input(s): "AMMONIA" in the last 168 hours. CBC: Recent Labs  Lab 05/06/22 1412 05/07/22 1047 05/08/22 0523  WBC 6.5 5.5 5.6  NEUTROABS 3.0 2.0 2.0  HGB 13.0 12.8* 11.9*  HCT 38.5* 37.5* 35.0*  MCV 94.9 92.8 93.8  PLT 188.0 173 166   Cardiac Enzymes: No results for input(s): "CKTOTAL", "CKMB", "CKMBINDEX", "TROPONINI" in the last 168 hours. BNP: Invalid input(s): "POCBNP" CBG: Recent Labs  Lab 05/08/22 0730 05/08/22 1122 05/08/22 1615 05/08/22 2102 05/09/22 0733  GLUCAP 331* 349* 278* 245* 274*   D-Dimer Recent Labs    05/07/22 1047  DDIMER 0.50   Hgb A1c Recent Labs  05/06/22 1412  HGBA1C 14.5 Repeated and verified X2.*   Lipid Profile Recent Labs    05/06/22 1412  CHOL 158  HDL 32.30*  TRIG 583.0 Triglyceride is over 400; calculations on Lipids are invalid.*  CHOLHDL 5  LDLDIRECT 43.0   Thyroid function studies Recent Labs    05/07/22 1047  TSH 1.092   Anemia work up Recent Labs    05/06/22 1412  VITAMINB12 1,209*   Urinalysis    Component Value Date/Time   COLORURINE YELLOW 05/07/2022 [place] 05/07/2022 1208   LABSPEC 1.030 05/07/2022 1208   PHURINE 6.0 05/07/2022 1208   GLUCOSEU >=500 (A) 05/07/2022 1208   GLUCOSEU >=1000 (A) 05/06/2022 1412   HGBUR NEGATIVE 05/07/2022 [place] 05/07/2022 1208   BILIRUBINUR neg 01/03/2013 1051   KETONESUR 20 (A) 05/07/2022 1208   PROTEINUR NEGATIVE 05/07/2022 1208   UROBILINOGEN 0.2 05/06/2022 1412   NITRITE NEGATIVE 05/07/2022 1208   LEUKOCYTESUR NEGATIVE 05/07/2022 1208   Sepsis Labs Recent Labs  Lab 05/06/22 1412 05/07/22 1047 05/08/22 0523  WBC 6.5 5.5 5.6   Microbiology Recent Results (from the past 240 hour(s))  Resp panel by RT-PCR (RSV, Flu A&B, Covid) Anterior Nasal Swab     Status: None   Collection Time: 05/07/22 10:32 AM   Specimen: Anterior Nasal Swab  Result Value Ref Range Status   SARS Coronavirus 2 by RT PCR NEGATIVE NEGATIVE Final    Comment: (NOTE) SARS-CoV-2 target nucleic acids are NOT DETECTED.  The SARS-CoV-2 RNA is generally detectable in upper respiratory specimens during the acute phase of infection. The lowest concentration of SARS-CoV-2 viral copies this assay can detect is 138 copies/mL. A negative result does not preclude SARS-Cov-2 infection and should not be used as the sole basis for treatment or other patient management decisions. A negative result may occur with  improper specimen collection/handling, submission of specimen other than nasopharyngeal swab, presence of viral mutation(s) within the areas targeted by this assay, and inadequate number of viral copies(<138 copies/mL). A negative result must be combined with clinical observations, patient history, and epidemiological information. The expected result is Negative.  Fact Sheet for Patients:  EntrepreneurPulse.com.au  Fact Sheet for Healthcare Providers:  IncredibleEmployment.be  This test is no t yet approved or cleared by the [place] FDA and  has  been authorized for detection and/or diagnosis of SARS-CoV-2 by FDA under an Emergency Use Authorization (EUA). This EUA will remain  in effect (meaning this test can be used) for the duration of the COVID-19 declaration under Section 564(b)(1) of the Act, 21 U.S.C.section 360bbb-3(b)(1), unless the authorization is terminated  or revoked sooner.       Influenza A by PCR NEGATIVE NEGATIVE Final   Influenza B by PCR NEGATIVE NEGATIVE Final    Comment: (NOTE) The Xpert Xpress SARS-CoV-2/FLU/RSV plus assay is intended as an aid in the diagnosis of influenza from Nasopharyngeal swab specimens and should not be used as a sole basis for treatment. Nasal washings and aspirates are unacceptable for Xpert Xpress SARS-CoV-2/FLU/RSV testing.  Fact Sheet for Patients: EntrepreneurPulse.com.au  Fact Sheet for Healthcare Providers: IncredibleEmployment.be  This test is not yet approved or cleared by the [place] FDA and has been authorized for detection and/or diagnosis of SARS-CoV-2 by FDA under an Emergency Use Authorization (EUA). This EUA will remain in effect (meaning this test can be used) for the duration of the COVID-19 declaration under Section 564(b)(1) of the Act, 21 U.S.C. section 360bbb-3(b)(1),  unless the authorization is terminated or revoked.     Resp Syncytial Virus by PCR NEGATIVE NEGATIVE Final    Comment: (NOTE) Fact Sheet for Patients: EntrepreneurPulse.com.au  Fact Sheet for Healthcare Providers: IncredibleEmployment.be  This test is not yet approved or cleared by the [place] FDA and has been authorized for detection and/or diagnosis of SARS-CoV-2 by FDA under an Emergency Use Authorization (EUA). This EUA will remain in effect (meaning this test can be used) for the duration of the COVID-19 declaration under Section 564(b)(1) of the Act, 21 U.S.C. section 360bbb-3(b)(1), unless the  authorization is terminated or revoked.  Performed at [facility], [place] [address], [place], [place] [address]   Urine Culture (for pregnant, neutropenic or urologic patients or patients with an indwelling urinary catheter)     Status: None   Collection Time: 05/07/22 12:08 PM   Specimen: Urine, Clean Catch  Result Value Ref Range Status   Specimen Description   Final    URINE, CLEAN CATCH Performed at [facility], [place] [address] [place], [place] [address]    Special Requests   Final    NONE Performed at [facility], [place] [address] [place], [place] [address]    Culture   Final    NO GROWTH Performed at [facility], [place] [address], [place],  [address]    Report Status 05/08/2022 FINAL  Final    Please note: You were cared for by a hospitalist during your hospital stay. Once you are discharged, your primary care physician will handle any further medical issues. Please note that NO REFILLS for any discharge medications will be authorized once you are discharged, as it is imperative that you return to your primary care physician (or establish a relationship with a primary care physician if you do not have one) for your post hospital discharge needs so that they can reassess your need for medications and monitor your lab values.    Time coordinating discharge: 40 minutes  SIGNED:   [name], MD  [facility] 05/09/2022, 9:01 AM Pager [phone]  If 7PM-7AM, please contact night-coverage [email or web address] Password [email or web address]

## 2022-05-12 ENCOUNTER — Ambulatory Visit (INDEPENDENT_AMBULATORY_CARE_PROVIDER_SITE_OTHER): Payer: Medicare Other | Admitting: Emergency Medicine

## 2022-05-12 ENCOUNTER — Encounter: Payer: Self-pay | Admitting: Emergency Medicine

## 2022-05-12 ENCOUNTER — Telehealth: Payer: Self-pay

## 2022-05-12 ENCOUNTER — Inpatient Hospital Stay: Payer: Medicare Other | Admitting: Emergency Medicine

## 2022-05-12 VITALS — BP 130/68 | HR 71 | Temp 98.1°F | Ht 71.0 in | Wt 188.1 lb

## 2022-05-12 DIAGNOSIS — E1169 Type 2 diabetes mellitus with other specified complication: Secondary | ICD-10-CM

## 2022-05-12 DIAGNOSIS — E785 Hyperlipidemia, unspecified: Secondary | ICD-10-CM | POA: Diagnosis not present

## 2022-05-12 DIAGNOSIS — Z09 Encounter for follow-up examination after completed treatment for conditions other than malignant neoplasm: Secondary | ICD-10-CM | POA: Diagnosis not present

## 2022-05-12 DIAGNOSIS — E1165 Type 2 diabetes mellitus with hyperglycemia: Secondary | ICD-10-CM | POA: Diagnosis not present

## 2022-05-12 LAB — GLUCOSE, POCT (MANUAL RESULT ENTRY): POC Glucose: 251 mg/dl — AB (ref 70–99)

## 2022-05-12 MED ORDER — GVOKE HYPOPEN 2-PACK 0.5 MG/0.1ML ~~LOC~~ SOAJ: 0.5000 mg | SUBCUTANEOUS | 5 refills | Status: AC | PRN

## 2022-05-12 MED ORDER — GLUCOSE BLOOD VI STRP: ORAL_STRIP | 12 refills | Status: DC

## 2022-05-12 MED ORDER — METFORMIN HCL 500 MG PO TABS: 500.0000 mg | ORAL_TABLET | Freq: Two times a day (BID) | ORAL | 3 refills | Status: DC

## 2022-05-12 MED ORDER — ONETOUCH ULTRASOFT LANCETS MISC: 12 refills | Status: AC

## 2022-05-12 MED ORDER — EMPAGLIFLOZIN 10 MG PO TABS: 10.0000 mg | ORAL_TABLET | Freq: Every day | ORAL | 1 refills | Status: DC

## 2022-05-12 NOTE — Assessment & Plan Note (Signed)
Diet and nutrition discussed.  Continue simvastatin 20 mg daily. Continue Humalog 75/25 15 units twice a day We will start metformin 500 mg twice a day along with Jardiance 10 mg daily.

## 2022-05-12 NOTE — Patient Instructions (Signed)

## 2022-05-12 NOTE — Progress Notes (Signed)
Douglas [name] 87 y.o.   Chief Complaint  Patient presents with   Hospitalization Follow-up    Patient states his glucose has been fluctuating all over the place. The highest was 314 lowest 126s     HISTORY OF PRESENT ILLNESS: This is a 87 y.o. male here for follow-up of recent hospital admission when he was admitted with uncontrolled diabetes Accompanied by daughter.  Feels much better today. Blood glucose at home between 150 and 300. Discharge summary as follows: Physician Discharge Summary  [name] [id number] DOB: 1935/05/05 DOA: 05/07/2022   PCP: [name], MD   Admit date: 05/07/2022 Discharge date: 05/09/2022   Admitted From: Home Disposition:  Home   Discharge Condition:Stable CODE STATUS:FULL Diet recommendation:  Carb Modified   Brief/Interim Summary:   Patient is 87 year old male with history of colon polyp, prostate cancer, GERD, hyperlipidemia, type 2 diabetes not on meds who presented to the emergency department for the evaluation of abnormal glucose level.  Patient endorses symptoms of blurry vision, polyuria, polydipsia over 2 weeks.  On presentation UA showed glycosuria, ketonuria.  Blood work showed glucose of 419, normal anion gap, creatinine 1.2.  Patient was admitted for the management of uncontrolled diabetes type 2 with hyperglycemia.  Diabetic coordinator consulted.  He has been provided  insulin education ,monitoring blood sugars and has been started on insulin.  Medically stable for discharge home today.   Following problems were addressed during the hospitalization:   Uncontrolled diabetes type 2 with hyperglycemia: Presented with complaints of blurry vision, polyuria, polydipsia for 2 weeks.  Also report of syncopal episode x 2 at home a week ago.  Apparently not taking any medications at home.  Hemoglobin A1c 14.5.  Diabetic coordinator consulted.  Started on insulin drip initially then  changed to sliding scale and long-acting insulin.  He has been prescribed insulin on discharge.  Educated on monitoring blood sugars at home, insulin use.   History of prostate cancer/bladder outlet obstruction: On alfuzosin.  We recommend follow-up with urology as an outpatient   Hyperlipidemia: On simvastatin   History of glaucoma: Continue home eyedrops.  Follow-up with ophthalmology tomorrow at [facility]   Hypokalemia: supplemented with  potassium   Syncope: Likely from dehydration from hyperglycemia causing diuresis.  PT consulted, recommended home health   Discharge Diagnoses:  Principal Problem:   Uncontrolled type 2 diabetes mellitus with hyperglycemia ([place]) Active Problems:   Prostate cancer ([place])   Dyslipidemia   Bladder outlet obstruction   Atherosclerosis of aorta (H[facility])   Glaucoma   Hyperglycemia    HPI   Prior to Admission medications   Medication Sig Start Date End Date Taking? Authorizing Provider  acetaminophen (TYLENOL) 500 MG tablet Take 500-1,000 mg by mouth every 6 (six) hours as needed for mild pain or headache.    [provider]  albuterol (VENTOLIN HFA) 108 (90 Base) MCG/ACT inhaler TAKE 2 PUFFS BY MOUTH EVERY 6 HOURS AS NEEDED FOR WHEEZE OR SHORTNESS OF BREATH Patient taking differently: Inhale 2 puffs into the lungs every 6 (six) hours as needed for wheezing or shortness of breath. 06/04/21   Maximiano Coss, NP  alfuzosin (UROXATRAL) 10 MG 24 hr tablet Take 10 mg by mouth at bedtime.    [provider]  aspirin EC 81 MG tablet Take 81 mg by mouth at bedtime. Swallow whole.    [provider]  blood glucose meter kit and supplies 1 each by Other route 3 (three) times daily. 05/09/22  [name], MD  brimonidine (ALPHAGAN) 0.2 % ophthalmic solution Place 1 drop into the left eye 2 (two) times daily.    [provider]  cetirizine (ZYRTEC) 10 MG tablet TAKE 1 TABLET BY MOUTH EVERY DAY Patient taking differently: Take 10 mg by mouth at bedtime. 09/09/21   [name], MD  Cholecalciferol (VITAMIN D3) 50 MCG (2000 UT) TABS Take 2,000 Units by mouth at bedtime.    [provider]  dorzolamide-timolol (COSOPT) 2-0.5 % ophthalmic solution Place 1 drop into both eyes 2 (two) times daily.    [provider]  hydrochlorothiazide (MICROZIDE) 12.5 MG capsule Take 12.5 mg by mouth at bedtime.    [provider]  Insulin Lispro Prot & Lispro (HUMALOG 75/25 MIX) (75-25) 100 UNIT/ML Kwikpen Inject 15 Units into the skin 2 (two) times daily. 05/09/22   [name], MD  Insulin Pen Needle (PEN NEEDLES 3/16") 31G X 5 MM MISC 1 each by Does not apply route 2 (two) times daily. 05/09/22   [name], MD  latanoprost (XALATAN) 0.005 % ophthalmic solution Place 1 drop into both eyes at bedtime. 07/14/19   [provider]  Multiple Vitamin (MULTIVITAMIN) tablet Take 1 tablet by mouth at bedtime.    [provider]  pantoprazole (PROTONIX) 40 MG tablet TAKE 1 TABLET BY MOUTH EVERY DAY Patient taking differently: Take 40 mg by mouth at bedtime. 03/02/22   [name], MD  RESTASIS 0.05 % ophthalmic emulsion Place 1 drop into both eyes 2 (two) times daily. 07/13/17   [provider]  RHOPRESSA 0.02 % SOLN Place 1 drop into both eyes at bedtime.    [provider]  simvastatin (ZOCOR) 20 MG tablet TAKE 1 TABLET DAILY AT 6PM. Patient taking differently: Take 20 mg by mouth at bedtime. 01/03/22   [name], MD    No Known Allergies  Patient Active Problem List   Diagnosis Date Noted   Dyslipidemia associated with type 2 diabetes mellitus ([place]) 05/12/2022   Hyperglycemia 05/08/2022   Uncontrolled type 2 diabetes mellitus with hyperglycemia ([place]) 05/07/2022   Glaucoma 05/07/2022   Tiredness 05/06/2022   Weight loss 05/06/2022   Neuropathic pain of both feet 11/05/2021   Decreased hearing of left ear 11/05/2021   Simple chronic bronchitis ([place]) 06/18/2021   Diet-controlled diabetes mellitus  ([place]) 09/20/2020   History of prostate cancer 09/20/2020   Atherosclerosis of aorta ([place]) 09/20/2020   B12 deficiency 12/16/2016   Bladder outlet obstruction 04/25/2015   Erectile dysfunction due to arterial insufficiency 04/25/2015   Osteoporosis 04/25/2015   Vitamin D deficiency 04/25/2015   Prostate cancer ([place]) 05/30/2011   Dyslipidemia 05/30/2011   Hypogonadism male 05/30/2011    Past Medical History:  Diagnosis Date   Adenomatous colon polyp 03/2001   Allergy    SEASONAL   Cancer ([place])    Cataract    Dizziness    GERD (gastroesophageal reflux disease)    Hearing loss    Hyperlipidemia    Low testosterone     Past Surgical History:  Procedure Laterality Date   APPENDECTOMY     COLONOSCOPY     EYE SURGERY     POLYPECTOMY     PROSTATE SURGERY     SPINE SURGERY      Social History   Socioeconomic History   Marital status: Married    Spouse name: Not on file   Number of children: 3   Years of education: 2 years [employer]  Highest education level: Not on file  Occupational History   Occupation: RETIRED  Tobacco Use   Smoking status: Never   Smokeless tobacco: Never  Vaping Use   Vaping Use: Never used  Substance and Sexual Activity   Alcohol use: No    Alcohol/week: 0.0 standard drinks of alcohol   Drug use: No   Sexual activity: Yes  Other Topics Concern   Not on file  Social History Narrative   Married. Education: [employer]. Exercise: Yes.   Right-handed.   Drinks coffee daily.   Lives at home with his wife.   Social Determinants of Health   Financial Resource Strain: Low Risk  (07/12/2021)   Overall Financial Resource Strain (CARDIA)    Difficulty of Paying Living Expenses: Not hard at all  Food Insecurity: No Food Insecurity (05/07/2022)   Hunger Vital Sign    Worried About Running Out of Food in the Last Year: Never true    Ran Out of Food in the Last Year: Never true  Transportation Needs: No Transportation Needs (05/07/2022)   PRAPARE -  Hydrologist (Medical): No    Lack of Transportation (Non-Medical): No  Physical Activity: Sufficiently Active (07/12/2021)   Exercise Vital Sign    Days of Exercise per Week: 3 days    Minutes of Exercise per Session: 50 min  Stress: No Stress Concern Present (07/12/2021)   [place]    Feeling of Stress : Not at all  Social Connections: [place] (07/12/2021)   Social Connection and Isolation Panel [NHANES]    Frequency of Communication with Friends and Family: Twice a week    Frequency of Social Gatherings with Friends and Family: Twice a week    Attends Religious Services: 1 to 4 times per year    Active Member of [employer] or Organizations: Yes    Attends Archivist Meetings: 1 to 4 times per year    Marital Status: Married  Human resources officer Violence: Not At Risk (05/07/2022)   Humiliation, Afraid, Rape, and Kick questionnaire    Fear of Current or Ex-Partner: No    Emotionally Abused: No    Physically Abused: No    Sexually Abused: No    Family History  Problem Relation Age of Onset   Cancer Father 81       pancreatic   Other Mother        age 26   Prostate cancer Brother    Colon cancer Neg Hx    Esophageal cancer Neg Hx    Liver cancer Neg Hx    Pancreatic cancer Neg Hx    Rectal cancer Neg Hx    Stomach cancer Neg Hx      Review of Systems  Constitutional: Negative.   [name]: Negative.  Negative for congestion and sore throat.   Respiratory: Negative.  Negative for cough and shortness of breath.   Cardiovascular: Negative.  Negative for chest pain and palpitations.  Gastrointestinal:  Negative for abdominal pain, nausea and vomiting.  Genitourinary: Negative.  Negative for dysuria and hematuria.  Neurological: Negative.  Negative for dizziness and headaches.  All other systems reviewed and are negative.  Today's Vitals   05/12/22 1304  BP: 130/68   Pulse: 71  Temp: 98.1 F (36.7 C)  TempSrc: Oral  SpO2: 97%  Weight: 188 lb 2 oz (85.3 kg)  Height: 5' 11"$  (1.803 m)   Body mass index is 26.24 kg/m.  Wt Readings from Last 3 Encounters:  05/12/22 188 lb 2 oz (85.3 kg)  05/08/22 185 lb 6 oz (84.1 kg)  05/06/22 185 lb 6 oz (84.1 kg)     Physical Exam Constitutional:      Appearance: Normal appearance.  [name]:     Head: Normocephalic.  Eyes:     Extraocular Movements: Extraocular movements intact.  Cardiovascular:     Rate and Rhythm: Normal rate.  Pulmonary:     Effort: Pulmonary effort is normal.  Skin:    General: Skin is warm and dry.  Neurological:     Mental Status: He is alert and oriented to person, place, and time.  Psychiatric:        Mood and Affect: Mood normal.        Behavior: Behavior normal.    Results for orders placed or performed in visit on 05/12/22 (from the past 24 hour(s))  POCT Glucose (CBG)     Status: Abnormal   Collection Time: 05/12/22  2:06 PM  Result Value Ref Range   POC Glucose 251 (A) 70 - 99 mg/dl     ASSESSMENT & PLAN: A total of 49 minutes was spent with the patient and counseling/coordination of care regarding preparing for this visit, review of most recent office visit notes, review of discharge summary from recent hospital stay, review of all medications and changes made, diagnosis of uncontrolled diabetes and cardiovascular risk associated with this condition, education on nutrition, review of most recent blood work results including today's glucose level, need for endocrinology evaluation and referral, referral to diabetic nutritional services, prognosis, documentation and need to follow-up in 4 weeks.  Problem List Items Addressed This Visit       Endocrine   Uncontrolled type 2 diabetes mellitus with hyperglycemia ([place]) - Primary    Just started on Humalog 75/25 15 units twice a day Continue same dose. Recommend to start Jardiance 10 mg daily and metformin 500 mg twice a  day. Patient has normal renal function tests. Hypoglycemia precautions given. Prescription for Gvoke sent.  Instructions on how to use it given. Diet and nutrition discussed. Cardiovascular risks associated with uncontrolled diabetes discussed.      Relevant Medications   empagliflozin (JARDIANCE) 10 MG TABS tablet   metFORMIN (GLUCOPHAGE) 500 MG tablet   Glucagon (GVOKE HYPOPEN 2-PACK) 0.5 MG/0.1ML SOAJ   Other Relevant Orders   Ambulatory referral to diabetic education   Ambulatory referral to Endocrinology   POCT Glucose (CBG) (Completed)   AMB Referral to Chronic Care Management Services   Dyslipidemia associated with type 2 diabetes mellitus ([place])    Diet and nutrition discussed.  Continue simvastatin 20 mg daily. Continue Humalog 75/25 15 units twice a day We will start metformin 500 mg twice a day along with Jardiance 10 mg daily.       Relevant Medications   empagliflozin (JARDIANCE) 10 MG TABS tablet   metFORMIN (GLUCOPHAGE) 500 MG tablet   Glucagon (GVOKE HYPOPEN 2-PACK) 0.5 MG/0.1ML SOAJ   Other Relevant Orders   Ambulatory referral to diabetic education   Ambulatory referral to Endocrinology   AMB Referral to Chronic Care Management Services   Other Visit Diagnoses     Hospital discharge follow-up          Patient Instructions  Diabetes Mellitus and Nutrition, Adult When you have diabetes, or diabetes mellitus, it is very important to have healthy eating habits because your blood sugar (glucose) levels are greatly affected by what you eat and  drink. Eating healthy foods in the right amounts, at about the same times every day, can help you: Manage your blood glucose. Lower your risk of heart disease. Improve your blood pressure. Reach or maintain a healthy weight. What can affect my meal plan? Every person with diabetes is different, and each person has different needs for a meal plan. Your health care provider may recommend that you work with a dietitian to  make a meal plan that is best for you. Your meal plan may vary depending on factors such as: The calories you need. The medicines you take. Your weight. Your blood glucose, blood pressure, and cholesterol levels. Your activity level. Other health conditions you have, such as heart or kidney disease. How do carbohydrates affect me? Carbohydrates, also called carbs, affect your blood glucose level more than any other type of food. Eating carbs raises the amount of glucose in your blood. It is important to know how many carbs you can safely have in each meal. This is different for every person. Your dietitian can help you calculate how many carbs you should have at each meal and for each snack. How does alcohol affect me? Alcohol can cause a decrease in blood glucose (hypoglycemia), especially if you use insulin or take certain diabetes medicines by mouth. Hypoglycemia can be a life-threatening condition. Symptoms of hypoglycemia, such as sleepiness, dizziness, and confusion, are similar to symptoms of having too much alcohol. Do not drink alcohol if: Your health care provider tells you not to drink. You are pregnant, may be pregnant, or are planning to become pregnant. If you drink alcohol: Limit how much you have to: 0-1 drink a day for women. 0-2 drinks a day for men. Know how much alcohol is in your drink. In the [place] one drink equals one 12 oz bottle of beer (355 mL), one 5 oz glass of wine (148 mL), or one 1 oz glass of hard liquor (44 mL). Keep yourself hydrated with water, diet soda, or unsweetened iced tea. Keep in mind that regular soda, juice, and other mixers may contain a lot of sugar and must be counted as carbs. What are tips for following this plan?  Reading food labels Start by checking the serving size on the Nutrition Facts label of packaged foods and drinks. The number of calories and the amount of carbs, fats, and other nutrients listed on the label are based on one  serving of the item. Many items contain more than one serving per package. Check the total grams (g) of carbs in one serving. Check the number of grams of saturated fats and trans fats in one serving. Choose foods that have a low amount or none of these fats. Check the number of milligrams (mg) of salt (sodium) in one serving. Most people should limit total sodium intake to less than 2,300 mg per day. Always check the nutrition information of foods labeled as "low-fat" or "nonfat." These foods may be higher in added sugar or refined carbs and should be avoided. Talk to your dietitian to identify your daily goals for nutrients listed on the label. Shopping Avoid buying canned, pre-made, or processed foods. These foods tend to be high in fat, sodium, and added sugar. Shop around the outside edge of the grocery store. This is where you will most often find fresh fruits and vegetables, bulk grains, fresh meats, and fresh dairy products. Cooking Use low-heat cooking methods, such as baking, instead of high-heat cooking methods, such as deep frying. Lacinda Axon  using healthy oils, such as olive, canola, or sunflower oil. Avoid cooking with butter, cream, or high-fat meats. Meal planning Eat meals and snacks regularly, preferably at the same times every day. Avoid going long periods of time without eating. Eat foods that are high in fiber, such as fresh fruits, vegetables, beans, and whole grains. Eat 4-6 oz (112-168 g) of lean protein each day, such as lean meat, chicken, fish, eggs, or tofu. One ounce (oz) (28 g) of lean protein is equal to: 1 oz (28 g) of meat, chicken, or fish. 1 egg.  cup (62 g) of tofu. Eat some foods each day that contain healthy fats, such as avocado, nuts, seeds, and fish. What foods should I eat? Fruits Berries. Apples. Oranges. Peaches. Apricots. Plums. Grapes. Mangoes. Papayas. Pomegranates. Kiwi. Cherries. Vegetables Leafy greens, including lettuce, spinach, kale, chard,  collard greens, mustard greens, and cabbage. Beets. Cauliflower. Broccoli. Carrots. Green beans. Tomatoes. Peppers. Onions. Cucumbers. Brussels sprouts. Grains Whole grains, such as whole-wheat or whole-grain bread, crackers, tortillas, cereal, and pasta. Unsweetened oatmeal. Quinoa. Brown or wild rice. Meats and other proteins Seafood. Poultry without skin. Lean cuts of poultry and beef. Tofu. Nuts. Seeds. Dairy Low-fat or fat-free dairy products such as milk, yogurt, and cheese. The items listed above may not be a complete list of foods and beverages you can eat and drink. Contact a dietitian for more information. What foods should I avoid? Fruits Fruits canned with syrup. Vegetables Canned vegetables. Frozen vegetables with butter or cream sauce. Grains Refined white flour and flour products such as bread, pasta, snack foods, and cereals. Avoid all processed foods. Meats and other proteins Fatty cuts of meat. Poultry with skin. Breaded or fried meats. Processed meat. Avoid saturated fats. Dairy Full-fat yogurt, cheese, or milk. Beverages Sweetened drinks, such as soda or iced tea. The items listed above may not be a complete list of foods and beverages you should avoid. Contact a dietitian for more information. Questions to ask a health care provider Do I need to meet with a certified diabetes care and education specialist? Do I need to meet with a dietitian? What number can I call if I have questions? When are the best times to check my blood glucose? Where to find more information: American Diabetes Association: diabetes.org Academy of Nutrition and Dietetics: eatright.[employer] of Diabetes and Digestive and Kidney Diseases: [employer]is Association of Diabetes Care & Education Specialists: diabeteseducator.org Summary It is important to have healthy eating habits because your blood sugar (glucose) levels are greatly affected by what you eat and drink. It is  important to use alcohol carefully. A healthy meal plan will help you manage your blood glucose and lower your risk of heart disease. Your health care provider may recommend that you work with a dietitian to make a meal plan that is best for you. This information is not intended to replace advice given to you by your health care provider. Make sure you discuss any questions you have with your health care provider. Document Revised: 10/19/2019 Document Reviewed: 10/19/2019 Elsevier Patient Education  [place], MD [facility]

## 2022-05-12 NOTE — Transitions of Care (Post Inpatient/ED Visit) (Signed)
   05/12/2022  Name: Douglas [name] MRN: [id number] DOB: [birth year]  Today's TOC FU Call Status: Today's TOC FU Call Status:: Successful TOC FU Call Competed TOC FU Call Complete Date: 05/12/22  Transition Care Management Follow-up Telephone Call Date of Discharge: 05/09/22 Discharge Facility: [facility] Type of Discharge: Inpatient Admission Primary Inpatient Discharge Diagnosis:: Uncontrolled type 2 diabetes mellitus with hyperglycemia How have you been since you were released from the hospital?: Better Any questions or concerns?: No  Items Reviewed: Did you receive and understand the discharge instructions provided?: Yes Medications obtained and verified?: Yes (Medications Reviewed) Any new allergies since your discharge?: No Dietary orders reviewed?: Yes Do you have support at home?: Yes  Home Care and Equipment/Supplies: [name] Ordered?: Yes Name of [name]:: unsure of name Has Agency set up a time to come to your home?: No Any new equipment or medical supplies ordered?: NA  Functional Questionnaire: Do you need assistance with bathing/showering or dressing?: Yes Do you need assistance with meal preparation?: Yes Do you need assistance with eating?: Yes Do you have difficulty maintaining continence: Yes Do you need assistance with getting out of bed/getting out of a chair/moving?: Yes Do you have difficulty managing or taking your medications?: Yes  Folllow up appointments reviewed: PCP Follow-up appointment confirmed?: Yes Date of PCP follow-up appointment?: 05/12/22 Follow-up Provider: [facility] Follow-up appointment confirmed?: NA Do you need transportation to your follow-up appointment?: No Do you understand care options if your condition(s) worsen?: Yes-patient verbalized understanding    [place] LPN [place] [phone]

## 2022-05-12 NOTE — Assessment & Plan Note (Signed)
Just started on Humalog 75/25 15 units twice a day Continue same dose. Recommend to start Jardiance 10 mg daily and metformin 500 mg twice a day. Patient has normal renal function tests. Hypoglycemia precautions given. Prescription for Gvoke sent.  Instructions on how to use it given. Diet and nutrition discussed. Cardiovascular risks associated with uncontrolled diabetes discussed.

## 2022-05-14 ENCOUNTER — Telehealth: Payer: Self-pay | Admitting: Emergency Medicine

## 2022-05-14 DIAGNOSIS — E538 Deficiency of other specified B group vitamins: Secondary | ICD-10-CM | POA: Diagnosis not present

## 2022-05-14 DIAGNOSIS — N320 Bladder-neck obstruction: Secondary | ICD-10-CM | POA: Diagnosis not present

## 2022-05-14 DIAGNOSIS — I700 Atherosclerosis of aorta: Secondary | ICD-10-CM | POA: Diagnosis not present

## 2022-05-14 DIAGNOSIS — Z9181 History of falling: Secondary | ICD-10-CM | POA: Diagnosis not present

## 2022-05-14 DIAGNOSIS — H538 Other visual disturbances: Secondary | ICD-10-CM | POA: Diagnosis not present

## 2022-05-14 DIAGNOSIS — H409 Unspecified glaucoma: Secondary | ICD-10-CM | POA: Diagnosis not present

## 2022-05-14 DIAGNOSIS — Z7982 Long term (current) use of aspirin: Secondary | ICD-10-CM | POA: Diagnosis not present

## 2022-05-14 DIAGNOSIS — Z8546 Personal history of malignant neoplasm of prostate: Secondary | ICD-10-CM | POA: Diagnosis not present

## 2022-05-14 DIAGNOSIS — M810 Age-related osteoporosis without current pathological fracture: Secondary | ICD-10-CM | POA: Diagnosis not present

## 2022-05-14 DIAGNOSIS — E1142 Type 2 diabetes mellitus with diabetic polyneuropathy: Secondary | ICD-10-CM | POA: Diagnosis not present

## 2022-05-14 DIAGNOSIS — E1165 Type 2 diabetes mellitus with hyperglycemia: Secondary | ICD-10-CM | POA: Diagnosis not present

## 2022-05-14 DIAGNOSIS — E785 Hyperlipidemia, unspecified: Secondary | ICD-10-CM | POA: Diagnosis not present

## 2022-05-14 DIAGNOSIS — H9190 Unspecified hearing loss, unspecified ear: Secondary | ICD-10-CM | POA: Diagnosis not present

## 2022-05-14 DIAGNOSIS — K219 Gastro-esophageal reflux disease without esophagitis: Secondary | ICD-10-CM | POA: Diagnosis not present

## 2022-05-14 DIAGNOSIS — Z7984 Long term (current) use of oral hypoglycemic drugs: Secondary | ICD-10-CM | POA: Diagnosis not present

## 2022-05-14 NOTE — Telephone Encounter (Signed)
Belenda Cruise from [facility] called requesting verbal orders. Belenda Cruise stated that the patient does not need physical therapy but they are requesting verbal orders for the patient to have a nurse evaluation. Best callback number for Belenda Cruise at [facility] is [phone].

## 2022-05-15 NOTE — Telephone Encounter (Signed)
Okay with requested orders.  Thanks.

## 2022-05-15 NOTE — Telephone Encounter (Signed)
Called Katherine at [facility] and left message on secure VM to ok verbal order for patient

## 2022-05-19 DIAGNOSIS — I700 Atherosclerosis of aorta: Secondary | ICD-10-CM | POA: Diagnosis not present

## 2022-05-19 DIAGNOSIS — E1165 Type 2 diabetes mellitus with hyperglycemia: Secondary | ICD-10-CM | POA: Diagnosis not present

## 2022-05-19 DIAGNOSIS — H409 Unspecified glaucoma: Secondary | ICD-10-CM | POA: Diagnosis not present

## 2022-05-19 DIAGNOSIS — N320 Bladder-neck obstruction: Secondary | ICD-10-CM | POA: Diagnosis not present

## 2022-05-19 DIAGNOSIS — E1142 Type 2 diabetes mellitus with diabetic polyneuropathy: Secondary | ICD-10-CM | POA: Diagnosis not present

## 2022-05-19 DIAGNOSIS — E785 Hyperlipidemia, unspecified: Secondary | ICD-10-CM | POA: Diagnosis not present

## 2022-05-27 ENCOUNTER — Ambulatory Visit: Payer: Medicare Other | Admitting: Nutrition

## 2022-05-27 ENCOUNTER — Encounter: Payer: Medicare Other | Attending: Emergency Medicine | Admitting: Nutrition

## 2022-05-27 DIAGNOSIS — E119 Type 2 diabetes mellitus without complications: Secondary | ICD-10-CM | POA: Insufficient documentation

## 2022-05-28 ENCOUNTER — Telehealth: Payer: Self-pay

## 2022-05-28 NOTE — Progress Notes (Signed)
Patient is here with his wife and daughter to learn about how he can take care of his diabetes with diet and medication. History:  entered hospital 2 weeks ago with blood sugar of over 500.  This is a new diagnosis for him. Medication:  Patient reports taking metformin, Humalog: 25/75 bid, 15u, 15 minutes acB and acS SBGM:  finger sticks ac and HS.  He did not bring record of readings, but reports FBS today is 90, and most others readings before meals are less than 120 in last week.   Exercise:  45 minutes daily on treadmill with 15 minutes of additional muscle toning. Diet:  Typical day, since leaving the hospital 2 weeks ago: 8AM: awake 9AM: up and takes his insulin 9:15AM: Bfast 1 cup of oatmeal with raisins and coffee with splenda 11AM:  treadmil for 45 minutes and weight training like push ups and sit ups for 10-15 minutes 7 days /wk. 1PM: lunch [place] sandwich on whole wheat bread and apple or banana, or handful of grapes, diet drink, or hamburger with 1 fruit serving 4PM: snack of handful of grapes. 5PM: Supper:  always protein of some sort: chicken breast-3ounces, without the skin, 3 ounce hamburger patty with 2 non starchy veg,no bread, and rare starchy veg. (Usually 1-2 times/week) diet drink or water 9PM: package of 6 nabs with water 9:30 bed Says is afraid of eating anything more or different because the blood sugars are all good Discussion:  Insulin injections:  He reports no difficulty using the insulin pens, and we reviewed his technique.  Discussed need to rotate sites, and which sites are appropriate for use.  Discussed timing of this insulin, in reference to his meals, and how the metformin works to control his blood sugars.  They reported good understanding of this. Low blood sugars:  He denies this has happened as yet.  Discussed symptoms and treatment for this.  Also reviewed how ad when to use the  glucagon injection that was prescribed for this if/when he ever needs this.   Also reviewed need to call MD for insulin dose adjustments if this happens without cause. Diet:  His diet appears well balanced, but low in carbs and protein.  Discussed the 3 basic food groups and portion sizes of each for meals and snacks.  Handouts given for meal plan of 2000 calories with appropriate foods at each meal.  Also handouts given for need to balance meals and 15 gram portions sizes for carbohydrates and need to have 3 servings at each meal, label reading and appropriate snack options.  Reviewed all of the handouts and patient and family reported good understanding of these concepts. Reviewed type 2 diabetes and the idea of insulin resistance and and the fact that he has successfully decreased this.    Discussed idea of possibility of needing to reduce his insulin doses due to increased insulin sensitivity, and symptoms of low blood sugar and need to call his MD for possible reduction in insulin dose.  They reported good understanding that if his blood sugars drop below 80, he will call his MD for a medication reduction     Believe overall understanding is good and motivation is high to maintain good blood sugar control.  Family had no final questions for me at this time, but telephone number given if questions remain after leaving here.

## 2022-05-28 NOTE — Progress Notes (Signed)
  Chronic Care Management   Note  05/28/2022 Name: Douglas [name] MRN: [id number] DOB: 09-Sep-1935  Douglas [name] is a 87 y.o. year old male who is a primary care patient of Mitchel Honour, Ines Bloomer, MD. I reached out to Douglas [name] by phone today in response to a referral sent by Douglas [name]'s PCP.  Douglas [name] was given information about Chronic Care Management services today including:  CCM service includes personalized support from designated clinical staff supervised by the physician, including individualized plan of care and coordination with other care providers 24/7 contact phone numbers for assistance for urgent and routine care needs. Service will only be billed when office clinical staff spend 20 minutes or more in a month to coordinate care. Only one practitioner may furnish and bill the service in a calendar month. The patient may stop CCM services at amy time (effective at the end of the month) by phone call to the office staff. The patient will be responsible for cost sharing (co-pay) or up to 20% of the service fee (after annual deductible is met)  Douglas [name]  agreedto scheduling an appointment with the CCM RN Case Manager   Follow up plan: Patient agreed to scheduled appointment with RN Case Manager on 06/17/2022 and Pharm d 06/24/2022(date/time).   Noreene Larsson, [place], [place] [address] Direct Dial: [phone] [email or web address]

## 2022-05-28 NOTE — Patient Instructions (Addendum)
Review handouts given and call if questions. Continue to take insulin as directed Call MD office if blood sugars drop below 80 Add protein to breakfast meal and 1-2 servings of carbohydrate to supper meal, as well as 1 more ounce of protein at lunch and supper

## 2022-06-03 ENCOUNTER — Ambulatory Visit (INDEPENDENT_AMBULATORY_CARE_PROVIDER_SITE_OTHER): Payer: Medicare Other | Admitting: Emergency Medicine

## 2022-06-03 ENCOUNTER — Encounter: Payer: Self-pay | Admitting: Emergency Medicine

## 2022-06-03 VITALS — BP 116/80 | HR 73 | Temp 98.4°F | Ht 71.0 in | Wt 190.4 lb

## 2022-06-03 DIAGNOSIS — E785 Hyperlipidemia, unspecified: Secondary | ICD-10-CM

## 2022-06-03 DIAGNOSIS — J410 Simple chronic bronchitis: Secondary | ICD-10-CM

## 2022-06-03 DIAGNOSIS — I951 Orthostatic hypotension: Secondary | ICD-10-CM | POA: Diagnosis not present

## 2022-06-03 DIAGNOSIS — E1169 Type 2 diabetes mellitus with other specified complication: Secondary | ICD-10-CM

## 2022-06-03 DIAGNOSIS — N320 Bladder-neck obstruction: Secondary | ICD-10-CM | POA: Diagnosis not present

## 2022-06-03 NOTE — Addendum Note (Signed)
Addended by: Davina Poke on: 06/03/2022 12:18 PM   Modules accepted: Level of Service

## 2022-06-03 NOTE — Progress Notes (Signed)
Douglas [name] 87 y.o.   Chief Complaint  Patient presents with   Follow-up    4 wk f/u appt, patient wants to talk about medication interactions with an eye drop and his inhaler.   Patient states that he is dizzy when he stands    HISTORY OF PRESENT ILLNESS: This is a 87 y.o. male accompanied by daughter, here for 4-week follow-up of diabetes and chronic medical conditions Concerned about possible interaction of glaucoma eyedrops and Breztri inhaler Also complaining of occasional dizziness when he stands No other complaints or medical concerns today.  HPI   Prior to Admission medications   Medication Sig Start Date End Date Taking? Authorizing Provider  acetaminophen (TYLENOL) 500 MG tablet Take 500-1,000 mg by mouth every 6 (six) hours as needed for mild pain or headache.   Yes [provider]  albuterol (VENTOLIN HFA) 108 (90 Base) MCG/ACT inhaler TAKE 2 PUFFS BY MOUTH EVERY 6 HOURS AS NEEDED FOR WHEEZE OR SHORTNESS OF BREATH Patient taking differently: Inhale 2 puffs into the lungs every 6 (six) hours as needed for wheezing or shortness of breath. 06/04/21  Yes Maximiano Coss, NP  alfuzosin (UROXATRAL) 10 MG 24 hr tablet Take 10 mg by mouth at bedtime.   Yes [provider]  aspirin EC 81 MG tablet Take 81 mg by mouth at bedtime. Swallow whole.   Yes [provider]  blood glucose meter kit and supplies 1 each by Other route 3 (three) times daily. 05/09/22  Yes [name], MD  brimonidine (ALPHAGAN) 0.2 % ophthalmic solution Place 1 drop into the left eye 2 (two) times daily.   Yes [provider]  cetirizine (ZYRTEC) 10 MG tablet TAKE 1 TABLET BY MOUTH EVERY DAY Patient taking differently: Take 10 mg by mouth at bedtime. 09/09/21  Yes Courney Garrod, Ines Bloomer, MD  Cholecalciferol (VITAMIN D3) 50 MCG (2000 UT) TABS Take 2,000 Units by mouth at bedtime.   Yes [provider]  dorzolamide-timolol (COSOPT) 2-0.5 % ophthalmic solution Place  1 drop into both eyes 2 (two) times daily.   Yes [provider]  empagliflozin (JARDIANCE) 10 MG TABS tablet Take 1 tablet (10 mg total) by mouth daily before breakfast. 05/12/22 11/08/22 Yes Brigido Mera, Ines Bloomer, MD  Glucagon (GVOKE HYPOPEN 2-PACK) 0.5 MG/0.1ML SOAJ Inject 0.5 mg into the skin as needed. 05/12/22  Yes [name], MD  glucose blood test strip Use as instructed to check his blood sugars 3 times a day 05/12/22  Yes Shatera Rennert, Ines Bloomer, MD  hydrochlorothiazide (MICROZIDE) 12.5 MG capsule Take 12.5 mg by mouth at bedtime.   Yes [provider]  Insulin Lispro Prot & Lispro (HUMALOG 75/25 MIX) (75-25) 100 UNIT/ML Kwikpen Inject 15 Units into the skin 2 (two) times daily. 05/09/22  Yes [name], MD  Insulin Pen Needle (PEN NEEDLES 3/16") 31G X 5 MM MISC 1 each by Does not apply route 2 (two) times daily. 05/09/22  Yes [name], MD  Lancets [facility] ULTRASOFT) lancets Use as instructed to check his blood sugars 3 times a day 05/12/22  Yes Rheda Kassab, Ines Bloomer, MD  latanoprost (XALATAN) 0.005 % ophthalmic solution Place 1 drop into both eyes at bedtime. 07/14/19  Yes [provider]  metFORMIN (GLUCOPHAGE) 500 MG tablet Take 1 tablet (500 mg total) by mouth 2 (two) times daily with a meal. 05/12/22  Yes Jovita Persing, Ines Bloomer, MD  Multiple Vitamin (MULTIVITAMIN) tablet Take 1 tablet by mouth at bedtime.   Yes [provider]  pantoprazole (PROTONIX) 40 MG tablet TAKE 1 TABLET BY MOUTH EVERY DAY Patient taking differently: Take 40 mg by mouth at bedtime. 03/02/22  Yes Maryuri Warnke, Ines Bloomer, MD  RESTASIS 0.05 % ophthalmic emulsion Place 1 drop into both eyes 2 (two) times daily. 07/13/17  Yes [provider]  RHOPRESSA 0.02 % SOLN Place 1 drop into both eyes at bedtime.   Yes [provider]  simvastatin (ZOCOR) 20 MG tablet TAKE 1 TABLET DAILY AT 6PM. Patient taking differently: Take 20 mg by mouth at bedtime. 01/03/22  Yes  [name], MD    No Known Allergies  Patient Active Problem List   Diagnosis Date Noted   Dyslipidemia associated with type 2 diabetes mellitus ([place]) 05/12/2022   Hyperglycemia 05/08/2022   Uncontrolled type 2 diabetes mellitus with hyperglycemia ([place]) 05/07/2022   Glaucoma 05/07/2022   Tiredness 05/06/2022   Weight loss 05/06/2022   Neuropathic pain of both feet 11/05/2021   Decreased hearing of left ear 11/05/2021   Simple chronic bronchitis ([place]) 06/18/2021   Diet-controlled diabetes mellitus ([place]) 09/20/2020   History of prostate cancer 09/20/2020   Atherosclerosis of aorta ([place]) 09/20/2020   B12 deficiency 12/16/2016   Bladder outlet obstruction 04/25/2015   Erectile dysfunction due to arterial insufficiency 04/25/2015   Osteoporosis 04/25/2015   Vitamin D deficiency 04/25/2015   Prostate cancer ([place]) 05/30/2011   Dyslipidemia 05/30/2011   Hypogonadism male 05/30/2011    Past Medical History:  Diagnosis Date   Adenomatous colon polyp 03/2001   Allergy    SEASONAL   Cancer ([place])    Cataract    Dizziness    GERD (gastroesophageal reflux disease)    Hearing loss    Hyperlipidemia    Low testosterone     Past Surgical History:  Procedure Laterality Date   APPENDECTOMY     COLONOSCOPY     EYE SURGERY     POLYPECTOMY     PROSTATE SURGERY     SPINE SURGERY      Social History   Socioeconomic History   Marital status: Married    Spouse name: Not on file   Number of children: 3   Years of education: 2 years [employer]   Highest education level: Not on file  Occupational History   Occupation: RETIRED  Tobacco Use   Smoking status: Never   Smokeless tobacco: Never  Vaping Use   Vaping Use: Never used  Substance and Sexual Activity   Alcohol use: No    Alcohol/week: 0.0 standard drinks of alcohol   Drug use: No   Sexual activity: Yes  Other Topics Concern   Not on file  Social History Narrative   Married. Education: [employer]. Exercise: Yes.    Right-handed.   Drinks coffee daily.   Lives at home with his wife.   Social Determinants of Health   Financial Resource Strain: Low Risk  (07/12/2021)   Overall Financial Resource Strain (CARDIA)    Difficulty of Paying Living Expenses: Not hard at all  Food Insecurity: No Food Insecurity (05/07/2022)   Hunger Vital Sign    Worried About Running Out of Food in the Last Year: Never true    Ran Out of Food in the Last Year: Never true  Transportation Needs: No Transportation Needs (05/07/2022)   PRAPARE - Hydrologist (Medical): No    Lack of Transportation (Non-Medical): No  Physical Activity: Sufficiently Active (07/12/2021)   Exercise Vital Sign  Days of Exercise per Week: 3 days    Minutes of Exercise per Session: 50 min  Stress: No Stress Concern Present (07/12/2021)   [place]    Feeling of Stress : Not at all  Social Connections: [place] (07/12/2021)   Social Connection and Isolation Panel [NHANES]    Frequency of Communication with Friends and Family: Twice a week    Frequency of Social Gatherings with Friends and Family: Twice a week    Attends Religious Services: 1 to 4 times per year    Active Member of [employer] or Organizations: Yes    Attends Archivist Meetings: 1 to 4 times per year    Marital Status: Married  Human resources officer Violence: Not At Risk (05/07/2022)   Humiliation, Afraid, Rape, and Kick questionnaire    Fear of Current or Ex-Partner: No    Emotionally Abused: No    Physically Abused: No    Sexually Abused: No    Family History  Problem Relation Age of Onset   Cancer Father 49       pancreatic   Other Mother        age 69   Prostate cancer Brother    Colon cancer Neg Hx    Esophageal cancer Neg Hx    Liver cancer Neg Hx    Pancreatic cancer Neg Hx    Rectal cancer Neg Hx    Stomach cancer Neg Hx      Review of Systems   Constitutional: Negative.  Negative for chills and fever.  [name]: Negative.  Negative for congestion and sore throat.   Respiratory: Negative.  Negative for cough and shortness of breath.   Cardiovascular: Negative.  Negative for chest pain and palpitations.  Gastrointestinal:  Negative for abdominal pain, diarrhea, nausea and vomiting.  Genitourinary: Negative.  Negative for dysuria and hematuria.  Skin: Negative.  Negative for rash.  Neurological: Negative.  Negative for dizziness and headaches.  All other systems reviewed and are negative.  Today's Vitals   06/03/22 0951  BP: 116/80  Pulse: 73  Temp: 98.4 F (36.9 C)  TempSrc: Oral  SpO2: 98%  Weight: 190 lb 6 oz (86.4 kg)  Height: '5\' 11"'$  (1.803 m)   Body mass index is 26.55 kg/m. Orthostatic VS for the past 24 hrs (Last 3 readings):  BP- Lying Pulse- Lying BP- Sitting Pulse- Sitting BP- Standing at 0 minutes Pulse- Standing at 0 minutes  06/03/22 1046 122/66 64 110/64 70 100/50 76     Physical Exam Vitals reviewed.  Constitutional:      Appearance: Normal appearance.  [name]:     Head: Normocephalic.  Eyes:     Extraocular Movements: Extraocular movements intact.     Pupils: Pupils are equal, round, and reactive to light.  Cardiovascular:     Rate and Rhythm: Normal rate and regular rhythm.     Pulses: Normal pulses.     Heart sounds: Normal heart sounds.  Pulmonary:     Effort: Pulmonary effort is normal.     Breath sounds: Normal breath sounds.  Abdominal:     Palpations: Abdomen is soft.     Tenderness: There is no abdominal tenderness.  Musculoskeletal:     Cervical back: No tenderness.  Lymphadenopathy:     Cervical: No cervical adenopathy.  Skin:    General: Skin is warm and dry.     Capillary Refill: Capillary refill takes less than 2 seconds.  Neurological:  General: No focal deficit present.     Mental Status: He is alert and oriented to person, place, and time.  Psychiatric:        Mood and  Affect: Mood normal.        Behavior: Behavior normal.      ASSESSMENT & PLAN: A total of 48 minutes was spent with the patient and counseling/coordination of care regarding preparing for this visit, review of most recent office visit notes, review of most recent blood work results, review of multiple chronic medical conditions and their management, review of blood glucose numbers at home, differential diagnosis of orthostasis, review of all medications and changes made, prognosis, documentation, education on nutrition, and need for follow-up.  Problem List Items Addressed This Visit       Cardiovascular and Mediastinum   Orthostasis    Medication induced. Needs to continue alfuzosin 10 mg at bedtime Recommend to stop hydrochlorothiazide 12.5 mg        Respiratory   Simple chronic bronchitis (HCC)    Stable.  Recommend to stop Breztri, although I do not see it on his medication list Use albuterol only as needed        Endocrine   Dyslipidemia associated with type 2 diabetes mellitus ([place]) - Primary    Well-controlled diabetes with normal blood glucose levels at home and no episodes of hypoglycemia. Continue Humalog 75/25 15 units twice daily Continue metformin 500 mg twice a day and Jardiance 10 mg daily Diet and nutrition discussed. Continue simvastatin 20 mg daily.        Genitourinary   Bladder outlet obstruction    Well-controlled symptoms with alfuzosin 10 mg at bedtime May be contributing to transient orthostatic symptoms      Patient Instructions  Stop hydrochlorothiazide and stop Breztri if still taking it Continue all other medications Follow-up in 6 months.  Diabetes Mellitus and Nutrition, Adult When you have diabetes, or diabetes mellitus, it is very important to have healthy eating habits because your blood sugar (glucose) levels are greatly affected by what you eat and drink. Eating healthy foods in the right amounts, at about the same times every day,  can help you: Manage your blood glucose. Lower your risk of heart disease. Improve your blood pressure. Reach or maintain a healthy weight. What can affect my meal plan? Every person with diabetes is different, and each person has different needs for a meal plan. Your health care provider may recommend that you work with a dietitian to make a meal plan that is best for you. Your meal plan may vary depending on factors such as: The calories you need. The medicines you take. Your weight. Your blood glucose, blood pressure, and cholesterol levels. Your activity level. Other health conditions you have, such as heart or kidney disease. How do carbohydrates affect me? Carbohydrates, also called carbs, affect your blood glucose level more than any other type of food. Eating carbs raises the amount of glucose in your blood. It is important to know how many carbs you can safely have in each meal. This is different for every person. Your dietitian can help you calculate how many carbs you should have at each meal and for each snack. How does alcohol affect me? Alcohol can cause a decrease in blood glucose (hypoglycemia), especially if you use insulin or take certain diabetes medicines by mouth. Hypoglycemia can be a life-threatening condition. Symptoms of hypoglycemia, such as sleepiness, dizziness, and confusion, are similar to symptoms of having too  much alcohol. Do not drink alcohol if: Your health care provider tells you not to drink. You are pregnant, may be pregnant, or are planning to become pregnant. If you drink alcohol: Limit how much you have to: 0-1 drink a day for women. 0-2 drinks a day for men. Know how much alcohol is in your drink. In the [place] one drink equals one 12 oz bottle of beer (355 mL), one 5 oz glass of wine (148 mL), or one 1 oz glass of hard liquor (44 mL). Keep yourself hydrated with water, diet soda, or unsweetened iced tea. Keep in mind that regular soda, juice, and  other mixers may contain a lot of sugar and must be counted as carbs. What are tips for following this plan?  Reading food labels Start by checking the serving size on the Nutrition Facts label of packaged foods and drinks. The number of calories and the amount of carbs, fats, and other nutrients listed on the label are based on one serving of the item. Many items contain more than one serving per package. Check the total grams (g) of carbs in one serving. Check the number of grams of saturated fats and trans fats in one serving. Choose foods that have a low amount or none of these fats. Check the number of milligrams (mg) of salt (sodium) in one serving. Most people should limit total sodium intake to less than 2,300 mg per day. Always check the nutrition information of foods labeled as "low-fat" or "nonfat." These foods may be higher in added sugar or refined carbs and should be avoided. Talk to your dietitian to identify your daily goals for nutrients listed on the label. Shopping Avoid buying canned, pre-made, or processed foods. These foods tend to be high in fat, sodium, and added sugar. Shop around the outside edge of the grocery store. This is where you will most often find fresh fruits and vegetables, bulk grains, fresh meats, and fresh dairy products. Cooking Use low-heat cooking methods, such as baking, instead of high-heat cooking methods, such as deep frying. Cook using healthy oils, such as olive, canola, or sunflower oil. Avoid cooking with butter, cream, or high-fat meats. Meal planning Eat meals and snacks regularly, preferably at the same times every day. Avoid going long periods of time without eating. Eat foods that are high in fiber, such as fresh fruits, vegetables, beans, and whole grains. Eat 4-6 oz (112-168 g) of lean protein each day, such as lean meat, chicken, fish, eggs, or tofu. One ounce (oz) (28 g) of lean protein is equal to: 1 oz (28 g) of meat, chicken, or  fish. 1 egg.  cup (62 g) of tofu. Eat some foods each day that contain healthy fats, such as avocado, nuts, seeds, and fish. What foods should I eat? Fruits Berries. Apples. Oranges. Peaches. Apricots. Plums. Grapes. Mangoes. Papayas. Pomegranates. Kiwi. Cherries. Vegetables Leafy greens, including lettuce, spinach, kale, chard, collard greens, mustard greens, and cabbage. Beets. Cauliflower. Broccoli. Carrots. Green beans. Tomatoes. Peppers. Onions. Cucumbers. Brussels sprouts. Grains Whole grains, such as whole-wheat or whole-grain bread, crackers, tortillas, cereal, and pasta. Unsweetened oatmeal. Quinoa. Brown or wild rice. Meats and other proteins Seafood. Poultry without skin. Lean cuts of poultry and beef. Tofu. Nuts. Seeds. Dairy Low-fat or fat-free dairy products such as milk, yogurt, and cheese. The items listed above may not be a complete list of foods and beverages you can eat and drink. Contact a dietitian for more information. What foods should I avoid?  Fruits Fruits canned with syrup. Vegetables Canned vegetables. Frozen vegetables with butter or cream sauce. Grains Refined white flour and flour products such as bread, pasta, snack foods, and cereals. Avoid all processed foods. Meats and other proteins Fatty cuts of meat. Poultry with skin. Breaded or fried meats. Processed meat. Avoid saturated fats. Dairy Full-fat yogurt, cheese, or milk. Beverages Sweetened drinks, such as soda or iced tea. The items listed above may not be a complete list of foods and beverages you should avoid. Contact a dietitian for more information. Questions to ask a health care provider Do I need to meet with a certified diabetes care and education specialist? Do I need to meet with a dietitian? What number can I call if I have questions? When are the best times to check my blood glucose? Where to find more information: American Diabetes Association: diabetes.org Academy of Nutrition and  Dietetics: eatright.[employer] of Diabetes and Digestive and Kidney Diseases: [employer] Association of Diabetes Care & Education Specialists: diabeteseducator.org Summary It is important to have healthy eating habits because your blood sugar (glucose) levels are greatly affected by what you eat and drink. It is important to use alcohol carefully. A healthy meal plan will help you manage your blood glucose and lower your risk of heart disease. Your health care provider may recommend that you work with a dietitian to make a meal plan that is best for you. This information is not intended to replace advice given to you by your health care provider. Make sure you discuss any questions you have with your health care provider. Document Revised: 10/19/2019 Document Reviewed: 10/19/2019 Elsevier Patient Education  [place], MD [facility]

## 2022-06-03 NOTE — Assessment & Plan Note (Signed)
Well-controlled symptoms with alfuzosin 10 mg at bedtime May be contributing to transient orthostatic symptoms

## 2022-06-03 NOTE — Patient Instructions (Signed)
Stop hydrochlorothiazide and stop Breztri if still taking it Continue all other medications Follow-up in 6 months.  Diabetes Mellitus and Nutrition, Adult When you have diabetes, or diabetes mellitus, it is very important to have healthy eating habits because your blood sugar (glucose) levels are greatly affected by what you eat and drink. Eating healthy foods in the right amounts, at about the same times every day, can help you: Manage your blood glucose. Lower your risk of heart disease. Improve your blood pressure. Reach or maintain a healthy weight. What can affect my meal plan? Every person with diabetes is different, and each person has different needs for a meal plan. Your health care provider may recommend that you work with a dietitian to make a meal plan that is best for you. Your meal plan may vary depending on factors such as: The calories you need. The medicines you take. Your weight. Your blood glucose, blood pressure, and cholesterol levels. Your activity level. Other health conditions you have, such as heart or kidney disease. How do carbohydrates affect me? Carbohydrates, also called carbs, affect your blood glucose level more than any other type of food. Eating carbs raises the amount of glucose in your blood. It is important to know how many carbs you can safely have in each meal. This is different for every person. Your dietitian can help you calculate how many carbs you should have at each meal and for each snack. How does alcohol affect me? Alcohol can cause a decrease in blood glucose (hypoglycemia), especially if you use insulin or take certain diabetes medicines by mouth. Hypoglycemia can be a life-threatening condition. Symptoms of hypoglycemia, such as sleepiness, dizziness, and confusion, are similar to symptoms of having too much alcohol. Do not drink alcohol if: Your health care provider tells you not to drink. You are pregnant, may be pregnant, or are planning  to become pregnant. If you drink alcohol: Limit how much you have to: 0-1 drink a day for women. 0-2 drinks a day for men. Know how much alcohol is in your drink. In the [place] one drink equals one 12 oz bottle of beer (355 mL), one 5 oz glass of wine (148 mL), or one 1 oz glass of hard liquor (44 mL). Keep yourself hydrated with water, diet soda, or unsweetened iced tea. Keep in mind that regular soda, juice, and other mixers may contain a lot of sugar and must be counted as carbs. What are tips for following this plan?  Reading food labels Start by checking the serving size on the Nutrition Facts label of packaged foods and drinks. The number of calories and the amount of carbs, fats, and other nutrients listed on the label are based on one serving of the item. Many items contain more than one serving per package. Check the total grams (g) of carbs in one serving. Check the number of grams of saturated fats and trans fats in one serving. Choose foods that have a low amount or none of these fats. Check the number of milligrams (mg) of salt (sodium) in one serving. Most people should limit total sodium intake to less than 2,300 mg per day. Always check the nutrition information of foods labeled as "low-fat" or "nonfat." These foods may be higher in added sugar or refined carbs and should be avoided. Talk to your dietitian to identify your daily goals for nutrients listed on the label. Shopping Avoid buying canned, pre-made, or processed foods. These foods tend to be high in  fat, sodium, and added sugar. Shop around the outside edge of the grocery store. This is where you will most often find fresh fruits and vegetables, bulk grains, fresh meats, and fresh dairy products. Cooking Use low-heat cooking methods, such as baking, instead of high-heat cooking methods, such as deep frying. Cook using healthy oils, such as olive, canola, or sunflower oil. Avoid cooking with butter, cream, or high-fat  meats. Meal planning Eat meals and snacks regularly, preferably at the same times every day. Avoid going long periods of time without eating. Eat foods that are high in fiber, such as fresh fruits, vegetables, beans, and whole grains. Eat 4-6 oz (112-168 g) of lean protein each day, such as lean meat, chicken, fish, eggs, or tofu. One ounce (oz) (28 g) of lean protein is equal to: 1 oz (28 g) of meat, chicken, or fish. 1 egg.  cup (62 g) of tofu. Eat some foods each day that contain healthy fats, such as avocado, nuts, seeds, and fish. What foods should I eat? Fruits Berries. Apples. Oranges. Peaches. Apricots. Plums. Grapes. Mangoes. Papayas. Pomegranates. Kiwi. Cherries. Vegetables Leafy greens, including lettuce, spinach, kale, chard, collard greens, mustard greens, and cabbage. Beets. Cauliflower. Broccoli. Carrots. Green beans. Tomatoes. Peppers. Onions. Cucumbers. Brussels sprouts. Grains Whole grains, such as whole-wheat or whole-grain bread, crackers, tortillas, cereal, and pasta. Unsweetened oatmeal. Quinoa. Brown or wild rice. Meats and other proteins Seafood. Poultry without skin. Lean cuts of poultry and beef. Tofu. Nuts. Seeds. Dairy Low-fat or fat-free dairy products such as milk, yogurt, and cheese. The items listed above may not be a complete list of foods and beverages you can eat and drink. Contact a dietitian for more information. What foods should I avoid? Fruits Fruits canned with syrup. Vegetables Canned vegetables. Frozen vegetables with butter or cream sauce. Grains Refined white flour and flour products such as bread, pasta, snack foods, and cereals. Avoid all processed foods. Meats and other proteins Fatty cuts of meat. Poultry with skin. Breaded or fried meats. Processed meat. Avoid saturated fats. Dairy Full-fat yogurt, cheese, or milk. Beverages Sweetened drinks, such as soda or iced tea. The items listed above may not be a complete list of foods and  beverages you should avoid. Contact a dietitian for more information. Questions to ask a health care provider Do I need to meet with a certified diabetes care and education specialist? Do I need to meet with a dietitian? What number can I call if I have questions? When are the best times to check my blood glucose? Where to find more information: American Diabetes Association: diabetes.org Academy of Nutrition and Dietetics: eatright.[employer] of Diabetes and Digestive and Kidney Diseases: [employer] Association of Diabetes Care & Education Specialists: diabeteseducator.org Summary It is important to have healthy eating habits because your blood sugar (glucose) levels are greatly affected by what you eat and drink. It is important to use alcohol carefully. A healthy meal plan will help you manage your blood glucose and lower your risk of heart disease. Your health care provider may recommend that you work with a dietitian to make a meal plan that is best for you. This information is not intended to replace advice given to you by your health care provider. Make sure you discuss any questions you have with your health care provider. Document Revised: 10/19/2019 Document Reviewed: 10/19/2019 Elsevier Patient Education  [place].

## 2022-06-03 NOTE — Assessment & Plan Note (Signed)
Medication induced. Needs to continue alfuzosin 10 mg at bedtime Recommend to stop hydrochlorothiazide 12.5 mg

## 2022-06-03 NOTE — Assessment & Plan Note (Signed)
Stable.  Recommend to stop Breztri, although I do not see it on his medication list Use albuterol only as needed

## 2022-06-03 NOTE — Assessment & Plan Note (Addendum)
Well-controlled diabetes with normal blood glucose levels at home and no episodes of hypoglycemia. Continue Humalog 75/25 15 units twice daily Continue metformin 500 mg twice a day and Jardiance 10 mg daily Diet and nutrition discussed. Continue simvastatin 20 mg daily.

## 2022-06-13 DIAGNOSIS — H409 Unspecified glaucoma: Secondary | ICD-10-CM | POA: Diagnosis not present

## 2022-06-13 DIAGNOSIS — E538 Deficiency of other specified B group vitamins: Secondary | ICD-10-CM | POA: Diagnosis not present

## 2022-06-13 DIAGNOSIS — H538 Other visual disturbances: Secondary | ICD-10-CM | POA: Diagnosis not present

## 2022-06-13 DIAGNOSIS — E785 Hyperlipidemia, unspecified: Secondary | ICD-10-CM | POA: Diagnosis not present

## 2022-06-13 DIAGNOSIS — I700 Atherosclerosis of aorta: Secondary | ICD-10-CM | POA: Diagnosis not present

## 2022-06-13 DIAGNOSIS — H9190 Unspecified hearing loss, unspecified ear: Secondary | ICD-10-CM | POA: Diagnosis not present

## 2022-06-13 DIAGNOSIS — Z7982 Long term (current) use of aspirin: Secondary | ICD-10-CM | POA: Diagnosis not present

## 2022-06-13 DIAGNOSIS — Z8546 Personal history of malignant neoplasm of prostate: Secondary | ICD-10-CM | POA: Diagnosis not present

## 2022-06-13 DIAGNOSIS — M810 Age-related osteoporosis without current pathological fracture: Secondary | ICD-10-CM | POA: Diagnosis not present

## 2022-06-13 DIAGNOSIS — Z7984 Long term (current) use of oral hypoglycemic drugs: Secondary | ICD-10-CM | POA: Diagnosis not present

## 2022-06-13 DIAGNOSIS — E1165 Type 2 diabetes mellitus with hyperglycemia: Secondary | ICD-10-CM | POA: Diagnosis not present

## 2022-06-13 DIAGNOSIS — Z9181 History of falling: Secondary | ICD-10-CM | POA: Diagnosis not present

## 2022-06-13 DIAGNOSIS — K219 Gastro-esophageal reflux disease without esophagitis: Secondary | ICD-10-CM | POA: Diagnosis not present

## 2022-06-13 DIAGNOSIS — N320 Bladder-neck obstruction: Secondary | ICD-10-CM | POA: Diagnosis not present

## 2022-06-13 DIAGNOSIS — E1142 Type 2 diabetes mellitus with diabetic polyneuropathy: Secondary | ICD-10-CM | POA: Diagnosis not present

## 2022-06-17 ENCOUNTER — Ambulatory Visit: Payer: Medicare Other

## 2022-06-17 DIAGNOSIS — E1165 Type 2 diabetes mellitus with hyperglycemia: Secondary | ICD-10-CM

## 2022-06-17 DIAGNOSIS — E1169 Type 2 diabetes mellitus with other specified complication: Secondary | ICD-10-CM

## 2022-06-18 DIAGNOSIS — Z794 Long term (current) use of insulin: Secondary | ICD-10-CM | POA: Diagnosis not present

## 2022-06-18 DIAGNOSIS — C61 Malignant neoplasm of prostate: Secondary | ICD-10-CM | POA: Diagnosis not present

## 2022-06-18 DIAGNOSIS — E785 Hyperlipidemia, unspecified: Secondary | ICD-10-CM | POA: Diagnosis not present

## 2022-06-18 DIAGNOSIS — Z8739 Personal history of other diseases of the musculoskeletal system and connective tissue: Secondary | ICD-10-CM | POA: Diagnosis not present

## 2022-06-18 DIAGNOSIS — G629 Polyneuropathy, unspecified: Secondary | ICD-10-CM | POA: Diagnosis not present

## 2022-06-18 DIAGNOSIS — E1165 Type 2 diabetes mellitus with hyperglycemia: Secondary | ICD-10-CM | POA: Diagnosis not present

## 2022-06-18 DIAGNOSIS — E559 Vitamin D deficiency, unspecified: Secondary | ICD-10-CM | POA: Diagnosis not present

## 2022-06-18 NOTE — Plan of Care (Signed)
Error Please Disregard

## 2022-06-19 DIAGNOSIS — E1165 Type 2 diabetes mellitus with hyperglycemia: Secondary | ICD-10-CM | POA: Diagnosis not present

## 2022-06-23 DIAGNOSIS — C61 Malignant neoplasm of prostate: Secondary | ICD-10-CM | POA: Diagnosis not present

## 2022-06-24 ENCOUNTER — Other Ambulatory Visit: Payer: Medicare Other

## 2022-06-24 NOTE — Progress Notes (Signed)
06/24/2022 Name: Douglas [name] MRN: [id number] DOB: 04-27-1935  Chief Complaint  Patient presents with   Medication Management   SHABAZ CROUSE is a 87 y.o. year old male who presented for a telephone visit.   They were referred to the pharmacist by their PCP for assistance in managing diabetes and hyperlipidemia.   Patient is participating in a Managed Medicaid Plan: No  Subjective: Telephone visit with patient and daughter to discuss recent diabetes diagnosis and medication management Care Team: Primary Care Provider: [name], MD ; Next Scheduled Visit: 07/08/22  Medication Access/Adherence Current Pharmacy:  [id number] [place], [place] [address] Phone: [phone] Fax: [phone]  Patient reports affordability concerns with their medications: No  Patient reports access/transportation concerns to their pharmacy: No  Patient reports adherence concerns with their medications:  No    Diabetes: Current medications: Humalog Mix 75/25 15 units BID, Metformin 500mg  BID, Jardiance 10mg  daily -Current glucose readings: FBG 3/26 96, 3/25 104; >2h post-prandial 3/25 94, 3/24 79 -Using One Touch Verio Flex meter; testing 2 times daily -Patient denies hypoglycemic s/sx including dizziness, shakiness, sweating. -Patient denies hyperglycemic symptoms including polyuria, polydipsia, polyphagia, nocturia, neuropathy, blurred vision. These were endorsed during February hospitalization when T2DM was diagnosed. -Patient endorses excellent adherence to medication and no barriers in regard to access or affordability -Desires to eventually be able to come off of insulin -Endorses lifestyle modifications around diet and and activity -States seen by endocrinology 3/21 and glucose was 106 but unsure if A1c was done  Hyperlipidemia/ASCVD Risk Reduction Current lipid lowering medications: Simvastatin  20mg  daily  Antiplatelet regimen: ASA 81mg  daily  -Endorses dietary modifications and increased activity  Objective: Lab Results  Component Value Date   HGBA1C 14.5 Repeated and verified X2. (H) 05/06/2022   Lab Results  Component Value Date   CREATININE 1.04 05/09/2022   BUN 15 05/09/2022   NA 136 05/09/2022   K 4.6 05/09/2022   CL 101 05/09/2022   CO2 28 05/09/2022   Lab Results  Component Value Date   CHOL 158 05/06/2022   HDL 32.30 (L) 05/06/2022   LDLCALC 51 08/09/2021   LDLDIRECT 43.0 05/06/2022   TRIG (H) 05/06/2022    583.0 Triglyceride is over 400; calculations on Lipids are invalid.   CHOLHDL 5 05/06/2022   Medications Reviewed Today     Reviewed by Darlina Guys, [facility] (Pharmacist) on 06/24/22 at 1204  Med List Status: <None>   Medication Order Taking? Sig Documenting Provider Last Dose Status Informant  acetaminophen (TYLENOL) 500 MG tablet [id number] Yes Take 500-1,000 mg by mouth every 6 (six) hours as needed for mild pain or headache. [provider] Taking Active Self  albuterol (VENTOLIN HFA) 108 (90 Base) MCG/ACT inhaler [id number] Yes TAKE 2 PUFFS BY MOUTH EVERY 6 HOURS AS NEEDED FOR WHEEZE OR SHORTNESS OF BREATH  Patient taking differently: Inhale 2 puffs into the lungs every 6 (six) hours as needed for wheezing or shortness of breath.   Maximiano Coss, NP Taking Active Self  alfuzosin (UROXATRAL) 10 MG 24 hr tablet [id number] Yes Take 10 mg by mouth at bedtime. [provider] Taking Active Self  aspirin EC 81 MG tablet [id number] Yes Take 81 mg by mouth at bedtime. Swallow whole. [provider] Taking Active Self  blood glucose meter kit and supplies [id number] Yes 1 each by Other route 3 (three) times daily. Adhikari,  Amrit, MD Taking Active            Med Note Paulina Fusi A   Tue Jun 24, 2022 12:03 PM) One Touch Verio Flex  brimonidine (ALPHAGAN) 0.2 % ophthalmic solution [id number] Yes Place 1 drop into the left eye daily.  [provider] Taking Active Self  Cholecalciferol 125 MCG (5000 UT) TABS [id number] Yes Take 1 tablet by mouth daily. [provider] Taking Active   dorzolamide-timolol (COSOPT) 2-0.5 % ophthalmic solution [id number] Yes Place 1 drop into both eyes 2 (two) times daily. [provider] Taking Active Self  empagliflozin (JARDIANCE) 10 MG TABS tablet [id number] Yes Take 1 tablet (10 mg total) by mouth daily before breakfast. [name], MD Taking Active   Glucagon (GVOKE HYPOPEN 2-PACK) 0.5 MG/0.1ML Darden Palmer [id number] Yes Inject 0.5 mg into the skin as needed. [name], MD Taking Active            Med Note Colin Rhein, [place] A   Tue Jun 24, 2022 12:04 PM) Has at home if needed  glucose blood test strip [id number] Yes Use as instructed to check his blood sugars 3 times a day [name], MD Taking Active            Med Note Colin Rhein, [place] A   Tue Jun 24, 2022 12:04 PM) One touch verio flex  Insulin Lispro Prot & Lispro (HUMALOG 75/25 MIX) (75-25) 100 UNIT/ML Claiborne Rigg [id number] Yes Inject 15 Units into the skin 2 (two) times daily. [name], MD Taking Active   Insulin Pen Needle (PEN NEEDLES 3/16") 31G X 5 MM MISC [id number] Yes 1 each by Does not apply route 2 (two) times daily. [name], MD Taking Active   Lancets [facility] ULTRASOFT) lancets [id number] Yes Use as instructed to check his blood sugars 3 times a day [name], MD Taking Active   latanoprost (XALATAN) 0.005 % ophthalmic solution [id number] Yes Place 1 drop into both eyes at bedtime. [provider] Taking Active Self  metFORMIN (GLUCOPHAGE) 500 MG tablet [id number] Yes Take 1 tablet (500 mg total) by mouth 2 (two) times daily with a meal. [name], Ines Bloomer, MD Taking Active   Multiple Vitamin (MULTIVITAMIN) tablet [id number] Yes Take 1 tablet by mouth at bedtime. [provider] Taking Active Self  pantoprazole (PROTONIX) 40 MG tablet [id number]  Yes TAKE 1 TABLET BY MOUTH EVERY DAY  Patient taking differently: Take 40 mg by mouth at bedtime.   [name], MD Taking Active Self  RESTASIS 0.05 % ophthalmic emulsion [id number] Yes Place 1 drop into both eyes 2 (two) times daily. [provider] Taking Active Self           Med Note Colin Rhein, Amya Hlad A   Tue Jun 24, 2022 11:12 AM) Using as needed if dry  RHOPRESSA 0.02 % SOLN [id number] Yes Place 1 drop into both eyes at bedtime. [provider] Taking Active Self  simvastatin (ZOCOR) 20 MG tablet [id number] Yes TAKE 1 TABLET DAILY AT 6PM.  Patient taking differently: Take 20 mg by mouth at bedtime.   [name], MD Taking Active Self           Assessment/Plan:  Diabetes: - Currently uncontrolled - Reviewed goal A1c, goal fasting, and goal 2 hour post prandial glucose - Reviewed s/sx of hypoglycemia and how to properly address - Recommend to continue to monitor at least twice daily and record  - Follow-up with endocrinology is scheduled for June; suggested  patient follow-up on A1c to discuss with endo if medication adjustments could be done (increase metformin to 1000mg  BID and Jardiance to 25mg  daily in an attempt to decrease/stop insulin)  Hyperlipidemia/ASCVD Risk Reduction: - Currently uncontrolled.  - Recommend lipid panel to evaluate recent lifestyle modifications - Patient likely needs high intensity statin (Rosuvastatin 20mg  daily) and/or therapy to decrease triglycerides (fenofibrate 48mg  daily -based on eGFR).  If therapy is changed/initiated, check lipids, LFT's, and kidney function in 6-8 weeks.  Follow Up Plan: None needed at this time, but patient/daughter have my number if needs/questions arise.  Darlina Guys, PharmD, DPLA

## 2022-06-25 ENCOUNTER — Telehealth: Payer: Self-pay | Admitting: Emergency Medicine

## 2022-06-25 DIAGNOSIS — H401122 Primary open-angle glaucoma, left eye, moderate stage: Secondary | ICD-10-CM | POA: Diagnosis not present

## 2022-06-25 DIAGNOSIS — H1045 Other chronic allergic conjunctivitis: Secondary | ICD-10-CM | POA: Diagnosis not present

## 2022-06-25 DIAGNOSIS — H401111 Primary open-angle glaucoma, right eye, mild stage: Secondary | ICD-10-CM | POA: Diagnosis not present

## 2022-06-25 DIAGNOSIS — H04123 Dry eye syndrome of bilateral lacrimal glands: Secondary | ICD-10-CM | POA: Diagnosis not present

## 2022-06-25 NOTE — Telephone Encounter (Signed)
Prescription Request  06/25/2022  LOV: 06/03/2022  What is the name of the medication or equipment?  Insulin Pen Needle (PEN NEEDLES 3/16") 31G X 5 MM MISC   Have you contacted your pharmacy to request a refill? No   Which pharmacy would you like this sent to?  [id number] - [place], [place] [address] Phone: [phone] Fax: [phone]    Patient notified that their request is being sent to the clinical staff for review and that they should receive a response within 2 business days.   Please advise at Mobile [phone] (mobile)

## 2022-06-26 ENCOUNTER — Other Ambulatory Visit: Payer: Self-pay | Admitting: *Deleted

## 2022-06-26 MED ORDER — "PEN NEEDLES 3/16"" 31G X 5 MM MISC": 1.0000 | Freq: Two times a day (BID) | 0 refills | Status: DC

## 2022-06-26 NOTE — Progress Notes (Signed)
New prescription sent to patient pharmacy on file 

## 2022-07-02 DIAGNOSIS — E291 Testicular hypofunction: Secondary | ICD-10-CM | POA: Diagnosis not present

## 2022-07-02 DIAGNOSIS — C61 Malignant neoplasm of prostate: Secondary | ICD-10-CM | POA: Diagnosis not present

## 2022-07-02 DIAGNOSIS — N393 Stress incontinence (female) (male): Secondary | ICD-10-CM | POA: Diagnosis not present

## 2022-07-04 ENCOUNTER — Other Ambulatory Visit: Payer: Self-pay | Admitting: Emergency Medicine

## 2022-07-04 DIAGNOSIS — N320 Bladder-neck obstruction: Secondary | ICD-10-CM | POA: Diagnosis not present

## 2022-07-04 DIAGNOSIS — E785 Hyperlipidemia, unspecified: Secondary | ICD-10-CM

## 2022-07-04 DIAGNOSIS — I700 Atherosclerosis of aorta: Secondary | ICD-10-CM | POA: Diagnosis not present

## 2022-07-04 DIAGNOSIS — E1165 Type 2 diabetes mellitus with hyperglycemia: Secondary | ICD-10-CM | POA: Diagnosis not present

## 2022-07-04 DIAGNOSIS — E1142 Type 2 diabetes mellitus with diabetic polyneuropathy: Secondary | ICD-10-CM | POA: Diagnosis not present

## 2022-07-04 DIAGNOSIS — H409 Unspecified glaucoma: Secondary | ICD-10-CM | POA: Diagnosis not present

## 2022-07-08 ENCOUNTER — Telehealth: Payer: Medicare Other

## 2022-07-15 ENCOUNTER — Ambulatory Visit (INDEPENDENT_AMBULATORY_CARE_PROVIDER_SITE_OTHER): Payer: Medicare Other

## 2022-07-15 ENCOUNTER — Telehealth: Payer: Self-pay

## 2022-07-15 ENCOUNTER — Other Ambulatory Visit: Payer: Self-pay | Admitting: Emergency Medicine

## 2022-07-15 VITALS — Ht 71.5 in | Wt 190.0 lb

## 2022-07-15 DIAGNOSIS — Z0000 Encounter for general adult medical examination without abnormal findings: Secondary | ICD-10-CM | POA: Diagnosis not present

## 2022-07-15 MED ORDER — LINACLOTIDE 72 MCG PO CAPS: 72.0000 ug | ORAL_CAPSULE | Freq: Every day | ORAL | 1 refills | Status: DC

## 2022-07-15 NOTE — Patient Instructions (Signed)
Douglas [name] , Thank you for taking time to come for your Medicare Wellness Visit. I appreciate your ongoing commitment to your health goals. Please review the following plan we discussed and let me know if I can assist you in the future.   These are the goals we discussed:  Goals      CCM Expected Outcome:  Monitor, Self-Manage and Reduce Symptoms of:     Just started metfor Taking jardian May wean off of insulin Has glaucoma.Marland Kitchen last appointmetn in February,  Two falls d/t low bs Has a cane but doesn't use it. Blood sugar 91 today. Usually in 90s when checking.. 89 last night If insulin is consistenly low, monitor, second dose around 5 or 6,  SEND LIST OF BREADS, LOW CARB OPTION PREFERS MAIL OUT EDUCATIONAL RESOURCES MESSAGE PCP REGARDING NEW ORDER FOR INSULIN APPLE JUICE, ORANGE JUICE, SODA IF NEEDED KEEP PEN UPSTAIRS AND DOWNSTAIRS....   Blood sugars..  3/13-108-100 3/14-97-127 3/15-105-103 3/16-104-96 3/17-91-87 3/18-94-91 3/19-91 today  87 on 3/4 82lowest  123 on the 5th  [facility]- doing own    BP Readings from Last 3 Encounters:  06/03/22 116/80  05/12/22 130/68  05/09/22 135/71      Lab Results  Component Value Date   HGBA1C 14.5 Repeated and verified X2. (H) 05/06/2022        Eat more fruits and vegetables     Patient eats plenty of vegetables but will try to eat more fruit daily.    Wants to get off injections     Patient Stated        This is a list of the screening recommended for you and due dates:  Health Maintenance  Topic Date Due   Complete foot exam   03/08/2020   Colon Cancer Screening  04/30/2020   Eye exam for diabetics  02/28/2021   COVID-19 Vaccine (6 - 2023-24 season) 11/29/2021   Zoster (Shingles) Vaccine (1 of 2) 09/03/2022*   Flu Shot  10/30/2022   Hemoglobin A1C  11/04/2022   Yearly kidney health urinalysis for diabetes  05/07/2023   Yearly kidney function blood test for diabetes  05/10/2023   Medicare Annual Wellness Visit   07/15/2023   Pneumonia Vaccine  Completed   HPV Vaccine  Aged Out   DTaP/Tdap/Td vaccine  Discontinued  *Topic was postponed. The date shown is not the original due date.    Advanced directives   Will pick up copy from office  Conditions/risks identified: Keep up the good work  Next appointment: Follow up in one year for your annual wellness visit. 07/20/23  Preventive Care 65 Years and Older, Male  Preventive care refers to lifestyle choices and visits with your health care provider that can promote health and wellness. What does preventive care include? A yearly physical exam. This is also called an annual well check. Dental exams once or twice a year. Routine eye exams. Ask your health care provider how often you should have your eyes checked. Personal lifestyle choices, including: Daily care of your teeth and gums. Regular physical activity. Eating a healthy diet. Avoiding tobacco and drug use. Limiting alcohol use. Practicing safe sex. Taking low doses of aspirin every day. Taking vitamin and mineral supplements as recommended by your health care provider. What happens during an annual well check? The services and screenings done by your health care provider during your annual well check will depend on your age, overall health, lifestyle risk factors, and family history of disease. Counseling  Your health  care provider may ask you questions about your: Alcohol use. Tobacco use. Drug use. Emotional well-being. Home and relationship well-being. Sexual activity. Eating habits. History of falls. Memory and ability to understand (cognition). Work and work Astronomer. Screening  You may have the following tests or measurements: Height, weight, and BMI. Blood pressure. Lipid and cholesterol levels. These may be checked every 5 years, or more frequently if you are over 28 years old. Skin check. Lung cancer screening. You may have this screening every year starting at  age 53 if you have a 30-pack-year history of smoking and currently smoke or have quit within the past 15 years. Fecal occult blood test (FOBT) of the stool. You may have this test every year starting at age 84. Flexible sigmoidoscopy or colonoscopy. You may have a sigmoidoscopy every 5 years or a colonoscopy every 10 years starting at age 62. Prostate cancer screening. Recommendations will vary depending on your family history and other risks. Hepatitis C blood test. Hepatitis B blood test. Sexually transmitted disease (STD) testing. Diabetes screening. This is done by checking your blood sugar (glucose) after you have not eaten for a while (fasting). You may have this done every 1-3 years. Abdominal aortic aneurysm (AAA) screening. You may need this if you are a current or former smoker. Osteoporosis. You may be screened starting at age 41 if you are at high risk. Talk with your health care provider about your test results, treatment options, and if necessary, the need for more tests. Vaccines  Your health care provider may recommend certain vaccines, such as: Influenza vaccine. This is recommended every year. Tetanus, diphtheria, and acellular pertussis (Tdap, Td) vaccine. You may need a Td booster every 10 years. Zoster vaccine. You may need this after age 30. Pneumococcal 13-valent conjugate (PCV13) vaccine. One dose is recommended after age 20. Pneumococcal polysaccharide (PPSV23) vaccine. One dose is recommended after age 68. Talk to your health care provider about which screenings and vaccines you need and how often you need them. This information is not intended to replace advice given to you by your health care provider. Make sure you discuss any questions you have with your health care provider. Document Released: 04/13/2015 Document Revised: 12/05/2015 Document Reviewed: 01/16/2015 Elsevier Interactive Patient Education  2017 [employer].  Fall Prevention in the Home Falls can  cause injuries. They can happen to people of all ages. There are many things you can do to make your home safe and to help prevent falls. What can I do on the outside of my home? Regularly fix the edges of walkways and driveways and fix any cracks. Remove anything that might make you trip as you walk through a door, such as a raised step or threshold. Trim any bushes or trees on the path to your home. Use bright outdoor lighting. Clear any walking paths of anything that might make someone trip, such as rocks or tools. Regularly check to see if handrails are loose or broken. Make sure that both sides of any steps have handrails. Any raised decks and porches should have guardrails on the edges. Have any leaves, snow, or ice cleared regularly. Use sand or salt on walking paths during winter. Clean up any spills in your garage right away. This includes oil or grease spills. What can I do in the bathroom? Use night lights. Install grab bars by the toilet and in the tub and shower. Do not use towel bars as grab bars. Use non-skid mats or decals in the  tub or shower. If you need to sit down in the shower, use a plastic, non-slip stool. Keep the floor dry. Clean up any water that spills on the floor as soon as it happens. Remove soap buildup in the tub or shower regularly. Attach bath mats securely with double-sided non-slip rug tape. Do not have throw rugs and other things on the floor that can make you trip. What can I do in the bedroom? Use night lights. Make sure that you have a light by your bed that is easy to reach. Do not use any sheets or blankets that are too big for your bed. They should not hang down onto the floor. Have a firm chair that has side arms. You can use this for support while you get dressed. Do not have throw rugs and other things on the floor that can make you trip. What can I do in the kitchen? Clean up any spills right away. Avoid walking on wet floors. Keep items  that you use a lot in easy-to-reach places. If you need to reach something above you, use a strong step stool that has a grab bar. Keep electrical cords out of the way. Do not use floor polish or wax that makes floors slippery. If you must use wax, use non-skid floor wax. Do not have throw rugs and other things on the floor that can make you trip. What can I do with my stairs? Do not leave any items on the stairs. Make sure that there are handrails on both sides of the stairs and use them. Fix handrails that are broken or loose. Make sure that handrails are as long as the stairways. Check any carpeting to make sure that it is firmly attached to the stairs. Fix any carpet that is loose or worn. Avoid having throw rugs at the top or bottom of the stairs. If you do have throw rugs, attach them to the floor with carpet tape. Make sure that you have a light switch at the top of the stairs and the bottom of the stairs. If you do not have them, ask someone to add them for you. What else can I do to help prevent falls? Wear shoes that: Do not have high heels. Have rubber bottoms. Are comfortable and fit you well. Are closed at the toe. Do not wear sandals. If you use a stepladder: Make sure that it is fully opened. Do not climb a closed stepladder. Make sure that both sides of the stepladder are locked into place. Ask someone to hold it for you, if possible. Clearly mark and make sure that you can see: Any grab bars or handrails. First and last steps. Where the edge of each step is. Use tools that help you move around (mobility aids) if they are needed. These include: Canes. Walkers. Scooters. Crutches. Turn on the lights when you go into a dark area. Replace any light bulbs as soon as they burn out. Set up your furniture so you have a clear path. Avoid moving your furniture around. If any of your floors are uneven, fix them. If there are any pets around you, be aware of where they  are. Review your medicines with your doctor. Some medicines can make you feel dizzy. This can increase your chance of falling. Ask your doctor what other things that you can do to help prevent falls. This information is not intended to replace advice given to you by your health care provider. Make sure you discuss any  questions you have with your health care provider. Document Released: 01/11/2009 Document Revised: 08/23/2015 Document Reviewed: 04/21/2014 Elsevier Interactive Patient Education  2017 [employer].

## 2022-07-15 NOTE — Telephone Encounter (Signed)
Spoke with patient at [facility], and he stated that he currently have been experiencing constipation with little to no relief with OTC stool softeners.    Patient requested a medication to relieve his symptoms.  Please advise.

## 2022-07-15 NOTE — Telephone Encounter (Signed)
Spoke with  patient, and daughter Steward Drone.   Message given voiced understanding.

## 2022-07-15 NOTE — Telephone Encounter (Signed)
New prescription for Linzess 75 mg sent to pharmacy of record but it may not be covered by his insurance. Always recommend MiraLAX or milk of magnesium. Thanks.

## 2022-07-15 NOTE — Progress Notes (Signed)
Subjective:   Douglas [name] is a 87 y.o. male who presents for Medicare Annual/Subsequent preventive examination.  I connected with  Douglas [name] on 07/15/22 by a audio enabled telemedicine application and verified that I am speaking with the correct person using two identifiers.  Patient Location: Home  Provider Location: Home Office  I discussed the limitations of evaluation and management by telemedicine. The patient expressed understanding and agreed to proceed.     Review of Systems     Cardiac Risk Factors include: advanced age (>19men, >71 women);dyslipidemia;hypertension     Objective:    Today's Vitals   07/15/22 1109  Weight: 190 lb (86.2 kg)  Height: 5' 11.5" (1.816 m)   Body mass index is 26.13 kg/m.     05/08/2022    2:51 PM 05/07/2022   10:13 AM 07/12/2021    1:38 PM 03/12/2020   11:04 AM 01/04/2019   11:12 AM 04/30/2017    8:09 AM 11/14/2016   10:41 AM  Advanced Directives  Does Patient Have a Medical Advance Directive?  No Yes Yes Yes Yes No  Type of Best boy of [place];Living will Healthcare Power of [employer] Power of [employer] Power of [place];Living will   Does patient want to make changes to medical advance directive?       No - Patient declined  Copy of Healthcare Power of Attorney in Chart?   No - copy requested No - copy requested     Would patient like information on creating a medical advance directive? No - Patient declined      No - Patient declined    Current Medications (verified) Outpatient Encounter Medications as of 07/15/2022  Medication Sig   acetaminophen (TYLENOL) 500 MG tablet Take 500-1,000 mg by mouth every 6 (six) hours as needed for mild pain or headache.   albuterol (VENTOLIN HFA) 108 (90 Base) MCG/ACT inhaler TAKE 2 PUFFS BY MOUTH EVERY 6 HOURS AS NEEDED FOR WHEEZE OR SHORTNESS OF BREATH (Patient taking differently: Inhale 2 puffs into the lungs every 6 (six) hours as needed  for wheezing or shortness of breath.)   alfuzosin (UROXATRAL) 10 MG 24 hr tablet Take 10 mg by mouth at bedtime.   aspirin EC 81 MG tablet Take 81 mg by mouth at bedtime. Swallow whole.   blood glucose meter kit and supplies 1 each by Other route 3 (three) times daily.   brimonidine (ALPHAGAN) 0.2 % ophthalmic solution Place 1 drop into the left eye daily.   Cholecalciferol 125 MCG (5000 UT) TABS Take 1 tablet by mouth daily.   dorzolamide-timolol (COSOPT) 2-0.5 % ophthalmic solution Place 1 drop into both eyes 2 (two) times daily.   empagliflozin (JARDIANCE) 10 MG TABS tablet Take 1 tablet (10 mg total) by mouth daily before breakfast.   Glucagon (GVOKE HYPOPEN 2-PACK) 0.5 MG/0.1ML SOAJ Inject 0.5 mg into the skin as needed.   glucose blood test strip Use as instructed to check his blood sugars 3 times a day   Insulin Lispro Prot & Lispro (HUMALOG 75/25 MIX) (75-25) 100 UNIT/ML Kwikpen Inject 15 Units into the skin 2 (two) times daily.   Insulin Pen Needle (PEN NEEDLES 3/16") 31G X 5 MM MISC 1 each by Does not apply route 2 (two) times daily.   Lancets (ONETOUCH ULTRASOFT) lancets Use as instructed to check his blood sugars 3 times a day   latanoprost (XALATAN) 0.005 % ophthalmic solution Place 1 drop into both eyes  at bedtime.   metFORMIN (GLUCOPHAGE) 500 MG tablet Take 1 tablet (500 mg total) by mouth 2 (two) times daily with a meal.   Multiple Vitamin (MULTIVITAMIN) tablet Take 1 tablet by mouth at bedtime.   pantoprazole (PROTONIX) 40 MG tablet TAKE 1 TABLET BY MOUTH EVERY DAY (Patient taking differently: Take 40 mg by mouth at bedtime.)   RESTASIS 0.05 % ophthalmic emulsion Place 1 drop into both eyes 2 (two) times daily.   RHOPRESSA 0.02 % SOLN Place 1 drop into both eyes at bedtime.   simvastatin (ZOCOR) 20 MG tablet TAKE 1 TABLET DAILY AT 6PM.   No facility-administered encounter medications on file as of 07/15/2022.    Allergies (verified) Patient has no known allergies.    History: Past Medical History:  Diagnosis Date   Adenomatous colon polyp 03/2001   Allergy    SEASONAL   Cancer    Cataract    Dizziness    GERD (gastroesophageal reflux disease)    Hearing loss    Hyperlipidemia    Low testosterone    Past Surgical History:  Procedure Laterality Date   APPENDECTOMY     COLONOSCOPY     EYE SURGERY     POLYPECTOMY     PROSTATE SURGERY     SPINE SURGERY     Family History  Problem Relation Age of Onset   Cancer Father 7       pancreatic   Other Mother        age 40   Prostate cancer Brother    Colon cancer Neg Hx    Esophageal cancer Neg Hx    Liver cancer Neg Hx    Pancreatic cancer Neg Hx    Rectal cancer Neg Hx    Stomach cancer Neg Hx    Social History   Socioeconomic History   Marital status: Married    Spouse name: Not on file   Number of children: 3   Years of education: 2 years [employer]   Highest education level: Not on file  Occupational History   Occupation: RETIRED  Tobacco Use   Smoking status: Never   Smokeless tobacco: Never  Vaping Use   Vaping Use: Never used  Substance and Sexual Activity   Alcohol use: No    Alcohol/week: 0.0 standard drinks of alcohol   Drug use: No   Sexual activity: Yes  Other Topics Concern   Not on file  Social History Narrative   Married. Education: [employer]. Exercise: Yes.   Right-handed.   Drinks coffee daily.   Lives at home with his wife.   Social Determinants of Health   Financial Resource Strain: Low Risk  (07/12/2021)   Overall Financial Resource Strain (CARDIA)    Difficulty of Paying Living Expenses: Not hard at all  Food Insecurity: No Food Insecurity (06/17/2022)   Hunger Vital Sign    Worried About Running Out of Food in the Last Year: Never true    Ran Out of Food in the Last Year: Never true  Transportation Needs: No Transportation Needs (06/17/2022)   PRAPARE - Administrator, Civil Service (Medical): No    Lack of Transportation (Non-Medical): No   Physical Activity: Sufficiently Active (07/12/2021)   Exercise Vital Sign    Days of Exercise per Week: 3 days    Minutes of Exercise per Session: 50 min  Stress: No Stress Concern Present (07/12/2021)   [employer] of Occupational Health - Occupational Stress Questionnaire    Feeling  of Stress : Not at all  Social Connections: Moderately Integrated (07/15/2022)   Social Connection and Isolation Panel [NHANES]    Frequency of Communication with Friends and Family: More than three times a week    Frequency of Social Gatherings with Friends and Family: More than three times a week    Attends Religious Services: Never    Database administrator or Organizations: No    Attends Engineer, structural: More than 4 times per year    Marital Status: Married    Tobacco Counseling Counseling given: Not Answered   Clinical Intake:  Pre-visit preparation completed: Yes  Pain : No/denies pain     BMI - recorded: 26.13 Nutritional Risks: None Diabetes: No  How often do you need to have someone help you when you read instructions, pamphlets, or other written materials from your doctor or pharmacy?: 1 - Never  Diabetic? Yes  Interpreter Needed?: No  Information entered by :: Kandis Cocking, CMA   Activities of Daily Living    07/15/2022   11:24 AM 05/07/2022    8:16 PM  In your present state of health, do you have any difficulty performing the following activities:  Hearing? 0   Vision? 0   Difficulty concentrating or making decisions? 0   Walking or climbing stairs? 0   Dressing or bathing? 0   Doing errands, shopping? 0 0  Preparing Food and eating ? N   Using the Toilet? N   In the past six months, have you accidently leaked urine? Y   Do you have problems with loss of bowel control? Y   Managing your Medications? N   Managing your Finances? N   Housekeeping or managing your Housekeeping? N     Patient Care Team: Georgina Quint, MD as PCP - General  (Internal Medicine) Mateo Flow, MD as Consulting Physician (Ophthalmology) Jethro Bolus, MD (Inactive) as Consulting Physician (Urology) Flo Shanks, MD as Consulting Physician (Otolaryngology) Juanell Fairly, RN as Case Manager  Indicate any recent Medical Services you may have received from other than [facility] providers in the past year (date may be approximate).     Assessment:   This is a routine wellness examination for Ndrew.  Hearing/Vision screen Hearing Screening - Comments:: Bilateral hearing aids- don't wear often  Dietary issues and exercise activities discussed: Current Exercise Habits: Home exercise routine, Type of exercise: walking;treadmill;stretching, Time (Minutes): 45, Frequency (Times/Week): 6, Weekly Exercise (Minutes/Week): 270, Intensity: Mild   Goals Addressed             This Visit's Progress    Eat more fruits and vegetables       Patient eats plenty of vegetables but will try to eat more fruit daily.    Wants to get off injections      Depression Screen    06/03/2022    9:52 AM 05/12/2022    1:05 PM 05/06/2022    1:37 PM 04/21/2022   10:31 AM 03/06/2022    2:04 PM 02/05/2022   10:44 AM 11/05/2021    3:12 PM  PHQ 2/9 Scores  PHQ - 2 Score 0 0 0 0 0 0 0    Fall Risk    07/15/2022   11:14 AM 06/03/2022    9:52 AM 05/12/2022    1:05 PM 05/06/2022    1:37 PM 04/21/2022   10:31 AM  Fall Risk   Falls in the past year? 1 0 0 1 0  Number falls in  past yr: 1 0 0 1 0  Injury with Fall? 0 0 0 1 0  Comment no injuries      Risk for fall due to : No Fall Risks;History of fall(s) No Fall Risks No Fall Risks Impaired balance/gait No Fall Risks  Follow up Falls prevention discussed Falls evaluation completed Falls evaluation completed Falls evaluation completed Falls evaluation completed    FALL RISK PREVENTION PERTAINING TO THE HOME:  Any stairs in or around the home? Yes  If so, are there any without handrails? No  Home free of loose throw rugs  in walkways, pet beds, electrical cords, etc? Yes  Adequate lighting in your home to reduce risk of falls? Yes   ASSISTIVE DEVICES UTILIZED TO PREVENT FALLS:  Life alert? No  Use of a cane, walker or w/c? No  Grab bars in the bathroom? No  Shower chair or bench in shower? Yes  Elevated toilet seat or a handicapped toilet? No  TIMED UP AND GO:  Was the test performed? No . Televisit Cognitive Function:        07/15/2022   11:29 AM 03/12/2020   11:02 AM 01/04/2019   11:10 AM 11/14/2016   10:49 AM  6CIT Screen  What Year? 0 points 0 points 0 points 0 points  What month? 0 points 0 points 0 points 0 points  What time? 0 points 0 points 0 points 0 points  Count back from 20 0 points 0 points 0 points 0 points  Months in reverse 0 points 0 points 0 points 2 points  Repeat phrase 2 points 0 points 0 points 0 points  Total Score 2 points 0 points 0 points 2 points    Immunizations Immunization History  Administered Date(s) Administered   Fluad Quad(high Dose 65+) 11/30/2018, 12/27/2019, 12/12/2020, 01/12/2022   Influenza Split 12/30/2010, 12/25/2011   Influenza,inj,Quad PF,6+ Mos 12/07/2012, 12/13/2013, 12/06/2014, 12/07/2015, 12/18/2016, 12/21/2017   PFIZER(Purple Top)SARS-COV-2 Vaccination 05/07/2019, 05/30/2019, 01/09/2020   Pneumococcal Conjugate-13 04/24/2014   Pneumococcal Polysaccharide-23 05/31/2001, 06/08/2009   Td 06/12/2008   Td (Adult), 2 Lf Tetanus Toxid, Preservative Free 06/12/2008   Zoster, Live 03/31/2008    TDAP status: Due, Education has been provided regarding the importance of this vaccine. Advised may receive this vaccine at local pharmacy or Health Dept. Aware to provide a copy of the vaccination record if obtained from local pharmacy or Health Dept. Verbalized acceptance and understanding.  Flu Vaccine status: Up to date  Pneumococcal vaccine status: Completed during today's visit.  Covid-19 vaccine status: Information provided on how to obtain  vaccines.   Qualifies for Shingles Vaccine? Yes   Zostavax completed Yes   Shingrix Completed?: Yes  Screening Tests Health Maintenance  Topic Date Due   FOOT EXAM  03/08/2020   COLONOSCOPY (Pts 45-56yrs Insurance coverage will need to be confirmed)  04/30/2020   OPHTHALMOLOGY EXAM  02/28/2021   COVID-19 Vaccine (4 - 2023-24 season) 11/29/2021   Zoster Vaccines- Shingrix (1 of 2) 09/03/2022 (Originally 05/28/1954)   INFLUENZA VACCINE  10/30/2022   HEMOGLOBIN A1C  11/04/2022   Diabetic kidney evaluation - Urine ACR  05/07/2023   Diabetic kidney evaluation - eGFR measurement  05/10/2023   Medicare Annual Wellness (AWV)  07/15/2023   Pneumonia Vaccine 9+ Years old  Completed   HPV VACCINES  Aged Out   DTaP/Tdap/Td  Discontinued    Health Maintenance  Health Maintenance Due  Topic Date Due   FOOT EXAM  03/08/2020   COLONOSCOPY (Pts 45-77yrs Insurance  coverage will need to be confirmed)  04/30/2020   OPHTHALMOLOGY EXAM  02/28/2021   COVID-19 Vaccine (4 - 2023-24 season) 11/29/2021    Colorectal cancer screening: No longer required.   Lung Cancer Screening: (Low Dose CT Chest recommended if Age 8-80 years, 30 pack-year currently smoking OR have quit w/in 15years.) does not qualify.   Lung Cancer Screening Referral: N/A  Additional Screening:  Hepatitis C Screening: does not qualify; Completed No   Vision Screening: Recommended annual ophthalmology exams for early detection of glaucoma and other disorders of the eye. Is the patient up to date with their annual eye exam?  Yes  Who is the provider or what is the name of the office in which the patient attends annual eye exams? Dr. Salvadore Oxford at [facility] If pt is not established with a provider, would they like to be referred to a provider to establish care? No .   Dental Screening: Recommended annual dental exams for proper oral hygiene  Community Resource Referral / Chronic Care Management: CRR required this visit?  No    CCM required this visit?  No      Plan:     I have personally reviewed and noted the following in the patient's chart:   Medical and social history Use of alcohol, tobacco or illicit drugs  Current medications and supplements including opioid prescriptions. Patient is not currently taking opioid prescriptions. Functional ability and status Nutritional status Physical activity Advanced directives List of other physicians Hospitalizations, surgeries, and ER visits in previous 12 months Vitals Screenings to include cognitive, depression, and falls Referrals and appointments  In addition, I have reviewed and discussed with patient certain preventive protocols, quality metrics, and best practice recommendations. A written personalized care plan for preventive services as well as general preventive health recommendations were provided to patient.     Milus Mallick, CMA   07/15/2022   Nurse Notes: None

## 2022-07-18 ENCOUNTER — Ambulatory Visit: Payer: Medicare Other

## 2022-07-18 NOTE — Chronic Care Management (AMB) (Signed)
  Chronic Care Management   CCM RN Visit Note  07/18/2022 Name: DANGER MORSCH MRN: [id number] DOB: May 14, 1935  Subjective: PRATHER LINENBERGER is a 87 y.o. year old male who is a primary care patient of Alvy Bimler, Eilleen Kempf, MD. The patient was referred to the Chronic Care Management team for assistance with care management needs subsequent to provider initiation of CCM services and plan of care.    Today's Visit:  Engaged with patient by telephone for follow up visit.

## 2022-07-31 ENCOUNTER — Other Ambulatory Visit: Payer: Self-pay | Admitting: Internal Medicine

## 2022-08-08 ENCOUNTER — Telehealth: Payer: Self-pay | Admitting: Emergency Medicine

## 2022-08-08 NOTE — Telephone Encounter (Signed)
Prescription Request  08/08/2022  LOV: 06/03/2022  What is the name of the medication or equipment? Pt was prescribed Insulin Lispro Prot & Lispro (HUMALOG 75/25 MIX) (75-25) by Dr Burnadette Pop and he wanted to know you can start prescribing the medication to him.  Have you contacted your pharmacy to request a refill? No   Which pharmacy would you like this sent to?  [id number] [place], [place] - [address] AT [facility] [address] [place] [address] Phone: [phone] Fax: [phone]    Patient notified that their request is being sent to the clinical staff for review and that they should receive a response within 2 business days.   Please advise at Mobile [phone] (mobile)

## 2022-08-10 NOTE — Telephone Encounter (Signed)
Patient is supposed to follow-up with endocrinology.  I think he already did.  Insulin prescriptions should be prescribed by their office at this point.  Thanks.

## 2022-08-11 ENCOUNTER — Other Ambulatory Visit: Payer: Self-pay | Admitting: Emergency Medicine

## 2022-08-11 MED ORDER — INSULIN LISPRO PROT & LISPRO (75-25 MIX) 100 UNIT/ML KWIKPEN: 15.0000 [IU] | PEN_INJECTOR | Freq: Two times a day (BID) | SUBCUTANEOUS | 0 refills | Status: DC

## 2022-08-11 NOTE — Telephone Encounter (Signed)
New prescription for Humalog insulin sent to pharmacy of record today.  Thanks.

## 2022-08-11 NOTE — Telephone Encounter (Signed)
Called patient and informed him that a new rx was sent to his pharmacy  

## 2022-08-12 ENCOUNTER — Other Ambulatory Visit: Payer: Self-pay | Admitting: Emergency Medicine

## 2022-08-15 ENCOUNTER — Telehealth: Payer: Medicare Other | Admitting: Emergency Medicine

## 2022-08-31 ENCOUNTER — Other Ambulatory Visit: Payer: Self-pay | Admitting: Emergency Medicine

## 2022-08-31 DIAGNOSIS — R0981 Nasal congestion: Secondary | ICD-10-CM

## 2022-08-31 DIAGNOSIS — K219 Gastro-esophageal reflux disease without esophagitis: Secondary | ICD-10-CM

## 2022-08-31 DIAGNOSIS — R0982 Postnasal drip: Secondary | ICD-10-CM

## 2022-09-10 ENCOUNTER — Other Ambulatory Visit: Payer: Self-pay | Admitting: Emergency Medicine

## 2022-09-10 DIAGNOSIS — E1165 Type 2 diabetes mellitus with hyperglycemia: Secondary | ICD-10-CM

## 2022-09-23 DIAGNOSIS — E785 Hyperlipidemia, unspecified: Secondary | ICD-10-CM | POA: Diagnosis not present

## 2022-09-23 DIAGNOSIS — E1165 Type 2 diabetes mellitus with hyperglycemia: Secondary | ICD-10-CM | POA: Diagnosis not present

## 2022-09-23 DIAGNOSIS — C61 Malignant neoplasm of prostate: Secondary | ICD-10-CM | POA: Diagnosis not present

## 2022-09-23 DIAGNOSIS — Z8739 Personal history of other diseases of the musculoskeletal system and connective tissue: Secondary | ICD-10-CM | POA: Diagnosis not present

## 2022-09-23 DIAGNOSIS — E559 Vitamin D deficiency, unspecified: Secondary | ICD-10-CM | POA: Diagnosis not present

## 2022-09-23 DIAGNOSIS — Z794 Long term (current) use of insulin: Secondary | ICD-10-CM | POA: Diagnosis not present

## 2022-09-23 DIAGNOSIS — G629 Polyneuropathy, unspecified: Secondary | ICD-10-CM | POA: Diagnosis not present

## 2022-10-01 DIAGNOSIS — C61 Malignant neoplasm of prostate: Secondary | ICD-10-CM | POA: Diagnosis not present

## 2022-10-09 DIAGNOSIS — R3912 Poor urinary stream: Secondary | ICD-10-CM | POA: Diagnosis not present

## 2022-10-09 DIAGNOSIS — C61 Malignant neoplasm of prostate: Secondary | ICD-10-CM | POA: Diagnosis not present

## 2022-10-09 DIAGNOSIS — N3941 Urge incontinence: Secondary | ICD-10-CM | POA: Diagnosis not present

## 2022-10-09 DIAGNOSIS — E291 Testicular hypofunction: Secondary | ICD-10-CM | POA: Diagnosis not present

## 2022-10-25 DIAGNOSIS — H401111 Primary open-angle glaucoma, right eye, mild stage: Secondary | ICD-10-CM | POA: Diagnosis not present

## 2022-10-25 DIAGNOSIS — H401122 Primary open-angle glaucoma, left eye, moderate stage: Secondary | ICD-10-CM | POA: Diagnosis not present

## 2022-11-18 ENCOUNTER — Other Ambulatory Visit: Payer: Self-pay | Admitting: Emergency Medicine

## 2022-11-20 DIAGNOSIS — R351 Nocturia: Secondary | ICD-10-CM | POA: Diagnosis not present

## 2022-11-20 DIAGNOSIS — R350 Frequency of micturition: Secondary | ICD-10-CM | POA: Diagnosis not present

## 2022-11-20 DIAGNOSIS — N401 Enlarged prostate with lower urinary tract symptoms: Secondary | ICD-10-CM | POA: Diagnosis not present

## 2022-11-20 DIAGNOSIS — N3941 Urge incontinence: Secondary | ICD-10-CM | POA: Diagnosis not present

## 2022-12-09 DIAGNOSIS — Z23 Encounter for immunization: Secondary | ICD-10-CM | POA: Diagnosis not present

## 2022-12-24 ENCOUNTER — Other Ambulatory Visit: Payer: Medicare Other

## 2022-12-30 DIAGNOSIS — N3941 Urge incontinence: Secondary | ICD-10-CM | POA: Diagnosis not present

## 2022-12-30 DIAGNOSIS — R350 Frequency of micturition: Secondary | ICD-10-CM | POA: Diagnosis not present

## 2022-12-30 DIAGNOSIS — N401 Enlarged prostate with lower urinary tract symptoms: Secondary | ICD-10-CM | POA: Diagnosis not present

## 2022-12-30 DIAGNOSIS — R351 Nocturia: Secondary | ICD-10-CM | POA: Diagnosis not present

## 2023-01-01 DIAGNOSIS — H401111 Primary open-angle glaucoma, right eye, mild stage: Secondary | ICD-10-CM | POA: Diagnosis not present

## 2023-01-01 DIAGNOSIS — H401122 Primary open-angle glaucoma, left eye, moderate stage: Secondary | ICD-10-CM | POA: Diagnosis not present

## 2023-01-01 DIAGNOSIS — H02533 Eyelid retraction right eye, unspecified eyelid: Secondary | ICD-10-CM | POA: Diagnosis not present

## 2023-01-01 DIAGNOSIS — H04123 Dry eye syndrome of bilateral lacrimal glands: Secondary | ICD-10-CM | POA: Diagnosis not present

## 2023-01-01 DIAGNOSIS — Z961 Presence of intraocular lens: Secondary | ICD-10-CM | POA: Diagnosis not present

## 2023-01-01 DIAGNOSIS — H35033 Hypertensive retinopathy, bilateral: Secondary | ICD-10-CM | POA: Diagnosis not present

## 2023-01-01 LAB — HM DIABETES EYE EXAM

## 2023-01-02 DIAGNOSIS — H02533 Eyelid retraction right eye, unspecified eyelid: Secondary | ICD-10-CM | POA: Diagnosis not present

## 2023-01-03 ENCOUNTER — Other Ambulatory Visit: Payer: Self-pay | Admitting: Emergency Medicine

## 2023-01-03 DIAGNOSIS — E785 Hyperlipidemia, unspecified: Secondary | ICD-10-CM

## 2023-01-14 ENCOUNTER — Other Ambulatory Visit: Payer: Self-pay

## 2023-01-15 ENCOUNTER — Other Ambulatory Visit: Payer: Self-pay

## 2023-01-15 NOTE — Patient Outreach (Signed)
Care Management   Visit Note  01/15/2023 Name: Douglas [name] MRN: [id number] DOB: December 12, 1935  Subjective: Douglas [name] is a 87 y.o. year old male who is a primary care patient of [name], Eilleen Kempf, MD. The Care Management team was consulted for assistance.      Care Coordination: Primary Care Clinic

## 2023-01-16 NOTE — Patient Outreach (Signed)
Care Management   Visit Note   Name: VONTAE KAPLOWITZ MRN: [id number] DOB: 11-19-1935  Subjective: Douglas [name] is a 87 y.o. year old male who is a primary care patient of [name], Eilleen Kempf, MD. The Care Management team was consulted for assistance.      Engaged with patient via telephone.  Assessment:  Outpatient Encounter Medications as of 01/14/2023  Medication Sig Note   acetaminophen (TYLENOL) 500 MG tablet Take 500-1,000 mg by mouth every 6 (six) hours as needed for mild pain or headache.    albuterol (VENTOLIN HFA) 108 (90 Base) MCG/ACT inhaler TAKE 2 PUFFS BY MOUTH EVERY 6 HOURS AS NEEDED FOR WHEEZE OR SHORTNESS OF BREATH (Patient taking differently: Inhale 2 puffs into the lungs every 6 (six) hours as needed for wheezing or shortness of breath.)    brimonidine (ALPHAGAN) 0.2 % ophthalmic solution Place 1 drop into the left eye daily.    cetirizine (ZYRTEC) 10 MG tablet TAKE 1 TABLET BY MOUTH EVERY DAY    Cholecalciferol 125 MCG (5000 UT) TABS Take 1 tablet by mouth daily.    dorzolamide-timolol (COSOPT) 2-0.5 % ophthalmic solution Place 1 drop into both eyes 2 (two) times daily.    Glucagon (GVOKE HYPOPEN 2-PACK) 0.5 MG/0.1ML SOAJ Inject 0.5 mg into the skin as needed. 01/14/2023: Using as needed   Insulin Lispro Prot & Lispro (HUMALOG 75/25 MIX) (75-25) 100 UNIT/ML Kwikpen Inject 15 Units into the skin 2 (two) times daily. 01/14/2023: Reports taking 10 units and 8 units in the evening.   JARDIANCE 10 MG TABS tablet TAKE 1 TABLET BY MOUTH DAILY BEFORE BREAKFAST.    latanoprost (XALATAN) 0.005 % ophthalmic solution Place 1 drop into both eyes at bedtime.    linaclotide (LINZESS) 72 MCG capsule Take 1 capsule (72 mcg total) by mouth daily before breakfast. (Patient not taking: Reported on 01/14/2023)    metFORMIN (GLUCOPHAGE) 500 MG tablet Take 1 tablet (500 mg total) by mouth 2 (two) times daily with a meal.    Multiple Vitamin (MULTIVITAMIN) tablet Take 1 tablet by mouth at  bedtime.    pantoprazole (PROTONIX) 40 MG tablet TAKE 1 TABLET BY MOUTH EVERY DAY    RESTASIS 0.05 % ophthalmic emulsion Place 1 drop into both eyes 2 (two) times daily. 01/14/2023: Reports using as needed   RHOPRESSA 0.02 % SOLN Place 1 drop into both eyes at bedtime.    simvastatin (ZOCOR) 20 MG tablet TAKE 1 TABLET DAILY AT 6PM.    tamsulosin (FLOMAX) 0.4 MG CAPS capsule Take 0.4 mg by mouth daily.    Vibegron (GEMTESA) 75 MG TABS Take 75 tablets by mouth daily.    alfuzosin (UROXATRAL) 10 MG 24 hr tablet Take 10 mg by mouth at bedtime. (Patient not taking: Reported on 01/14/2023)    aspirin EC 81 MG tablet Take 81 mg by mouth at bedtime. Swallow whole. (Patient not taking: Reported on 01/14/2023)    B-D UF III MINI PEN NEEDLES 31G X 5 MM MISC USE AS DIRECTED TWICE A DAY    blood glucose meter kit and supplies 1 each by Other route 3 (three) times daily. 06/24/2022: One Touch Verio Flex   glucose blood test strip Use as instructed to check his blood sugars 3 times a day 06/24/2022: One touch verio flex   Lancets (ONETOUCH ULTRASOFT) lancets Use as instructed to check his blood sugars 3 times a day    No facility-administered encounter medications on file as of 01/14/2023.

## 2023-01-19 ENCOUNTER — Other Ambulatory Visit: Payer: Self-pay

## 2023-01-23 DIAGNOSIS — E785 Hyperlipidemia, unspecified: Secondary | ICD-10-CM | POA: Diagnosis not present

## 2023-01-23 DIAGNOSIS — E559 Vitamin D deficiency, unspecified: Secondary | ICD-10-CM | POA: Diagnosis not present

## 2023-01-23 DIAGNOSIS — E1165 Type 2 diabetes mellitus with hyperglycemia: Secondary | ICD-10-CM | POA: Diagnosis not present

## 2023-01-27 ENCOUNTER — Ambulatory Visit: Payer: Medicare Other | Admitting: Emergency Medicine

## 2023-01-27 ENCOUNTER — Encounter: Payer: Self-pay | Admitting: Emergency Medicine

## 2023-01-27 VITALS — BP 118/68 | HR 67 | Temp 98.1°F | Ht 71.5 in | Wt 183.0 lb

## 2023-01-27 DIAGNOSIS — N320 Bladder-neck obstruction: Secondary | ICD-10-CM

## 2023-01-27 DIAGNOSIS — C61 Malignant neoplasm of prostate: Secondary | ICD-10-CM

## 2023-01-27 DIAGNOSIS — I700 Atherosclerosis of aorta: Secondary | ICD-10-CM

## 2023-01-27 DIAGNOSIS — J410 Simple chronic bronchitis: Secondary | ICD-10-CM

## 2023-01-27 DIAGNOSIS — R0981 Nasal congestion: Secondary | ICD-10-CM

## 2023-01-27 DIAGNOSIS — R0982 Postnasal drip: Secondary | ICD-10-CM | POA: Diagnosis not present

## 2023-01-27 DIAGNOSIS — E785 Hyperlipidemia, unspecified: Secondary | ICD-10-CM | POA: Diagnosis not present

## 2023-01-27 DIAGNOSIS — E1169 Type 2 diabetes mellitus with other specified complication: Secondary | ICD-10-CM | POA: Diagnosis not present

## 2023-01-27 DIAGNOSIS — R059 Cough, unspecified: Secondary | ICD-10-CM

## 2023-01-27 LAB — POCT GLYCOSYLATED HEMOGLOBIN (HGB A1C): Hemoglobin A1C: 5.7 % — AB (ref 4.0–5.6)

## 2023-01-27 MED ORDER — CETIRIZINE HCL 10 MG PO TABS: 10.0000 mg | ORAL_TABLET | Freq: Every day | ORAL | 3 refills | Status: DC

## 2023-01-27 MED ORDER — ALBUTEROL SULFATE HFA 108 (90 BASE) MCG/ACT IN AERS: INHALATION_SPRAY | RESPIRATORY_TRACT | 3 refills | Status: DC

## 2023-01-27 NOTE — Assessment & Plan Note (Signed)
Stable and well controlled. 

## 2023-01-27 NOTE — Assessment & Plan Note (Signed)
Well-controlled diabetes with hemoglobin A1c of 5.7 At this point I recommend to stop metformin, continue twice a day Humalog insulin 15 units and daily Jardiance 10 mg Continue simvastatin 20 mg daily Diet and nutrition discussed

## 2023-01-27 NOTE — Assessment & Plan Note (Signed)
Secondary to prostate cancer Urinary symptoms well-controlled on tamsulosin 0.4 mg and Gemtesa 75 mg daily

## 2023-01-27 NOTE — Progress Notes (Signed)
Douglas [name] 87 y.o.   Chief Complaint  Patient presents with   Medical Management of Chronic Issues    f/u app, patient states he has a cough that has lasted about a month 1/2 , dry cough , patient states his throat is dry and scratchy at times     HISTORY OF PRESENT ILLNESS: This is a 87 y.o. male here for 68-month follow-up of multiple chronic medical conditions including diabetes Presently on twice daily insulin, Jardiance and metformin Overall doing well.  Accompanied by wife today. Occasional dry cough with occasional scratchy throat.  No fever or any other associated symptoms No other complaints or medical concerns today.  HPI   Prior to Admission medications   Medication Sig Start Date End Date Taking? Authorizing Provider  acetaminophen (TYLENOL) 500 MG tablet Take 500-1,000 mg by mouth every 6 (six) hours as needed for mild pain or headache.   Yes [provider]  B-D UF III MINI PEN NEEDLES 31G X 5 MM MISC USE AS DIRECTED TWICE A DAY 11/18/22  Yes Bernardo Brayman, Eilleen Kempf, MD  blood glucose meter kit and supplies 1 each by Other route 3 (three) times daily. 05/09/22  Yes Burnadette Pop, MD  brimonidine (ALPHAGAN) 0.2 % ophthalmic solution Place 1 drop into the left eye daily.   Yes [provider]  Cholecalciferol 125 MCG (5000 UT) TABS Take 1 tablet by mouth daily.   Yes [provider]  dorzolamide-timolol (COSOPT) 2-0.5 % ophthalmic solution Place 1 drop into both eyes 2 (two) times daily.   Yes [provider]  Glucagon (GVOKE HYPOPEN 2-PACK) 0.5 MG/0.1ML SOAJ Inject 0.5 mg into the skin as needed. 05/12/22  Yes Georgina Quint, MD  glucose blood test strip Use as instructed to check his blood sugars 3 times a day 05/12/22  Yes Leda Bellefeuille, Eilleen Kempf, MD  Insulin Lispro Prot & Lispro (HUMALOG 75/25 MIX) (75-25) 100 UNIT/ML Kwikpen Inject 15 Units into the skin 2 (two) times daily. 08/11/22  Yes Zackry Deines, Eilleen Kempf, MD  JARDIANCE  10 MG TABS tablet TAKE 1 TABLET BY MOUTH DAILY BEFORE BREAKFAST. 09/10/22  Yes Deandrea Rion, Eilleen Kempf, MD  Lancets [facility] ULTRASOFT) lancets Use as instructed to check his blood sugars 3 times a day 05/12/22  Yes Tanaya Dunigan, Eilleen Kempf, MD  latanoprost (XALATAN) 0.005 % ophthalmic solution Place 1 drop into both eyes at bedtime. 07/14/19  Yes [provider]  linaclotide Karlene Einstein) 72 MCG capsule Take 1 capsule (72 mcg total) by mouth daily before breakfast. Patient not taking: Reported on 01/14/2023 07/15/22   Georgina Quint, MD  metFORMIN (GLUCOPHAGE) 500 MG tablet Take 1 tablet (500 mg total) by mouth 2 (two) times daily with a meal. 05/12/22  Yes Jennavecia Schwier, Eilleen Kempf, MD  Multiple Vitamin (MULTIVITAMIN) tablet Take 1 tablet by mouth at bedtime.   Yes [provider]  RESTASIS 0.05 % ophthalmic emulsion Place 1 drop into both eyes 2 (two) times daily. 07/13/17  Yes [provider]  RHOPRESSA 0.02 % SOLN Place 1 drop into both eyes at bedtime.   Yes [provider]  simvastatin (ZOCOR) 20 MG tablet TAKE 1 TABLET DAILY AT 6PM. 01/03/23  Yes Laker Thompson, Eilleen Kempf, MD  tamsulosin (FLOMAX) 0.4 MG CAPS capsule Take 0.4 mg by mouth daily.   Yes [provider]  Vibegron (GEMTESA) 75 MG TABS Take 75 tablets by mouth daily.   Yes [provider]  albuterol (VENTOLIN HFA) 108 (90 Base) MCG/ACT inhaler TAKE  2 PUFFS BY MOUTH EVERY 6 HOURS AS NEEDED FOR WHEEZE OR SHORTNESS OF BREATH Strength: 108 (90 Base) MCG/ACT 01/27/23   Georgina Quint, MD  alfuzosin (UROXATRAL) 10 MG 24 hr tablet Take 10 mg by mouth at bedtime. Patient not taking: Reported on 01/14/2023    [provider]  aspirin EC 81 MG tablet Take 81 mg by mouth at bedtime. Swallow whole. Patient not taking: Reported on 01/14/2023    [provider]  cetirizine (ZYRTEC) 10 MG tablet Take 1 tablet (10 mg total) by mouth daily. 01/27/23   Georgina Quint, MD   pantoprazole (PROTONIX) 40 MG tablet TAKE 1 TABLET BY MOUTH EVERY DAY Patient not taking: Reported on 01/27/2023 08/31/22   Georgina Quint, MD    No Known Allergies  Patient Active Problem List   Diagnosis Date Noted   Orthostasis 06/03/2022   Dyslipidemia associated with type 2 diabetes mellitus (HCC) 05/12/2022   Hyperglycemia 05/08/2022   Uncontrolled type 2 diabetes mellitus with hyperglycemia (HCC) 05/07/2022   Glaucoma 05/07/2022   Neuropathic pain of both feet 11/05/2021   Decreased hearing of left ear 11/05/2021   Simple chronic bronchitis (HCC) 06/18/2021   Diet-controlled diabetes mellitus (HCC) 09/20/2020   History of prostate cancer 09/20/2020   Atherosclerosis of aorta (HCC) 09/20/2020   B12 deficiency 12/16/2016   Bladder outlet obstruction 04/25/2015   Erectile dysfunction due to arterial insufficiency 04/25/2015   Osteoporosis 04/25/2015   Prostate cancer (HCC) 05/30/2011   Dyslipidemia 05/30/2011   Hypogonadism male 05/30/2011    Past Medical History:  Diagnosis Date   Adenomatous colon polyp 03/2001   Allergy    SEASONAL   Cancer (HCC)    Cataract    Dizziness    GERD (gastroesophageal reflux disease)    Hearing loss    Hyperlipidemia    Low testosterone     Past Surgical History:  Procedure Laterality Date   APPENDECTOMY     COLONOSCOPY     EYE SURGERY     POLYPECTOMY     PROSTATE SURGERY     SPINE SURGERY      Social History   Socioeconomic History   Marital status: Married    Spouse name: Not on file   Number of children: 3   Years of education: 2 years [employer]   Highest education level: Not on file  Occupational History   Occupation: RETIRED  Tobacco Use   Smoking status: Never   Smokeless tobacco: Never  Vaping Use   Vaping status: Never Used  Substance and Sexual Activity   Alcohol use: No    Alcohol/week: 0.0 standard drinks of alcohol   Drug use: No   Sexual activity: Yes  Other Topics Concern   Not on file  Social  History Narrative   Married. Education: [employer]. Exercise: Yes.   Right-handed.   Drinks coffee daily.   Lives at home with his wife.   Social Determinants of Health   Financial Resource Strain: Low Risk  (07/15/2022)   Overall Financial Resource Strain (CARDIA)    Difficulty of Paying Living Expenses: Not hard at all  Food Insecurity: No Food Insecurity (07/15/2022)   Hunger Vital Sign    Worried About Running Out of Food in the Last Year: Never true    Ran Out of Food in the Last Year: Never true  Transportation Needs: No Transportation Needs (07/15/2022)   PRAPARE - Transportation    Lack of Transportation (Medical): No    Lack of  Transportation (Non-Medical): No  Physical Activity: Insufficiently Active (07/15/2022)   Exercise Vital Sign    Days of Exercise per Week: 3 days    Minutes of Exercise per Session: 40 min  Stress: No Stress Concern Present (07/12/2021)   [employer] of Occupational Health - Occupational Stress Questionnaire    Feeling of Stress : Not at all  Social Connections: Moderately Isolated (07/15/2022)   Social Connection and Isolation Panel [NHANES]    Frequency of Communication with Friends and Family: Once a week    Frequency of Social Gatherings with Friends and Family: Once a week    Attends Religious Services: Never    Database administrator or Organizations: No    Attends Engineer, structural: More than 4 times per year    Marital Status: Married  Catering manager Violence: Not At Risk (07/15/2022)   Humiliation, Afraid, Rape, and Kick questionnaire    Fear of Current or Ex-Partner: No    Emotionally Abused: No    Physically Abused: No    Sexually Abused: No    Family History  Problem Relation Age of Onset   Cancer Father 68       pancreatic   Other Mother        age 36   Prostate cancer Brother    Colon cancer Neg Hx    Esophageal cancer Neg Hx    Liver cancer Neg Hx    Pancreatic cancer Neg Hx    Rectal cancer Neg Hx     Stomach cancer Neg Hx      Review of Systems  Constitutional: Negative.  Negative for chills and fever.  [name]: Negative.  Negative for congestion and sore throat.   Respiratory: Negative.  Negative for cough.   Cardiovascular: Negative.  Negative for chest pain and palpitations.  Gastrointestinal:  Negative for nausea and vomiting.  Genitourinary: Negative.  Negative for dysuria and hematuria.  Skin: Negative.  Negative for rash.  Neurological: Negative.  Negative for dizziness and headaches.  All other systems reviewed and are negative.   Vitals:   01/27/23 1019  BP: 118/68  Pulse: 67  Temp: 98.1 F (36.7 C)  SpO2: 96%    Physical Exam Constitutional:      Appearance: Normal appearance.  [name]:     Head: Normocephalic.     Mouth/Throat:     Mouth: Mucous membranes are moist.     Pharynx: Oropharynx is clear.  Eyes:     Extraocular Movements: Extraocular movements intact.     Pupils: Pupils are equal, round, and reactive to light.  Cardiovascular:     Rate and Rhythm: Normal rate and regular rhythm.     Pulses: Normal pulses.     Heart sounds: Normal heart sounds.  Pulmonary:     Effort: Pulmonary effort is normal.     Breath sounds: Normal breath sounds.  Musculoskeletal:     Cervical back: No tenderness.  Lymphadenopathy:     Cervical: No cervical adenopathy.  Skin:    General: Skin is warm and dry.  Neurological:     General: No focal deficit present.     Mental Status: He is alert and oriented to person, place, and time.  Psychiatric:        Mood and Affect: Mood normal.        Behavior: Behavior normal.    Results for orders placed or performed in visit on 01/27/23 (from the past 24 hour(s))  POCT HgB A1C  Status: Abnormal   Collection Time: 01/27/23 10:52 AM  Result Value Ref Range   Hemoglobin A1C 5.7 (A) 4.0 - 5.6 %   HbA1c POC (<> result, manual entry)     HbA1c, POC (prediabetic range)     HbA1c, POC (controlled diabetic range)        ASSESSMENT & PLAN: A total of 43 minutes was spent with the patient and counseling/coordination of care regarding preparing for this visit, review of most recent office visit notes, review of multiple chronic medical conditions under management, cardiovascular risks associated with diabetes, review of all medications and changes made, review of most recent blood work results including interpretation of today's hemoglobin A1c, education and nutrition, prognosis, documentation, and need for follow-up  Problem List Items Addressed This Visit       Cardiovascular and Mediastinum   Atherosclerosis of aorta (HCC)    Continue simvastatin 20 mg daily        Respiratory   Simple chronic bronchitis (HCC)    Creating intermittent dry cough Continue albuterol inhaler as needed        Endocrine   Dyslipidemia associated with type 2 diabetes mellitus (HCC) - Primary    Well-controlled diabetes with hemoglobin A1c of 5.7 At this point I recommend to stop metformin, continue twice a day Humalog insulin 15 units and daily Jardiance 10 mg Continue simvastatin 20 mg daily Diet and nutrition discussed      Relevant Orders   POCT HgB A1C (Completed)     Genitourinary   Prostate cancer (HCC)    Stable and well-controlled.      Bladder outlet obstruction    Secondary to prostate cancer Urinary symptoms well-controlled on tamsulosin 0.4 mg and Gemtesa 75 mg daily      Other Visit Diagnoses     PND (post-nasal drip)       Relevant Medications   cetirizine (ZYRTEC) 10 MG tablet   Nasal congestion       Relevant Medications   cetirizine (ZYRTEC) 10 MG tablet   Cough       Relevant Medications   albuterol (VENTOLIN HFA) 108 (90 Base) MCG/ACT inhaler   albuterol (VENTOLIN HFA) 108 (90 Base) MCG/ACT inhaler      Patient Instructions  Stop metformin. Continue all other medications.  Health Maintenance After Age 73 After age 60, you are at a higher risk for certain long-term  diseases and infections as well as injuries from falls. Falls are a major cause of broken bones and head injuries in people who are older than age 51. Getting regular preventive care can help to keep you healthy and well. Preventive care includes getting regular testing and making lifestyle changes as recommended by your health care provider. Talk with your health care provider about: Which screenings and tests you should have. A screening is a test that checks for a disease when you have no symptoms. A diet and exercise plan that is right for you. What should I know about screenings and tests to prevent falls? Screening and testing are the best ways to find a health problem early. Early diagnosis and treatment give you the best chance of managing medical conditions that are common after age 77. Certain conditions and lifestyle choices may make you more likely to have a fall. Your health care provider may recommend: Regular vision checks. Poor vision and conditions such as cataracts can make you more likely to have a fall. If you wear glasses, make sure to get your prescription  updated if your vision changes. Medicine review. Work with your health care provider to regularly review all of the medicines you are taking, including over-the-counter medicines. Ask your health care provider about any side effects that may make you more likely to have a fall. Tell your health care provider if any medicines that you take make you feel dizzy or sleepy. Strength and balance checks. Your health care provider may recommend certain tests to check your strength and balance while standing, walking, or changing positions. Foot health exam. Foot pain and numbness, as well as not wearing proper footwear, can make you more likely to have a fall. Screenings, including: Osteoporosis screening. Osteoporosis is a condition that causes the bones to get weaker and break more easily. Blood pressure screening. Blood pressure changes  and medicines to control blood pressure can make you feel dizzy. Depression screening. You may be more likely to have a fall if you have a fear of falling, feel depressed, or feel unable to do activities that you used to do. Alcohol use screening. Using too much alcohol can affect your balance and may make you more likely to have a fall. Follow these instructions at home: Lifestyle Do not drink alcohol if: Your health care provider tells you not to drink. If you drink alcohol: Limit how much you have to: 0-1 drink a day for women. 0-2 drinks a day for men. Know how much alcohol is in your drink. In the [place] one drink equals one 12 oz bottle of beer (355 mL), one 5 oz glass of wine (148 mL), or one 1 oz glass of hard liquor (44 mL). Do not use any products that contain nicotine or tobacco. These products include cigarettes, chewing tobacco, and vaping devices, such as e-cigarettes. If you need help quitting, ask your health care provider. Activity  Follow a regular exercise program to stay fit. This will help you maintain your balance. Ask your health care provider what types of exercise are appropriate for you. If you need a cane or walker, use it as recommended by your health care provider. Wear supportive shoes that have nonskid soles. Safety  Remove any tripping hazards, such as rugs, cords, and clutter. Install safety equipment such as grab bars in bathrooms and safety rails on stairs. Keep rooms and walkways well-lit. General instructions Talk with your health care provider about your risks for falling. Tell your health care provider if: You fall. Be sure to tell your health care provider about all falls, even ones that seem minor. You feel dizzy, tiredness (fatigue), or off-balance. Take over-the-counter and prescription medicines only as told by your health care provider. These include supplements. Eat a healthy diet and maintain a healthy weight. A healthy diet includes low-fat  dairy products, low-fat (lean) meats, and fiber from whole grains, beans, and lots of fruits and vegetables. Stay current with your vaccines. Schedule regular health, dental, and eye exams. Summary Having a healthy lifestyle and getting preventive care can help to protect your health and wellness after age 36. Screening and testing are the best way to find a health problem early and help you avoid having a fall. Early diagnosis and treatment give you the best chance for managing medical conditions that are more common for people who are older than age 69. Falls are a major cause of broken bones and head injuries in people who are older than age 61. Take precautions to prevent a fall at home. Work with your health care provider to learn  what changes you can make to improve your health and wellness and to prevent falls. This information is not intended to replace advice given to you by your health care provider. Make sure you discuss any questions you have with your health care provider. Document Revised: 08/06/2020 Document Reviewed: 08/06/2020 Elsevier Patient Education  2024 Elsevier Inc.     Edwina Barth, MD [facility]

## 2023-01-27 NOTE — Assessment & Plan Note (Signed)
Creating intermittent dry cough Continue albuterol inhaler as needed

## 2023-01-27 NOTE — Assessment & Plan Note (Signed)
 Continue simvastatin 20 mg daily.

## 2023-01-27 NOTE — Patient Instructions (Signed)
Stop metformin. Continue all other medications.  Health Maintenance After Age 85 After age 90, you are at a higher risk for certain long-term diseases and infections as well as injuries from falls. Falls are a major cause of broken bones and head injuries in people who are older than age 59. Getting regular preventive care can help to keep you healthy and well. Preventive care includes getting regular testing and making lifestyle changes as recommended by your health care provider. Talk with your health care provider about: Which screenings and tests you should have. A screening is a test that checks for a disease when you have no symptoms. A diet and exercise plan that is right for you. What should I know about screenings and tests to prevent falls? Screening and testing are the best ways to find a health problem early. Early diagnosis and treatment give you the best chance of managing medical conditions that are common after age 8. Certain conditions and lifestyle choices may make you more likely to have a fall. Your health care provider may recommend: Regular vision checks. Poor vision and conditions such as cataracts can make you more likely to have a fall. If you wear glasses, make sure to get your prescription updated if your vision changes. Medicine review. Work with your health care provider to regularly review all of the medicines you are taking, including over-the-counter medicines. Ask your health care provider about any side effects that may make you more likely to have a fall. Tell your health care provider if any medicines that you take make you feel dizzy or sleepy. Strength and balance checks. Your health care provider may recommend certain tests to check your strength and balance while standing, walking, or changing positions. Foot health exam. Foot pain and numbness, as well as not wearing proper footwear, can make you more likely to have a fall. Screenings, including: Osteoporosis  screening. Osteoporosis is a condition that causes the bones to get weaker and break more easily. Blood pressure screening. Blood pressure changes and medicines to control blood pressure can make you feel dizzy. Depression screening. You may be more likely to have a fall if you have a fear of falling, feel depressed, or feel unable to do activities that you used to do. Alcohol use screening. Using too much alcohol can affect your balance and may make you more likely to have a fall. Follow these instructions at home: Lifestyle Do not drink alcohol if: Your health care provider tells you not to drink. If you drink alcohol: Limit how much you have to: 0-1 drink a day for women. 0-2 drinks a day for men. Know how much alcohol is in your drink. In the [place] one drink equals one 12 oz bottle of beer (355 mL), one 5 oz glass of wine (148 mL), or one 1 oz glass of hard liquor (44 mL). Do not use any products that contain nicotine or tobacco. These products include cigarettes, chewing tobacco, and vaping devices, such as e-cigarettes. If you need help quitting, ask your health care provider. Activity  Follow a regular exercise program to stay fit. This will help you maintain your balance. Ask your health care provider what types of exercise are appropriate for you. If you need a cane or walker, use it as recommended by your health care provider. Wear supportive shoes that have nonskid soles. Safety  Remove any tripping hazards, such as rugs, cords, and clutter. Install safety equipment such as grab bars in bathrooms and safety  rails on stairs. Keep rooms and walkways well-lit. General instructions Talk with your health care provider about your risks for falling. Tell your health care provider if: You fall. Be sure to tell your health care provider about all falls, even ones that seem minor. You feel dizzy, tiredness (fatigue), or off-balance. Take over-the-counter and prescription medicines only  as told by your health care provider. These include supplements. Eat a healthy diet and maintain a healthy weight. A healthy diet includes low-fat dairy products, low-fat (lean) meats, and fiber from whole grains, beans, and lots of fruits and vegetables. Stay current with your vaccines. Schedule regular health, dental, and eye exams. Summary Having a healthy lifestyle and getting preventive care can help to protect your health and wellness after age 72. Screening and testing are the best way to find a health problem early and help you avoid having a fall. Early diagnosis and treatment give you the best chance for managing medical conditions that are more common for people who are older than age 42. Falls are a major cause of broken bones and head injuries in people who are older than age 60. Take precautions to prevent a fall at home. Work with your health care provider to learn what changes you can make to improve your health and wellness and to prevent falls. This information is not intended to replace advice given to you by your health care provider. Make sure you discuss any questions you have with your health care provider. Document Revised: 08/06/2020 Document Reviewed: 08/06/2020 Elsevier Patient Education  2024 [employer].

## 2023-01-30 DIAGNOSIS — E559 Vitamin D deficiency, unspecified: Secondary | ICD-10-CM | POA: Diagnosis not present

## 2023-01-30 DIAGNOSIS — Z794 Long term (current) use of insulin: Secondary | ICD-10-CM | POA: Diagnosis not present

## 2023-01-30 DIAGNOSIS — E1165 Type 2 diabetes mellitus with hyperglycemia: Secondary | ICD-10-CM | POA: Diagnosis not present

## 2023-01-30 DIAGNOSIS — E785 Hyperlipidemia, unspecified: Secondary | ICD-10-CM | POA: Diagnosis not present

## 2023-01-30 DIAGNOSIS — G629 Polyneuropathy, unspecified: Secondary | ICD-10-CM | POA: Diagnosis not present

## 2023-02-15 ENCOUNTER — Other Ambulatory Visit: Payer: Self-pay | Admitting: Emergency Medicine

## 2023-02-19 ENCOUNTER — Encounter: Payer: Self-pay | Admitting: Emergency Medicine

## 2023-02-19 ENCOUNTER — Ambulatory Visit: Payer: Medicare Other

## 2023-02-19 ENCOUNTER — Ambulatory Visit: Payer: Medicare Other | Admitting: Emergency Medicine

## 2023-02-19 VITALS — BP 118/60 | HR 67 | Temp 98.1°F | Ht 71.5 in | Wt 180.0 lb

## 2023-02-19 DIAGNOSIS — R053 Chronic cough: Secondary | ICD-10-CM | POA: Insufficient documentation

## 2023-02-19 DIAGNOSIS — J410 Simple chronic bronchitis: Secondary | ICD-10-CM

## 2023-02-19 DIAGNOSIS — R059 Cough, unspecified: Secondary | ICD-10-CM | POA: Diagnosis not present

## 2023-02-19 DIAGNOSIS — R918 Other nonspecific abnormal finding of lung field: Secondary | ICD-10-CM | POA: Diagnosis not present

## 2023-02-19 MED ORDER — BREZTRI AEROSPHERE 160-9-4.8 MCG/ACT IN AERO: 2.0000 | INHALATION_SPRAY | Freq: Two times a day (BID) | RESPIRATORY_TRACT | 11 refills | Status: DC

## 2023-02-19 MED ORDER — INSULIN LISPRO PROT & LISPRO (75-25 MIX) 100 UNIT/ML KWIKPEN: 15.0000 [IU] | PEN_INJECTOR | Freq: Two times a day (BID) | SUBCUTANEOUS | 0 refills | Status: DC

## 2023-02-19 MED ORDER — FAMOTIDINE 40 MG PO TABS: 40.0000 mg | ORAL_TABLET | Freq: Every day | ORAL | 1 refills | Status: DC

## 2023-02-19 NOTE — Progress Notes (Signed)
Douglas [name] 87 y.o.   Chief Complaint  Patient presents with   Cough    Patient states having dry for 2-3 months and states the albuterol is not helping.     HISTORY OF PRESENT ILLNESS: This is a 87 y.o. male complaining of chronic dry cough for the past 2 to 3 months Denies difficulty breathing or chest pain.  Non-smoker. No history of GERD.  Denies heartburn. Coughing spells mostly at night.    Cough Pertinent negatives include no chest pain, chills, fever, headaches, heartburn, rash, sore throat, shortness of breath or wheezing.     Prior to Admission medications   Medication Sig Start Date End Date Taking? Authorizing Provider  acetaminophen (TYLENOL) 500 MG tablet Take 500-1,000 mg by mouth every 6 (six) hours as needed for mild pain or headache.   Yes [provider]  albuterol (VENTOLIN HFA) 108 (90 Base) MCG/ACT inhaler TAKE 2 PUFFS BY MOUTH EVERY 6 HOURS AS NEEDED FOR WHEEZE OR SHORTNESS OF BREATH Strength: 108 (90 Base) MCG/ACT 01/27/23  Yes Donavon Kimrey, Eilleen Kempf, MD  B-D UF III MINI PEN NEEDLES 31G X 5 MM MISC USE AS DIRECTED TWICE A DAY 11/18/22  Yes Arben Packman, Eilleen Kempf, MD  blood glucose meter kit and supplies 1 each by Other route 3 (three) times daily. 05/09/22  Yes Burnadette Pop, MD  brimonidine (ALPHAGAN) 0.2 % ophthalmic solution Place 1 drop into the left eye daily.   Yes [provider]  cetirizine (ZYRTEC) 10 MG tablet Take 1 tablet (10 mg total) by mouth daily. 01/27/23  Yes Myonna Chisom, Eilleen Kempf, MD  dorzolamide-timolol (COSOPT) 2-0.5 % ophthalmic solution Place 1 drop into both eyes 2 (two) times daily.   Yes [provider]  Glucagon (GVOKE HYPOPEN 2-PACK) 0.5 MG/0.1ML SOAJ Inject 0.5 mg into the skin as needed. 05/12/22  Yes October Peery, Eilleen Kempf, MD  glucose blood test strip Use as instructed to check his blood sugars 3 times a day 05/12/22  Yes Hadar Elgersma, Eilleen Kempf, MD  JARDIANCE 10 MG TABS tablet TAKE 1 TABLET BY MOUTH DAILY  BEFORE BREAKFAST. 09/10/22  Yes Paislea Hatton, Eilleen Kempf, MD  Lancets [facility] ULTRASOFT) lancets Use as instructed to check his blood sugars 3 times a day 05/12/22  Yes Casimira Sutphin, Eilleen Kempf, MD  linaclotide [facility]) 72 MCG capsule Take 1 capsule (72 mcg total) by mouth daily before breakfast. Patient not taking: Reported on 01/14/2023 07/15/22   Georgina Quint, MD  pantoprazole (PROTONIX) 40 MG tablet TAKE 1 TABLET BY MOUTH EVERY DAY 08/31/22  Yes Kyndahl Jablon, Eilleen Kempf, MD  RESTASIS 0.05 % ophthalmic emulsion Place 1 drop into both eyes 2 (two) times daily. 07/13/17  Yes [provider]  RHOPRESSA 0.02 % SOLN Place 1 drop into both eyes at bedtime.   Yes [provider]  simvastatin (ZOCOR) 20 MG tablet TAKE 1 TABLET DAILY AT 6PM. 01/03/23  Yes Sherif Millspaugh, Eilleen Kempf, MD  tamsulosin (FLOMAX) 0.4 MG CAPS capsule Take 0.4 mg by mouth daily.   Yes [provider]  Vibegron (GEMTESA) 75 MG TABS Take 75 tablets by mouth daily.   Yes [provider]  albuterol (VENTOLIN HFA) 108 (90 Base) MCG/ACT inhaler TAKE 2 PUFFS BY MOUTH EVERY 6 HOURS AS NEEDED FOR WHEEZE OR SHORTNESS OF BREATH Strength: 108 (90 Base) MCG/ACT Patient not taking: Reported on 02/19/2023 01/27/23   Georgina Quint, MD  alfuzosin (UROXATRAL) 10 MG 24 hr tablet Take 10 mg by mouth at bedtime. Patient not taking: Reported  on 01/14/2023    [provider]  aspirin EC 81 MG tablet Take 81 mg by mouth at bedtime. Swallow whole. Patient not taking: Reported on 01/14/2023    [provider]  Cholecalciferol 125 MCG (5000 UT) TABS Take 1 tablet by mouth daily. Patient not taking: Reported on 02/19/2023    [provider]  Insulin Lispro Prot & Lispro (HUMALOG 75/25 MIX) (75-25) 100 UNIT/ML Kwikpen Inject 15 Units into the skin 2 (two) times daily. 02/19/23   Georgina Quint, MD  latanoprost (XALATAN) 0.005 % ophthalmic solution Place 1 drop into both eyes at  bedtime. Patient not taking: Reported on 02/19/2023 07/14/19   [provider]  metFORMIN (GLUCOPHAGE) 500 MG tablet Take 1 tablet (500 mg total) by mouth 2 (two) times daily with a meal. Patient not taking: Reported on 02/19/2023 05/12/22   Georgina Quint, MD  Multiple Vitamin (MULTIVITAMIN) tablet Take 1 tablet by mouth at bedtime. Patient not taking: Reported on 02/19/2023    [provider]  venlafaxine (EFFEXOR) 37.5 MG tablet TAKE 1 TABLET BY MOUTH EVERY DAY Patient not taking: Reported on 02/19/2023 02/15/23   Georgina Quint, MD    No Known Allergies  Patient Active Problem List   Diagnosis Date Noted   Orthostasis 06/03/2022   Dyslipidemia associated with type 2 diabetes mellitus (HCC) 05/12/2022   Hyperglycemia 05/08/2022   Uncontrolled type 2 diabetes mellitus with hyperglycemia (HCC) 05/07/2022   Glaucoma 05/07/2022   Neuropathic pain of both feet 11/05/2021   Decreased hearing of left ear 11/05/2021   Simple chronic bronchitis (HCC) 06/18/2021   Diet-controlled diabetes mellitus (HCC) 09/20/2020   History of prostate cancer 09/20/2020   Atherosclerosis of aorta (HCC) 09/20/2020   B12 deficiency 12/16/2016   Bladder outlet obstruction 04/25/2015   Erectile dysfunction due to arterial insufficiency 04/25/2015   Osteoporosis 04/25/2015   Prostate cancer (HCC) 05/30/2011   Dyslipidemia 05/30/2011   Hypogonadism male 05/30/2011    Past Medical History:  Diagnosis Date   Adenomatous colon polyp 03/2001   Allergy    SEASONAL   Cancer (HCC)    Cataract    Dizziness    GERD (gastroesophageal reflux disease)    Hearing loss    Hyperlipidemia    Low testosterone     Past Surgical History:  Procedure Laterality Date   APPENDECTOMY     COLONOSCOPY     EYE SURGERY     POLYPECTOMY     PROSTATE SURGERY     SPINE SURGERY      Social History   Socioeconomic History   Marital status: Married    Spouse name: Not on file   Number of  children: 3   Years of education: 2 years [employer]   Highest education level: Not on file  Occupational History   Occupation: RETIRED  Tobacco Use   Smoking status: Never   Smokeless tobacco: Never  Vaping Use   Vaping status: Never Used  Substance and Sexual Activity   Alcohol use: No    Alcohol/week: 0.0 standard drinks of alcohol   Drug use: No   Sexual activity: Yes  Other Topics Concern   Not on file  Social History Narrative   Married. Education: [employer]. Exercise: Yes.   Right-handed.   Drinks coffee daily.   Lives at home with his wife.   Social Determinants of Health   Financial Resource Strain: Low Risk  (07/15/2022)   Overall Financial Resource Strain (CARDIA)    Difficulty of  Paying Living Expenses: Not hard at all  Food Insecurity: No Food Insecurity (07/15/2022)   Hunger Vital Sign    Worried About Running Out of Food in the Last Year: Never true    Ran Out of Food in the Last Year: Never true  Transportation Needs: No Transportation Needs (07/15/2022)   PRAPARE - Administrator, Civil Service (Medical): No    Lack of Transportation (Non-Medical): No  Physical Activity: Insufficiently Active (07/15/2022)   Exercise Vital Sign    Days of Exercise per Week: 3 days    Minutes of Exercise per Session: 40 min  Stress: No Stress Concern Present (07/12/2021)   [employer] of Occupational Health - Occupational Stress Questionnaire    Feeling of Stress : Not at all  Social Connections: Moderately Isolated (07/15/2022)   Social Connection and Isolation Panel [NHANES]    Frequency of Communication with Friends and Family: Once a week    Frequency of Social Gatherings with Friends and Family: Once a week    Attends Religious Services: Never    Database administrator or Organizations: No    Attends Engineer, structural: More than 4 times per year    Marital Status: Married  Catering manager Violence: Not At Risk (07/15/2022)   Humiliation, Afraid,  Rape, and Kick questionnaire    Fear of Current or Ex-Partner: No    Emotionally Abused: No    Physically Abused: No    Sexually Abused: No    Family History  Problem Relation Age of Onset   Cancer Father 88       pancreatic   Other Mother        age 36   Prostate cancer Brother    Colon cancer Neg Hx    Esophageal cancer Neg Hx    Liver cancer Neg Hx    Pancreatic cancer Neg Hx    Rectal cancer Neg Hx    Stomach cancer Neg Hx      Review of Systems  Constitutional: Negative.  Negative for chills and fever.  [name]: Negative.  Negative for congestion and sore throat.   Respiratory:  Positive for cough. Negative for sputum production, shortness of breath and wheezing.   Cardiovascular:  Negative for chest pain and palpitations.  Gastrointestinal:  Negative for abdominal pain, diarrhea, heartburn, nausea and vomiting.  Genitourinary: Negative.  Negative for dysuria.  Skin: Negative.  Negative for rash.  Neurological: Negative.  Negative for dizziness and headaches.  All other systems reviewed and are negative.   Vitals:   02/19/23 1521  BP: 118/60  Pulse: 67  Temp: 98.1 F (36.7 C)  SpO2: 96%    Physical Exam Vitals reviewed.  Constitutional:      Appearance: Normal appearance.  [name]:     Head: Normocephalic.     Mouth/Throat:     Mouth: Mucous membranes are moist.     Pharynx: Oropharynx is clear.  Eyes:     Extraocular Movements: Extraocular movements intact.     Pupils: Pupils are equal, round, and reactive to light.  Cardiovascular:     Rate and Rhythm: Normal rate and regular rhythm.     Pulses: Normal pulses.     Heart sounds: Normal heart sounds.  Pulmonary:     Effort: Pulmonary effort is normal.     Breath sounds: Normal breath sounds.  Abdominal:     Palpations: Abdomen is soft.     Tenderness: There is no abdominal tenderness.  Musculoskeletal:  Cervical back: No tenderness.  Lymphadenopathy:     Cervical: No cervical adenopathy.   Skin:    General: Skin is warm and dry.     Capillary Refill: Capillary refill takes less than 2 seconds.  Neurological:     General: No focal deficit present.     Mental Status: He is alert and oriented to person, place, and time.  Psychiatric:        Mood and Affect: Mood normal.        Behavior: Behavior normal.      ASSESSMENT & PLAN: A total of 42 minutes was spent with the patient and counseling/coordination of care regarding preparing for this visit, review of most recent office visit notes, review of multiple chronic medical conditions under management, diagnosis of chronic cough from possible COPD, need for pulmonary evaluation, review of chest x-ray done today, review of all medications, prognosis, documentation and need for follow-up  Problem List Items Addressed This Visit       Respiratory   Simple chronic bronchitis (HCC)    Chest x-ray done today Non-smoker Pulmonary referral placed today Needs PFTs Albuterol not helping Recommend Breztri twice a day      Relevant Medications   Budeson-Glycopyrrol-Formoterol (BREZTRI AEROSPHERE) 160-9-4.8 MCG/ACT AERO     Other   Chronic cough - Primary    Non-smoker.  No history of COPD Chest x-ray done today.  Report reviewed. Recommend pulmonary evaluation.  Referral placed today Cough management discussed Recommend over-the-counter Mucinex DM May benefit from bronchodilator inhaler Cough worse at night.  There may be a GERD component. Recommend trial of Pepcid 40 mg at bedtime for 2 weeks      Relevant Medications   famotidine (PEPCID) 40 MG tablet   Budeson-Glycopyrrol-Formoterol (BREZTRI AEROSPHERE) 160-9-4.8 MCG/ACT AERO   Other Relevant Orders   Ambulatory referral to Pulmonology   DG Chest 2 View   Patient Instructions  Cough, Adult A cough helps to clear your throat and lungs. It may be a sign of an illness or another condition. A short-term (acute) cough may last 2-3 weeks. A long-term (chronic) cough  may last 8 or more weeks. Many things can cause a cough. They include: Illnesses such as: An infection in your throat or lungs. Asthma or other heart or lung problems. Gastroesophageal reflux. This is when acid comes back up from your stomach. Breathing in things that bother (irritate) your lungs. Allergies. Postnasal drip. This is when mucus runs down the back of your throat. Smoking. Some medicines. Follow these instructions at home: Medicines Take over-the-counter and prescription medicines only as told by your doctor. Talk with your doctor before you take cough medicine (cough suppressants). Eating and drinking Do not drink alcohol. Do not drink caffeine. Drink enough fluid to keep your pee (urine) pale yellow. Lifestyle Stay away from cigarette smoke. Do not smoke or use any products that contain nicotine or tobacco. If you need help quitting, ask your doctor. Stay away from things that make you cough. These may include perfume, candles, cleaning products, or campfire smoke. General instructions  Watch for any changes to your cough. Tell your doctor about them. Always cover your mouth when you cough. If the air is dry in your home, use a cool mist vaporizer or humidifier. If your cough is worse at night, try using extra pillows to raise your head up higher while you sleep. Rest as needed. Contact a doctor if: You have new symptoms. Your symptoms get worse. You cough up pus.  You have a fever that does not go away. Your cough does not get better after 2-3 weeks. Cough medicine does not help, and you are not sleeping well. You have pain that gets worse or is not helped with medicine. You are losing weight and do not know why. You have night sweats. Get help right away if: You cough up blood. You have trouble breathing. Your heart is beating very fast. These symptoms may be an emergency. Get help right away. Call 911. Do not wait to see if the symptoms will go away. Do  not drive yourself to the hospital. This information is not intended to replace advice given to you by your health care provider. Make sure you discuss any questions you have with your health care provider. Document Revised: 11/15/2021 Document Reviewed: 11/15/2021 Elsevier Patient Education  2024 Elsevier Inc.     Edwina Barth, MD [facility]

## 2023-02-19 NOTE — Assessment & Plan Note (Addendum)
Non-smoker.  No history of COPD Chest x-ray done today.  Report reviewed. Recommend pulmonary evaluation.  Referral placed today Cough management discussed Recommend over-the-counter Mucinex DM May benefit from bronchodilator inhaler Cough worse at night.  There may be a GERD component. Recommend trial of Pepcid 40 mg at bedtime for 2 weeks

## 2023-02-19 NOTE — Assessment & Plan Note (Signed)
Chest x-ray done today Non-smoker Pulmonary referral placed today Needs PFTs Albuterol not helping Recommend Breztri twice a day

## 2023-02-19 NOTE — Patient Instructions (Signed)

## 2023-02-25 ENCOUNTER — Other Ambulatory Visit: Payer: Self-pay | Admitting: Emergency Medicine

## 2023-02-25 DIAGNOSIS — E1165 Type 2 diabetes mellitus with hyperglycemia: Secondary | ICD-10-CM

## 2023-02-25 DIAGNOSIS — H401122 Primary open-angle glaucoma, left eye, moderate stage: Secondary | ICD-10-CM | POA: Diagnosis not present

## 2023-02-25 DIAGNOSIS — H401111 Primary open-angle glaucoma, right eye, mild stage: Secondary | ICD-10-CM | POA: Diagnosis not present

## 2023-02-26 ENCOUNTER — Other Ambulatory Visit: Payer: Self-pay | Admitting: Emergency Medicine

## 2023-02-26 DIAGNOSIS — E1165 Type 2 diabetes mellitus with hyperglycemia: Secondary | ICD-10-CM

## 2023-02-26 DIAGNOSIS — K219 Gastro-esophageal reflux disease without esophagitis: Secondary | ICD-10-CM

## 2023-03-01 ENCOUNTER — Other Ambulatory Visit: Payer: Self-pay | Admitting: Emergency Medicine

## 2023-03-09 ENCOUNTER — Ambulatory Visit (INDEPENDENT_AMBULATORY_CARE_PROVIDER_SITE_OTHER): Payer: Medicare Other | Admitting: Emergency Medicine

## 2023-03-09 ENCOUNTER — Encounter: Payer: Self-pay | Admitting: Emergency Medicine

## 2023-03-09 VITALS — BP 122/58 | HR 62 | Temp 98.7°F | Ht 71.5 in | Wt 181.2 lb

## 2023-03-09 DIAGNOSIS — E785 Hyperlipidemia, unspecified: Secondary | ICD-10-CM | POA: Diagnosis not present

## 2023-03-09 DIAGNOSIS — J410 Simple chronic bronchitis: Secondary | ICD-10-CM

## 2023-03-09 DIAGNOSIS — M62838 Other muscle spasm: Secondary | ICD-10-CM | POA: Insufficient documentation

## 2023-03-09 DIAGNOSIS — Z7984 Long term (current) use of oral hypoglycemic drugs: Secondary | ICD-10-CM

## 2023-03-09 DIAGNOSIS — I700 Atherosclerosis of aorta: Secondary | ICD-10-CM | POA: Diagnosis not present

## 2023-03-09 DIAGNOSIS — Z794 Long term (current) use of insulin: Secondary | ICD-10-CM

## 2023-03-09 DIAGNOSIS — E1169 Type 2 diabetes mellitus with other specified complication: Secondary | ICD-10-CM | POA: Diagnosis not present

## 2023-03-09 DIAGNOSIS — C61 Malignant neoplasm of prostate: Secondary | ICD-10-CM | POA: Diagnosis not present

## 2023-03-09 DIAGNOSIS — R053 Chronic cough: Secondary | ICD-10-CM

## 2023-03-09 MED ORDER — CYCLOBENZAPRINE HCL 10 MG PO TABS
10.0000 mg | ORAL_TABLET | Freq: Every day | ORAL | 0 refills | Status: DC
Start: 2023-03-09 — End: 2023-07-02

## 2023-03-09 NOTE — Assessment & Plan Note (Signed)
Well-controlled diabetes with hemoglobin A1c of 5.7 At this point I recommend to stop metformin, continue twice a day Humalog insulin 15 units and daily Jardiance 10 mg Continue simvastatin 20 mg daily Diet and nutrition discussed

## 2023-03-09 NOTE — Assessment & Plan Note (Signed)
Stable and well controlled. 

## 2023-03-09 NOTE — Assessment & Plan Note (Signed)
Chest x-ray done during last visit reviewed Hyperinflation noted Non-smoker Pulmonary referral placed during last visit.  Information given to patient so he can call and schedule appointment Needs PFTs Continue Breztri twice a day

## 2023-03-09 NOTE — Assessment & Plan Note (Signed)
 Continue simvastatin 20 mg daily.

## 2023-03-09 NOTE — Assessment & Plan Note (Signed)
Much improved. Still recommend pulmonary evaluation and PFTs Continue present medications.  No changes

## 2023-03-09 NOTE — Assessment & Plan Note (Signed)
Active, present on physical examination, and affecting quality of life Recommend Flexeril 10 mg at bedtime Recommend heat pad frequently during the day May take Tylenol as needed for pain Advised to contact the office if no better or worse during the next several days

## 2023-03-09 NOTE — Progress Notes (Signed)
[name] 87 y.o.   Chief Complaint  Patient presents with   Neck Pain    Patient states whenever he turns his neck he gets a sharp pain on the Left side that has been going on for over a month on and off and has been getting worse. Numbness to his left fingers. No blurred vision, no headache, no chest pain. patient is still hoarse from his cough    HISTORY OF PRESENT ILLNESS: This is a 87 y.o. male complaining of left-sided occasional leg pain for the last month or so. Chronic cough much improved since our last office visit. No other complaints or medical concerns today.  HPI   Prior to Admission medications   Medication Sig Start Date End Date Taking? Authorizing Provider  linaclotide (LINZESS) 72 MCG capsule Take 1 capsule (72 mcg total) by mouth daily before breakfast. Patient not taking: Reported on 01/14/2023 07/15/22   Georgina Quint, MD  acetaminophen (TYLENOL) 500 MG tablet Take 500-1,000 mg by mouth every 6 (six) hours as needed for mild pain or headache.    [provider]  albuterol (VENTOLIN HFA) 108 (90 Base) MCG/ACT inhaler TAKE 2 PUFFS BY MOUTH EVERY 6 HOURS AS NEEDED FOR WHEEZE OR SHORTNESS OF BREATH Strength: 108 (90 Base) MCG/ACT Patient not taking: Reported on 02/19/2023 01/27/23   Georgina Quint, MD  albuterol (VENTOLIN HFA) 108 (90 Base) MCG/ACT inhaler TAKE 2 PUFFS BY MOUTH EVERY 6 HOURS AS NEEDED FOR WHEEZE OR SHORTNESS OF BREATH Strength: 108 (90 Base) MCG/ACT 01/27/23   Georgina Quint, MD  alfuzosin (UROXATRAL) 10 MG 24 hr tablet Take 10 mg by mouth at bedtime. Patient not taking: Reported on 01/14/2023    [provider]  aspirin EC 81 MG tablet Take 81 mg by mouth at bedtime. Swallow whole. Patient not taking: Reported on 01/14/2023    [provider]  B-D UF III MINI PEN NEEDLES 31G X 5 MM MISC USE AS DIRECTED TWICE A DAY 03/01/23   Georgina Quint, MD  blood glucose meter kit and supplies 1 each by  Other route 3 (three) times daily. 05/09/22   Burnadette Pop, MD  brimonidine (ALPHAGAN) 0.2 % ophthalmic solution Place 1 drop into the left eye daily.    [provider]  Budeson-Glycopyrrol-Formoterol (BREZTRI AEROSPHERE) 160-9-4.8 MCG/ACT AERO Inhale 2 puffs into the lungs 2 (two) times daily. 02/19/23   Georgina Quint, MD  cetirizine (ZYRTEC) 10 MG tablet Take 1 tablet (10 mg total) by mouth daily. 01/27/23   Georgina Quint, MD  Cholecalciferol 125 MCG (5000 UT) TABS Take 1 tablet by mouth daily. Patient not taking: Reported on 02/19/2023    [provider]  dorzolamide-timolol (COSOPT) 2-0.5 % ophthalmic solution Place 1 drop into both eyes 2 (two) times daily.    [provider]  famotidine (PEPCID) 40 MG tablet Take 1 tablet (40 mg total) by mouth at bedtime for 14 days. 02/19/23 03/05/23  Georgina Quint, MD  Glucagon (GVOKE HYPOPEN 2-PACK) 0.5 MG/0.1ML SOAJ Inject 0.5 mg into the skin as needed. 05/12/22   Georgina Quint, MD  glucose blood test strip Use as instructed to check his blood sugars 3 times a day 05/12/22   Georgina Quint, MD  Insulin Lispro Prot & Lispro (HUMALOG 75/25 MIX) (75-25) 100 UNIT/ML Kwikpen Inject 15 Units into the skin 2 (two) times daily. 02/19/23   Georgina Quint, MD  JARDIANCE 10 MG TABS tablet TAKE 1 TABLET  BY MOUTH EVERY DAY BEFORE BREAKFAST 02/25/23   Georgina Quint, MD  Lancets [facility] ULTRASOFT) lancets Use as instructed to check his blood sugars 3 times a day 05/12/22   Georgina Quint, MD  latanoprost (XALATAN) 0.005 % ophthalmic solution Place 1 drop into both eyes at bedtime. Patient not taking: Reported on 02/19/2023 07/14/19   [provider]  metFORMIN (GLUCOPHAGE) 500 MG tablet TAKE 1 TABLET BY MOUTH 2 TIMES DAILY WITH A MEAL. 02/26/23   Georgina Quint, MD  Multiple Vitamin (MULTIVITAMIN) tablet Take 1 tablet by mouth at bedtime. Patient not taking: Reported  on 02/19/2023    [provider]  pantoprazole (PROTONIX) 40 MG tablet TAKE 1 TABLET BY MOUTH EVERY DAY 02/26/23   Tylynn Braniff, Eilleen Kempf, MD  RESTASIS 0.05 % ophthalmic emulsion Place 1 drop into both eyes 2 (two) times daily. 07/13/17   [provider]  RHOPRESSA 0.02 % SOLN Place 1 drop into both eyes at bedtime.    [provider]  simvastatin (ZOCOR) 20 MG tablet TAKE 1 TABLET DAILY AT 6PM. 01/03/23   Georgina Quint, MD  tamsulosin (FLOMAX) 0.4 MG CAPS capsule Take 0.4 mg by mouth daily.    [provider]  venlafaxine (EFFEXOR) 37.5 MG tablet TAKE 1 TABLET BY MOUTH EVERY DAY Patient not taking: Reported on 02/19/2023 02/15/23   Georgina Quint, MD  Vibegron [facility]) 75 MG TABS Take 75 tablets by mouth daily.    [provider]    No Known Allergies  Patient Active Problem List   Diagnosis Date Noted   Chronic cough 02/19/2023   Orthostasis 06/03/2022   Dyslipidemia associated with type 2 diabetes mellitus (HCC) 05/12/2022   Hyperglycemia 05/08/2022   Uncontrolled type 2 diabetes mellitus with hyperglycemia (HCC) 05/07/2022   Glaucoma 05/07/2022   Neuropathic pain of both feet 11/05/2021   Decreased hearing of left ear 11/05/2021   Simple chronic bronchitis (HCC) 06/18/2021   Diet-controlled diabetes mellitus (HCC) 09/20/2020   History of prostate cancer 09/20/2020   Atherosclerosis of aorta (HCC) 09/20/2020   B12 deficiency 12/16/2016   Bladder outlet obstruction 04/25/2015   Erectile dysfunction due to arterial insufficiency 04/25/2015   Osteoporosis 04/25/2015   Prostate cancer (HCC) 05/30/2011   Dyslipidemia 05/30/2011   Hypogonadism male 05/30/2011    Past Medical History:  Diagnosis Date   Adenomatous colon polyp 03/2001   Allergy    SEASONAL   Cancer (HCC)    Cataract    Dizziness    GERD (gastroesophageal reflux disease)    Hearing loss    Hyperlipidemia    Low testosterone     Past Surgical History:   Procedure Laterality Date   APPENDECTOMY     COLONOSCOPY     EYE SURGERY     POLYPECTOMY     PROSTATE SURGERY     SPINE SURGERY      Social History   Socioeconomic History   Marital status: Married    Spouse name: Not on file   Number of children: 3   Years of education: 2 years [employer]   Highest education level: Not on file  Occupational History   Occupation: RETIRED  Tobacco Use   Smoking status: Never   Smokeless tobacco: Never  Vaping Use   Vaping status: Never Used  Substance and Sexual Activity   Alcohol use: No    Alcohol/week: 0.0 standard drinks of alcohol   Drug use: No   Sexual activity: Yes  Other Topics Concern  Not on file  Social History Narrative   Married. Education: [employer]. Exercise: Yes.   Right-handed.   Drinks coffee daily.   Lives at home with his wife.   Social Determinants of Health   Financial Resource Strain: Low Risk  (07/15/2022)   Overall Financial Resource Strain (CARDIA)    Difficulty of Paying Living Expenses: Not hard at all  Food Insecurity: No Food Insecurity (07/15/2022)   Hunger Vital Sign    Worried About Running Out of Food in the Last Year: Never true    Ran Out of Food in the Last Year: Never true  Transportation Needs: No Transportation Needs (07/15/2022)   PRAPARE - Administrator, Civil Service (Medical): No    Lack of Transportation (Non-Medical): No  Physical Activity: Insufficiently Active (07/15/2022)   Exercise Vital Sign    Days of Exercise per Week: 3 days    Minutes of Exercise per Session: 40 min  Stress: No Stress Concern Present (07/12/2021)   [employer] of Occupational Health - Occupational Stress Questionnaire    Feeling of Stress : Not at all  Social Connections: Moderately Isolated (07/15/2022)   Social Connection and Isolation Panel [NHANES]    Frequency of Communication with Friends and Family: Once a week    Frequency of Social Gatherings with Friends and Family: Once a week     Attends Religious Services: Never    Database administrator or Organizations: No    Attends Engineer, structural: More than 4 times per year    Marital Status: Married  Catering manager Violence: Not At Risk (07/15/2022)   Humiliation, Afraid, Rape, and Kick questionnaire    Fear of Current or Ex-Partner: No    Emotionally Abused: No    Physically Abused: No    Sexually Abused: No    Family History  Problem Relation Age of Onset   Cancer Father 74       pancreatic   Other Mother        age 85   Prostate cancer Brother    Colon cancer Neg Hx    Esophageal cancer Neg Hx    Liver cancer Neg Hx    Pancreatic cancer Neg Hx    Rectal cancer Neg Hx    Stomach cancer Neg Hx      Review of Systems  Constitutional: Negative.  Negative for chills and fever.  [name]: Negative.  Negative for congestion and sore throat.   Respiratory: Negative.  Negative for cough and shortness of breath.   Cardiovascular: Negative.  Negative for chest pain and palpitations.  Gastrointestinal:  Negative for abdominal pain, diarrhea, nausea and vomiting.  Genitourinary: Negative.  Negative for dysuria and hematuria.  Musculoskeletal:  Positive for neck pain.  Skin: Negative.  Negative for rash.  Neurological: Negative.  Negative for dizziness and headaches.  All other systems reviewed and are negative.   Today's Vitals   03/09/23 1017  BP: (!) 122/58  Pulse: 62  Temp: 98.7 F (37.1 C)  TempSrc: Oral  SpO2: 97%  Weight: 181 lb 3.2 oz (82.2 kg)  Height: 5' 11.5" (1.816 m)   Body mass index is 24.92 kg/m.   Physical Exam Vitals reviewed.  Constitutional:      Appearance: Normal appearance.  [name]:     Head: Normocephalic.     Mouth/Throat:     Mouth: Mucous membranes are moist.     Pharynx: Oropharynx is clear.  Eyes:     Extraocular Movements:  Extraocular movements intact.     Pupils: Pupils are equal, round, and reactive to light.  Neck:     Comments: Left-sided muscle spasm  and tenderness to sternocleidomastoid area Cardiovascular:     Rate and Rhythm: Normal rate and regular rhythm.     Pulses: Normal pulses.     Heart sounds: Normal heart sounds.  Pulmonary:     Effort: Pulmonary effort is normal.     Breath sounds: Normal breath sounds.  Musculoskeletal:     Cervical back: Pain with movement and muscular tenderness present. No spinous process tenderness.  Skin:    General: Skin is warm and dry.  Neurological:     Mental Status: He is alert and oriented to person, place, and time.  Psychiatric:        Mood and Affect: Mood normal.        Behavior: Behavior normal.      ASSESSMENT & PLAN: A total of 43 minutes was spent with the patient and counseling/coordination of care regarding preparing for this visit, review of most recent office visit notes, review of multiple chronic medical conditions and their management, review of all medications, review of most recent bloodwork results, review of recent chest x-ray report, differential diagnosis of neck pain and management, review of health maintenance items, education on nutrition, prognosis, documentation, and need for follow up.   Problem List Items Addressed This Visit       Cardiovascular and Mediastinum   Atherosclerosis of aorta (HCC)    Continue simvastatin 20 mg daily        Respiratory   Simple chronic bronchitis (HCC)    Chest x-ray done during last visit reviewed Hyperinflation noted Non-smoker Pulmonary referral placed during last visit.  Information given to patient so he can call and schedule appointment Needs PFTs Continue Breztri twice a day        Endocrine   Dyslipidemia associated with type 2 diabetes mellitus (HCC)    Well-controlled diabetes with hemoglobin A1c of 5.7 At this point I recommend to stop metformin, continue twice a day Humalog insulin 15 units and daily Jardiance 10 mg Continue simvastatin 20 mg daily Diet and nutrition discussed         Musculoskeletal and Integument   Neck muscle spasm - Primary    Active, present on physical examination, and affecting quality of life Recommend Flexeril 10 mg at bedtime Recommend heat pad frequently during the day May take Tylenol as needed for pain Advised to contact the office if no better or worse during the next several days      Relevant Medications   cyclobenzaprine (FLEXERIL) 10 MG tablet     Genitourinary   Prostate cancer (HCC)    Stable and well-controlled.        Other   Dyslipidemia    Stable.  Diet and nutrition discussed.  Continue simvastatin 20 mg daily.        Chronic cough    Much improved. Still recommend pulmonary evaluation and PFTs Continue present medications.  No changes      Patient Instructions  Cervical Sprain A cervical sprain is also called a neck sprain. It is a stretch or tear in one or more ligaments in the neck. Ligaments are tissues that connect bones to each other. Neck sprains can be mild, bad, or very bad. A very bad sprain can cause problems with bones and other structures.  Most neck sprains heal in 4-6 weeks, but this depends on how bad  the injury is. What are the causes? Neck sprains may be caused by trauma, such as: An injury from a motor vehicle accident. A fall. The head and neck being moved front to back or side to side all of a sudden (whiplash injury). Mild neck sprains may be caused by wear and tear over time. What increases the risk? Some activities put you at risk of hurting your neck. These include: Contact sports. Gymnastics. Diving. Arthritis caused by wear and tear of the joints in the spine. The neck not being very strong or flexible. Having had a neck injury in the past. Poor posture. Spending a lot of time in positions that put stress on the neck. This may be from sitting at a computer for a long time. What are the signs or symptoms? Signs or symptoms include any of these problems in your neck, shoulders,  or upper back: Pain or tenderness. Stiffness. Swelling. A burning feeling. Sudden tightening of neck muscles (spasms). Not being able to move the neck very much. Headache. Feeling dizzy. Feeling like you may vomit, or vomiting. Having a hand or arm that: Feels weak. Loses feeling (feels numb). Tingles. How is this treated? This condition is treated by: Resting and putting ice or heat on your neck. Doing exercises to improve movement and strength in the injured area (physical therapy). In some cases, treatment may also include: Keeping your neck in place for a length of time. This may be done using: A neck collar. This supports your chin and the back of your head. A cervical traction device. This is a sling that holds up your head. It removes weight and pressure from your neck. Medicines for pain or other symptoms. Surgery. This is rare. Follow these instructions at home: Medicines Take over-the-counter and prescription medicines only as told by your doctor. Ask your doctor if you should avoid driving or using machines while you are taking your medicine. If told, take steps to prevent problems with pooping (constipation). You may need to: Drink enough fluid to keep your pee (urine) pale yellow. Take medicines. You will be told what medicines to take. Eat foods that are high in fiber. These include beans, whole grains, and fresh fruits and vegetables. Limit foods that are high in fat and sugar. These include fried or sweet foods. If you have a neck collar: Wear it as told by your doctor. Do not take it off unless told. Ask your doctor before adjusting your collar. If you have long hair, keep it outside of the collar. If you are allowed to take off the collar for cleaning and bathing: Follow instructions about how to take it off safely. Clean it by hand with mild soap and water. Let it air-dry fully. If your collar has pads that you can take out: Take the pads out every 1-2  days. Wash them by hand with soap and water. Let the pads air-dry fully before you put them back in the collar. Tell your doctor if your skin under the collar has irritation or sores. Managing pain, stiffness, and swelling     Use a cervical traction device, if told by your doctor. If told, put ice on the affected area. Put ice in a plastic bag. Place a towel between your skin and the bag. Leave the ice on for 20 minutes, 2-3 times a day. If told, put heat on the affected area. Do this before exercise or as often as told by your doctor. Use the heat source that your  doctor recommends, such as a moist heat pack or a heating pad. Place a towel between your skin and the heat source. Leave the heat on for 20-30 minutes. If your skin turns bright red, take off the ice or heat right away to prevent skin damage. The risk of damage is higher if you cannot feel pain, heat, or cold. Activity Do not drive while wearing a neck collar. If you do not have a neck collar, ask if it is safe to drive while your neck heals. Do not lift anything that is heavier than 10 lb (4.5 kg). Rest as told by your doctor. Avoid positions and activities that make you feel worse. Do exercises as told by your doctor or physical therapist. Return to your normal activities when your doctor says that it is safe. General instructions Do not smoke or use any products that contain nicotine or tobacco. These can delay healing. If you need help quitting, ask your doctor. Keep all follow-up visits. Your doctor will check your injury and activity level. How is this prevented? Use good posture. Adjust your workstation to help with this. Exercise often as told by your doctor or physical therapist. Avoid activities that are risky or may cause a neck sprain. Contact a doctor if: Your symptoms get worse or do not get better after 2 weeks. Your pain gets worse. Medicine does not help your pain. You have new symptoms. Your neck  collar gives you sores on your skin or bothers your skin. Get help right away if: You have very bad pain. You get any of the following in any part of your body: Loss of feeling. Tingling. Weakness. You cannot move a part of your body. You have neck pain and either of these: Very bad dizziness. A very bad headache. This information is not intended to replace advice given to you by your health care provider. Make sure you discuss any questions you have with your health care provider. Document Revised: 10/18/2021 Document Reviewed: 10/18/2021 Elsevier Patient Education  2024 Elsevier Inc.     Edwina Barth, MD  Primary Care at [facility]

## 2023-03-09 NOTE — Assessment & Plan Note (Signed)
Stable.  Diet and nutrition discussed.  Continue simvastatin 20 mg daily.

## 2023-03-09 NOTE — Patient Instructions (Signed)
Cervical Sprain A cervical sprain is also called a neck sprain. It is a stretch or tear in one or more ligaments in the neck. Ligaments are tissues that connect bones to each other. Neck sprains can be mild, bad, or very bad. A very bad sprain can cause problems with bones and other structures.  Most neck sprains heal in 4-6 weeks, but this depends on how bad the injury is. What are the causes? Neck sprains may be caused by trauma, such as: An injury from a motor vehicle accident. A fall. The head and neck being moved front to back or side to side all of a sudden (whiplash injury). Mild neck sprains may be caused by wear and tear over time. What increases the risk? Some activities put you at risk of hurting your neck. These include: Contact sports. Gymnastics. Diving. Arthritis caused by wear and tear of the joints in the spine. The neck not being very strong or flexible. Having had a neck injury in the past. Poor posture. Spending a lot of time in positions that put stress on the neck. This may be from sitting at a computer for a long time. What are the signs or symptoms? Signs or symptoms include any of these problems in your neck, shoulders, or upper back: Pain or tenderness. Stiffness. Swelling. A burning feeling. Sudden tightening of neck muscles (spasms). Not being able to move the neck very much. Headache. Feeling dizzy. Feeling like you may vomit, or vomiting. Having a hand or arm that: Feels weak. Loses feeling (feels numb). Tingles. How is this treated? This condition is treated by: Resting and putting ice or heat on your neck. Doing exercises to improve movement and strength in the injured area (physical therapy). In some cases, treatment may also include: Keeping your neck in place for a length of time. This may be done using: A neck collar. This supports your chin and the back of your head. A cervical traction device. This is a sling that holds up your head. It  removes weight and pressure from your neck. Medicines for pain or other symptoms. Surgery. This is rare. Follow these instructions at home: Medicines Take over-the-counter and prescription medicines only as told by your doctor. Ask your doctor if you should avoid driving or using machines while you are taking your medicine. If told, take steps to prevent problems with pooping (constipation). You may need to: Drink enough fluid to keep your pee (urine) pale yellow. Take medicines. You will be told what medicines to take. Eat foods that are high in fiber. These include beans, whole grains, and fresh fruits and vegetables. Limit foods that are high in fat and sugar. These include fried or sweet foods. If you have a neck collar: Wear it as told by your doctor. Do not take it off unless told. Ask your doctor before adjusting your collar. If you have long hair, keep it outside of the collar. If you are allowed to take off the collar for cleaning and bathing: Follow instructions about how to take it off safely. Clean it by hand with mild soap and water. Let it air-dry fully. If your collar has pads that you can take out: Take the pads out every 1-2 days. Wash them by hand with soap and water. Let the pads air-dry fully before you put them back in the collar. Tell your doctor if your skin under the collar has irritation or sores. Managing pain, stiffness, and swelling     Use a  cervical traction device, if told by your doctor. If told, put ice on the affected area. Put ice in a plastic bag. Place a towel between your skin and the bag. Leave the ice on for 20 minutes, 2-3 times a day. If told, put heat on the affected area. Do this before exercise or as often as told by your doctor. Use the heat source that your doctor recommends, such as a moist heat pack or a heating pad. Place a towel between your skin and the heat source. Leave the heat on for 20-30 minutes. If your skin turns bright  red, take off the ice or heat right away to prevent skin damage. The risk of damage is higher if you cannot feel pain, heat, or cold. Activity Do not drive while wearing a neck collar. If you do not have a neck collar, ask if it is safe to drive while your neck heals. Do not lift anything that is heavier than 10 lb (4.5 kg). Rest as told by your doctor. Avoid positions and activities that make you feel worse. Do exercises as told by your doctor or physical therapist. Return to your normal activities when your doctor says that it is safe. General instructions Do not smoke or use any products that contain nicotine or tobacco. These can delay healing. If you need help quitting, ask your doctor. Keep all follow-up visits. Your doctor will check your injury and activity level. How is this prevented? Use good posture. Adjust your workstation to help with this. Exercise often as told by your doctor or physical therapist. Avoid activities that are risky or may cause a neck sprain. Contact a doctor if: Your symptoms get worse or do not get better after 2 weeks. Your pain gets worse. Medicine does not help your pain. You have new symptoms. Your neck collar gives you sores on your skin or bothers your skin. Get help right away if: You have very bad pain. You get any of the following in any part of your body: Loss of feeling. Tingling. Weakness. You cannot move a part of your body. You have neck pain and either of these: Very bad dizziness. A very bad headache. This information is not intended to replace advice given to you by your health care provider. Make sure you discuss any questions you have with your health care provider. Document Revised: 10/18/2021 Document Reviewed: 10/18/2021 Elsevier Patient Education  2024 [employer].

## 2023-04-07 DIAGNOSIS — C61 Malignant neoplasm of prostate: Secondary | ICD-10-CM | POA: Diagnosis not present

## 2023-04-14 DIAGNOSIS — E291 Testicular hypofunction: Secondary | ICD-10-CM | POA: Diagnosis not present

## 2023-04-14 DIAGNOSIS — C61 Malignant neoplasm of prostate: Secondary | ICD-10-CM | POA: Diagnosis not present

## 2023-05-07 ENCOUNTER — Other Ambulatory Visit: Payer: Self-pay | Admitting: Emergency Medicine

## 2023-05-14 ENCOUNTER — Institutional Professional Consult (permissible substitution): Payer: Medicare Other | Admitting: Internal Medicine

## 2023-06-09 ENCOUNTER — Other Ambulatory Visit: Payer: Self-pay | Admitting: Emergency Medicine

## 2023-07-02 ENCOUNTER — Other Ambulatory Visit: Payer: Self-pay | Admitting: Emergency Medicine

## 2023-07-02 DIAGNOSIS — M62838 Other muscle spasm: Secondary | ICD-10-CM

## 2023-07-06 DIAGNOSIS — H401111 Primary open-angle glaucoma, right eye, mild stage: Secondary | ICD-10-CM | POA: Diagnosis not present

## 2023-07-06 DIAGNOSIS — E0500 Thyrotoxicosis with diffuse goiter without thyrotoxic crisis or storm: Secondary | ICD-10-CM | POA: Diagnosis not present

## 2023-07-06 DIAGNOSIS — H401122 Primary open-angle glaucoma, left eye, moderate stage: Secondary | ICD-10-CM | POA: Diagnosis not present

## 2023-07-06 DIAGNOSIS — H04123 Dry eye syndrome of bilateral lacrimal glands: Secondary | ICD-10-CM | POA: Diagnosis not present

## 2023-07-17 ENCOUNTER — Other Ambulatory Visit (HOSPITAL_COMMUNITY): Payer: Self-pay

## 2023-07-20 ENCOUNTER — Ambulatory Visit (INDEPENDENT_AMBULATORY_CARE_PROVIDER_SITE_OTHER): Payer: Medicare Other

## 2023-07-20 ENCOUNTER — Other Ambulatory Visit: Payer: Self-pay | Admitting: Emergency Medicine

## 2023-07-20 VITALS — Ht 71.0 in | Wt 181.0 lb

## 2023-07-20 DIAGNOSIS — Z0000 Encounter for general adult medical examination without abnormal findings: Secondary | ICD-10-CM

## 2023-07-20 NOTE — Progress Notes (Signed)
 Subjective:   Douglas [name] is a 88 y.o. who presents for a Medicare Wellness preventive visit.  Visit Complete: Virtual I connected with  Douglas [name] on 07/22/23 by a audio enabled telemedicine application and verified that I am speaking with the correct person using two identifiers.  Patient Location: Home  Provider Location: Office/Clinic  I discussed the limitations of evaluation and management by telemedicine. The patient expressed understanding and agreed to proceed.  Vital Signs: Because this visit was a virtual/telehealth visit, some criteria may be missing or patient reported. Any vitals not documented were not able to be obtained and vitals that have been documented are patient reported.  VideoDeclined- This patient declined Librarian, academic. Therefore the visit was completed with audio only.  Persons Participating in Visit: Patient assisted by daughter, Douglas [name] [phone]).  AWV Questionnaire: No: Patient Medicare AWV questionnaire was not completed prior to this visit.  Cardiac Risk Factors include: advanced age (>25men, >76 women);diabetes mellitus;male gender     Objective:    Today's Vitals   07/20/23 1058  Weight: 181 lb (82.1 kg)  Height: 5\' 11"  (1.803 m)   Body mass index is 25.24 kg/m.     07/20/2023   10:54 AM 05/08/2022    2:51 PM 05/07/2022   10:13 AM 07/12/2021    1:38 PM 03/12/2020   11:04 AM 01/04/2019   11:12 AM 04/30/2017    8:09 AM  Advanced Directives  Does Patient Have a Medical Advance Directive? Yes  No Yes Yes Yes Yes  Type of Estate agent of [place];Living will   Healthcare Power of [place];Living will Healthcare Power of [employer] Power of [employer] Power of [place];Living will  Copy of Healthcare Power of Attorney in Chart? No - copy requested   No - copy requested No - copy requested    Would patient like information on creating a medical advance  directive?  No - Patient declined         Current Medications (verified) Outpatient Encounter Medications as of 07/20/2023  Medication Sig   acetaminophen  (TYLENOL ) 500 MG tablet Take 500-1,000 mg by mouth every 6 (six) hours as needed for mild pain or headache.   albuterol  (VENTOLIN  HFA) 108 (90 Base) MCG/ACT inhaler TAKE 2 PUFFS BY MOUTH EVERY 6 HOURS AS NEEDED FOR WHEEZE OR SHORTNESS OF BREATH Strength: 108 (90 Base) MCG/ACT   alfuzosin  (UROXATRAL ) 10 MG 24 hr tablet Take 10 mg by mouth at bedtime.   aspirin  EC 81 MG tablet Take 81 mg by mouth at bedtime. Swallow whole.   blood glucose meter kit and supplies 1 each by Other route 3 (three) times daily.   brimonidine  (ALPHAGAN ) 0.2 % ophthalmic solution Place 1 drop into the left eye daily.   Budeson-Glycopyrrol-Formoterol (BREZTRI  AEROSPHERE) 160-9-4.8 MCG/ACT AERO Inhale 2 puffs into the lungs 2 (two) times daily.   cetirizine  (ZYRTEC ) 10 MG tablet Take 1 tablet (10 mg total) by mouth daily.   Cholecalciferol 125 MCG (5000 UT) TABS Take 1 tablet by mouth daily.   cyclobenzaprine  (FLEXERIL ) 10 MG tablet TAKE 1 TABLET BY MOUTH EVERYDAY AT BEDTIME (Patient taking differently: Take 10 mg by mouth at bedtime.)   dorzolamide -timolol  (COSOPT ) 2-0.5 % ophthalmic solution Place 1 drop into both eyes 2 (two) times daily.   Glucagon  (GVOKE HYPOPEN  2-PACK) 0.5 MG/0.1ML SOAJ Inject 0.5 mg into the skin as needed.   Insulin  Lispro Prot & Lispro (HUMALOG  75/25 MIX) (75-25) 100 UNIT/ML [employer]  15 UNITS INTO THE SKIN 2 (TWO) TIMES DAILY.   Insulin  Pen Needle (B-D UF III MINI PEN NEEDLES) 31G X 5 MM MISC USE AS DIRECTED TWICE A DAY   JARDIANCE  10 MG TABS tablet TAKE 1 TABLET BY MOUTH EVERY DAY BEFORE BREAKFAST   Lancets (ONETOUCH ULTRASOFT) lancets Use as instructed to check his blood sugars 3 times a day   latanoprost  (XALATAN ) 0.005 % ophthalmic solution Place 1 drop into both eyes at bedtime.   linaclotide  (LINZESS ) 72 MCG capsule Take 1 capsule  (72 mcg total) by mouth daily before breakfast.   metFORMIN  (GLUCOPHAGE ) 500 MG tablet TAKE 1 TABLET BY MOUTH 2 TIMES DAILY WITH A MEAL.   Multiple Vitamin (MULTIVITAMIN) tablet Take 1 tablet by mouth at bedtime.   pantoprazole  (PROTONIX ) 40 MG tablet TAKE 1 TABLET BY MOUTH EVERY DAY   RESTASIS  0.05 % ophthalmic emulsion Place 1 drop into both eyes 2 (two) times daily.   RHOPRESSA  0.02 % SOLN Place 1 drop into both eyes at bedtime.   simvastatin  (ZOCOR ) 20 MG tablet TAKE 1 TABLET DAILY AT 6PM.   tamsulosin (FLOMAX) 0.4 MG CAPS capsule Take 0.4 mg by mouth daily.   venlafaxine (EFFEXOR) 37.5 MG tablet TAKE 1 TABLET BY MOUTH EVERY DAY   Vibegron (GEMTESA) 75 MG TABS Take 75 tablets by mouth daily.   [DISCONTINUED] albuterol  (VENTOLIN  HFA) 108 (90 Base) MCG/ACT inhaler TAKE 2 PUFFS BY MOUTH EVERY 6 HOURS AS NEEDED FOR WHEEZE OR SHORTNESS OF BREATH Strength: 108 (90 Base) MCG/ACT   [DISCONTINUED] glucose blood test strip Use as instructed to check his blood sugars 3 times a day   famotidine  (PEPCID ) 40 MG tablet Take 1 tablet (40 mg total) by mouth at bedtime for 14 days.   No facility-administered encounter medications on file as of 07/20/2023.    Allergies (verified) Patient has no known allergies.   History: Past Medical History:  Diagnosis Date   Adenomatous colon polyp 03/2001   Allergy    SEASONAL   Cancer (HCC)    Cataract    Dizziness    GERD (gastroesophageal reflux disease)    Hearing loss    Hyperlipidemia    Low testosterone     Past Surgical History:  Procedure Laterality Date   APPENDECTOMY     COLONOSCOPY     EYE SURGERY     POLYPECTOMY     PROSTATE SURGERY     SPINE SURGERY     Family History  Problem Relation Age of Onset   Cancer Father 82       pancreatic   Other Mother        age 68   Prostate cancer Brother    Colon cancer Neg Hx    Esophageal cancer Neg Hx    Liver cancer Neg Hx    Pancreatic cancer Neg Hx    Rectal cancer Neg Hx    Stomach  cancer Neg Hx    Social History   Socioeconomic History   Marital status: Married    Spouse name: Not on file   Number of children: 3   Years of education: 2 years [employer]   Highest education level: Not on file  Occupational History   Occupation: RETIRED  Tobacco Use   Smoking status: Never    Passive exposure: Never   Smokeless tobacco: Never  Vaping Use   Vaping status: Never Used  Substance and Sexual Activity   Alcohol use: No    Alcohol/week: 0.0 standard drinks of  alcohol   Drug use: No   Sexual activity: Yes  Other Topics Concern   Not on file  Social History Narrative   Married. Education: [employer]. Exercise: Yes.   Right-handed.   Drinks coffee daily.   Lives at home with his wife.   Social Drivers of Corporate investment banker Strain: Low Risk  (07/20/2023)   Overall Financial Resource Strain (CARDIA)    Difficulty of Paying Living Expenses: Not hard at all  Food Insecurity: No Food Insecurity (07/20/2023)   Hunger Vital Sign    Worried About Running Out of Food in the Last Year: Never true    Ran Out of Food in the Last Year: Never true  Transportation Needs: No Transportation Needs (07/20/2023)   PRAPARE - Administrator, Civil Service (Medical): No    Lack of Transportation (Non-Medical): No  Physical Activity: Sufficiently Active (07/20/2023)   Exercise Vital Sign    Days of Exercise per Week: 4 days    Minutes of Exercise per Session: 50 min  Stress: No Stress Concern Present (07/20/2023)   [employer] of Occupational Health - Occupational Stress Questionnaire    Feeling of Stress : Not at all  Social Connections: Moderately Isolated (07/20/2023)   Social Connection and Isolation Panel [NHANES]    Frequency of Communication with Friends and Family: More than three times a week    Frequency of Social Gatherings with Friends and Family: Once a week    Attends Religious Services: Never    Database administrator or Organizations: No    Attends  Engineer, structural: Never    Marital Status: Married    Tobacco Counseling Counseling given: No    Clinical Intake:  Pre-visit preparation completed: Yes  Pain : No/denies pain     BMI - recorded: 25.24 Nutritional Risks: None Diabetes: No  Lab Results  Component Value Date   HGBA1C 5.7 (A) 01/27/2023   HGBA1C 14.5 Repeated and verified X2. (H) 05/06/2022   HGBA1C 7.7 (H) 11/05/2021     How often do you need to have someone help you when you read instructions, pamphlets, or other written materials from your doctor or pharmacy?: 1 - Never  Interpreter Needed?: No  Information entered by :: Kandy Orris, CMA   Activities of Daily Living     07/20/2023   11:04 AM  In your present state of health, do you have any difficulty performing the following activities:  Hearing? 0  Vision? 0  Difficulty concentrating or making decisions? 0  Walking or climbing stairs? 0  Dressing or bathing? 0  Doing errands, shopping? 0  Preparing Food and eating ? N  Using the Toilet? N  In the past six months, have you accidently leaked urine? Y  Comment wears depends  Do you have problems with loss of bowel control? N  Managing your Medications? N  Managing your Finances? N  Housekeeping or managing your Housekeeping? N    Patient Care Team: Elvira Hammersmith, MD as PCP - General (Internal Medicine) Annamarie Kid, MD (Inactive) as Consulting Physician (Urology) Lenton Rail, MD as Consulting Physician (Otolaryngology) Florance Hun, MD as Consulting Physician (Ophthalmology)  Indicate any recent Medical Services you may have received from other than [facility] providers in the past year (date may be approximate).     Assessment:   This is a routine wellness examination for [place].  Hearing/Vision screen Hearing Screening - Comments:: Wears hearing aids but plans to f/u  in June 2025 due to difficulty hearing/and comfort Vision Screening - Comments:: Wears  rx glasses - up to date with routine eye exams with Dr Carloyn Chi (at [facility])   Goals Addressed               This Visit's Progress     Patient Stated (pt-stated)        Patient stated he'd like to stay active and goal to reduce/discontinue insulin  (manage diabetes levels).       Depression Screen     07/20/2023   11:14 AM 03/09/2023   10:26 AM 02/19/2023    3:32 PM 01/27/2023   10:21 AM 07/15/2022   12:43 PM 06/03/2022    9:52 AM 05/12/2022    1:05 PM  PHQ 2/9 Scores  PHQ - 2 Score 0 0 0 0 0 0 0  PHQ- 9 Score 0          Fall Risk     07/20/2023   11:07 AM 03/09/2023   10:26 AM 02/19/2023    3:31 PM 01/27/2023   10:21 AM 07/15/2022   11:14 AM  Fall Risk   Falls in the past year? 1 0 0 0 1  Number falls in past yr: 0 0 0 0 1  Comment Jan - 2024      Injury with Fall? 0 0 0 0 0  Comment     no injuries  Risk for fall due to :  No Fall Risks No Fall Risks No Fall Risks No Fall Risks;History of fall(s)  Follow up Falls evaluation completed;Falls prevention discussed Falls evaluation completed Falls evaluation completed Falls evaluation completed Falls prevention discussed    MEDICARE RISK AT HOME:  Medicare Risk at Home Any stairs in or around the home?: Yes If so, are there any without handrails?: No Home free of loose throw rugs in walkways, pet beds, electrical cords, etc?: Yes Adequate lighting in your home to reduce risk of falls?: Yes Life alert?: No Use of a cane, walker or w/c?: No Grab bars in the bathroom?: Yes Shower chair or bench in shower?: No Elevated toilet seat or a handicapped toilet?: Yes  TIMED UP AND GO:  Was the test performed?  No  Cognitive Function: 6CIT completed        07/20/2023   11:13 AM 07/15/2022   11:29 AM 03/12/2020   11:02 AM 01/04/2019   11:10 AM 11/14/2016   10:49 AM  6CIT Screen  What Year? 0 points 0 points 0 points 0 points 0 points  What month? 0 points 0 points 0 points 0 points 0 points  What time? 0 points 0  points 0 points 0 points 0 points  Count back from 20 0 points 0 points 0 points 0 points 0 points  Months in reverse 0 points 0 points 0 points 0 points 2 points  Repeat phrase 0 points 2 points 0 points 0 points 0 points  Total Score 0 points 2 points 0 points 0 points 2 points    Immunizations Immunization History  Administered Date(s) Administered   Fluad Quad(high Dose 65+) 11/30/2018, 12/27/2019, 12/12/2020, 01/12/2022, 11/30/2022   Influenza Split 12/30/2010, 12/25/2011   Influenza,inj,Quad PF,6+ Mos 12/07/2012, 12/13/2013, 12/06/2014, 12/07/2015, 12/18/2016, 12/21/2017   PFIZER(Purple Top)SARS-COV-2 Vaccination 05/07/2019, 05/30/2019, 01/09/2020   Pfizer Covid-19 Vaccine Bivalent Booster 38yrs & up 07/18/2020, 02/13/2021   Pneumococcal Conjugate-13 04/24/2014   Pneumococcal Polysaccharide-23 05/31/2001, 06/08/2009   Td 06/12/2008   Td (Adult), 2 Lf Tetanus Toxid,  Preservative Free 06/12/2008   Unspecified SARS-COV-2 Vaccination 12/01/2022, 12/08/2022   Zoster, Live 03/31/2008    Screening Tests Health Maintenance  Topic Date Due   FOOT EXAM  03/08/2020   COVID-19 Vaccine (7 - Pfizer risk 2024-25 season) 06/07/2023   Zoster Vaccines- Shingrix (1 of 2) 10/27/2023 (Originally 05/28/1954)   Colonoscopy  01/27/2024 (Originally 04/30/2020)   HEMOGLOBIN A1C  07/28/2023   INFLUENZA VACCINE  10/30/2023   OPHTHALMOLOGY EXAM  01/01/2024   Medicare Annual Wellness (AWV)  07/19/2024   Pneumonia Vaccine 65+ Years old  Completed   HPV VACCINES  Aged Out   Meningococcal B Vaccine  Aged Out   DTaP/Tdap/Td  Discontinued    Health Maintenance  Health Maintenance Due  Topic Date Due   FOOT EXAM  03/08/2020   COVID-19 Vaccine (7 - Pfizer risk 2024-25 season) 06/07/2023   Health Maintenance Items Addressed:07/20/2023   Additional Screening:  Vision Screening: Recommended annual ophthalmology exams for early detection of glaucoma and other disorders of the eye.  Dental Screening:  Recommended annual dental exams for proper oral hygiene  Community Resource Referral / Chronic Care Management: CRR required this visit?  No   CCM required this visit?  No     Plan:     I have personally reviewed and noted the following in the patient's chart:   Medical and social history Use of alcohol, tobacco or illicit drugs  Current medications and supplements including opioid prescriptions. Patient is not currently taking opioid prescriptions. Functional ability and status Nutritional status Physical activity Advanced directives List of other physicians Hospitalizations, surgeries, and ER visits in previous 12 months Vitals Screenings to include cognitive, depression, and falls Referrals and appointments  In addition, I have reviewed and discussed with patient certain preventive protocols, quality metrics, and best practice recommendations. A written personalized care plan for preventive services as well as general preventive health recommendations were provided to patient.     Patria Bookbinder, CMA   07/22/2023   After Visit Summary: (MyChart) Due to this being a telephonic visit, the after visit summary with patients personalized plan was offered to patient via MyChart   Notes: Nothing significant to report at this time.

## 2023-07-20 NOTE — Patient Instructions (Signed)
 Douglas [name] , Thank you for taking time to come for your Medicare Wellness Visit. I appreciate your ongoing commitment to your health goals. Please review the following plan we discussed and let me know if I can assist you in the future.   Referrals/Orders/Follow-Ups/Clinician Recommendations: Aim for 30 minutes of exercise or brisk walking, 6-8 glasses of water, and 5 servings of fruits and vegetables each day.   This is a list of the screening recommended for you and due dates:  Health Maintenance  Topic Date Due   Complete foot exam   03/08/2020   COVID-19 Vaccine (7 - Pfizer risk 2024-25 season) 06/07/2023   Zoster (Shingles) Vaccine (1 of 2) 10/27/2023*   Colon Cancer Screening  01/27/2024*   Hemoglobin A1C  07/28/2023   Flu Shot  10/30/2023   Eye exam for diabetics  01/01/2024   Medicare Annual Wellness Visit  07/19/2024   Pneumonia Vaccine  Completed   HPV Vaccine  Aged Out   Meningitis B Vaccine  Aged Out   DTaP/Tdap/Td vaccine  Discontinued  *Topic was postponed. The date shown is not the original due date.    Advanced directives: (Copy Requested) Please bring a copy of your health care power of attorney and living will to the office to be added to your chart at your convenience. You can mail to [facility] [address] [place], [place] [address] or email to [email or web address]  Next Medicare Annual Wellness Visit scheduled for next year: Yes

## 2023-07-28 ENCOUNTER — Encounter: Payer: Self-pay | Admitting: Emergency Medicine

## 2023-07-28 ENCOUNTER — Ambulatory Visit (INDEPENDENT_AMBULATORY_CARE_PROVIDER_SITE_OTHER): Payer: Medicare Other | Admitting: Emergency Medicine

## 2023-07-28 ENCOUNTER — Telehealth: Payer: Self-pay

## 2023-07-28 VITALS — BP 126/70 | HR 73 | Temp 98.3°F | Ht 71.0 in | Wt 182.8 lb

## 2023-07-28 DIAGNOSIS — E1169 Type 2 diabetes mellitus with other specified complication: Secondary | ICD-10-CM

## 2023-07-28 DIAGNOSIS — C61 Malignant neoplasm of prostate: Secondary | ICD-10-CM | POA: Diagnosis not present

## 2023-07-28 DIAGNOSIS — E785 Hyperlipidemia, unspecified: Secondary | ICD-10-CM

## 2023-07-28 DIAGNOSIS — Z7985 Long-term (current) use of injectable non-insulin antidiabetic drugs: Secondary | ICD-10-CM

## 2023-07-28 DIAGNOSIS — Z7984 Long term (current) use of oral hypoglycemic drugs: Secondary | ICD-10-CM

## 2023-07-28 DIAGNOSIS — N320 Bladder-neck obstruction: Secondary | ICD-10-CM | POA: Diagnosis not present

## 2023-07-28 DIAGNOSIS — I700 Atherosclerosis of aorta: Secondary | ICD-10-CM

## 2023-07-28 DIAGNOSIS — J410 Simple chronic bronchitis: Secondary | ICD-10-CM

## 2023-07-28 DIAGNOSIS — M62838 Other muscle spasm: Secondary | ICD-10-CM

## 2023-07-28 DIAGNOSIS — M65331 Trigger finger, right middle finger: Secondary | ICD-10-CM | POA: Diagnosis not present

## 2023-07-28 LAB — COMPREHENSIVE METABOLIC PANEL WITH GFR
ALT: 26 U/L (ref 0–53)
AST: 22 U/L (ref 0–37)
Albumin: 4.3 g/dL (ref 3.5–5.2)
Alkaline Phosphatase: 61 U/L (ref 39–117)
BUN: 17 mg/dL (ref 6–23)
CO2: 30 meq/L (ref 19–32)
Calcium: 9.9 mg/dL (ref 8.4–10.5)
Chloride: 103 meq/L (ref 96–112)
Creatinine, Ser: 1.07 mg/dL (ref 0.40–1.50)
GFR: 62.99 mL/min (ref >60.00)
Glucose, Bld: 100 mg/dL — ABNORMAL HIGH (ref 70–99)
Potassium: 4.2 meq/L (ref 3.5–5.1)
Sodium: 138 meq/L (ref 135–145)
Total Bilirubin: 0.5 mg/dL (ref 0.2–1.2)
Total Protein: 7.8 g/dL (ref 6.0–8.3)

## 2023-07-28 LAB — LIPID PANEL
Cholesterol: 117 mg/dL (ref 0–200)
HDL: 46.7 mg/dL (ref >39.00)
LDL Cholesterol: 45 mg/dL (ref 0–99)
NonHDL: 70.13
Total CHOL/HDL Ratio: 3
Triglycerides: 125 mg/dL (ref 0.0–149.0)
VLDL: 25 mg/dL (ref 0.0–40.0)

## 2023-07-28 LAB — HEMOGLOBIN A1C: Hgb A1c MFr Bld: 6.6 % — ABNORMAL HIGH (ref 4.6–6.5)

## 2023-07-28 MED ORDER — CYCLOBENZAPRINE HCL 10 MG PO TABS: 10.0000 mg | ORAL_TABLET | Freq: Every evening | ORAL | 1 refills | Status: DC | PRN

## 2023-07-28 NOTE — Progress Notes (Signed)
 Douglas [name] 88 y.o.   Chief Complaint  Patient presents with   Follow-up   Hand Pain    Middle finger, right hand. Finger is stiff and tight. Unable to make a fist   Neck Pain    Ongoing, states the medication given for this has not helped    HISTORY OF PRESENT ILLNESS: This is a 88 y.o. male accompanied by wife and daughter today, here for follow-up of chronic medical conditions Also complaining of pain to right middle finger and trouble making a spontaneous full extension Also complaining of chronic neck pain.  Cyclobenzaprine  helps a little bit.  Requesting refill.  Hand Pain  Pertinent negatives include no chest pain.  Neck Pain  Pertinent negatives include no chest pain, fever or headaches.     Prior to Admission medications   Medication Sig Start Date End Date Taking? Authorizing Provider  acetaminophen  (TYLENOL ) 500 MG tablet Take 500-1,000 mg by mouth every 6 (six) hours as needed for mild pain or headache.   Yes [provider]  albuterol  (VENTOLIN  HFA) 108 (90 Base) MCG/ACT inhaler TAKE 2 PUFFS BY MOUTH EVERY 6 HOURS AS NEEDED FOR WHEEZE OR SHORTNESS OF BREATH Strength: 108 (90 Base) MCG/ACT 01/27/23  Yes Rickell Wiehe, [name], MD  alfuzosin  (UROXATRAL ) 10 MG 24 hr tablet Take 10 mg by mouth at bedtime.   Yes [provider]  aspirin  EC 81 MG tablet Take 81 mg by mouth at bedtime. Swallow whole.   Yes [provider]  blood glucose meter kit and supplies 1 each by Other route 3 (three) times daily. 05/09/22  Yes Leona Rake, MD  brimonidine  (ALPHAGAN ) 0.2 % ophthalmic solution Place 1 drop into the left eye daily.   Yes [provider]  Budeson-Glycopyrrol-Formoterol (BREZTRI  AEROSPHERE) 160-9-4.8 MCG/ACT AERO Inhale 2 puffs into the lungs 2 (two) times daily. 02/19/23  Yes Samanda Buske, [name], MD  cetirizine  (ZYRTEC ) 10 MG tablet Take 1 tablet (10 mg total) by mouth daily. 01/27/23  Yes Elvira Hammersmith, MD  Cholecalciferol  125 MCG (5000 UT) TABS Take 1 tablet by mouth daily.   Yes [provider]  dorzolamide -timolol  (COSOPT ) 2-0.5 % ophthalmic solution Place 1 drop into both eyes 2 (two) times daily.   Yes [provider]  Glucagon  (GVOKE HYPOPEN  2-PACK) 0.5 MG/0.1ML SOAJ Inject 0.5 mg into the skin as needed. 05/12/22  Yes Mia Winthrop Jose, MD  Insulin  Lispro Prot & Lispro (HUMALOG  75/25 MIX) (75-25) 100 UNIT/ML Kwikpen INJECT 15 UNITS INTO THE SKIN 2 (TWO) TIMES DAILY. 05/07/23  Yes Lavanya Roa, [name], MD  Insulin  Pen Needle (B-D UF III MINI PEN NEEDLES) 31G X 5 MM MISC USE AS DIRECTED TWICE A DAY 06/09/23  Yes Tezra Mahr, [name], MD  JARDIANCE  10 MG TABS tablet TAKE 1 TABLET BY MOUTH EVERY DAY BEFORE BREAKFAST 02/25/23  Yes Amauris Debois, [name], MD  Lancets [facility] ULTRASOFT) lancets Use as instructed to check his blood sugars 3 times a day 05/12/22  Yes Kendrell Lottman, [name], MD  latanoprost  (XALATAN ) 0.005 % ophthalmic solution Place 1 drop into both eyes at bedtime. 07/14/19  Yes [provider]  linaclotide  (LINZESS ) 72 MCG capsule Take 1 capsule (72 mcg total) by mouth daily before breakfast. 07/15/22  Yes Hiliana Eilts, [name], MD  metFORMIN  (GLUCOPHAGE ) 500 MG tablet TAKE 1 TABLET BY MOUTH 2 TIMES DAILY WITH A MEAL. 02/26/23  Yes Yoseline Andersson, [name], MD  Multiple Vitamin (MULTIVITAMIN) tablet Take 1 tablet by mouth at bedtime.  Yes [provider]  ONETOUCH VERIO test strip USE AS INSTRUCTED TO CHECK HIS BLOOD SUGARS 3 TIMES A DAY 07/20/23  Yes Lafayette Dunlevy, [name], MD  pantoprazole  (PROTONIX ) 40 MG tablet TAKE 1 TABLET BY MOUTH EVERY DAY 02/26/23  Yes Quandarius Nill, [name], MD  RESTASIS  0.05 % ophthalmic emulsion Place 1 drop into both eyes 2 (two) times daily. 07/13/17  Yes [provider]  RHOPRESSA  0.02 % SOLN Place 1 drop into both eyes at bedtime.   Yes [provider]  simvastatin  (ZOCOR ) 20 MG tablet TAKE 1 TABLET DAILY AT 6PM. 01/03/23   Yes Yarel Kilcrease, [name], MD  tamsulosin (FLOMAX) 0.4 MG CAPS capsule Take 0.4 mg by mouth daily.   Yes [provider]  venlafaxine (EFFEXOR) 37.5 MG tablet TAKE 1 TABLET BY MOUTH EVERY DAY 02/15/23  Yes Ronisha Herringshaw, [place], MD  Vibegron (GEMTESA) 75 MG TABS Take 75 tablets by mouth daily.   Yes [provider]  cyclobenzaprine  (FLEXERIL ) 10 MG tablet Take 1 tablet (10 mg total) by mouth at bedtime as needed for muscle spasms. TAKE 1 TABLET BY MOUTH EVERYDAY AT BEDTIME 07/28/23   Latonyia Lopata Jose, MD  famotidine  (PEPCID ) 40 MG tablet Take 1 tablet (40 mg total) by mouth at bedtime for 14 days. 02/19/23 03/05/23  Elvira Hammersmith, MD    No Known Allergies  Patient Active Problem List   Diagnosis Date Noted   Neck muscle spasm 03/09/2023   Chronic cough 02/19/2023   Dyslipidemia associated with type 2 diabetes mellitus (HCC) 05/12/2022   Hyperglycemia 05/08/2022   Uncontrolled type 2 diabetes mellitus with hyperglycemia (HCC) 05/07/2022   Glaucoma 05/07/2022   Neuropathic pain of both feet 11/05/2021   Decreased hearing of left ear 11/05/2021   Simple chronic bronchitis (HCC) 06/18/2021   Diet-controlled diabetes mellitus (HCC) 09/20/2020   History of prostate cancer 09/20/2020   Atherosclerosis of aorta (HCC) 09/20/2020   B12 deficiency 12/16/2016   Bladder outlet obstruction 04/25/2015   Erectile dysfunction due to arterial insufficiency 04/25/2015   Osteoporosis 04/25/2015   Prostate cancer (HCC) 05/30/2011   Dyslipidemia 05/30/2011   Hypogonadism male 05/30/2011    Past Medical History:  Diagnosis Date   Adenomatous colon polyp 03/2001   Allergy    SEASONAL   Cancer (HCC)    Cataract    Dizziness    GERD (gastroesophageal reflux disease)    Hearing loss    Hyperlipidemia    Low testosterone      Past Surgical History:  Procedure Laterality Date   APPENDECTOMY     COLONOSCOPY     EYE SURGERY     POLYPECTOMY     PROSTATE SURGERY      SPINE SURGERY      Social History   Socioeconomic History   Marital status: Married    Spouse name: Not on file   Number of children: 3   Years of education: 2 years [employer]   Highest education level: Not on file  Occupational History   Occupation: RETIRED  Tobacco Use   Smoking status: Never    Passive exposure: Never   Smokeless tobacco: Never  Vaping Use   Vaping status: Never Used  Substance and Sexual Activity   Alcohol use: No    Alcohol/week: 0.0 standard drinks of alcohol   Drug use: No   Sexual activity: Yes  Other Topics Concern   Not on file  Social History Narrative   Married. Education: [employer]. Exercise: Yes.   Right-handed.  Drinks coffee daily.   Lives at home with his wife.   Social Drivers of Corporate investment banker Strain: Low Risk  (07/20/2023)   Overall Financial Resource Strain (CARDIA)    Difficulty of Paying Living Expenses: Not hard at all  Food Insecurity: No Food Insecurity (07/20/2023)   Hunger Vital Sign    Worried About Running Out of Food in the Last Year: Never true    Ran Out of Food in the Last Year: Never true  Transportation Needs: No Transportation Needs (07/20/2023)   PRAPARE - Administrator, Civil Service (Medical): No    Lack of Transportation (Non-Medical): No  Physical Activity: Sufficiently Active (07/20/2023)   Exercise Vital Sign    Days of Exercise per Week: 4 days    Minutes of Exercise per Session: 50 min  Stress: No Stress Concern Present (07/20/2023)   [employer] of Occupational Health - Occupational Stress Questionnaire    Feeling of Stress : Not at all  Social Connections: Moderately Isolated (07/20/2023)   Social Connection and Isolation Panel [NHANES]    Frequency of Communication with Friends and Family: More than three times a week    Frequency of Social Gatherings with Friends and Family: Once a week    Attends Religious Services: Never    Database administrator or Organizations: No     Attends Banker Meetings: Never    Marital Status: Married  Catering manager Violence: Not At Risk (07/20/2023)   Humiliation, Afraid, Rape, and Kick questionnaire    Fear of Current or Ex-Partner: No    Emotionally Abused: No    Physically Abused: No    Sexually Abused: No    Family History  Problem Relation Age of Onset   Cancer Father 36       pancreatic   Other Mother        age 32   Prostate cancer Brother    Colon cancer Neg Hx    Esophageal cancer Neg Hx    Liver cancer Neg Hx    Pancreatic cancer Neg Hx    Rectal cancer Neg Hx    Stomach cancer Neg Hx      Review of Systems  Constitutional: Negative.  Negative for chills and fever.  [name]: Negative.  Negative for congestion and sore throat.   Respiratory: Negative.  Negative for cough and shortness of breath.   Cardiovascular: Negative.  Negative for chest pain and palpitations.  Gastrointestinal:  Negative for abdominal pain, diarrhea, nausea and vomiting.  Genitourinary: Negative.  Negative for dysuria and hematuria.  Musculoskeletal:  Positive for neck pain.  Skin: Negative.  Negative for rash.  Neurological: Negative.  Negative for dizziness and headaches.  All other systems reviewed and are negative.   Vitals:   07/28/23 1053  BP: 126/70  Pulse: 73  Temp: 98.3 F (36.8 C)  SpO2: 96%    Physical Exam Vitals reviewed.  Constitutional:      Appearance: Normal appearance.  [name]:     Head: Normocephalic.  Eyes:     Extraocular Movements: Extraocular movements intact.     Pupils: Pupils are equal, round, and reactive to light.  Cardiovascular:     Rate and Rhythm: Normal rate and regular rhythm.     Pulses: Normal pulses.     Heart sounds: Normal heart sounds.  Pulmonary:     Effort: Pulmonary effort is normal.     Breath sounds: Normal breath sounds.  Abdominal:  Palpations: Abdomen is soft.     Tenderness: There is no abdominal tenderness.  Musculoskeletal:     Cervical back:  No tenderness.     Comments: Right hand: Middle trigger finger  Lymphadenopathy:     Cervical: No cervical adenopathy.  Skin:    General: Skin is warm and dry.     Capillary Refill: Capillary refill takes less than 2 seconds.  Neurological:     General: No focal deficit present.     Mental Status: He is alert and oriented to person, place, and time.  Psychiatric:        Mood and Affect: Mood normal.        Behavior: Behavior normal.      ASSESSMENT & PLAN: A total of 44 minutes was spent with the patient and counseling/coordination of care regarding preparing for this visit, review of most recent office visit notes, review of multiple chronic medical conditions and their management, review of all medications, review of most recent bloodwork results, review of health maintenance items, education on nutrition, prognosis, documentation, and need for follow up.   Problem List Items Addressed This Visit       Cardiovascular and Mediastinum   Atherosclerosis of aorta (HCC)   Continue simvastatin  20 mg daily        Respiratory   Simple chronic bronchitis (HCC)   Stable chronic condition Continue Breztri  twice a day         Endocrine   Dyslipidemia associated with type 2 diabetes mellitus (HCC) - Primary   Well-controlled diabetes with hemoglobin A1c of 5.7 Stopped Metformin , continues twice a day Humalog  insulin  15 units and daily Jardiance  10 mg Continue simvastatin  20 mg daily Diet and nutrition discussed      Relevant Orders   Hemoglobin A1c   Comprehensive metabolic panel with GFR   Lipid panel     Musculoskeletal and Integument   Neck muscle spasm   Active, present on physical examination, and affecting quality of life Recommend Flexeril  10 mg at bedtime Recommend heat pad frequently during the day May take Tylenol  as needed for pain Advised to contact the office if no better or worse during the next several days      Relevant Medications   cyclobenzaprine   (FLEXERIL ) 10 MG tablet   Trigger middle finger of right hand   Right hand middle finger. Recommend orthopedic evaluation Referral placed today      Relevant Orders   Ambulatory referral to Hand Surgery     Genitourinary   Prostate cancer (HCC)   Stable and well-controlled.      Bladder outlet obstruction   Secondary to prostate cancer Urinary symptoms well-controlled on tamsulosin 0.4 mg and Gemtesa 75 mg daily      Patient Instructions  Trigger Finger  Trigger finger, also called stenosing tenosynovitis, is a condition that causes a finger or thumb to get stuck in a bent position. Each finger has tough, cord-like tissue (tendon) that connects muscle to bone, and each tendon passes through a tunnel of tissue (tendon sheath). The tendon sheaths are held close to the bone by a pulley. There is a pulley called the A1 pulley that is involved in the triggering of a finger or thumb. To move your finger, your tendon needs to glide freely through the sheath. Trigger finger happens when the tendon or the sheath thickens, making it difficult to bend or straighten your finger as the thickened tendon gets stuck in the A1 pulley. Trigger finger can affect  any of the fingers or the thumbs. Mild cases may clear up with rest and medicine. Severe cases require more treatment. What are the causes? Trigger finger or thumb is caused by a thickened finger tendon or tendon sheath. The cause of this thickening is not known. What increases the risk? The following factors may make you more likely to develop this condition: Doing the same movements many times (repetitive activity) that require a strong grip. Having certain health conditions. These include rheumatoid arthritis, gout, carpal tunnel syndrome, or diabetes. Being 14-75 years old. Being male. What are the signs or symptoms? Symptoms of this condition include: Pain when bending or straightening your finger. Tenderness, swelling, or a lump in  the palm of your hand just below the finger joint. Hearing a noise like a pop or a snap when you try to straighten your finger. Feeling a catch or locked feeling when you try to straighten your finger. Being unable to straighten your finger without help from your other hand. How is this diagnosed? This condition is diagnosed based on your symptoms and a physical exam. How is this treated? This condition may be treated by: Resting your finger and avoiding activities that make symptoms worse. Wearing a finger splint to keep your finger extended. Taking NSAIDs, such as ibuprofen, to relieve pain and swelling around the tendon. Doing gentle exercises to stretch the finger as told by your health care provider. Having medicine that reduces swelling and inflammation (steroids) injected into the tendon sheath. Injections may need to be repeated. Trigger finger release. This surgery is done to open the pulley. This may be done if other treatments do not work and you cannot straighten or bend your finger. You may need hand therapy after surgery. Follow these instructions at home: If you have a removable splint: Wear the splint as told by your provider. Remove it only as told by your provider. Check the skin around the splint every day. Tell your provider about any concerns. Loosen the splint if your fingers tingle, become numb, or turn cold and blue. Keep the splint clean and dry. If the splint is not waterproof: Do not let it get wet. Cover it with a watertight covering when you take a bath or shower. Managing pain, stiffness, and swelling     If told, put ice on the painful area. If you have a removable splint, remove it as told by your health care provider. Put ice in a plastic bag. Place a towel between your skin and the bag or between your splint and the bag. Leave the ice on for 20 minutes, 2-3 times a day. If told, apply heat to the affected area as often as told by your provider. Use  the heat source that your provider recommends, such as a moist heat pack or a heating pad. Place a towel between your skin and the heat source. Leave the heat on for 20-30 minutes. If your skin turns bright red, remove the ice or heat right away to prevent skin damage. The risk of damage is higher if you cannot feel pain, heat, or cold. Activity Rest your finger as told by your provider. Avoid activities that make the pain worse. Return to your normal activities as told by your provider. Ask your provider what activities are safe for you. Do exercises as told by your provider. Ask your provider when it is safe to drive if you have a splint on your hand. General instructions Take over-the-counter and prescription medicines only as  told by your provider. Keep all follow-up visits. These are needed to see how you are progressing. Contact a health care provider if: Your symptoms are not improving with home care. This information is not intended to replace advice given to you by your health care provider. Make sure you discuss any questions you have with your health care provider. Document Revised: 11/01/2021 Document Reviewed: 11/01/2021 Elsevier Patient Education  2024 Elsevier Inc.     Maryagnes Small, MD [facility]

## 2023-07-28 NOTE — Assessment & Plan Note (Signed)
Secondary to prostate cancer Urinary symptoms well-controlled on tamsulosin 0.4 mg and Gemtesa 75 mg daily

## 2023-07-28 NOTE — Assessment & Plan Note (Signed)
 Stable chronic condition Continue Breztri  twice a day

## 2023-07-28 NOTE — Assessment & Plan Note (Signed)
 Continue simvastatin 20 mg daily

## 2023-07-28 NOTE — Assessment & Plan Note (Signed)
 Stable and well controlled.

## 2023-07-28 NOTE — Patient Instructions (Signed)
Trigger Finger  Trigger finger, also called stenosing tenosynovitis, is a condition that causes a finger or thumb to get stuck in a bent position. Each finger has tough, cord-like tissue (tendon) that connects muscle to bone, and each tendon passes through a tunnel of tissue (tendon sheath). The tendon sheaths are held close to the bone by a pulley. There is a pulley called the A1 pulley that is involved in the triggering of a finger or thumb. To move your finger, your tendon needs to glide freely through the sheath. Trigger finger happens when the tendon or the sheath thickens, making it difficult to bend or straighten your finger as the thickened tendon gets stuck in the A1 pulley. Trigger finger can affect any of the fingers or the thumbs. Mild cases may clear up with rest and medicine. Severe cases require more treatment. What are the causes? Trigger finger or thumb is caused by a thickened finger tendon or tendon sheath. The cause of this thickening is not known. What increases the risk? The following factors may make you more likely to develop this condition: Doing the same movements many times (repetitive activity) that require a strong grip. Having certain health conditions. These include rheumatoid arthritis, gout, carpal tunnel syndrome, or diabetes. Being 40-60 years old. Being male. What are the signs or symptoms? Symptoms of this condition include: Pain when bending or straightening your finger. Tenderness, swelling, or a lump in the palm of your hand just below the finger joint. Hearing a noise like a pop or a snap when you try to straighten your finger. Feeling a catch or locked feeling when you try to straighten your finger. Being unable to straighten your finger without help from your other hand. How is this diagnosed? This condition is diagnosed based on your symptoms and a physical exam. How is this treated? This condition may be treated by: Resting your finger and  avoiding activities that make symptoms worse. Wearing a finger splint to keep your finger extended. Taking NSAIDs, such as ibuprofen, to relieve pain and swelling around the tendon. Doing gentle exercises to stretch the finger as told by your health care provider. Having medicine that reduces swelling and inflammation (steroids) injected into the tendon sheath. Injections may need to be repeated. Trigger finger release. This surgery is done to open the pulley. This may be done if other treatments do not work and you cannot straighten or bend your finger. You may need hand therapy after surgery. Follow these instructions at home: If you have a removable splint: Wear the splint as told by your provider. Remove it only as told by your provider. Check the skin around the splint every day. Tell your provider about any concerns. Loosen the splint if your fingers tingle, become numb, or turn cold and blue. Keep the splint clean and dry. If the splint is not waterproof: Do not let it get wet. Cover it with a watertight covering when you take a bath or shower. Managing pain, stiffness, and swelling     If told, put ice on the painful area. If you have a removable splint, remove it as told by your health care provider. Put ice in a plastic bag. Place a towel between your skin and the bag or between your splint and the bag. Leave the ice on for 20 minutes, 2-3 times a day. If told, apply heat to the affected area as often as told by your provider. Use the heat source that your provider recommends, such as   a moist heat pack or a heating pad. Place a towel between your skin and the heat source. Leave the heat on for 20-30 minutes. If your skin turns bright red, remove the ice or heat right away to prevent skin damage. The risk of damage is higher if you cannot feel pain, heat, or cold. Activity Rest your finger as told by your provider. Avoid activities that make the pain worse. Return to your  normal activities as told by your provider. Ask your provider what activities are safe for you. Do exercises as told by your provider. Ask your provider when it is safe to drive if you have a splint on your hand. General instructions Take over-the-counter and prescription medicines only as told by your provider. Keep all follow-up visits. These are needed to see how you are progressing. Contact a health care provider if: Your symptoms are not improving with home care. This information is not intended to replace advice given to you by your health care provider. Make sure you discuss any questions you have with your health care provider. Document Revised: 11/01/2021 Document Reviewed: 11/01/2021 Elsevier Patient Education  2024 Elsevier Inc.  

## 2023-07-28 NOTE — Telephone Encounter (Signed)
 Patient brought in Advance Directive to today's visit, placed this form upfront for staff to scan into patients chart

## 2023-07-28 NOTE — Assessment & Plan Note (Signed)
 Active, present on physical examination, and affecting quality of life Recommend Flexeril 10 mg at bedtime Recommend heat pad frequently during the day May take Tylenol as needed for pain Advised to contact the office if no better or worse during the next several days

## 2023-07-28 NOTE — Assessment & Plan Note (Signed)
 Well-controlled diabetes with hemoglobin A1c of 5.7 Stopped Metformin , continues twice a day Humalog  insulin  15 units and daily Jardiance  10 mg Continue simvastatin  20 mg daily Diet and nutrition discussed

## 2023-07-28 NOTE — Assessment & Plan Note (Signed)
 Right hand middle finger. Recommend orthopedic evaluation Referral placed today

## 2023-07-30 DIAGNOSIS — E785 Hyperlipidemia, unspecified: Secondary | ICD-10-CM | POA: Diagnosis not present

## 2023-07-30 DIAGNOSIS — E1165 Type 2 diabetes mellitus with hyperglycemia: Secondary | ICD-10-CM | POA: Diagnosis not present

## 2023-08-04 ENCOUNTER — Other Ambulatory Visit: Payer: Self-pay

## 2023-08-04 ENCOUNTER — Ambulatory Visit
Admission: RE | Admit: 2023-08-04 | Discharge: 2023-08-04 | Disposition: A | Discharge status: Home / Self Care | Source: Ambulatory Visit | Attending: Family Medicine | Admitting: Family Medicine

## 2023-08-04 VITALS — BP 135/64 | HR 89 | Temp 98.3°F | Resp 18

## 2023-08-04 DIAGNOSIS — J0140 Acute pansinusitis, unspecified: Secondary | ICD-10-CM | POA: Diagnosis not present

## 2023-08-04 DIAGNOSIS — J209 Acute bronchitis, unspecified: Secondary | ICD-10-CM

## 2023-08-04 MED ORDER — PROMETHAZINE-DM 6.25-15 MG/5ML PO SYRP: 5.0000 mL | ORAL_SOLUTION | Freq: Every evening | ORAL | 0 refills | Status: DC | PRN

## 2023-08-04 MED ORDER — AMOXICILLIN 875 MG PO TABS: 875.0000 mg | ORAL_TABLET | Freq: Two times a day (BID) | ORAL | 0 refills | Status: DC

## 2023-08-04 MED ORDER — BENZONATATE 100 MG PO CAPS: 100.0000 mg | ORAL_CAPSULE | Freq: Three times a day (TID) | ORAL | 0 refills | Status: DC | PRN

## 2023-08-04 NOTE — ED Provider Notes (Signed)
 [facility]    CSN: [id number] Arrival date & time: 08/04/23  1340      History   Chief Complaint Chief Complaint  Patient presents with   Cough    HPI Douglas [name] is a 88 y.o. male.,  2 diabetes, hypertension and seasonal allergies.  Patient here for evaluation of cough, 1 week.  Patient has a history of seasonal allergies.  He is non-smoker.  Patient suffer from type 2 diabetes.  Patient endorses some chest discomfort with coughing, but no shortness of breath or wheezing.  He has been taken over-the-counter medication for sinus and allergy symptoms without improvement of symptoms.  No history of COPD however has had some reactive airway symptoms. Past Medical History:  Diagnosis Date   Adenomatous colon polyp 03/2001   Allergy    SEASONAL   Cancer (HCC)    Cataract    Dizziness    GERD (gastroesophageal reflux disease)    Hearing loss    Hyperlipidemia    Low testosterone      Patient Active Problem List   Diagnosis Date Noted   Trigger middle finger of right hand 07/28/2023   Neck muscle spasm 03/09/2023   Chronic cough 02/19/2023   Dyslipidemia associated with type 2 diabetes mellitus (HCC) 05/12/2022   Hyperglycemia 05/08/2022   Uncontrolled type 2 diabetes mellitus with hyperglycemia (HCC) 05/07/2022   Glaucoma 05/07/2022   Neuropathic pain of both feet 11/05/2021   Decreased hearing of left ear 11/05/2021   Simple chronic bronchitis (HCC) 06/18/2021   Diet-controlled diabetes mellitus (HCC) 09/20/2020   History of prostate cancer 09/20/2020   Atherosclerosis of aorta (HCC) 09/20/2020   B12 deficiency 12/16/2016   Bladder outlet obstruction 04/25/2015   Erectile dysfunction due to arterial insufficiency 04/25/2015   Osteoporosis 04/25/2015   Prostate cancer (HCC) 05/30/2011   Dyslipidemia 05/30/2011   Hypogonadism male 05/30/2011   Malignant neoplasm of prostate (HCC) 05/30/2011    Past Surgical History:  Procedure Laterality Date    APPENDECTOMY     COLONOSCOPY     EYE SURGERY     POLYPECTOMY     PROSTATE SURGERY     SPINE SURGERY         Home Medications    Prior to Admission medications   Medication Sig Start Date End Date Taking? Authorizing Provider  amoxicillin  (AMOXIL ) 875 MG tablet Take 1 tablet (875 mg total) by mouth 2 (two) times daily. 08/04/23  Yes Buena Carmine, NP  benzonatate  (TESSALON ) 100 MG capsule Take 1 capsule (100 mg total) by mouth 3 (three) times daily as needed for cough. 08/04/23  Yes Buena Carmine, NP  promethazine-dextromethorphan (PROMETHAZINE-DM) 6.25-15 MG/5ML syrup Take 5 mLs by mouth at bedtime as needed for cough. 08/04/23  Yes Buena Carmine, NP  acetaminophen  (TYLENOL ) 500 MG tablet Take 500-1,000 mg by mouth every 6 (six) hours as needed for mild pain or headache.    [provider]  albuterol  (VENTOLIN  HFA) 108 (90 Base) MCG/ACT inhaler TAKE 2 PUFFS BY MOUTH EVERY 6 HOURS AS NEEDED FOR WHEEZE OR SHORTNESS OF BREATH Strength: 108 (90 Base) MCG/ACT 01/27/23   Elvira Hammersmith, MD  alfuzosin  (UROXATRAL ) 10 MG 24 hr tablet Take 10 mg by mouth at bedtime.    [provider]  aspirin  EC 81 MG tablet Take 81 mg by mouth at bedtime. Swallow whole.    [provider]  blood glucose meter kit and supplies 1 each by Other route 3 (three) times  daily. 05/09/22   Leona Rake, MD  brimonidine  (ALPHAGAN ) 0.2 % ophthalmic solution Place 1 drop into the left eye daily.    [provider]  Budeson-Glycopyrrol-Formoterol (BREZTRI  AEROSPHERE) 160-9-4.8 MCG/ACT AERO Inhale 2 puffs into the lungs 2 (two) times daily. 02/19/23   Elvira Hammersmith, MD  cetirizine  (ZYRTEC ) 10 MG tablet Take 1 tablet (10 mg total) by mouth daily. 01/27/23   Elvira Hammersmith, MD  Cholecalciferol 125 MCG (5000 UT) TABS Take 1 tablet by mouth daily.    [provider]  cyclobenzaprine  (FLEXERIL ) 10 MG tablet Take 1 tablet (10 mg total) by mouth at bedtime as  needed for muscle spasms. TAKE 1 TABLET BY MOUTH EVERYDAY AT BEDTIME 07/28/23   [name], [name], MD  dorzolamide -timolol  (COSOPT ) 2-0.5 % ophthalmic solution Place 1 drop into both eyes 2 (two) times daily.    [provider]  famotidine  (PEPCID ) 40 MG tablet Take 1 tablet (40 mg total) by mouth at bedtime for 14 days. 02/19/23 03/05/23  Elvira Hammersmith, MD  Glucagon  (GVOKE HYPOPEN  2-PACK) 0.5 MG/0.1ML SOAJ Inject 0.5 mg into the skin as needed. 05/12/22   [name], [name], MD  Insulin  Lispro Prot & Lispro (HUMALOG  75/25 MIX) (75-25) 100 UNIT/ML Kwikpen INJECT 15 UNITS INTO THE SKIN 2 (TWO) TIMES DAILY. 05/07/23   Elvira Hammersmith, MD  Insulin  Pen Needle (B-D UF III MINI PEN NEEDLES) 31G X 5 MM MISC USE AS DIRECTED TWICE A DAY 06/09/23   Elvira Hammersmith, MD  JARDIANCE  10 MG TABS tablet TAKE 1 TABLET BY MOUTH EVERY DAY BEFORE BREAKFAST 02/25/23   [name], [name], MD  Lancets [facility] ULTRASOFT) lancets Use as instructed to check his blood sugars 3 times a day 05/12/22   Elvira Hammersmith, MD  latanoprost  (XALATAN ) 0.005 % ophthalmic solution Place 1 drop into both eyes at bedtime. 07/14/19   [provider]  linaclotide  (LINZESS ) 72 MCG capsule Take 1 capsule (72 mcg total) by mouth daily before breakfast. 07/15/22   Elvira Hammersmith, MD  Multiple Vitamin (MULTIVITAMIN) tablet Take 1 tablet by mouth at bedtime.    [provider]  [facility] VERIO test strip USE AS INSTRUCTED TO CHECK HIS BLOOD SUGARS 3 TIMES A DAY 07/20/23   Elvira Hammersmith, MD  pantoprazole  (PROTONIX ) 40 MG tablet TAKE 1 TABLET BY MOUTH EVERY DAY 02/26/23   Elvira Hammersmith, MD  RESTASIS  0.05 % ophthalmic emulsion Place 1 drop into both eyes 2 (two) times daily. 07/13/17   [provider]  RHOPRESSA  0.02 % SOLN Place 1 drop into both eyes at bedtime.    [provider]  simvastatin  (ZOCOR ) 20 MG tablet TAKE 1 TABLET DAILY AT 6PM. 01/03/23    Elvira Hammersmith, MD  tamsulosin (FLOMAX) 0.4 MG CAPS capsule Take 0.4 mg by mouth daily.    [provider]  venlafaxine (EFFEXOR) 37.5 MG tablet TAKE 1 TABLET BY MOUTH EVERY DAY 02/15/23   [name], [name], MD  Vibegron (GEMTESA) 75 MG TABS Take 75 tablets by mouth daily.    [provider]    Family History Family History  Problem Relation Age of Onset   Cancer Father 62       pancreatic   Other Mother        age 32   Prostate cancer Brother    Colon cancer Neg Hx    Esophageal cancer Neg Hx    Liver cancer Neg Hx    Pancreatic cancer  Neg Hx    Rectal cancer Neg Hx    Stomach cancer Neg Hx     Social History Social History   Tobacco Use   Smoking status: Never    Passive exposure: Never   Smokeless tobacco: Never  Vaping Use   Vaping status: Never Used  Substance Use Topics   Alcohol use: No    Alcohol/week: 0.0 standard drinks of alcohol   Drug use: No     Allergies   Patient has no known allergies.   Review of Systems Review of Systems  Respiratory:  Positive for cough.      Physical Exam Triage Vital Signs ED Triage Vitals [08/04/23 1356]  Encounter Vitals Group     BP 135/64     Systolic BP Percentile      Diastolic BP Percentile      Pulse Rate 89     Resp 18     Temp 98.3 F (36.8 C)     Temp Source Oral     SpO2 96 %     Weight      Height      Head Circumference      Peak Flow      Pain Score 3     Pain Loc      Pain Education      Exclude from Growth Chart    No data found.  Updated Vital Signs BP 135/64 (BP Location: Right Arm)   Pulse 89   Temp 98.3 F (36.8 C) (Oral)   Resp 18   SpO2 96%   Visual Acuity Right Eye Distance:   Left Eye Distance:   Bilateral Distance:    Right Eye Near:   Left Eye Near:    Bilateral Near:     Physical Exam Vitals reviewed.  Constitutional:      Appearance: Normal appearance.  [name]:     Head: Normocephalic and atraumatic.     Nose: Congestion and  rhinorrhea present.  Eyes:     Extraocular Movements: Extraocular movements intact.     Pupils: Pupils are equal, round, and reactive to light.  Cardiovascular:     Rate and Rhythm: Normal rate and regular rhythm.  Pulmonary:     Effort: Pulmonary effort is normal.     Breath sounds: Rhonchi present.  Musculoskeletal:        General: Normal range of motion.     Cervical back: Normal range of motion and neck supple.  Skin:    General: Skin is warm and dry.  Neurological:     General: No focal deficit present.     Mental Status: He is alert and oriented to person, place, and time.      UC Treatments / Results  Labs (all labs ordered are listed, but only abnormal results are displayed) Labs Reviewed - No data to display  EKG   Radiology No results found.  Procedures Procedures (including critical care time)  Medications Ordered in UC Medications - No data to display  Initial Impression / Assessment and Plan / UC Course  I have reviewed the triage vital signs and the nursing notes.  Pertinent labs & imaging results that were available during my care of the patient were reviewed by me and considered in my medical decision making (see chart for details).    Acute nonrecurrent pansinusitis with acute bronchitis treatment with amoxicillin  875 twice daily for 10 days.  Promethazine DM for cough.  Turn precautions given if symptoms do not  improve or if symptoms worsen to completing treatment. Final Clinical Impressions(s) / UC Diagnoses   Final diagnoses:  Acute non-recurrent pansinusitis  Acute bronchitis, unspecified organism     Discharge Instructions      Take all medication as prescribed.  Follow-up with PCP or return for evaluation if symptoms worsen or do not improve.     ED Prescriptions     Medication Sig Dispense Auth. Provider   promethazine-dextromethorphan (PROMETHAZINE-DM) 6.25-15 MG/5ML syrup Take 5 mLs by mouth at bedtime as needed for cough. 118  mL Buena Carmine, NP   benzonatate  (TESSALON ) 100 MG capsule Take 1 capsule (100 mg total) by mouth 3 (three) times daily as needed for cough. 21 capsule Buena Carmine, NP   amoxicillin  (AMOXIL ) 875 MG tablet Take 1 tablet (875 mg total) by mouth 2 (two) times daily. 20 tablet Buena Carmine, NP      PDMP not reviewed this encounter.   Buena Carmine, NP 08/04/23 [phone]

## 2023-08-04 NOTE — Discharge Instructions (Signed)
 Take all medication as prescribed.  Follow-up with PCP or return for evaluation if symptoms worsen or do not improve.

## 2023-08-04 NOTE — ED Triage Notes (Signed)
 Pt here for cough x 1 week with pain with cough

## 2023-08-06 DIAGNOSIS — E559 Vitamin D deficiency, unspecified: Secondary | ICD-10-CM | POA: Diagnosis not present

## 2023-08-06 DIAGNOSIS — E785 Hyperlipidemia, unspecified: Secondary | ICD-10-CM | POA: Diagnosis not present

## 2023-08-06 DIAGNOSIS — G629 Polyneuropathy, unspecified: Secondary | ICD-10-CM | POA: Diagnosis not present

## 2023-08-06 DIAGNOSIS — Z794 Long term (current) use of insulin: Secondary | ICD-10-CM | POA: Diagnosis not present

## 2023-08-06 DIAGNOSIS — Z8739 Personal history of other diseases of the musculoskeletal system and connective tissue: Secondary | ICD-10-CM | POA: Diagnosis not present

## 2023-08-06 DIAGNOSIS — E1165 Type 2 diabetes mellitus with hyperglycemia: Secondary | ICD-10-CM | POA: Diagnosis not present

## 2023-08-24 ENCOUNTER — Other Ambulatory Visit: Payer: Self-pay | Admitting: Emergency Medicine

## 2023-08-24 DIAGNOSIS — E1165 Type 2 diabetes mellitus with hyperglycemia: Secondary | ICD-10-CM

## 2023-09-01 ENCOUNTER — Telehealth: Payer: Self-pay | Admitting: Emergency Medicine

## 2023-09-01 DIAGNOSIS — R059 Cough, unspecified: Secondary | ICD-10-CM

## 2023-09-01 DIAGNOSIS — E785 Hyperlipidemia, unspecified: Secondary | ICD-10-CM

## 2023-09-01 NOTE — Telephone Encounter (Unsigned)
 Copied from CRM [id number]. Topic: Clinical - Medication Refill >> Sep 01, 2023 10:26 AM Allyne Areola wrote: Medication: simvastatin  (ZOCOR ) 20 MG tablet & albuterol  (VENTOLIN  HFA) 108 (90 Base) MCG/ACT inhaler  Has the patient contacted their pharmacy? Yes, they asked patient to follow up with his primary care provider. (Agent: If no, request that the patient contact the pharmacy for the refill. If patient does not wish to contact the pharmacy document the reason why and proceed with request.) (Agent: If yes, when and what did the pharmacy advise?)  This is the patient's preferred pharmacy:  [id number] [place], [place] - [address] AT [address] [place] [address] Phone: [phone] Fax: [phone]  Is this the correct pharmacy for this prescription? Yes If no, delete pharmacy and type the correct one.   Has the prescription been filled recently? Yes  Is the patient out of the medication? Yes  Has the patient been seen for an appointment in the last year OR does the patient have an upcoming appointment? Yes  Can we respond through MyChart? Yes  Agent: Please be advised that Rx refills may take up to 3 business days. We ask that you follow-up with your pharmacy.

## 2023-09-04 MED ORDER — SIMVASTATIN 20 MG PO TABS
ORAL_TABLET | ORAL | 1 refills | Status: DC
Start: 2023-09-04 — End: 2024-02-15

## 2023-09-04 MED ORDER — ALBUTEROL SULFATE HFA 108 (90 BASE) MCG/ACT IN AERS: INHALATION_SPRAY | RESPIRATORY_TRACT | 3 refills | Status: AC

## 2023-09-15 ENCOUNTER — Ambulatory Visit: Admitting: Orthopedic Surgery

## 2023-09-22 ENCOUNTER — Encounter: Payer: Self-pay | Admitting: Emergency Medicine

## 2023-09-22 ENCOUNTER — Ambulatory Visit (INDEPENDENT_AMBULATORY_CARE_PROVIDER_SITE_OTHER): Admitting: Emergency Medicine

## 2023-09-22 VITALS — BP 102/58 | HR 66 | Temp 98.3°F | Ht 71.0 in | Wt 181.0 lb

## 2023-09-22 DIAGNOSIS — E1169 Type 2 diabetes mellitus with other specified complication: Secondary | ICD-10-CM | POA: Diagnosis not present

## 2023-09-22 DIAGNOSIS — Z7984 Long term (current) use of oral hypoglycemic drugs: Secondary | ICD-10-CM | POA: Diagnosis not present

## 2023-09-22 DIAGNOSIS — J410 Simple chronic bronchitis: Secondary | ICD-10-CM | POA: Diagnosis not present

## 2023-09-22 DIAGNOSIS — I700 Atherosclerosis of aorta: Secondary | ICD-10-CM

## 2023-09-22 DIAGNOSIS — E785 Hyperlipidemia, unspecified: Secondary | ICD-10-CM

## 2023-09-22 DIAGNOSIS — C61 Malignant neoplasm of prostate: Secondary | ICD-10-CM | POA: Diagnosis not present

## 2023-09-22 NOTE — Patient Instructions (Signed)
 Health Maintenance After Age 86 After age 4, you are at a higher risk for certain long-term diseases and infections as well as injuries from falls. Falls are a major cause of broken bones and head injuries in people who are older than age 47. Getting regular preventive care can help to keep you healthy and well. Preventive care includes getting regular testing and making lifestyle changes as recommended by your health care provider. Talk with your health care provider about: Which screenings and tests you should have. A screening is a test that checks for a disease when you have no symptoms. A diet and exercise plan that is right for you. What should I know about screenings and tests to prevent falls? Screening and testing are the best ways to find a health problem early. Early diagnosis and treatment give you the best chance of managing medical conditions that are common after age 37. Certain conditions and lifestyle choices may make you more likely to have a fall. Your health care provider may recommend: Regular vision checks. Poor vision and conditions such as cataracts can make you more likely to have a fall. If you wear glasses, make sure to get your prescription updated if your vision changes. Medicine review. Work with your health care provider to regularly review all of the medicines you are taking, including over-the-counter medicines. Ask your health care provider about any side effects that may make you more likely to have a fall. Tell your health care provider if any medicines that you take make you feel dizzy or sleepy. Strength and balance checks. Your health care provider may recommend certain tests to check your strength and balance while standing, walking, or changing positions. Foot health exam. Foot pain and numbness, as well as not wearing proper footwear, can make you more likely to have a fall. Screenings, including: Osteoporosis screening. Osteoporosis is a condition that causes  the bones to get weaker and break more easily. Blood pressure screening. Blood pressure changes and medicines to control blood pressure can make you feel dizzy. Depression screening. You may be more likely to have a fall if you have a fear of falling, feel depressed, or feel unable to do activities that you used to do. Alcohol use screening. Using too much alcohol can affect your balance and may make you more likely to have a fall. Follow these instructions at home: Lifestyle Do not drink alcohol if: Your health care provider tells you not to drink. If you drink alcohol: Limit how much you have to: 0-1 drink a day for women. 0-2 drinks a day for men. Know how much alcohol is in your drink. In the [place] one drink equals one 12 oz bottle of beer (355 mL), one 5 oz glass of wine (148 mL), or one 1 oz glass of hard liquor (44 mL). Do not use any products that contain nicotine or tobacco. These products include cigarettes, chewing tobacco, and vaping devices, such as e-cigarettes. If you need help quitting, ask your health care provider. Activity  Follow a regular exercise program to stay fit. This will help you maintain your balance. Ask your health care provider what types of exercise are appropriate for you. If you need a cane or walker, use it as recommended by your health care provider. Wear supportive shoes that have nonskid soles. Safety  Remove any tripping hazards, such as rugs, cords, and clutter. Install safety equipment such as grab bars in bathrooms and safety rails on stairs. Keep rooms and walkways  well-lit. General instructions Talk with your health care provider about your risks for falling. Tell your health care provider if: You fall. Be sure to tell your health care provider about all falls, even ones that seem minor. You feel dizzy, tiredness (fatigue), or off-balance. Take over-the-counter and prescription medicines only as told by your health care provider. These include  supplements. Eat a healthy diet and maintain a healthy weight. A healthy diet includes low-fat dairy products, low-fat (lean) meats, and fiber from whole grains, beans, and lots of fruits and vegetables. Stay current with your vaccines. Schedule regular health, dental, and eye exams. Summary Having a healthy lifestyle and getting preventive care can help to protect your health and wellness after age 11. Screening and testing are the best way to find a health problem early and help you avoid having a fall. Early diagnosis and treatment give you the best chance for managing medical conditions that are more common for people who are older than age 28. Falls are a major cause of broken bones and head injuries in people who are older than age 48. Take precautions to prevent a fall at home. Work with your health care provider to learn what changes you can make to improve your health and wellness and to prevent falls. This information is not intended to replace advice given to you by your health care provider. Make sure you discuss any questions you have with your health care provider. Document Revised: 08/06/2020 Document Reviewed: 08/06/2020 Elsevier Patient Education  2024 [employer].

## 2023-09-22 NOTE — Assessment & Plan Note (Signed)
 Continue simvastatin 20 mg daily

## 2023-09-22 NOTE — Assessment & Plan Note (Signed)
 Stable and well controlled.

## 2023-09-22 NOTE — Assessment & Plan Note (Signed)
 Well-controlled diabetes with hemoglobin A1c of 5.7 Stopped Metformin , continues twice a day Humalog  insulin  15 units and daily Jardiance  10 mg Continue simvastatin  20 mg daily Diet and nutrition discussed

## 2023-09-22 NOTE — Progress Notes (Signed)
 Douglas [name] 88 y.o.   Chief Complaint  Patient presents with  . Follow-up    Patient states he's here because of this cold he's had for couple months that hasn't gone away. No fever, no headache, scratchy throat, stuffy nose that comes and goes. He is not taking anything for this only allergies meds daily.    HISTORY OF PRESENT ILLNESS: This is a 88 y.o. male here for follow-up of multiple chronic medical conditions Also complaining of a cold on and off for several months. No other complaints or medical concerns today. Lab Results  Component Value Date   HGBA1C 6.6 (H) 07/28/2023     HPI   Prior to Admission medications   Medication Sig Start Date End Date Taking? Authorizing Provider  acetaminophen  (TYLENOL ) 500 MG tablet Take 500-1,000 mg by mouth every 6 (six) hours as needed for mild pain or headache.   Yes [provider]  albuterol  (VENTOLIN  HFA) 108 (90 Base) MCG/ACT inhaler TAKE 2 PUFFS BY MOUTH EVERY 6 HOURS AS NEEDED FOR WHEEZE OR SHORTNESS OF BREATH Strength: 108 (90 Base) MCG/ACT 09/04/23  Yes Kinneth Fujiwara, Douglas Schanz, MD  blood glucose meter kit and supplies 1 each by Other route 3 (three) times daily. 05/09/22  Yes Jillian Buttery, MD  brimonidine  (ALPHAGAN ) 0.2 % ophthalmic solution Place 1 drop into the left eye daily.   Yes [provider]  cetirizine  (ZYRTEC ) 10 MG tablet Take 1 tablet (10 mg total) by mouth daily. 01/27/23  Yes Aaira Oestreicher, Douglas Schanz, MD  dorzolamide -timolol  (COSOPT ) 2-0.5 % ophthalmic solution Place 1 drop into both eyes 2 (two) times daily.   Yes [provider]  Glucagon  (GVOKE HYPOPEN  2-PACK) 0.5 MG/0.1ML SOAJ Inject 0.5 mg into the skin as needed. 05/12/22  Yes Secilia Apps, Douglas Schanz, MD  Insulin  Lispro Prot & Lispro (HUMALOG  75/25 MIX) (75-25) 100 UNIT/ML Kwikpen INJECT 15 UNITS INTO THE SKIN 2 (TWO) TIMES DAILY. 05/07/23  Yes Aubryana Vittorio, Douglas Schanz, MD  Insulin  Pen Needle (B-D UF III MINI PEN NEEDLES) 31G X 5 MM MISC USE AS  DIRECTED TWICE A DAY 06/09/23  Yes Ader Fritze, Douglas Schanz, MD  JARDIANCE  10 MG TABS tablet TAKE 1 TABLET BY MOUTH EVERY DAY BEFORE BREAKFAST 08/24/23  Yes Shevelle Smither, Douglas Schanz, MD  Lancets [facility] ULTRASOFT) lancets Use as instructed to check his blood sugars 3 times a day 05/12/22  Yes Dorell Gatlin, Douglas Schanz, MD  latanoprost  (XALATAN ) 0.005 % ophthalmic solution Place 1 drop into both eyes at bedtime. 07/14/19  Yes [provider]  Multiple Vitamin (MULTIVITAMIN) tablet Take 1 tablet by mouth at bedtime.   Yes [provider]  ONETOUCH VERIO test strip USE AS INSTRUCTED TO CHECK HIS BLOOD SUGARS 3 TIMES A DAY 07/20/23  Yes Toua Stites, Douglas Schanz, MD  pantoprazole  (PROTONIX ) 40 MG tablet TAKE 1 TABLET BY MOUTH EVERY DAY 02/26/23  Yes Delbert Darley, Douglas Schanz, MD  RESTASIS  0.05 % ophthalmic emulsion Place 1 drop into both eyes 2 (two) times daily. 07/13/17  Yes [provider]  RHOPRESSA  0.02 % SOLN Place 1 drop into both eyes at bedtime.   Yes [provider]  simvastatin  (ZOCOR ) 20 MG tablet TAKE 1 TABLET DAILY AT 6PM. 09/04/23  Yes Alleyah Twombly, Douglas Schanz, MD  tamsulosin (FLOMAX) 0.4 MG CAPS capsule Take 0.4 mg by mouth daily.   Yes [provider]  Vibegron (GEMTESA) 75 MG TABS Take 75 tablets by mouth daily.   Yes [provider]  alfuzosin  (UROXATRAL ) 10 MG 24 hr  tablet Take 10 mg by mouth at bedtime. Patient not taking: Reported on 09/22/2023    [provider]  amoxicillin  (AMOXIL ) 875 MG tablet Take 1 tablet (875 mg total) by mouth 2 (two) times daily. Patient not taking: Reported on 09/22/2023 08/04/23   Arloa Suzen RAMAN, NP  aspirin  EC 81 MG tablet Take 81 mg by mouth at bedtime. Swallow whole. Patient not taking: Reported on 09/22/2023    [provider]  benzonatate  (TESSALON ) 100 MG capsule Take 1 capsule (100 mg total) by mouth 3 (three) times daily as needed for cough. Patient not taking: Reported on 09/22/2023 08/04/23   Arloa Suzen RAMAN, NP  Budeson-Glycopyrrol-Formoterol (BREZTRI  AEROSPHERE) 160-9-4.8 MCG/ACT AERO Inhale 2 puffs into the lungs 2 (two) times daily. Patient not taking: Reported on 09/22/2023 02/19/23   Purcell Douglas Schanz, MD  Cholecalciferol 125 MCG (5000 UT) TABS Take 1 tablet by mouth daily. Patient not taking: Reported on 09/22/2023    [provider]  cyclobenzaprine  (FLEXERIL ) 10 MG tablet Take 1 tablet (10 mg total) by mouth at bedtime as needed for muscle spasms. TAKE 1 TABLET BY MOUTH EVERYDAY AT BEDTIME Patient not taking: Reported on 09/22/2023 07/28/23   Faiga Stones Jose, MD  famotidine  (PEPCID ) 40 MG tablet Take 1 tablet (40 mg total) by mouth at bedtime for 14 days. Patient not taking: Reported on 09/22/2023 02/19/23 03/05/23  Purcell Douglas Schanz, MD  linaclotide  (LINZESS ) 72 MCG capsule Take 1 capsule (72 mcg total) by mouth daily before breakfast. Patient not taking: Reported on 09/22/2023 07/15/22   Suzan Manon Jose, MD  promethazine -dextromethorphan (PROMETHAZINE -DM) 6.25-15 MG/5ML syrup Take 5 mLs by mouth at bedtime as needed for cough. Patient not taking: Reported on 09/22/2023 08/04/23   Arloa Suzen RAMAN, NP  venlafaxine [facility]) 37.5 MG tablet TAKE 1 TABLET BY MOUTH EVERY DAY Patient not taking: Reported on 09/22/2023 02/15/23   Khiree Bukhari Jose, MD    No Known Allergies  Patient Active Problem List   Diagnosis Date Noted  . Trigger middle finger of right hand 07/28/2023  . Neck muscle spasm 03/09/2023  . Chronic cough 02/19/2023  . Dyslipidemia associated with type 2 diabetes mellitus (HCC) 05/12/2022  . Hyperglycemia 05/08/2022  . Uncontrolled type 2 diabetes mellitus with hyperglycemia (HCC) 05/07/2022  . Glaucoma 05/07/2022  . Neuropathic pain of both feet 11/05/2021  . Decreased hearing of left ear 11/05/2021  . Simple chronic bronchitis (HCC) 06/18/2021  . Diet-controlled diabetes mellitus (HCC) 09/20/2020  . History of prostate cancer  09/20/2020  . Atherosclerosis of aorta (HCC) 09/20/2020  . B12 deficiency 12/16/2016  . Bladder outlet obstruction 04/25/2015  . Erectile dysfunction due to arterial insufficiency 04/25/2015  . Osteoporosis 04/25/2015  . Prostate cancer (HCC) 05/30/2011  . Dyslipidemia 05/30/2011  . Hypogonadism male 05/30/2011  . Malignant neoplasm of prostate (HCC) 05/30/2011    Past Medical History:  Diagnosis Date  . Adenomatous colon polyp 03/2001  . Allergy    SEASONAL  . Cancer (HCC)   . Cataract   . Dizziness   . GERD (gastroesophageal reflux disease)   . Hearing loss   . Hyperlipidemia   . Low testosterone      Past Surgical History:  Procedure Laterality Date  . APPENDECTOMY    . COLONOSCOPY    . EYE SURGERY    . POLYPECTOMY    . PROSTATE SURGERY    . SPINE SURGERY      Social History   Socioeconomic History  . Marital status: Married  Spouse name: Not on file  . Number of children: 3  . Years of education: 2 years [employer]  . Highest education level: Not on file  Occupational History  . Occupation: RETIRED  Tobacco Use  . Smoking status: Never    Passive exposure: Never  . Smokeless tobacco: Never  Vaping Use  . Vaping status: Never Used  Substance and Sexual Activity  . Alcohol use: No    Alcohol/week: 0.0 standard drinks of alcohol  . Drug use: No  . Sexual activity: Yes  Other Topics Concern  . Not on file  Social History Narrative   Married. Education: [employer]. Exercise: Yes.   Right-handed.   Drinks coffee daily.   Lives at home with his wife.   Social Drivers of Corporate investment banker Strain: Low Risk  (07/20/2023)   Overall Financial Resource Strain (CARDIA)   . Difficulty of Paying Living Expenses: Not hard at all  Food Insecurity: No Food Insecurity (07/20/2023)   Hunger Vital Sign   . Worried About Programme researcher, broadcasting/film/video in the Last Year: Never true   . Ran Out of Food in the Last Year: Never true  Transportation Needs: No Transportation Needs  (07/20/2023)   PRAPARE - Transportation   . Lack of Transportation (Medical): No   . Lack of Transportation (Non-Medical): No  Physical Activity: Sufficiently Active (07/20/2023)   Exercise Vital Sign   . Days of Exercise per Week: 4 days   . Minutes of Exercise per Session: 50 min  Stress: No Stress Concern Present (07/20/2023)   [employer] of Occupational Health - Occupational Stress Questionnaire   . Feeling of Stress : Not at all  Social Connections: Moderately Isolated (07/20/2023)   Social Connection and Isolation Panel   . Frequency of Communication with Friends and Family: More than three times a week   . Frequency of Social Gatherings with Friends and Family: Once a week   . Attends Religious Services: Never   . Active Member of Clubs or Organizations: No   . Attends Banker Meetings: Never   . Marital Status: Married  Catering manager Violence: Not At Risk (07/20/2023)   Humiliation, Afraid, Rape, and Kick questionnaire   . Fear of Current or Ex-Partner: No   . Emotionally Abused: No   . Physically Abused: No   . Sexually Abused: No    Family History  Problem Relation Age of Onset  . Cancer Father 22       pancreatic  . Other Mother        age 25  . Prostate cancer Brother   . Colon cancer Neg Hx   . Esophageal cancer Neg Hx   . Liver cancer Neg Hx   . Pancreatic cancer Neg Hx   . Rectal cancer Neg Hx   . Stomach cancer Neg Hx      Review of Systems  Constitutional: Negative.  Negative for chills and fever.  [name]:  Negative for congestion and sore throat.   Respiratory:  Positive for cough.   Cardiovascular: Negative.  Negative for chest pain.  Gastrointestinal:  Negative for abdominal pain, diarrhea, nausea and vomiting.  Genitourinary: Negative.  Negative for dysuria and hematuria.  Skin: Negative.  Negative for rash.  Neurological: Negative.  Negative for dizziness and headaches.  All other systems reviewed and are negative.   Vitals:    09/22/23 1028  BP: (!) 102/58  Pulse: 66  Temp: 98.3 F (36.8 C)  SpO2: 96%  Physical Exam Vitals reviewed.  Constitutional:      Appearance: Normal appearance.  [name]:     Head: Normocephalic.     Mouth/Throat:     Mouth: Mucous membranes are moist.     Pharynx: Oropharynx is clear.   Eyes:     Extraocular Movements: Extraocular movements intact.     Pupils: Pupils are equal, round, and reactive to light.    Cardiovascular:     Rate and Rhythm: Normal rate and regular rhythm.     Pulses: Normal pulses.     Heart sounds: Normal heart sounds.  Pulmonary:     Effort: Pulmonary effort is normal.     Breath sounds: Normal breath sounds.   Musculoskeletal:     Cervical back: No tenderness.  Lymphadenopathy:     Cervical: No cervical adenopathy.   Skin:    General: Skin is warm and dry.   Neurological:     Mental Status: He is alert and oriented to person, place, and time.   Psychiatric:        Mood and Affect: Mood normal.        Behavior: Behavior normal.     ASSESSMENT & PLAN: A total of 46 minutes was spent with the patient and counseling/coordination of care regarding preparing for this visit, review of most recent office visit notes, review of multiple chronic medical conditions and their management, review of all medications, review of most recent bloodwork results, review of health maintenance items, education on nutrition, prognosis, documentation, and need for follow up.    Problem List Items Addressed This Visit       Cardiovascular and Mediastinum   Atherosclerosis of aorta (HCC)   Continue simvastatin  20 mg daily        Respiratory   Simple chronic bronchitis (HCC)   Stable chronic condition with chronic symptoms including chronic cough.  No active infection.  No complications. Continue Breztri  twice a day        Endocrine   Dyslipidemia associated with type 2 diabetes mellitus (HCC) - Primary   Well-controlled diabetes with hemoglobin  A1c of 5.7 Stopped Metformin , continues twice a day Humalog  insulin  15 units and daily Jardiance  10 mg Continue simvastatin  20 mg daily Diet and nutrition discussed        Genitourinary   Prostate cancer (HCC)   Stable and well-controlled.        Patient Instructions  Health Maintenance After Age 45 After age 13, you are at a higher risk for certain long-term diseases and infections as well as injuries from falls. Falls are a major cause of broken bones and head injuries in people who are older than age 66. Getting regular preventive care can help to keep you healthy and well. Preventive care includes getting regular testing and making lifestyle changes as recommended by your health care provider. Talk with your health care provider about: Which screenings and tests you should have. A screening is a test that checks for a disease when you have no symptoms. A diet and exercise plan that is right for you. What should I know about screenings and tests to prevent falls? Screening and testing are the best ways to find a health problem early. Early diagnosis and treatment give you the best chance of managing medical conditions that are common after age 81. Certain conditions and lifestyle choices may make you more likely to have a fall. Your health care provider may recommend: Regular vision checks. Poor vision and conditions such as  cataracts can make you more likely to have a fall. If you wear glasses, make sure to get your prescription updated if your vision changes. Medicine review. Work with your health care provider to regularly review all of the medicines you are taking, including over-the-counter medicines. Ask your health care provider about any side effects that may make you more likely to have a fall. Tell your health care provider if any medicines that you take make you feel dizzy or sleepy. Strength and balance checks. Your health care provider may recommend certain tests to check your  strength and balance while standing, walking, or changing positions. Foot health exam. Foot pain and numbness, as well as not wearing proper footwear, can make you more likely to have a fall. Screenings, including: Osteoporosis screening. Osteoporosis is a condition that causes the bones to get weaker and break more easily. Blood pressure screening. Blood pressure changes and medicines to control blood pressure can make you feel dizzy. Depression screening. You may be more likely to have a fall if you have a fear of falling, feel depressed, or feel unable to do activities that you used to do. Alcohol use screening. Using too much alcohol can affect your balance and may make you more likely to have a fall. Follow these instructions at home: Lifestyle Do not drink alcohol if: Your health care provider tells you not to drink. If you drink alcohol: Limit how much you have to: 0-1 drink a day for women. 0-2 drinks a day for men. Know how much alcohol is in your drink. In the [place] one drink equals one 12 oz bottle of beer (355 mL), one 5 oz glass of wine (148 mL), or one 1 oz glass of hard liquor (44 mL). Do not use any products that contain nicotine or tobacco. These products include cigarettes, chewing tobacco, and vaping devices, such as e-cigarettes. If you need help quitting, ask your health care provider. Activity  Follow a regular exercise program to stay fit. This will help you maintain your balance. Ask your health care provider what types of exercise are appropriate for you. If you need a cane or walker, use it as recommended by your health care provider. Wear supportive shoes that have nonskid soles. Safety  Remove any tripping hazards, such as rugs, cords, and clutter. Install safety equipment such as grab bars in bathrooms and safety rails on stairs. Keep rooms and walkways well-lit. General instructions Talk with your health care provider about your risks for falling. Tell your  health care provider if: You fall. Be sure to tell your health care provider about all falls, even ones that seem minor. You feel dizzy, tiredness (fatigue), or off-balance. Take over-the-counter and prescription medicines only as told by your health care provider. These include supplements. Eat a healthy diet and maintain a healthy weight. A healthy diet includes low-fat dairy products, low-fat (lean) meats, and fiber from whole grains, beans, and lots of fruits and vegetables. Stay current with your vaccines. Schedule regular health, dental, and eye exams. Summary Having a healthy lifestyle and getting preventive care can help to protect your health and wellness after age 27. Screening and testing are the best way to find a health problem early and help you avoid having a fall. Early diagnosis and treatment give you the best chance for managing medical conditions that are more common for people who are older than age 68. Falls are a major cause of broken bones and head injuries in people who are  older than age 60. Take precautions to prevent a fall at home. Work with your health care provider to learn what changes you can make to improve your health and wellness and to prevent falls. This information is not intended to replace advice given to you by your health care provider. Make sure you discuss any questions you have with your health care provider. Document Revised: 08/06/2020 Document Reviewed: 08/06/2020 Elsevier Patient Education  2024 Elsevier Inc.     Douglas Schaumann, MD [facility]

## 2023-09-22 NOTE — Assessment & Plan Note (Addendum)
 Stable chronic condition with chronic symptoms including chronic cough.  No active infection.  No complications. Continue Breztri  twice a day

## 2023-09-28 DIAGNOSIS — H401111 Primary open-angle glaucoma, right eye, mild stage: Secondary | ICD-10-CM | POA: Diagnosis not present

## 2023-09-28 DIAGNOSIS — H401122 Primary open-angle glaucoma, left eye, moderate stage: Secondary | ICD-10-CM | POA: Diagnosis not present

## 2023-10-05 ENCOUNTER — Encounter: Payer: Self-pay | Admitting: Emergency Medicine

## 2023-10-05 DIAGNOSIS — C61 Malignant neoplasm of prostate: Secondary | ICD-10-CM | POA: Diagnosis not present

## 2023-10-06 ENCOUNTER — Other Ambulatory Visit (INDEPENDENT_AMBULATORY_CARE_PROVIDER_SITE_OTHER): Payer: Self-pay

## 2023-10-06 ENCOUNTER — Ambulatory Visit (INDEPENDENT_AMBULATORY_CARE_PROVIDER_SITE_OTHER): Admitting: Orthopedic Surgery

## 2023-10-06 DIAGNOSIS — M65331 Trigger finger, right middle finger: Secondary | ICD-10-CM

## 2023-10-06 DIAGNOSIS — M79641 Pain in right hand: Secondary | ICD-10-CM

## 2023-10-06 MED ORDER — LIDOCAINE HCL 1 % IJ SOLN
1.0000 mL | INTRAMUSCULAR | Status: AC | PRN
  Administered 2023-10-06: 1 mL

## 2023-10-06 MED ORDER — BETAMETHASONE SOD PHOS & ACET 6 (3-3) MG/ML IJ SUSP
6.0000 mg | INTRAMUSCULAR | Status: AC | PRN
  Administered 2023-10-06: 6 mg via INTRA_ARTICULAR

## 2023-10-06 NOTE — Progress Notes (Signed)
 Douglas [name] - 88 y.o. male MRN [id number]  Date of birth: [birth year]  Office Visit Note: Visit Date: 10/06/2023 PCP: Purcell Emil Schanz, MD Referred by: Purcell Emil Schanz, *  Subjective: No chief complaint on file.  HPI: Douglas [name] is a pleasant 88 y.o. male who presents today for evaluation of right long finger clicking and locking that has been present now for 4 months.  States that he has clicking and locking on a regular basis, often daily, occasionally requiring manual correction.  Has not undergone any prior treatments for this.  He is diabetic, well-controlled, recent A1c is 6.6 from April of this year.  Pertinent ROS were reviewed with the patient and found to be negative unless otherwise specified above in HPI.   Visit Reason:Right long finger trigger digit Duration of symptoms: 4months Hand dominance: right Occupation: Diabetic: Yes A1c, 6.6 07/28/23 Smoking: No Heart/Lung History: none Blood Thinners: none  Prior Testing/EMG: none Injections (Date): none Treatments:none Prior Surgery: none  Assessment & Plan: Visit Diagnoses:  1. Trigger finger, right middle finger   2. Pain in right hand     Plan: Extensive discussion was had with the patient today regarding his right long finger trigger digit.  We discussed the etiology and pathophysiology of stenosing tenosynovitis.  We discussed conservative versus surgical treatment modalities.  From a conservative standpoint, we discussed activity modification, splinting, therapy and injections.  From a surgical standpoint, we discussed the possibility for trigger digit release as well as all risk and benefits associated.  Given that he has not trialed conservative treatments, patient is appropriate candidate for cortisone injection to the right long finger A1 pulley for symptom relief.  Risks and benefit of the cortisone injection were discussed in detail, patient agreed to proceed.  Injection was provided today  without issue, patient will return in approximate 6 weeks time for a recheck.  I did also explain him to keep a close eye on his glucose control over the next few days after injection.  Follow-up: No follow-ups on file.   Meds & Orders: No orders of the defined types were placed in this encounter.   Orders Placed This Encounter  Procedures   Hand/UE Inj   XR Hand Complete Right     Procedures: Hand/UE Inj: R long A1 for trigger finger on 10/06/2023 9:25 PM Indications: pain Details: 25 G needle, volar approach Medications: 1 mL lidocaine  1 %; 6 mg betamethasone  acetate-betamethasone  sodium phosphate 6 (3-3) MG/ML Outcome: tolerated well, no immediate complications Procedure, treatment alternatives, risks and benefits explained, specific risks discussed. Consent was given by the patient. Patient was prepped and draped in the usual sterile fashion.          Clinical History: No specialty comments available.  He reports that he has never smoked. He has never been exposed to tobacco smoke. He has never used smokeless tobacco.  Recent Labs    01/27/23 1052 07/28/23 1156  HGBA1C 5.7* 6.6*    Objective:   Vital Signs: There were no vitals taken for this visit.  Physical Exam  Gen: Well-appearing, in no acute distress; non-toxic CV: Regular Rate. Well-perfused. Warm.  Resp: Breathing unlabored on room air; no wheezing. Psych: Fluid speech in conversation; appropriate affect; normal thought process  Ortho Exam Right hand: - Palpable nodule at the A1 pulley of the long finger, associated tenderness - Notable clicking with deep flexion of the long finger, there is evidence of significant locking with deep flexion - Sensation  intact distally, hand remains warm well-perfused   Imaging: XR Hand Complete Right Result Date: 10/06/2023 X-rays of the right hand demonstrate mild bone demineralization diffusely.  There is evidence of some degenerative changes at the small joints,  particular the DIPs diffusely.  Previous metallic clips visualized metacarpal region.  Evidence of some joint space narrowing at the distal radioulnar joint with mild ulnar positivity, no clear-cut impaction.   Past Medical/Family/Surgical/Social History: Medications & Allergies reviewed per EMR, new medications updated. Patient Active Problem List   Diagnosis Date Noted   Trigger middle finger of right hand 07/28/2023   Neck muscle spasm 03/09/2023   Chronic cough 02/19/2023   Dyslipidemia associated with type 2 diabetes mellitus (HCC) 05/12/2022   Hyperglycemia 05/08/2022   Uncontrolled type 2 diabetes mellitus with hyperglycemia (HCC) 05/07/2022   Glaucoma 05/07/2022   Neuropathic pain of both feet 11/05/2021   Decreased hearing of left ear 11/05/2021   Simple chronic bronchitis (HCC) 06/18/2021   Diet-controlled diabetes mellitus (HCC) 09/20/2020   History of prostate cancer 09/20/2020   Atherosclerosis of aorta (HCC) 09/20/2020   B12 deficiency 12/16/2016   Bladder outlet obstruction 04/25/2015   Erectile dysfunction due to arterial insufficiency 04/25/2015   Osteoporosis 04/25/2015   Prostate cancer (HCC) 05/30/2011   Dyslipidemia 05/30/2011   Hypogonadism male 05/30/2011   Malignant neoplasm of prostate (HCC) 05/30/2011   Past Medical History:  Diagnosis Date   Adenomatous colon polyp 03/2001   Allergy    SEASONAL   Cancer (HCC)    Cataract    Dizziness    GERD (gastroesophageal reflux disease)    Hearing loss    Hyperlipidemia    Low testosterone     Family History  Problem Relation Age of Onset   Cancer Father 63       pancreatic   Other Mother        age 29   Prostate cancer Brother    Colon cancer Neg Hx    Esophageal cancer Neg Hx    Liver cancer Neg Hx    Pancreatic cancer Neg Hx    Rectal cancer Neg Hx    Stomach cancer Neg Hx    Past Surgical History:  Procedure Laterality Date   APPENDECTOMY     COLONOSCOPY     EYE SURGERY     POLYPECTOMY      PROSTATE SURGERY     SPINE SURGERY     Social History   Occupational History   Occupation: RETIRED  Tobacco Use   Smoking status: Never    Passive exposure: Never   Smokeless tobacco: Never  Vaping Use   Vaping status: Never Used  Substance and Sexual Activity   Alcohol use: No    Alcohol/week: 0.0 standard drinks of alcohol   Drug use: No   Sexual activity: Yes    Zinia Innocent Afton Alderton, M.D. [facility], Hand Surgery

## 2023-10-15 DIAGNOSIS — N3946 Mixed incontinence: Secondary | ICD-10-CM | POA: Diagnosis not present

## 2023-10-15 DIAGNOSIS — E291 Testicular hypofunction: Secondary | ICD-10-CM | POA: Diagnosis not present

## 2023-10-15 DIAGNOSIS — C61 Malignant neoplasm of prostate: Secondary | ICD-10-CM | POA: Diagnosis not present

## 2023-10-19 ENCOUNTER — Other Ambulatory Visit: Payer: Self-pay | Admitting: Emergency Medicine

## 2023-10-19 DIAGNOSIS — K219 Gastro-esophageal reflux disease without esophagitis: Secondary | ICD-10-CM

## 2023-10-20 ENCOUNTER — Ambulatory Visit: Admitting: Family

## 2023-10-23 ENCOUNTER — Other Ambulatory Visit (HOSPITAL_COMMUNITY): Payer: Self-pay

## 2023-11-06 DIAGNOSIS — H401122 Primary open-angle glaucoma, left eye, moderate stage: Secondary | ICD-10-CM | POA: Diagnosis not present

## 2023-11-06 DIAGNOSIS — H04123 Dry eye syndrome of bilateral lacrimal glands: Secondary | ICD-10-CM | POA: Diagnosis not present

## 2023-11-06 DIAGNOSIS — H401111 Primary open-angle glaucoma, right eye, mild stage: Secondary | ICD-10-CM | POA: Diagnosis not present

## 2023-11-17 ENCOUNTER — Ambulatory Visit: Admitting: Orthopedic Surgery

## 2023-12-02 DIAGNOSIS — Z23 Encounter for immunization: Secondary | ICD-10-CM | POA: Diagnosis not present

## 2023-12-10 ENCOUNTER — Telehealth: Payer: Self-pay

## 2023-12-10 NOTE — Telephone Encounter (Signed)
 Copied from CRM #[id number]. Topic: Clinical - Medication Question >> Dec 10, 2023  2:10 PM Burnard DEL wrote: Reason for CRM: Patient would like to have prescription for covid vaccine sent to   Rhegan Trunnell [id number] [place] [place], [place] - [address] AT [facility]  Phone: [phone] Fax: [phone]

## 2023-12-11 ENCOUNTER — Other Ambulatory Visit: Payer: Self-pay | Admitting: Radiology

## 2023-12-11 MED ORDER — COVID-19 MRNA VAC-TRIS(PFIZER) 30 MCG/0.3ML IM SUSY: 0.3000 mL | PREFILLED_SYRINGE | Freq: Once | INTRAMUSCULAR | 0 refills | Status: AC

## 2023-12-11 NOTE — Telephone Encounter (Signed)
 Rx sent. Patient informed.

## 2023-12-17 DIAGNOSIS — Z23 Encounter for immunization: Secondary | ICD-10-CM | POA: Diagnosis not present

## 2023-12-25 DIAGNOSIS — Z23 Encounter for immunization: Secondary | ICD-10-CM | POA: Diagnosis not present

## 2024-01-09 ENCOUNTER — Other Ambulatory Visit: Payer: Self-pay | Admitting: Emergency Medicine

## 2024-01-09 DIAGNOSIS — M62838 Other muscle spasm: Secondary | ICD-10-CM

## 2024-02-01 ENCOUNTER — Encounter: Payer: Self-pay | Admitting: Radiology

## 2024-02-05 DIAGNOSIS — E1165 Type 2 diabetes mellitus with hyperglycemia: Secondary | ICD-10-CM | POA: Diagnosis not present

## 2024-02-05 DIAGNOSIS — E559 Vitamin D deficiency, unspecified: Secondary | ICD-10-CM | POA: Diagnosis not present

## 2024-02-05 DIAGNOSIS — E785 Hyperlipidemia, unspecified: Secondary | ICD-10-CM | POA: Diagnosis not present

## 2024-02-12 DIAGNOSIS — Z794 Long term (current) use of insulin: Secondary | ICD-10-CM | POA: Diagnosis not present

## 2024-02-12 DIAGNOSIS — G629 Polyneuropathy, unspecified: Secondary | ICD-10-CM | POA: Diagnosis not present

## 2024-02-12 DIAGNOSIS — E785 Hyperlipidemia, unspecified: Secondary | ICD-10-CM | POA: Diagnosis not present

## 2024-02-12 DIAGNOSIS — C61 Malignant neoplasm of prostate: Secondary | ICD-10-CM | POA: Diagnosis not present

## 2024-02-12 DIAGNOSIS — Z8739 Personal history of other diseases of the musculoskeletal system and connective tissue: Secondary | ICD-10-CM | POA: Diagnosis not present

## 2024-02-12 DIAGNOSIS — E1165 Type 2 diabetes mellitus with hyperglycemia: Secondary | ICD-10-CM | POA: Diagnosis not present

## 2024-02-12 DIAGNOSIS — E559 Vitamin D deficiency, unspecified: Secondary | ICD-10-CM | POA: Diagnosis not present

## 2024-02-15 ENCOUNTER — Other Ambulatory Visit: Payer: Self-pay | Admitting: Emergency Medicine

## 2024-02-15 DIAGNOSIS — E785 Hyperlipidemia, unspecified: Secondary | ICD-10-CM

## 2024-02-15 DIAGNOSIS — E1165 Type 2 diabetes mellitus with hyperglycemia: Secondary | ICD-10-CM

## 2024-02-17 ENCOUNTER — Ambulatory Visit (INDEPENDENT_AMBULATORY_CARE_PROVIDER_SITE_OTHER): Admitting: Emergency Medicine

## 2024-02-17 ENCOUNTER — Encounter: Payer: Self-pay | Admitting: Emergency Medicine

## 2024-02-17 VITALS — BP 180/80 | HR 83 | Temp 97.5°F | Ht 71.0 in | Wt 185.0 lb

## 2024-02-17 DIAGNOSIS — M62838 Other muscle spasm: Secondary | ICD-10-CM

## 2024-02-17 DIAGNOSIS — M5412 Radiculopathy, cervical region: Secondary | ICD-10-CM | POA: Diagnosis not present

## 2024-02-17 MED ORDER — MELOXICAM 15 MG PO TABS: 15.0000 mg | ORAL_TABLET | Freq: Every day | ORAL | 0 refills | Status: AC

## 2024-02-17 MED ORDER — CYCLOBENZAPRINE HCL 10 MG PO TABS: 10.0000 mg | ORAL_TABLET | Freq: Every day | ORAL | 0 refills | Status: AC

## 2024-02-17 NOTE — Patient Instructions (Signed)
 Cervical Radiculopathy  Cervical radiculopathy means that a nerve in the neck (a cervical nerve) is pinched or bruised. This can happen because of an injury to the cervical spine (vertebrae) in the neck, or as a normal part of getting older. This condition can cause pain or loss of feeling (numbness) that runs from your neck all the way down to your arm and fingers. Often, this condition gets better with rest. Treatment may be needed if the condition does not get better. What are the causes? A neck injury. A bulging disk in your spine. Sudden muscle tightening (muscle spasms). Tight muscles in your neck due to overuse. Arthritis. Breakdown in the bones and joints of the spine (spondylosis) due to getting older. Bone spurs that form near the nerves in the neck. What are the signs or symptoms? Pain. The pain may: Run from the neck to the arm and hand. Be very bad or irritating. Get worse when you move your neck. Loss of feeling or tingling in your arm or hand. Weakness in your arm or hand, in very bad cases. How is this treated? In many cases, treatment is not needed for this condition. With rest, the condition often gets better over time. If treatment is needed, options may include: Wearing a soft neck collar (cervical collar) for short periods of time. Doing exercises (physical therapy) to strengthen your neck muscles. Taking medicines. Having shots (injections) in your spine, in very bad cases. Having surgery. This may be needed if other treatments do not help. The type of surgery that is used will depend on the cause of your condition. Follow these instructions at home: If you have a soft neck collar: Wear it as told by your doctor. Take it off only as told by your doctor. Ask your doctor if you can take the collar off for cleaning and bathing. If you are allowed to take the collar off for cleaning or bathing: Follow instructions from your doctor about how to take off the collar  safely. Clean the collar by wiping it with mild soap and water and drying it completely. Take out any removable pads in the collar every 1-2 days. Wash them by hand with soap and water. Let them air-dry completely before you put them back in the collar. Check your skin under the collar for redness or sores. If you see any, tell your doctor. Managing pain     Take over-the-counter and prescription medicines only as told by your doctor. If told, put ice on the painful area. To do this: If you have a soft neck collar, take if off as told by your doctor. Put ice in a plastic bag. Place a towel between your skin and the bag. Leave the ice on for 20 minutes, 2-3 times a day. Take off the ice if your skin turns bright red. This is very important. If you cannot feel pain, heat, or cold, you have a greater risk of damage to the area. If using ice does not help, you can try using heat. Use the heat source that your doctor recommends, such as a moist heat pack or a heating pad. Place a towel between your skin and the heat source. Leave the heat on for 20-30 minutes. Take off the heat if your skin turns bright red. This is very important. If you cannot feel pain, heat, or cold, you have a greater risk of getting burned. You may try a gentle neck and shoulder rub (massage). Activity Rest as needed. Return  to your normal activities when your doctor says that it is safe. Do exercises as told by your doctor or physical therapist. You may have to avoid lifting. Ask your doctor how much you can safely lift. General instructions Use a flat pillow when you sleep. Do not drive while wearing a soft neck collar. If you do not have a soft neck collar, ask your doctor if it is safe to drive while your neck heals. Ask your doctor if you should avoid driving or using machines while you are taking your medicine. Do not smoke or use any products that contain nicotine or tobacco. If you need help quitting, ask your  doctor. Keep all follow-up visits. Contact a doctor if: Your condition does not get better with treatment. Get help right away if: Your pain gets worse and medicine does not help. You lose feeling or feel weak in your hand, arm, face, or leg. You have a high fever. Your neck is stiff. You cannot control when you poop or pee (have incontinence). You have trouble with walking, balance, or talking. Summary Cervical radiculopathy means that a nerve in the neck is pinched or bruised. A nerve can get pinched from a bulging disk, arthritis, an injury to the neck, or other causes. Symptoms include pain, tingling, or loss of feeling that goes from the neck to the arm or hand. Weakness in your arm or hand can happen in very bad cases. Treatment may include resting, wearing a soft neck collar, and doing exercises. You might need to take medicines for pain. In very bad cases, shots or surgery may be needed. This information is not intended to replace advice given to you by your health care provider. Make sure you discuss any questions you have with your health care provider. Document Revised: 09/20/2020 Document Reviewed: 09/20/2020 Elsevier Patient Education  2024 [employer].

## 2024-02-17 NOTE — Assessment & Plan Note (Signed)
 Active and affecting quality of life Doing physical therapy exercises at home Recommend Flexeril  10 mg at bedtime Meloxicam  50 mg daily for 10 days May need referral to spinal surgeon

## 2024-02-17 NOTE — Assessment & Plan Note (Signed)
 Active, present on physical examination, and affecting quality of life Recommend Flexeril 10 mg at bedtime Recommend heat pad frequently during the day May take Tylenol as needed for pain Advised to contact the office if no better or worse during the next several days

## 2024-02-17 NOTE — Progress Notes (Signed)
 Douglas [name] 88 y.o.   Chief Complaint  Patient presents with   Pain    In the neck and hands  and shoulder pain on the left side this started a month ago    HISTORY OF PRESENT ILLNESS: This is a 88 y.o. male complaining of pain to left side of his neck with radiation down to left shoulder left arm and left hand for the past several months No other associated symptoms.  Denies any direct injuries. No other complaints or medical concerns today.  HPI   Prior to Admission medications   Medication Sig Start Date End Date Taking? Authorizing Provider  cyclobenzaprine  (FLEXERIL ) 10 MG tablet Take 1 tablet (10 mg total) by mouth at bedtime. 02/17/24  Yes Brentney Goldbach, Emil Schanz, MD  meloxicam  (MOBIC ) 15 MG tablet Take 1 tablet (15 mg total) by mouth daily for 10 days. 02/17/24 02/27/24 Yes Savvas Roper, Emil Schanz, MD  acetaminophen  (TYLENOL ) 500 MG tablet Take 500-1,000 mg by mouth every 6 (six) hours as needed for mild pain or headache.    [provider]  albuterol  (VENTOLIN  HFA) 108 (90 Base) MCG/ACT inhaler TAKE 2 PUFFS BY MOUTH EVERY 6 HOURS AS NEEDED FOR WHEEZE OR SHORTNESS OF BREATH Strength: 108 (90 Base) MCG/ACT 09/04/23   Purcell Emil Schanz, MD  blood glucose meter kit and supplies 1 each by Other route 3 (three) times daily. 05/09/22   Jillian Buttery, MD  brimonidine  (ALPHAGAN ) 0.2 % ophthalmic solution Place 1 drop into the left eye daily.    [provider]  cetirizine  (ZYRTEC ) 10 MG tablet Take 1 tablet (10 mg total) by mouth daily. 01/27/23   Duff Pozzi Jose, MD  dorzolamide -timolol  (COSOPT ) 2-0.5 % ophthalmic solution Place 1 drop into both eyes 2 (two) times daily.    [provider]  Glucagon  (GVOKE HYPOPEN  2-PACK) 0.5 MG/0.1ML SOAJ Inject 0.5 mg into the skin as needed. 05/12/22   Zackarie Chason Jose, MD  Insulin  Lispro Prot & Lispro (HUMALOG  75/25 MIX) (75-25) 100 UNIT/ML Kwikpen INJECT 15 UNITS INTO THE SKIN 2 (TWO) TIMES DAILY. 05/07/23    Purcell Emil Schanz, MD  Insulin  Pen Needle (B-D UF III MINI PEN NEEDLES) 31G X 5 MM MISC USE AS DIRECTED TWICE A DAY 06/09/23   Purcell Emil Schanz, MD  JARDIANCE  10 MG TABS tablet TAKE 1 TABLET BY MOUTH EVERY DAY BEFORE BREAKFAST 02/15/24   Noelle Sease, Emil Schanz, MD  Lancets [facility] ULTRASOFT) lancets Use as instructed to check his blood sugars 3 times a day 05/12/22   Purcell Emil Schanz, MD  latanoprost  (XALATAN ) 0.005 % ophthalmic solution Place 1 drop into both eyes at bedtime. 07/14/19   [provider]  Multiple Vitamin (MULTIVITAMIN) tablet Take 1 tablet by mouth at bedtime.    [provider]  [facility] VERIO test strip USE AS INSTRUCTED TO CHECK HIS BLOOD SUGARS 3 TIMES A DAY 07/20/23   Purcell Emil Schanz, MD  pantoprazole  (PROTONIX ) 40 MG tablet TAKE 1 TABLET BY MOUTH EVERY DAY 10/19/23   Purcell Emil Schanz, MD  RESTASIS  0.05 % ophthalmic emulsion Place 1 drop into both eyes 2 (two) times daily. 07/13/17   [provider]  RHOPRESSA  0.02 % SOLN Place 1 drop into both eyes at bedtime.    [provider]  simvastatin  (ZOCOR ) 20 MG tablet TAKE 1 TABLET DAILY AT 6PM. 02/15/24   Tarrence Enck Jose, MD  tamsulosin (FLOMAX) 0.4 MG CAPS capsule Take 0.4 mg by mouth daily.    [provider]  Vibegron (GEMTESA) 75 MG TABS Take 75 tablets by mouth daily.    [provider]    No Known Allergies  Patient Active Problem List   Diagnosis Date Noted   Cervical radiculopathy 02/17/2024   Trigger middle finger of right hand 07/28/2023   Neck muscle spasm 03/09/2023   Chronic cough 02/19/2023   Dyslipidemia associated with type 2 diabetes mellitus (HCC) 05/12/2022   Hyperglycemia 05/08/2022   Uncontrolled type 2 diabetes mellitus with hyperglycemia (HCC) 05/07/2022   Glaucoma 05/07/2022   Neuropathic pain of both feet 11/05/2021   Decreased hearing of left ear 11/05/2021   Simple chronic bronchitis (HCC) 06/18/2021    Diet-controlled diabetes mellitus (HCC) 09/20/2020   History of prostate cancer 09/20/2020   Atherosclerosis of aorta 09/20/2020   B12 deficiency 12/16/2016   Bladder outlet obstruction 04/25/2015   Erectile dysfunction due to arterial insufficiency 04/25/2015   Osteoporosis 04/25/2015   Prostate cancer (HCC) 05/30/2011   Dyslipidemia 05/30/2011   Hypogonadism male 05/30/2011   Malignant neoplasm of prostate (HCC) 05/30/2011    Past Medical History:  Diagnosis Date   Adenomatous colon polyp 03/2001   Allergy    SEASONAL   Cancer (HCC)    Cataract    Dizziness    GERD (gastroesophageal reflux disease)    Hearing loss    Hyperlipidemia    Low testosterone      Past Surgical History:  Procedure Laterality Date   APPENDECTOMY     COLONOSCOPY     EYE SURGERY     POLYPECTOMY     PROSTATE SURGERY     SPINE SURGERY      Social History   Socioeconomic History   Marital status: Married    Spouse name: Not on file   Number of children: 3   Years of education: 2 years [employer]   Highest education level: Not on file  Occupational History   Occupation: RETIRED  Tobacco Use   Smoking status: Never    Passive exposure: Never   Smokeless tobacco: Never  Vaping Use   Vaping status: Never Used  Substance and Sexual Activity   Alcohol use: No    Alcohol/week: 0.0 standard drinks of alcohol   Drug use: No   Sexual activity: Yes  Other Topics Concern   Not on file  Social History Narrative   Married. Education: [employer]. Exercise: Yes.   Right-handed.   Drinks coffee daily.   Lives at home with his wife.   Social Drivers of Corporate Investment Banker Strain: Low Risk  (07/20/2023)   Overall Financial Resource Strain (CARDIA)    Difficulty of Paying Living Expenses: Not hard at all  Food Insecurity: No Food Insecurity (07/20/2023)   Hunger Vital Sign    Worried About Running Out of Food in the Last Year: Never true    Ran Out of Food in the Last Year: Never true   Transportation Needs: No Transportation Needs (07/20/2023)   PRAPARE - Administrator, Civil Service (Medical): No    Lack of Transportation (Non-Medical): No  Physical Activity: Sufficiently Active (07/20/2023)   Exercise Vital Sign    Days of Exercise per Week: 4 days    Minutes of Exercise per Session: 50 min  Stress: No Stress Concern Present (07/20/2023)   [employer] of Occupational Health - Occupational Stress Questionnaire    Feeling of Stress : Not at all  Social Connections: Moderately Isolated (07/20/2023)   Social Connection and Isolation Panel  Frequency of Communication with Friends and Family: More than three times a week    Frequency of Social Gatherings with Friends and Family: Once a week    Attends Religious Services: Never    Database Administrator or Organizations: No    Attends Banker Meetings: Never    Marital Status: Married  Catering Manager Violence: Not At Risk (07/20/2023)   Humiliation, Afraid, Rape, and Kick questionnaire    Fear of Current or Ex-Partner: No    Emotionally Abused: No    Physically Abused: No    Sexually Abused: No    Family History  Problem Relation Age of Onset   Cancer Father 54       pancreatic   Other Mother        age 29   Prostate cancer Brother    Colon cancer Neg Hx    Esophageal cancer Neg Hx    Liver cancer Neg Hx    Pancreatic cancer Neg Hx    Rectal cancer Neg Hx    Stomach cancer Neg Hx      Review of Systems  Constitutional: Negative.  Negative for chills and fever.  [name]: Negative.  Negative for congestion and sore throat.   Respiratory: Negative.  Negative for cough and shortness of breath.   Cardiovascular: Negative.  Negative for chest pain and palpitations.  Gastrointestinal:  Negative for abdominal pain, diarrhea, nausea and vomiting.  Genitourinary: Negative.  Negative for dysuria and hematuria.  Skin: Negative.  Negative for rash.  Neurological:  Positive for sensory  change. Negative for dizziness and headaches.    Vitals:   02/17/24 1033  BP: (!) 180/80  Pulse: 83  Temp: (!) 97.5 F (36.4 C)  SpO2: 98%    Physical Exam Vitals reviewed.  Constitutional:      Appearance: Normal appearance.  [name]:     Head: Normocephalic.  Eyes:     Extraocular Movements: Extraocular movements intact.  Cardiovascular:     Rate and Rhythm: Normal rate and regular rhythm.     Pulses: Normal pulses.     Heart sounds: Normal heart sounds.  Pulmonary:     Effort: Pulmonary effort is normal.     Breath sounds: Normal breath sounds.  Musculoskeletal:     Cervical back: No tenderness. Pain with movement and muscular tenderness present. No spinous process tenderness. Decreased range of motion.     Comments: Spasm and tenderness left side of neck and trapezius  Lymphadenopathy:     Cervical: No cervical adenopathy.  Skin:    General: Skin is warm and dry.     Capillary Refill: Capillary refill takes less than 2 seconds.  Neurological:     General: No focal deficit present.     Mental Status: He is alert and oriented to person, place, and time.  Psychiatric:        Mood and Affect: Mood normal.        Behavior: Behavior normal.      ASSESSMENT & PLAN: Problem List Items Addressed This Visit       Nervous and Auditory   Cervical radiculopathy - Primary   Active and affecting quality of life Doing physical therapy exercises at home Recommend Flexeril  10 mg at bedtime Meloxicam  50 mg daily for 10 days May need referral to spinal surgeon      Relevant Medications   meloxicam  (MOBIC ) 15 MG tablet   cyclobenzaprine  (FLEXERIL ) 10 MG tablet     Musculoskeletal and Integument  Neck muscle spasm   Active, present on physical examination, and affecting quality of life Recommend Flexeril  10 mg at bedtime Recommend heat pad frequently during the day May take Tylenol  as needed for pain Advised to contact the office if no better or worse during the next  several days      Patient Instructions  Cervical Radiculopathy  Cervical radiculopathy means that a nerve in the neck (a cervical nerve) is pinched or bruised. This can happen because of an injury to the cervical spine (vertebrae) in the neck, or as a normal part of getting older. This condition can cause pain or loss of feeling (numbness) that runs from your neck all the way down to your arm and fingers. Often, this condition gets better with rest. Treatment may be needed if the condition does not get better. What are the causes? A neck injury. A bulging disk in your spine. Sudden muscle tightening (muscle spasms). Tight muscles in your neck due to overuse. Arthritis. Breakdown in the bones and joints of the spine (spondylosis) due to getting older. Bone spurs that form near the nerves in the neck. What are the signs or symptoms? Pain. The pain may: Run from the neck to the arm and hand. Be very bad or irritating. Get worse when you move your neck. Loss of feeling or tingling in your arm or hand. Weakness in your arm or hand, in very bad cases. How is this treated? In many cases, treatment is not needed for this condition. With rest, the condition often gets better over time. If treatment is needed, options may include: Wearing a soft neck collar (cervical collar) for short periods of time. Doing exercises (physical therapy) to strengthen your neck muscles. Taking medicines. Having shots (injections) in your spine, in very bad cases. Having surgery. This may be needed if other treatments do not help. The type of surgery that is used will depend on the cause of your condition. Follow these instructions at home: If you have a soft neck collar: Wear it as told by your doctor. Take it off only as told by your doctor. Ask your doctor if you can take the collar off for cleaning and bathing. If you are allowed to take the collar off for cleaning or bathing: Follow instructions from your  doctor about how to take off the collar safely. Clean the collar by wiping it with mild soap and water and drying it completely. Take out any removable pads in the collar every 1-2 days. Wash them by hand with soap and water. Let them air-dry completely before you put them back in the collar. Check your skin under the collar for redness or sores. If you see any, tell your doctor. Managing pain     Take over-the-counter and prescription medicines only as told by your doctor. If told, put ice on the painful area. To do this: If you have a soft neck collar, take if off as told by your doctor. Put ice in a plastic bag. Place a towel between your skin and the bag. Leave the ice on for 20 minutes, 2-3 times a day. Take off the ice if your skin turns bright red. This is very important. If you cannot feel pain, heat, or cold, you have a greater risk of damage to the area. If using ice does not help, you can try using heat. Use the heat source that your doctor recommends, such as a moist heat pack or a heating pad. Place a towel  between your skin and the heat source. Leave the heat on for 20-30 minutes. Take off the heat if your skin turns bright red. This is very important. If you cannot feel pain, heat, or cold, you have a greater risk of getting burned. You may try a gentle neck and shoulder rub (massage). Activity Rest as needed. Return to your normal activities when your doctor says that it is safe. Do exercises as told by your doctor or physical therapist. You may have to avoid lifting. Ask your doctor how much you can safely lift. General instructions Use a flat pillow when you sleep. Do not drive while wearing a soft neck collar. If you do not have a soft neck collar, ask your doctor if it is safe to drive while your neck heals. Ask your doctor if you should avoid driving or using machines while you are taking your medicine. Do not smoke or use any products that contain nicotine or  tobacco. If you need help quitting, ask your doctor. Keep all follow-up visits. Contact a doctor if: Your condition does not get better with treatment. Get help right away if: Your pain gets worse and medicine does not help. You lose feeling or feel weak in your hand, arm, face, or leg. You have a high fever. Your neck is stiff. You cannot control when you poop or pee (have incontinence). You have trouble with walking, balance, or talking. Summary Cervical radiculopathy means that a nerve in the neck is pinched or bruised. A nerve can get pinched from a bulging disk, arthritis, an injury to the neck, or other causes. Symptoms include pain, tingling, or loss of feeling that goes from the neck to the arm or hand. Weakness in your arm or hand can happen in very bad cases. Treatment may include resting, wearing a soft neck collar, and doing exercises. You might need to take medicines for pain. In very bad cases, shots or surgery may be needed. This information is not intended to replace advice given to you by your health care provider. Make sure you discuss any questions you have with your health care provider. Document Revised: 09/20/2020 Document Reviewed: 09/20/2020 Elsevier Patient Education  2024 [employer]     Emil Schaumann, MD  [facility]

## 2024-02-29 DIAGNOSIS — H401111 Primary open-angle glaucoma, right eye, mild stage: Secondary | ICD-10-CM | POA: Diagnosis not present

## 2024-02-29 DIAGNOSIS — H401122 Primary open-angle glaucoma, left eye, moderate stage: Secondary | ICD-10-CM | POA: Diagnosis not present

## 2024-04-03 ENCOUNTER — Other Ambulatory Visit: Payer: Self-pay | Admitting: Emergency Medicine

## 2024-04-03 DIAGNOSIS — K219 Gastro-esophageal reflux disease without esophagitis: Secondary | ICD-10-CM

## 2024-04-06 ENCOUNTER — Other Ambulatory Visit: Payer: Self-pay | Admitting: Emergency Medicine

## 2024-04-06 ENCOUNTER — Encounter: Payer: Self-pay | Admitting: Emergency Medicine

## 2024-04-06 ENCOUNTER — Ambulatory Visit: Payer: Self-pay | Admitting: Emergency Medicine

## 2024-04-06 ENCOUNTER — Ambulatory Visit: Admitting: Emergency Medicine

## 2024-04-06 VITALS — BP 130/64 | HR 82 | Temp 98.0°F | Ht 71.0 in | Wt 187.0 lb

## 2024-04-06 DIAGNOSIS — Z7984 Long term (current) use of oral hypoglycemic drugs: Secondary | ICD-10-CM | POA: Diagnosis not present

## 2024-04-06 DIAGNOSIS — R0981 Nasal congestion: Secondary | ICD-10-CM

## 2024-04-06 DIAGNOSIS — E1169 Type 2 diabetes mellitus with other specified complication: Secondary | ICD-10-CM

## 2024-04-06 DIAGNOSIS — I700 Atherosclerosis of aorta: Secondary | ICD-10-CM

## 2024-04-06 DIAGNOSIS — R202 Paresthesia of skin: Secondary | ICD-10-CM | POA: Diagnosis not present

## 2024-04-06 DIAGNOSIS — E785 Hyperlipidemia, unspecified: Secondary | ICD-10-CM | POA: Diagnosis not present

## 2024-04-06 DIAGNOSIS — R0982 Postnasal drip: Secondary | ICD-10-CM

## 2024-04-06 DIAGNOSIS — J410 Simple chronic bronchitis: Secondary | ICD-10-CM

## 2024-04-06 LAB — COMPREHENSIVE METABOLIC PANEL WITH GFR
ALT: 18 U/L (ref 3–53)
AST: 16 U/L (ref 5–37)
Albumin: 4.2 g/dL (ref 3.5–5.2)
Alkaline Phosphatase: 62 U/L (ref 39–117)
BUN: 16 mg/dL (ref 6–23)
CO2: 29 meq/L (ref 19–32)
Calcium: 9.8 mg/dL (ref 8.4–10.5)
Chloride: 104 meq/L (ref 96–112)
Creatinine, Ser: 0.97 mg/dL (ref 0.40–1.50)
GFR: 70.51 mL/min
Glucose, Bld: 108 mg/dL — ABNORMAL HIGH (ref 70–99)
Potassium: 4 meq/L (ref 3.5–5.1)
Sodium: 138 meq/L (ref 135–145)
Total Bilirubin: 0.5 mg/dL (ref 0.2–1.2)
Total Protein: 7.7 g/dL (ref 6.0–8.3)

## 2024-04-06 LAB — CBC WITH DIFFERENTIAL/PLATELET
Basophils Absolute: 0 K/uL (ref 0.0–0.1)
Basophils Relative: 0.4 % (ref 0.0–3.0)
Eosinophils Absolute: 0.3 K/uL (ref 0.0–0.7)
Eosinophils Relative: 5.9 % — ABNORMAL HIGH (ref 0.0–5.0)
HCT: 38 % — ABNORMAL LOW (ref 39.0–52.0)
Hemoglobin: 12.5 g/dL — ABNORMAL LOW (ref 13.0–17.0)
Lymphocytes Relative: 54.4 % — ABNORMAL HIGH (ref 12.0–46.0)
Lymphs Abs: 2.6 K/uL (ref 0.7–4.0)
MCHC: 32.9 g/dL (ref 30.0–36.0)
MCV: 97.2 fl (ref 78.0–100.0)
Monocytes Absolute: 0.5 K/uL (ref 0.1–1.0)
Monocytes Relative: 10 % (ref 3.0–12.0)
Neutro Abs: 1.4 K/uL (ref 1.4–7.7)
Neutrophils Relative %: 29.3 % — ABNORMAL LOW (ref 43.0–77.0)
Platelets: 136 K/uL — ABNORMAL LOW (ref 150.0–400.0)
RBC: 3.91 Mil/uL — ABNORMAL LOW (ref 4.22–5.81)
RDW: 13.9 % (ref 11.5–15.5)
WBC: 4.9 K/uL (ref 4.0–10.5)

## 2024-04-06 LAB — LIPID PANEL
Cholesterol: 123 mg/dL (ref 28–200)
HDL: 46.1 mg/dL
LDL Cholesterol: 54 mg/dL (ref 10–99)
NonHDL: 76.97
Total CHOL/HDL Ratio: 3
Triglycerides: 115 mg/dL (ref 10.0–149.0)
VLDL: 23 mg/dL (ref 0.0–40.0)

## 2024-04-06 LAB — POCT GLYCOSYLATED HEMOGLOBIN (HGB A1C): HbA1c POC (<> result, manual entry): 6.1 %

## 2024-04-06 LAB — VITAMIN B12: Vitamin B-12: 361 pg/mL (ref 211–911)

## 2024-04-06 NOTE — Patient Instructions (Signed)
 Health Maintenance After Age 87 After age 27, you are at a higher risk for certain long-term diseases and infections as well as injuries from falls. Falls are a major cause of broken bones and head injuries in people who are older than age 73. Getting regular preventive care can help to keep you healthy and well. Preventive care includes getting regular testing and making lifestyle changes as recommended by your health care provider. Talk with your health care provider about: Which screenings and tests you should have. A screening is a test that checks for a disease when you have no symptoms. A diet and exercise plan that is right for you. What should I know about screenings and tests to prevent falls? Screening and testing are the best ways to find a health problem early. Early diagnosis and treatment give you the best chance of managing medical conditions that are common after age 90. Certain conditions and lifestyle choices may make you more likely to have a fall. Your health care provider may recommend: Regular vision checks. Poor vision and conditions such as cataracts can make you more likely to have a fall. If you wear glasses, make sure to get your prescription updated if your vision changes. Medicine review. Work with your health care provider to regularly review all of the medicines you are taking, including over-the-counter medicines. Ask your health care provider about any side effects that may make you more likely to have a fall. Tell your health care provider if any medicines that you take make you feel dizzy or sleepy. Strength and balance checks. Your health care provider may recommend certain tests to check your strength and balance while standing, walking, or changing positions. Foot health exam. Foot pain and numbness, as well as not wearing proper footwear, can make you more likely to have a fall. Screenings, including: Osteoporosis screening. Osteoporosis is a condition that causes  the bones to get weaker and break more easily. Blood pressure screening. Blood pressure changes and medicines to control blood pressure can make you feel dizzy. Depression screening. You may be more likely to have a fall if you have a fear of falling, feel depressed, or feel unable to do activities that you used to do. Alcohol  use screening. Using too much alcohol  can affect your balance and may make you more likely to have a fall. Follow these instructions at home: Lifestyle Do not drink alcohol  if: Your health care provider tells you not to drink. If you drink alcohol : Limit how much you have to: 0-1 drink a day for women. 0-2 drinks a day for men. Know how much alcohol  is in your drink. In the [place] one drink equals one 12 oz bottle of beer (355 mL), one 5 oz glass of wine (148 mL), or one 1 oz glass of hard liquor (44 mL). Do not use any products that contain nicotine or tobacco. These products include cigarettes, chewing tobacco, and vaping devices, such as e-cigarettes. If you need help quitting, ask your health care provider. Activity  Follow a regular exercise program to stay fit. This will help you maintain your balance. Ask your health care provider what types of exercise are appropriate for you. If you need a cane or walker, use it as recommended by your health care provider. Wear supportive shoes that have nonskid soles. Safety  Remove any tripping hazards, such as rugs, cords, and clutter. Install safety equipment such as grab bars in bathrooms and safety rails on stairs. Keep rooms and walkways  well-lit. General instructions Talk with your health care provider about your risks for falling. Tell your health care provider if: You fall. Be sure to tell your health care provider about all falls, even ones that seem minor. You feel dizzy, tiredness (fatigue), or off-balance. Take over-the-counter and prescription medicines only as told by your health care provider. These include  supplements. Eat a healthy diet and maintain a healthy weight. A healthy diet includes low-fat dairy products, low-fat (lean) meats, and fiber from whole grains, beans, and lots of fruits and vegetables. Stay current with your vaccines. Schedule regular health, dental, and eye exams. Summary Having a healthy lifestyle and getting preventive care can help to protect your health and wellness after age 15. Screening and testing are the best way to find a health problem early and help you avoid having a fall. Early diagnosis and treatment give you the best chance for managing medical conditions that are more common for people who are older than age 42. Falls are a major cause of broken bones and head injuries in people who are older than age 64. Take precautions to prevent a fall at home. Work with your health care provider to learn what changes you can make to improve your health and wellness and to prevent falls. This information is not intended to replace advice given to you by your health care provider. Make sure you discuss any questions you have with your health care provider. Document Revised: 08/06/2020 Document Reviewed: 08/06/2020 Elsevier Patient Education  2024 [employer].

## 2024-04-06 NOTE — Progress Notes (Signed)
 Douglas [name] 89 y.o.   Chief Complaint  Patient presents with   Follow-up    Pt states that his thumb states numb a lot     HISTORY OF PRESENT ILLNESS: This is a 89 y.o. male here for 64-month follow-up of chronic medical conditions Complaining of occasional left thumb numbness that comes and goes No other complaints or medical concerns today.   HPI   Prior to Admission medications  Medication Sig Start Date End Date Taking? Authorizing Provider  acetaminophen  (TYLENOL ) 500 MG tablet Take 500-1,000 mg by mouth every 6 (six) hours as needed for mild pain or headache.   Yes [provider]  albuterol  (VENTOLIN  HFA) 108 (90 Base) MCG/ACT inhaler TAKE 2 PUFFS BY MOUTH EVERY 6 HOURS AS NEEDED FOR WHEEZE OR SHORTNESS OF BREATH Strength: 108 (90 Base) MCG/ACT 09/04/23  Yes Woodson Macha, Emil Schanz, MD  blood glucose meter kit and supplies 1 each by Other route 3 (three) times daily. 05/09/22  Yes Jillian Buttery, MD  brimonidine  (ALPHAGAN ) 0.2 % ophthalmic solution Place 1 drop into the left eye daily.   Yes [provider]  cetirizine  (ZYRTEC ) 10 MG tablet TAKE 1 TABLET BY MOUTH EVERY DAY 04/06/24  Yes Makhia Vosler, Emil Schanz, MD  cyclobenzaprine  (FLEXERIL ) 10 MG tablet Take 1 tablet (10 mg total) by mouth at bedtime. 02/17/24  Yes Agam Davenport, Emil Schanz, MD  dorzolamide -timolol  (COSOPT ) 2-0.5 % ophthalmic solution Place 1 drop into both eyes 2 (two) times daily.   Yes [provider]  Glucagon  (GVOKE HYPOPEN  2-PACK) 0.5 MG/0.1ML SOAJ Inject 0.5 mg into the skin as needed. 05/12/22  Yes Daymion Nazaire, Emil Schanz, MD  Insulin  Lispro Prot & Lispro (HUMALOG  75/25 MIX) (75-25) 100 UNIT/ML Kwikpen INJECT 15 UNITS INTO THE SKIN 2 (TWO) TIMES DAILY. 05/07/23  Yes Kaspar Albornoz, Emil Schanz, MD  Insulin  Pen Needle (B-D UF III MINI PEN NEEDLES) 31G X 5 MM MISC USE AS DIRECTED TWICE A DAY 06/09/23  Yes Shiasia Porro, Emil Schanz, MD  JARDIANCE  10 MG TABS tablet TAKE 1 TABLET BY MOUTH EVERY DAY BEFORE  BREAKFAST 02/15/24  Yes Charis Juliana, Emil Schanz, MD  Lancets [facility] ULTRASOFT) lancets Use as instructed to check his blood sugars 3 times a day 05/12/22  Yes Donja Tipping, Emil Schanz, MD  latanoprost  (XALATAN ) 0.005 % ophthalmic solution Place 1 drop into both eyes at bedtime. 07/14/19  Yes [provider]  Multiple Vitamin (MULTIVITAMIN) tablet Take 1 tablet by mouth at bedtime.   Yes [provider]  ONETOUCH VERIO test strip USE AS INSTRUCTED TO CHECK HIS BLOOD SUGARS 3 TIMES A DAY 07/20/23  Yes Emy Angevine, Emil Schanz, MD  pantoprazole  (PROTONIX ) 40 MG tablet TAKE 1 TABLET BY MOUTH EVERY DAY 04/03/24  Yes Adrijana Haros, Emil Schanz, MD  RESTASIS  0.05 % ophthalmic emulsion Place 1 drop into both eyes 2 (two) times daily. 07/13/17  Yes [provider]  RHOPRESSA  0.02 % SOLN Place 1 drop into both eyes at bedtime.   Yes [provider]  simvastatin  (ZOCOR ) 20 MG tablet TAKE 1 TABLET DAILY AT 6PM. 02/15/24  Yes Chet Greenley, Emil Schanz, MD  tamsulosin (FLOMAX) 0.4 MG CAPS capsule Take 0.4 mg by mouth daily.   Yes [provider]  Vibegron (GEMTESA) 75 MG TABS Take 75 tablets by mouth daily.   Yes [provider]    Allergies[1]  Patient Active Problem List   Diagnosis Date Noted   Cervical radiculopathy 02/17/2024   Trigger middle finger of right hand 07/28/2023   Neck muscle  spasm 03/09/2023   Chronic cough 02/19/2023   Dyslipidemia associated with type 2 diabetes mellitus (HCC) 05/12/2022   Hyperglycemia 05/08/2022   Uncontrolled type 2 diabetes mellitus with hyperglycemia (HCC) 05/07/2022   Glaucoma 05/07/2022   Neuropathic pain of both feet 11/05/2021   Decreased hearing of left ear 11/05/2021   Simple chronic bronchitis (HCC) 06/18/2021   Diet-controlled diabetes mellitus (HCC) 09/20/2020   History of prostate cancer 09/20/2020   Atherosclerosis of aorta 09/20/2020   B12 deficiency 12/16/2016   Bladder outlet obstruction 04/25/2015   Erectile  dysfunction due to arterial insufficiency 04/25/2015   Osteoporosis 04/25/2015   Prostate cancer (HCC) 05/30/2011   Dyslipidemia 05/30/2011   Hypogonadism male 05/30/2011   Malignant neoplasm of prostate (HCC) 05/30/2011    Past Medical History:  Diagnosis Date   Adenomatous colon polyp 03/2001   Allergy    SEASONAL   Cancer (HCC)    Cataract    Dizziness    GERD (gastroesophageal reflux disease)    Hearing loss    Hyperlipidemia    Low testosterone      Past Surgical History:  Procedure Laterality Date   APPENDECTOMY     COLONOSCOPY     EYE SURGERY     POLYPECTOMY     PROSTATE SURGERY     SPINE SURGERY      Social History   Socioeconomic History   Marital status: Married    Spouse name: Not on file   Number of children: 3   Years of education: 2 years [employer]   Highest education level: Not on file  Occupational History   Occupation: RETIRED  Tobacco Use   Smoking status: Never    Passive exposure: Never   Smokeless tobacco: Never  Vaping Use   Vaping status: Never Used  Substance and Sexual Activity   Alcohol use: No    Alcohol/week: 0.0 standard drinks of alcohol   Drug use: No   Sexual activity: Yes  Other Topics Concern   Not on file  Social History Narrative   Married. Education: [employer]. Exercise: Yes.   Right-handed.   Drinks coffee daily.   Lives at home with his wife.   Social Drivers of Health   Tobacco Use: Low Risk (04/06/2024)   Patient History    Smoking Tobacco Use: Never    Smokeless Tobacco Use: Never    Passive Exposure: Never  Recent Concern: Tobacco Use - High Risk (02/29/2024)   Received from [facility]   Patient History    Smoking Tobacco Use: Smoker, Current Status Unknown    Smokeless Tobacco Use: Never    Passive Exposure: Not on file  Financial Resource Strain: Low Risk (07/20/2023)   Overall Financial Resource Strain (CARDIA)    Difficulty of Paying Living Expenses: Not hard at all  Food Insecurity: No Food Insecurity  (07/20/2023)   Hunger Vital Sign    Worried About Running Out of Food in the Last Year: Never true    Ran Out of Food in the Last Year: Never true  Transportation Needs: No Transportation Needs (07/20/2023)   PRAPARE - Administrator, Civil Service (Medical): No    Lack of Transportation (Non-Medical): No  Physical Activity: Sufficiently Active (07/20/2023)   Exercise Vital Sign    Days of Exercise per Week: 4 days    Minutes of Exercise per Session: 50 min  Stress: No Stress Concern Present (07/20/2023)   [employer] of Occupational Health - Occupational Stress Questionnaire    Feeling  of Stress : Not at all  Social Connections: Moderately Isolated (07/20/2023)   Social Connection and Isolation Panel    Frequency of Communication with Friends and Family: More than three times a week    Frequency of Social Gatherings with Friends and Family: Once a week    Attends Religious Services: Never    Database Administrator or Organizations: No    Attends Banker Meetings: Never    Marital Status: Married  Catering Manager Violence: Not At Risk (07/20/2023)   Humiliation, Afraid, Rape, and Kick questionnaire    Fear of Current or Ex-Partner: No    Emotionally Abused: No    Physically Abused: No    Sexually Abused: No  Depression (PHQ2-9): Low Risk (02/17/2024)   Depression (PHQ2-9)    PHQ-2 Score: 0  Alcohol Screen: Low Risk (07/20/2023)   Alcohol Screen    Last Alcohol Screening Score (AUDIT): 0  Housing: Unknown (07/20/2023)   Housing Stability Vital Sign    Unable to Pay for Housing in the Last Year: No    Number of Times Moved in the Last Year: Not on file    Homeless in the Last Year: No  Utilities: Not At Risk (07/20/2023)   [facility] Utilities    Threatened with loss of utilities: No  Health Literacy: Adequate Health Literacy (07/20/2023)   B1300 Health Literacy    Frequency of need for help with medical instructions: Never    Family History  Problem  Relation Age of Onset   Cancer Father 46       pancreatic   Other Mother        age 47   Prostate cancer Brother    Colon cancer Neg Hx    Esophageal cancer Neg Hx    Liver cancer Neg Hx    Pancreatic cancer Neg Hx    Rectal cancer Neg Hx    Stomach cancer Neg Hx      Review of Systems  Constitutional: Negative.  Negative for chills and fever.  [name]: Negative.  Negative for congestion and sore throat.   Respiratory: Negative.  Negative for cough and shortness of breath.   Cardiovascular: Negative.  Negative for chest pain and palpitations.  Gastrointestinal:  Negative for abdominal pain, diarrhea, nausea and vomiting.  Genitourinary: Negative.  Negative for dysuria and hematuria.  Skin: Negative.  Negative for rash.  Neurological: Negative.  Negative for dizziness and headaches.  All other systems reviewed and are negative.   Vitals:   04/06/24 1038  BP: 130/64  Pulse: 82  Temp: 98 F (36.7 C)  SpO2: 98%    Physical Exam Vitals reviewed.  Constitutional:      Appearance: Normal appearance.  [name]:     Head: Normocephalic.     Mouth/Throat:     Mouth: Mucous membranes are moist.     Pharynx: Oropharynx is clear.  Eyes:     Extraocular Movements: Extraocular movements intact.     Conjunctiva/sclera: Conjunctivae normal.     Pupils: Pupils are equal, round, and reactive to light.  Cardiovascular:     Rate and Rhythm: Normal rate and regular rhythm.     Pulses: Normal pulses.     Heart sounds: Normal heart sounds.  Pulmonary:     Effort: Pulmonary effort is normal.     Breath sounds: Normal breath sounds.  Abdominal:     Palpations: Abdomen is soft.     Tenderness: There is no abdominal tenderness.  Musculoskeletal:  Cervical back: No tenderness.  Lymphadenopathy:     Cervical: No cervical adenopathy.  Skin:    General: Skin is warm and dry.     Capillary Refill: Capillary refill takes less than 2 seconds.  Neurological:     General: No focal deficit  present.     Mental Status: He is alert and oriented to person, place, and time.  Psychiatric:        Mood and Affect: Mood normal.        Behavior: Behavior normal.    Results for orders placed or performed in visit on 04/06/24 (from the past 24 hours)  POCT HgB A1C     Status: Abnormal   Collection Time: 04/06/24  1:00 PM  Result Value Ref Range   Hemoglobin A1C     HbA1c POC (<> result, manual entry) 6.1 4.0 - 5.6 %   HbA1c, POC (prediabetic range)     HbA1c, POC (controlled diabetic range)        ASSESSMENT & PLAN: Problem List Items Addressed This Visit       Cardiovascular and Mediastinum   Atherosclerosis of aorta   Continue simvastatin  20 mg daily         Respiratory   Simple chronic bronchitis (HCC)   Stable chronic condition with chronic symptoms including chronic cough.  No active infection.  No complications. Continue Breztri  twice a day        Endocrine   Dyslipidemia associated with type 2 diabetes mellitus (HCC) - Primary   Well-controlled diabetes with hemoglobin A1c of 6.1 Stopped Metformin , continues twice a day Humalog  insulin  15 units and daily Jardiance  10 mg Continue simvastatin  20 mg daily Diet and nutrition discussed      Relevant Orders   POCT HgB A1C   Vitamin B12   Comprehensive metabolic panel with GFR   Lipid panel   CBC with Differential/Platelet     Other   Paresthesia of left thumb   Clinically stable.  No red flag signs or symptoms. Differential diagnosis discussed Most likely related to diabetic condition No concerns      Relevant Orders   Vitamin B12   Comprehensive metabolic panel with GFR   Lipid panel   CBC with Differential/Platelet   Patient Instructions  Health Maintenance After Age 53 After age 8, you are at a higher risk for certain long-term diseases and infections as well as injuries from falls. Falls are a major cause of broken bones and head injuries in people who are older than age 66. Getting regular  preventive care can help to keep you healthy and well. Preventive care includes getting regular testing and making lifestyle changes as recommended by your health care provider. Talk with your health care provider about: Which screenings and tests you should have. A screening is a test that checks for a disease when you have no symptoms. A diet and exercise plan that is right for you. What should I know about screenings and tests to prevent falls? Screening and testing are the best ways to find a health problem early. Early diagnosis and treatment give you the best chance of managing medical conditions that are common after age 41. Certain conditions and lifestyle choices may make you more likely to have a fall. Your health care provider may recommend: Regular vision checks. Poor vision and conditions such as cataracts can make you more likely to have a fall. If you wear glasses, make sure to get your prescription updated if your vision  changes. Medicine review. Work with your health care provider to regularly review all of the medicines you are taking, including over-the-counter medicines. Ask your health care provider about any side effects that may make you more likely to have a fall. Tell your health care provider if any medicines that you take make you feel dizzy or sleepy. Strength and balance checks. Your health care provider may recommend certain tests to check your strength and balance while standing, walking, or changing positions. Foot health exam. Foot pain and numbness, as well as not wearing proper footwear, can make you more likely to have a fall. Screenings, including: Osteoporosis screening. Osteoporosis is a condition that causes the bones to get weaker and break more easily. Blood pressure screening. Blood pressure changes and medicines to control blood pressure can make you feel dizzy. Depression screening. You may be more likely to have a fall if you have a fear of falling, feel  depressed, or feel unable to do activities that you used to do. Alcohol use screening. Using too much alcohol can affect your balance and may make you more likely to have a fall. Follow these instructions at home: Lifestyle Do not drink alcohol if: Your health care provider tells you not to drink. If you drink alcohol: Limit how much you have to: 0-1 drink a day for women. 0-2 drinks a day for men. Know how much alcohol is in your drink. In the [place] one drink equals one 12 oz bottle of beer (355 mL), one 5 oz glass of wine (148 mL), or one 1 oz glass of hard liquor (44 mL). Do not use any products that contain nicotine or tobacco. These products include cigarettes, chewing tobacco, and vaping devices, such as e-cigarettes. If you need help quitting, ask your health care provider. Activity  Follow a regular exercise program to stay fit. This will help you maintain your balance. Ask your health care provider what types of exercise are appropriate for you. If you need a cane or walker, use it as recommended by your health care provider. Wear supportive shoes that have nonskid soles. Safety  Remove any tripping hazards, such as rugs, cords, and clutter. Install safety equipment such as grab bars in bathrooms and safety rails on stairs. Keep rooms and walkways well-lit. General instructions Talk with your health care provider about your risks for falling. Tell your health care provider if: You fall. Be sure to tell your health care provider about all falls, even ones that seem minor. You feel dizzy, tiredness (fatigue), or off-balance. Take over-the-counter and prescription medicines only as told by your health care provider. These include supplements. Eat a healthy diet and maintain a healthy weight. A healthy diet includes low-fat dairy products, low-fat (lean) meats, and fiber from whole grains, beans, and lots of fruits and vegetables. Stay current with your vaccines. Schedule regular  health, dental, and eye exams. Summary Having a healthy lifestyle and getting preventive care can help to protect your health and wellness after age 67. Screening and testing are the best way to find a health problem early and help you avoid having a fall. Early diagnosis and treatment give you the best chance for managing medical conditions that are more common for people who are older than age 61. Falls are a major cause of broken bones and head injuries in people who are older than age 6. Take precautions to prevent a fall at home. Work with your health care provider to learn what changes you can  make to improve your health and wellness and to prevent falls. This information is not intended to replace advice given to you by your health care provider. Make sure you discuss any questions you have with your health care provider. Document Revised: 08/06/2020 Document Reviewed: 08/06/2020 Elsevier Patient Education  2024 [employer]      Emil Schaumann, MD [facility]     [1] No Known Allergies

## 2024-04-06 NOTE — Assessment & Plan Note (Signed)
 Stable chronic condition with chronic symptoms including chronic cough.  No active infection.  No complications. Continue Breztri  twice a day

## 2024-04-06 NOTE — Assessment & Plan Note (Signed)
 Clinically stable.  No red flag signs or symptoms. Differential diagnosis discussed Most likely related to diabetic condition No concerns

## 2024-04-06 NOTE — Assessment & Plan Note (Signed)
"  Continue simvastatin  20 mg daily  "

## 2024-04-06 NOTE — Assessment & Plan Note (Signed)
 Well-controlled diabetes with hemoglobin A1c of 6.1 Stopped Metformin , continues twice a day Humalog  insulin  15 units and daily Jardiance  10 mg Continue simvastatin  20 mg daily Diet and nutrition discussed

## 2024-08-04 ENCOUNTER — Ambulatory Visit

## 2024-08-04 ENCOUNTER — Encounter: Admitting: Emergency Medicine
